# Patient Record
Sex: Female | Born: 1950 | Race: White | Hispanic: No | Smoking: Never smoker
Health system: Southern US, Community
[De-identification: ages and names within clinical notes are randomized; demographics above are authoritative.]

## PROBLEM LIST (undated history)

## (undated) DIAGNOSIS — R5381 Other malaise: Secondary | ICD-10-CM

## (undated) DIAGNOSIS — R112 Nausea with vomiting, unspecified: Secondary | ICD-10-CM

## (undated) DIAGNOSIS — G8929 Other chronic pain: Secondary | ICD-10-CM

## (undated) DIAGNOSIS — R5383 Other fatigue: Secondary | ICD-10-CM

## (undated) DIAGNOSIS — M545 Low back pain, unspecified: Secondary | ICD-10-CM

## (undated) DIAGNOSIS — Z9289 Personal history of other medical treatment: Secondary | ICD-10-CM

## (undated) DIAGNOSIS — T4145XA Adverse effect of unspecified anesthetic, initial encounter: Secondary | ICD-10-CM

## (undated) DIAGNOSIS — F325 Major depressive disorder, single episode, in full remission: Secondary | ICD-10-CM

## (undated) DIAGNOSIS — T8859XA Other complications of anesthesia, initial encounter: Secondary | ICD-10-CM

## (undated) DIAGNOSIS — J449 Chronic obstructive pulmonary disease, unspecified: Secondary | ICD-10-CM

## (undated) DIAGNOSIS — E782 Mixed hyperlipidemia: Secondary | ICD-10-CM

## (undated) DIAGNOSIS — R4182 Altered mental status, unspecified: Secondary | ICD-10-CM

## (undated) DIAGNOSIS — Z9889 Other specified postprocedural states: Secondary | ICD-10-CM

## (undated) DIAGNOSIS — R42 Dizziness and giddiness: Secondary | ICD-10-CM

## (undated) DIAGNOSIS — M797 Fibromyalgia: Secondary | ICD-10-CM

## (undated) DIAGNOSIS — K3184 Gastroparesis: Secondary | ICD-10-CM

## (undated) DIAGNOSIS — D131 Benign neoplasm of stomach: Secondary | ICD-10-CM

## (undated) DIAGNOSIS — F419 Anxiety disorder, unspecified: Secondary | ICD-10-CM

## (undated) DIAGNOSIS — N189 Chronic kidney disease, unspecified: Secondary | ICD-10-CM

## (undated) DIAGNOSIS — H3530 Unspecified macular degeneration: Secondary | ICD-10-CM

## (undated) DIAGNOSIS — Z8673 Personal history of transient ischemic attack (TIA), and cerebral infarction without residual deficits: Secondary | ICD-10-CM

## (undated) DIAGNOSIS — R51 Headache: Secondary | ICD-10-CM

## (undated) DIAGNOSIS — Z8489 Family history of other specified conditions: Secondary | ICD-10-CM

## (undated) DIAGNOSIS — K219 Gastro-esophageal reflux disease without esophagitis: Secondary | ICD-10-CM

## (undated) DIAGNOSIS — D649 Anemia, unspecified: Secondary | ICD-10-CM

## (undated) DIAGNOSIS — I2510 Atherosclerotic heart disease of native coronary artery without angina pectoris: Secondary | ICD-10-CM

## (undated) DIAGNOSIS — N3946 Mixed incontinence: Secondary | ICD-10-CM

## (undated) DIAGNOSIS — I350 Nonrheumatic aortic (valve) stenosis: Secondary | ICD-10-CM

## (undated) DIAGNOSIS — G4733 Obstructive sleep apnea (adult) (pediatric): Secondary | ICD-10-CM

## (undated) DIAGNOSIS — I6529 Occlusion and stenosis of unspecified carotid artery: Secondary | ICD-10-CM

## (undated) DIAGNOSIS — E669 Obesity, unspecified: Secondary | ICD-10-CM

## (undated) DIAGNOSIS — I509 Heart failure, unspecified: Secondary | ICD-10-CM

## (undated) DIAGNOSIS — C569 Malignant neoplasm of unspecified ovary: Secondary | ICD-10-CM

## (undated) DIAGNOSIS — E119 Type 2 diabetes mellitus without complications: Secondary | ICD-10-CM

## (undated) HISTORY — DX: Altered mental status, unspecified: R41.82

## (undated) HISTORY — PX: ANTERIOR AND POSTERIOR VAGINAL REPAIR: SUR5

## (undated) HISTORY — DX: Gastroparesis: K31.84

## (undated) HISTORY — DX: Nonrheumatic aortic (valve) stenosis: I35.0

## (undated) HISTORY — DX: Other malaise: R53.81

## (undated) HISTORY — PX: TUBAL LIGATION: SHX77

## (undated) HISTORY — DX: Headache: R51

## (undated) HISTORY — DX: Benign neoplasm of stomach: D13.1

## (undated) HISTORY — PX: BAND HEMORRHOIDECTOMY: SHX1213

## (undated) HISTORY — PX: ABDOMINAL HYSTERECTOMY: SHX81

## (undated) HISTORY — PX: CHOLECYSTECTOMY OPEN: SUR202

## (undated) HISTORY — DX: Major depressive disorder, single episode, in full remission: F32.5

## (undated) HISTORY — DX: Occlusion and stenosis of unspecified carotid artery: I65.29

## (undated) HISTORY — DX: Mixed hyperlipidemia: E78.2

## (undated) HISTORY — PX: OOPHORECTOMY: SHX86

## (undated) HISTORY — PX: TONSILLECTOMY: SUR1361

## (undated) HISTORY — DX: Obesity, unspecified: E66.9

## (undated) HISTORY — DX: Mixed incontinence: N39.46

## (undated) HISTORY — PX: ESOPHAGOGASTRODUODENOSCOPY (EGD) WITH ESOPHAGEAL DILATION: SHX5812

## (undated) HISTORY — DX: Other chronic pain: G89.29

## (undated) HISTORY — DX: Unspecified macular degeneration: H35.30

## (undated) HISTORY — PX: APPENDECTOMY: SHX54

## (undated) HISTORY — DX: Dizziness and giddiness: R42

## (undated) HISTORY — DX: Malignant neoplasm of unspecified ovary: C56.9

## (undated) HISTORY — DX: Gastro-esophageal reflux disease without esophagitis: K21.9

## (undated) HISTORY — PX: BACK SURGERY: SHX140

## (undated) HISTORY — DX: Other fatigue: R53.83

## (undated) HISTORY — DX: Obstructive sleep apnea (adult) (pediatric): G47.33

## (undated) HISTORY — DX: Personal history of transient ischemic attack (TIA), and cerebral infarction without residual deficits: Z86.73

## (undated) HISTORY — DX: Atherosclerotic heart disease of native coronary artery without angina pectoris: I25.10

## (undated) HISTORY — PX: LUMBAR DISC SURGERY: SHX700

## (undated) HISTORY — PX: DILATION AND CURETTAGE OF UTERUS: SHX78

## (undated) HISTORY — PX: KNEE ARTHROSCOPY: SUR90

---
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
## 1998-09-13 ENCOUNTER — Encounter: Payer: Self-pay | Admitting: Emergency Medicine

## 1998-09-13 ENCOUNTER — Emergency Department (HOSPITAL_COMMUNITY): Admission: EM | Admit: 1998-09-13 | Discharge: 1998-09-13 | Payer: Self-pay | Admitting: Emergency Medicine

## 1999-02-09 ENCOUNTER — Encounter: Payer: Self-pay | Admitting: Internal Medicine

## 1999-02-09 ENCOUNTER — Emergency Department (HOSPITAL_COMMUNITY): Admission: EM | Admit: 1999-02-09 | Discharge: 1999-02-09 | Payer: Self-pay | Admitting: Internal Medicine

## 2000-03-21 ENCOUNTER — Encounter: Admission: RE | Admit: 2000-03-21 | Discharge: 2000-03-21 | Payer: Self-pay | Admitting: Internal Medicine

## 2000-04-13 ENCOUNTER — Emergency Department (HOSPITAL_COMMUNITY): Admission: EM | Admit: 2000-04-13 | Discharge: 2000-04-13 | Payer: Self-pay | Admitting: Emergency Medicine

## 2000-05-01 ENCOUNTER — Encounter: Admission: RE | Admit: 2000-05-01 | Discharge: 2000-05-01 | Payer: Self-pay | Admitting: Internal Medicine

## 2000-06-07 ENCOUNTER — Encounter: Payer: Self-pay | Admitting: *Deleted

## 2000-06-07 ENCOUNTER — Ambulatory Visit (HOSPITAL_COMMUNITY): Admission: RE | Admit: 2000-06-07 | Discharge: 2000-06-07 | Payer: Self-pay | Admitting: *Deleted

## 2001-06-14 ENCOUNTER — Emergency Department (HOSPITAL_COMMUNITY): Admission: EM | Admit: 2001-06-14 | Discharge: 2001-06-14 | Payer: Self-pay | Admitting: Emergency Medicine

## 2001-06-14 ENCOUNTER — Encounter: Payer: Self-pay | Admitting: Emergency Medicine

## 2001-09-23 ENCOUNTER — Ambulatory Visit (HOSPITAL_COMMUNITY): Admission: RE | Admit: 2001-09-23 | Discharge: 2001-09-23 | Payer: Self-pay | Admitting: Internal Medicine

## 2001-09-23 ENCOUNTER — Encounter: Payer: Self-pay | Admitting: Internal Medicine

## 2001-10-22 ENCOUNTER — Ambulatory Visit (HOSPITAL_COMMUNITY): Admission: RE | Admit: 2001-10-22 | Discharge: 2001-10-22 | Payer: Self-pay | Admitting: Internal Medicine

## 2001-10-22 ENCOUNTER — Encounter: Payer: Self-pay | Admitting: Internal Medicine

## 2002-01-02 ENCOUNTER — Emergency Department (HOSPITAL_COMMUNITY): Admission: EM | Admit: 2002-01-02 | Discharge: 2002-01-02 | Payer: Self-pay | Admitting: Emergency Medicine

## 2002-02-27 ENCOUNTER — Other Ambulatory Visit: Admission: RE | Admit: 2002-02-27 | Discharge: 2002-02-27 | Payer: Self-pay | Admitting: Internal Medicine

## 2002-03-19 ENCOUNTER — Ambulatory Visit (HOSPITAL_COMMUNITY): Admission: RE | Admit: 2002-03-19 | Discharge: 2002-03-19 | Payer: Self-pay | Admitting: Internal Medicine

## 2002-03-19 ENCOUNTER — Encounter: Payer: Self-pay | Admitting: Internal Medicine

## 2002-03-26 ENCOUNTER — Encounter: Payer: Self-pay | Admitting: Internal Medicine

## 2002-03-26 ENCOUNTER — Ambulatory Visit (HOSPITAL_COMMUNITY): Admission: RE | Admit: 2002-03-26 | Discharge: 2002-03-26 | Payer: Self-pay | Admitting: Internal Medicine

## 2002-04-10 ENCOUNTER — Ambulatory Visit (HOSPITAL_COMMUNITY): Admission: RE | Admit: 2002-04-10 | Discharge: 2002-04-10 | Payer: Self-pay | Admitting: Internal Medicine

## 2002-04-10 ENCOUNTER — Encounter: Payer: Self-pay | Admitting: Internal Medicine

## 2002-05-18 ENCOUNTER — Emergency Department (HOSPITAL_COMMUNITY): Admission: EM | Admit: 2002-05-18 | Discharge: 2002-05-19 | Payer: Self-pay | Admitting: Emergency Medicine

## 2002-09-03 ENCOUNTER — Encounter: Admission: RE | Admit: 2002-09-03 | Discharge: 2002-11-07 | Payer: Self-pay | Admitting: Internal Medicine

## 2002-09-08 ENCOUNTER — Encounter: Payer: Self-pay | Admitting: Internal Medicine

## 2002-09-08 ENCOUNTER — Ambulatory Visit (HOSPITAL_COMMUNITY): Admission: RE | Admit: 2002-09-08 | Discharge: 2002-09-08 | Payer: Self-pay

## 2003-03-22 ENCOUNTER — Emergency Department (HOSPITAL_COMMUNITY): Admission: EM | Admit: 2003-03-22 | Discharge: 2003-03-22 | Payer: Self-pay | Admitting: Emergency Medicine

## 2003-06-10 ENCOUNTER — Ambulatory Visit (HOSPITAL_COMMUNITY): Admission: RE | Admit: 2003-06-10 | Discharge: 2003-06-10 | Payer: Self-pay | Admitting: Family Medicine

## 2003-09-08 ENCOUNTER — Observation Stay (HOSPITAL_COMMUNITY): Admission: RE | Admit: 2003-09-08 | Discharge: 2003-09-09 | Payer: Self-pay | Admitting: Cardiology

## 2003-10-20 ENCOUNTER — Encounter: Payer: Self-pay | Admitting: Gastroenterology

## 2003-11-22 ENCOUNTER — Emergency Department (HOSPITAL_COMMUNITY): Admission: EM | Admit: 2003-11-22 | Discharge: 2003-11-22 | Payer: Self-pay | Admitting: Emergency Medicine

## 2004-04-21 ENCOUNTER — Ambulatory Visit (HOSPITAL_COMMUNITY): Admission: RE | Admit: 2004-04-21 | Discharge: 2004-04-21 | Payer: Self-pay | Admitting: Internal Medicine

## 2004-05-05 ENCOUNTER — Ambulatory Visit: Payer: Self-pay | Admitting: *Deleted

## 2004-05-26 ENCOUNTER — Ambulatory Visit: Payer: Self-pay | Admitting: Internal Medicine

## 2004-07-06 ENCOUNTER — Ambulatory Visit: Payer: Self-pay | Admitting: Internal Medicine

## 2004-07-11 ENCOUNTER — Ambulatory Visit: Payer: Self-pay | Admitting: Nurse Practitioner

## 2004-08-01 ENCOUNTER — Ambulatory Visit: Payer: Self-pay | Admitting: Internal Medicine

## 2004-08-08 ENCOUNTER — Ambulatory Visit: Payer: Self-pay | Admitting: Internal Medicine

## 2004-08-23 ENCOUNTER — Ambulatory Visit: Payer: Self-pay | Admitting: Cardiology

## 2004-08-25 ENCOUNTER — Ambulatory Visit (HOSPITAL_COMMUNITY): Admission: RE | Admit: 2004-08-25 | Discharge: 2004-08-25 | Payer: Self-pay | Admitting: Internal Medicine

## 2004-09-08 ENCOUNTER — Ambulatory Visit: Payer: Self-pay | Admitting: Internal Medicine

## 2004-09-26 ENCOUNTER — Ambulatory Visit: Payer: Self-pay | Admitting: Gastroenterology

## 2004-09-27 ENCOUNTER — Ambulatory Visit: Payer: Self-pay | Admitting: Gastroenterology

## 2004-10-04 ENCOUNTER — Ambulatory Visit: Payer: Self-pay | Admitting: Gastroenterology

## 2004-10-25 ENCOUNTER — Ambulatory Visit: Payer: Self-pay | Admitting: Family Medicine

## 2004-11-15 ENCOUNTER — Ambulatory Visit: Payer: Self-pay | Admitting: Gastroenterology

## 2005-02-06 ENCOUNTER — Ambulatory Visit: Payer: Self-pay | Admitting: Internal Medicine

## 2005-02-15 ENCOUNTER — Ambulatory Visit (HOSPITAL_COMMUNITY): Admission: RE | Admit: 2005-02-15 | Discharge: 2005-02-15 | Payer: Self-pay | Admitting: Internal Medicine

## 2005-02-27 ENCOUNTER — Ambulatory Visit: Payer: Self-pay | Admitting: Internal Medicine

## 2005-03-01 ENCOUNTER — Ambulatory Visit (HOSPITAL_COMMUNITY): Admission: RE | Admit: 2005-03-01 | Discharge: 2005-03-01 | Payer: Self-pay | Admitting: Internal Medicine

## 2005-03-20 ENCOUNTER — Ambulatory Visit: Payer: Self-pay | Admitting: Internal Medicine

## 2005-06-01 ENCOUNTER — Ambulatory Visit: Payer: Self-pay | Admitting: Internal Medicine

## 2005-06-01 ENCOUNTER — Ambulatory Visit: Payer: Self-pay

## 2005-06-26 ENCOUNTER — Ambulatory Visit: Payer: Self-pay | Admitting: Internal Medicine

## 2005-07-06 ENCOUNTER — Ambulatory Visit: Payer: Self-pay | Admitting: Gastroenterology

## 2005-07-07 ENCOUNTER — Ambulatory Visit: Payer: Self-pay | Admitting: Internal Medicine

## 2005-07-17 ENCOUNTER — Ambulatory Visit: Payer: Self-pay | Admitting: Internal Medicine

## 2005-09-02 ENCOUNTER — Ambulatory Visit: Payer: Self-pay | Admitting: Internal Medicine

## 2005-09-18 ENCOUNTER — Ambulatory Visit: Payer: Self-pay | Admitting: Internal Medicine

## 2005-09-25 ENCOUNTER — Ambulatory Visit: Payer: Self-pay | Admitting: Gastroenterology

## 2005-10-02 ENCOUNTER — Ambulatory Visit (HOSPITAL_COMMUNITY): Admission: RE | Admit: 2005-10-02 | Discharge: 2005-10-02 | Payer: Self-pay | Admitting: Gastroenterology

## 2005-10-02 ENCOUNTER — Ambulatory Visit: Payer: Self-pay | Admitting: Internal Medicine

## 2005-10-05 ENCOUNTER — Ambulatory Visit: Payer: Self-pay | Admitting: Internal Medicine

## 2005-10-08 ENCOUNTER — Emergency Department (HOSPITAL_COMMUNITY): Admission: EM | Admit: 2005-10-08 | Discharge: 2005-10-08 | Payer: Self-pay | Admitting: Emergency Medicine

## 2005-11-06 ENCOUNTER — Ambulatory Visit: Payer: Self-pay | Admitting: Gastroenterology

## 2005-12-06 ENCOUNTER — Ambulatory Visit: Payer: Self-pay | Admitting: Gastroenterology

## 2005-12-13 ENCOUNTER — Ambulatory Visit: Payer: Self-pay | Admitting: Gastroenterology

## 2005-12-13 ENCOUNTER — Encounter (INDEPENDENT_AMBULATORY_CARE_PROVIDER_SITE_OTHER): Payer: Self-pay | Admitting: Specialist

## 2006-01-12 ENCOUNTER — Ambulatory Visit: Payer: Self-pay | Admitting: Gastroenterology

## 2006-03-19 ENCOUNTER — Ambulatory Visit: Payer: Self-pay | Admitting: Internal Medicine

## 2006-05-28 ENCOUNTER — Ambulatory Visit: Payer: Self-pay | Admitting: Internal Medicine

## 2006-06-01 ENCOUNTER — Ambulatory Visit: Payer: Self-pay | Admitting: Internal Medicine

## 2006-07-17 ENCOUNTER — Ambulatory Visit: Payer: Self-pay | Admitting: Gastroenterology

## 2006-07-24 ENCOUNTER — Ambulatory Visit: Payer: Self-pay | Admitting: Internal Medicine

## 2006-08-08 ENCOUNTER — Ambulatory Visit: Payer: Self-pay | Admitting: Internal Medicine

## 2006-09-06 ENCOUNTER — Ambulatory Visit: Payer: Self-pay | Admitting: Internal Medicine

## 2006-12-13 ENCOUNTER — Ambulatory Visit: Payer: Self-pay | Admitting: Internal Medicine

## 2006-12-17 ENCOUNTER — Ambulatory Visit: Payer: Self-pay | Admitting: Internal Medicine

## 2006-12-17 ENCOUNTER — Ambulatory Visit (HOSPITAL_COMMUNITY): Admission: RE | Admit: 2006-12-17 | Discharge: 2006-12-17 | Payer: Self-pay | Admitting: Internal Medicine

## 2006-12-25 ENCOUNTER — Ambulatory Visit: Payer: Self-pay | Admitting: Internal Medicine

## 2006-12-25 LAB — CONVERTED CEMR LAB
Cholesterol: 159 mg/dL (ref 0–200)
Triglycerides: 218 mg/dL (ref 0–149)

## 2006-12-28 ENCOUNTER — Ambulatory Visit: Payer: Self-pay | Admitting: Internal Medicine

## 2007-01-04 ENCOUNTER — Ambulatory Visit: Payer: Self-pay

## 2007-01-11 ENCOUNTER — Ambulatory Visit (HOSPITAL_COMMUNITY): Admission: RE | Admit: 2007-01-11 | Discharge: 2007-01-11 | Payer: Self-pay | Admitting: Internal Medicine

## 2007-01-16 ENCOUNTER — Ambulatory Visit: Payer: Self-pay | Admitting: Gastroenterology

## 2007-02-28 ENCOUNTER — Ambulatory Visit: Payer: Self-pay | Admitting: Internal Medicine

## 2007-03-04 ENCOUNTER — Ambulatory Visit (HOSPITAL_COMMUNITY): Admission: RE | Admit: 2007-03-04 | Discharge: 2007-03-04 | Payer: Self-pay | Admitting: Internal Medicine

## 2007-03-05 ENCOUNTER — Ambulatory Visit: Payer: Self-pay | Admitting: Internal Medicine

## 2007-03-11 ENCOUNTER — Ambulatory Visit: Payer: Self-pay | Admitting: Internal Medicine

## 2007-03-18 ENCOUNTER — Encounter: Admission: RE | Admit: 2007-03-18 | Discharge: 2007-03-18 | Payer: Self-pay | Admitting: Surgery

## 2007-05-03 ENCOUNTER — Ambulatory Visit (HOSPITAL_COMMUNITY): Admission: RE | Admit: 2007-05-03 | Discharge: 2007-05-03 | Payer: Self-pay | Admitting: Surgery

## 2007-05-06 ENCOUNTER — Ambulatory Visit: Payer: Self-pay | Admitting: Internal Medicine

## 2007-05-20 ENCOUNTER — Ambulatory Visit (HOSPITAL_COMMUNITY): Admission: RE | Admit: 2007-05-20 | Discharge: 2007-05-20 | Payer: Self-pay | Admitting: Surgery

## 2007-05-20 ENCOUNTER — Encounter (INDEPENDENT_AMBULATORY_CARE_PROVIDER_SITE_OTHER): Payer: Self-pay | Admitting: Surgery

## 2007-06-05 ENCOUNTER — Ambulatory Visit: Payer: Self-pay | Admitting: Gastroenterology

## 2007-06-05 LAB — CONVERTED CEMR LAB
ALT: 36 units/L — ABNORMAL HIGH (ref 0–35)
Albumin: 3.5 g/dL (ref 3.5–5.2)
Alkaline Phosphatase: 59 units/L (ref 39–117)
HCV Ab: POSITIVE — AB
Hep B S Ab: NEGATIVE
Total Bilirubin: 0.5 mg/dL (ref 0.3–1.2)
Total Protein: 7.1 g/dL (ref 6.0–8.3)

## 2007-06-21 ENCOUNTER — Ambulatory Visit: Payer: Self-pay | Admitting: Gastroenterology

## 2007-08-12 ENCOUNTER — Ambulatory Visit: Payer: Self-pay | Admitting: Internal Medicine

## 2007-08-12 LAB — CONVERTED CEMR LAB
AST: 79 units/L — ABNORMAL HIGH (ref 0–37)
Cholesterol: 167 mg/dL (ref 0–200)
Triglycerides: 191 mg/dL — ABNORMAL HIGH (ref 0–149)
VLDL: 38 mg/dL (ref 0–40)

## 2007-09-02 ENCOUNTER — Ambulatory Visit: Payer: Self-pay | Admitting: Internal Medicine

## 2007-09-11 ENCOUNTER — Ambulatory Visit: Payer: Self-pay | Admitting: Gastroenterology

## 2007-09-11 LAB — CONVERTED CEMR LAB: Creatinine, Ser: 0.7 mg/dL (ref 0.4–1.2)

## 2007-09-16 ENCOUNTER — Ambulatory Visit: Payer: Self-pay | Admitting: Internal Medicine

## 2007-09-20 ENCOUNTER — Ambulatory Visit (HOSPITAL_COMMUNITY): Admission: RE | Admit: 2007-09-20 | Discharge: 2007-09-20 | Payer: Self-pay | Admitting: Gastroenterology

## 2007-10-02 ENCOUNTER — Ambulatory Visit: Payer: Self-pay | Admitting: Gastroenterology

## 2007-10-02 DIAGNOSIS — K3184 Gastroparesis: Secondary | ICD-10-CM | POA: Insufficient documentation

## 2007-10-02 DIAGNOSIS — K219 Gastro-esophageal reflux disease without esophagitis: Secondary | ICD-10-CM | POA: Insufficient documentation

## 2007-10-02 DIAGNOSIS — B182 Chronic viral hepatitis C: Secondary | ICD-10-CM | POA: Insufficient documentation

## 2007-10-02 DIAGNOSIS — R1310 Dysphagia, unspecified: Secondary | ICD-10-CM | POA: Insufficient documentation

## 2007-10-03 ENCOUNTER — Ambulatory Visit: Payer: Self-pay | Admitting: Internal Medicine

## 2007-10-03 LAB — CONVERTED CEMR LAB
Albumin: 4.4 g/dL (ref 3.5–5.2)
Alkaline Phosphatase: 73 units/L (ref 39–117)
CO2: 26 meq/L (ref 19–32)
Chloride: 103 meq/L (ref 96–112)
Cholesterol: 170 mg/dL (ref 0–200)
Creatinine, Ser: 0.76 mg/dL (ref 0.40–1.20)
Eosinophils Absolute: 0.2 10*3/uL (ref 0.0–0.7)
HDL: 37 mg/dL — ABNORMAL LOW (ref >39)
LDL Cholesterol: 89 mg/dL (ref 0–99)
Lymphocytes Relative: 30 % (ref 12–46)
MCHC: 32.1 g/dL (ref 30.0–36.0)
Monocytes Absolute: 0.4 10*3/uL (ref 0.1–1.0)
Monocytes Relative: 7 % (ref 3–12)
Sodium: 143 meq/L (ref 135–145)
Total CHOL/HDL Ratio: 4.6
Triglycerides: 220 mg/dL — ABNORMAL HIGH (ref <150)
VLDL: 44 mg/dL — ABNORMAL HIGH (ref 0–40)
WBC: 5.8 10*3/uL (ref 4.0–10.5)

## 2007-10-08 ENCOUNTER — Ambulatory Visit: Payer: Self-pay | Admitting: Internal Medicine

## 2007-10-08 LAB — CONVERTED CEMR LAB
Chlamydia, Swab/Urine, PCR: NEGATIVE
GC Probe Amp, Urine: NEGATIVE

## 2007-10-09 ENCOUNTER — Encounter: Payer: Self-pay | Admitting: Gastroenterology

## 2007-10-09 ENCOUNTER — Ambulatory Visit: Payer: Self-pay | Admitting: Gastroenterology

## 2007-10-28 ENCOUNTER — Emergency Department (HOSPITAL_COMMUNITY): Admission: EM | Admit: 2007-10-28 | Discharge: 2007-10-29 | Payer: Self-pay | Admitting: Emergency Medicine

## 2007-11-20 ENCOUNTER — Ambulatory Visit: Payer: Self-pay | Admitting: Gastroenterology

## 2008-01-08 ENCOUNTER — Telehealth: Payer: Self-pay | Admitting: Gastroenterology

## 2008-01-28 ENCOUNTER — Ambulatory Visit: Payer: Self-pay | Admitting: Internal Medicine

## 2008-02-04 ENCOUNTER — Ambulatory Visit (HOSPITAL_COMMUNITY): Admission: RE | Admit: 2008-02-04 | Discharge: 2008-02-04 | Payer: Self-pay | Admitting: Family Medicine

## 2008-02-25 ENCOUNTER — Telehealth: Payer: Self-pay | Admitting: Gastroenterology

## 2008-02-27 ENCOUNTER — Ambulatory Visit (HOSPITAL_COMMUNITY): Admission: RE | Admit: 2008-02-27 | Discharge: 2008-02-27 | Payer: Self-pay | Admitting: Internal Medicine

## 2008-02-27 ENCOUNTER — Ambulatory Visit: Payer: Self-pay | Admitting: Internal Medicine

## 2008-03-09 ENCOUNTER — Ambulatory Visit: Payer: Self-pay

## 2008-03-20 ENCOUNTER — Ambulatory Visit: Payer: Self-pay | Admitting: Internal Medicine

## 2008-07-21 ENCOUNTER — Telehealth: Payer: Self-pay | Admitting: Gastroenterology

## 2008-07-27 ENCOUNTER — Ambulatory Visit: Payer: Self-pay | Admitting: Gastroenterology

## 2008-07-27 DIAGNOSIS — R197 Diarrhea, unspecified: Secondary | ICD-10-CM | POA: Insufficient documentation

## 2008-07-27 DIAGNOSIS — R358 Other polyuria: Secondary | ICD-10-CM

## 2008-07-27 DIAGNOSIS — R3589 Other polyuria: Secondary | ICD-10-CM | POA: Insufficient documentation

## 2008-07-27 LAB — CONVERTED CEMR LAB
Bilirubin Urine: NEGATIVE
Crystals: NEGATIVE
Hemoglobin, Urine: NEGATIVE
Ketones, ur: NEGATIVE mg/dL
Leukocytes, UA: NEGATIVE
Mucus, UA: NEGATIVE
Nitrite: NEGATIVE
RBC / HPF: NONE SEEN
Specific Gravity, Urine: <=1.005 (ref 1.000–1.03)
Total Protein, Urine: NEGATIVE mg/dL
Urine Glucose: 500 mg/dL — AB
Urobilinogen, UA: 0.2 (ref 0.0–1.0)
pH: 7 (ref 5.0–8.0)

## 2008-07-31 ENCOUNTER — Ambulatory Visit: Payer: Self-pay | Admitting: Internal Medicine

## 2008-08-03 ENCOUNTER — Telehealth: Payer: Self-pay | Admitting: Gastroenterology

## 2008-08-06 ENCOUNTER — Telehealth: Payer: Self-pay | Admitting: Gastroenterology

## 2008-08-07 ENCOUNTER — Ambulatory Visit: Payer: Self-pay | Admitting: Internal Medicine

## 2008-08-24 ENCOUNTER — Telehealth: Payer: Self-pay | Admitting: Gastroenterology

## 2008-08-25 ENCOUNTER — Ambulatory Visit: Payer: Self-pay | Admitting: Internal Medicine

## 2008-08-25 DIAGNOSIS — B370 Candidal stomatitis: Secondary | ICD-10-CM | POA: Insufficient documentation

## 2008-09-14 ENCOUNTER — Ambulatory Visit: Payer: Self-pay | Admitting: Gastroenterology

## 2008-09-15 ENCOUNTER — Telehealth: Payer: Self-pay | Admitting: Gastroenterology

## 2008-09-17 ENCOUNTER — Telehealth: Payer: Self-pay | Admitting: Gastroenterology

## 2008-09-18 ENCOUNTER — Ambulatory Visit: Payer: Self-pay | Admitting: Internal Medicine

## 2008-09-18 LAB — CONVERTED CEMR LAB
Basophils Relative: 0.6 % (ref 0.0–3.0)
Chloride: 106 meq/L (ref 96–112)
Cortisol, Plasma: 6 ug/dL
Creatinine, Ser: 0.8 mg/dL (ref 0.4–1.2)
Eosinophils Absolute: 0.2 10*3/uL (ref 0.0–0.7)
HCT: 37.9 % (ref 36.0–46.0)
Hemoglobin: 13.1 g/dL (ref 12.0–15.0)
MCV: 88.7 fL (ref 78.0–100.0)
Monocytes Relative: 7.3 % (ref 3.0–12.0)
Neutro Abs: 3.1 10*3/uL (ref 1.4–7.7)
Sodium: 140 meq/L (ref 135–145)
TSH: 1.12 microintl units/mL (ref 0.35–5.50)

## 2008-10-21 ENCOUNTER — Ambulatory Visit: Payer: Self-pay | Admitting: Internal Medicine

## 2008-10-23 ENCOUNTER — Ambulatory Visit: Payer: Self-pay | Admitting: Internal Medicine

## 2008-11-18 DIAGNOSIS — R42 Dizziness and giddiness: Secondary | ICD-10-CM | POA: Insufficient documentation

## 2008-11-20 ENCOUNTER — Ambulatory Visit: Payer: Self-pay | Admitting: Internal Medicine

## 2008-12-01 ENCOUNTER — Ambulatory Visit (HOSPITAL_BASED_OUTPATIENT_CLINIC_OR_DEPARTMENT_OTHER): Admission: RE | Admit: 2008-12-01 | Discharge: 2008-12-01 | Payer: Self-pay | Admitting: Urology

## 2008-12-02 ENCOUNTER — Ambulatory Visit: Payer: Self-pay | Admitting: Internal Medicine

## 2008-12-21 ENCOUNTER — Encounter: Admission: RE | Admit: 2008-12-21 | Discharge: 2009-03-21 | Payer: Self-pay | Admitting: Internal Medicine

## 2008-12-23 ENCOUNTER — Encounter: Payer: Self-pay | Admitting: Internal Medicine

## 2008-12-28 ENCOUNTER — Ambulatory Visit: Payer: Self-pay | Admitting: Gastroenterology

## 2008-12-29 ENCOUNTER — Ambulatory Visit: Payer: Self-pay | Admitting: Gastroenterology

## 2009-01-21 ENCOUNTER — Telehealth: Payer: Self-pay | Admitting: Internal Medicine

## 2009-01-26 ENCOUNTER — Ambulatory Visit: Payer: Self-pay | Admitting: Internal Medicine

## 2009-02-04 ENCOUNTER — Telehealth: Payer: Self-pay | Admitting: Gastroenterology

## 2009-02-09 ENCOUNTER — Ambulatory Visit (HOSPITAL_COMMUNITY): Admission: RE | Admit: 2009-02-09 | Discharge: 2009-02-09 | Payer: Self-pay | Admitting: Gastroenterology

## 2009-02-14 ENCOUNTER — Emergency Department (HOSPITAL_COMMUNITY): Admission: EM | Admit: 2009-02-14 | Discharge: 2009-02-14 | Payer: Self-pay | Admitting: Emergency Medicine

## 2009-03-08 ENCOUNTER — Encounter: Admission: RE | Admit: 2009-03-08 | Discharge: 2009-03-08 | Payer: Self-pay | Admitting: Internal Medicine

## 2009-03-15 ENCOUNTER — Encounter: Admission: RE | Admit: 2009-03-15 | Discharge: 2009-03-15 | Payer: Self-pay | Admitting: Internal Medicine

## 2009-03-16 ENCOUNTER — Telehealth: Payer: Self-pay | Admitting: Gastroenterology

## 2009-03-25 ENCOUNTER — Telehealth: Payer: Self-pay | Admitting: Internal Medicine

## 2009-03-26 ENCOUNTER — Encounter (INDEPENDENT_AMBULATORY_CARE_PROVIDER_SITE_OTHER): Payer: Self-pay | Admitting: *Deleted

## 2009-03-29 ENCOUNTER — Ambulatory Visit: Payer: Self-pay | Admitting: Internal Medicine

## 2009-03-31 LAB — CONVERTED CEMR LAB
Basophils Absolute: 0 10*3/uL (ref 0.0–0.1)
Basophils Relative: 0.2 % (ref 0.0–3.0)
Eosinophils Absolute: 0.2 10*3/uL (ref 0.0–0.7)
HCT: 38.9 % (ref 36.0–46.0)
Lymphocytes Relative: 32.1 % (ref 12.0–46.0)
Lymphs Abs: 2 10*3/uL (ref 0.7–4.0)
Monocytes Relative: 7.5 % (ref 3.0–12.0)
Neutro Abs: 3.6 10*3/uL (ref 1.4–7.7)
TSH: 0.58 microintl units/mL (ref 0.35–5.50)

## 2009-04-05 ENCOUNTER — Telehealth (INDEPENDENT_AMBULATORY_CARE_PROVIDER_SITE_OTHER): Payer: Self-pay | Admitting: *Deleted

## 2009-04-06 ENCOUNTER — Ambulatory Visit: Payer: Self-pay

## 2009-04-06 ENCOUNTER — Encounter: Payer: Self-pay | Admitting: Internal Medicine

## 2009-04-13 ENCOUNTER — Ambulatory Visit: Payer: Self-pay | Admitting: Gastroenterology

## 2009-04-13 ENCOUNTER — Encounter: Admission: RE | Admit: 2009-04-13 | Discharge: 2009-04-13 | Payer: Self-pay | Admitting: Internal Medicine

## 2009-05-11 ENCOUNTER — Telehealth: Payer: Self-pay | Admitting: Gastroenterology

## 2009-05-11 ENCOUNTER — Telehealth: Payer: Self-pay | Admitting: Internal Medicine

## 2009-05-17 ENCOUNTER — Ambulatory Visit (HOSPITAL_COMMUNITY): Admission: RE | Admit: 2009-05-17 | Discharge: 2009-05-17 | Payer: Self-pay | Admitting: Internal Medicine

## 2009-05-17 ENCOUNTER — Ambulatory Visit: Payer: Self-pay | Admitting: Internal Medicine

## 2009-05-21 ENCOUNTER — Ambulatory Visit: Payer: Self-pay | Admitting: Cardiology

## 2009-05-21 ENCOUNTER — Observation Stay (HOSPITAL_COMMUNITY): Admission: EM | Admit: 2009-05-21 | Discharge: 2009-05-22 | Payer: Self-pay | Admitting: Emergency Medicine

## 2009-05-21 ENCOUNTER — Encounter: Payer: Self-pay | Admitting: Internal Medicine

## 2009-05-22 ENCOUNTER — Encounter (INDEPENDENT_AMBULATORY_CARE_PROVIDER_SITE_OTHER): Payer: Self-pay | Admitting: Internal Medicine

## 2009-05-24 ENCOUNTER — Telehealth: Payer: Self-pay | Admitting: Gastroenterology

## 2009-05-26 ENCOUNTER — Ambulatory Visit: Payer: Self-pay | Admitting: Internal Medicine

## 2009-06-02 ENCOUNTER — Ambulatory Visit: Payer: Self-pay | Admitting: Internal Medicine

## 2009-06-02 LAB — CONVERTED CEMR LAB
ALT: 26 units/L (ref 0–35)
AST: 25 units/L (ref 0–37)
Alkaline Phosphatase: 51 units/L (ref 39–117)
Calcium: 9.3 mg/dL (ref 8.4–10.5)
Chloride: 104 meq/L (ref 96–112)
Creatinine, Ser: 0.88 mg/dL (ref 0.40–1.20)
HDL: 29 mg/dL — ABNORMAL LOW (ref >39)
Pro B Natriuretic peptide (BNP): 15.9 pg/mL (ref 0.0–100.0)
Total Bilirubin: 0.3 mg/dL (ref 0.3–1.2)
Total CHOL/HDL Ratio: 7.6

## 2009-06-03 ENCOUNTER — Ambulatory Visit: Payer: Self-pay | Admitting: Gastroenterology

## 2009-06-07 ENCOUNTER — Ambulatory Visit (HOSPITAL_COMMUNITY): Admission: RE | Admit: 2009-06-07 | Discharge: 2009-06-07 | Payer: Self-pay | Admitting: Internal Medicine

## 2009-06-07 ENCOUNTER — Encounter: Payer: Self-pay | Admitting: Internal Medicine

## 2009-06-10 ENCOUNTER — Ambulatory Visit (HOSPITAL_COMMUNITY): Admission: RE | Admit: 2009-06-10 | Discharge: 2009-06-10 | Payer: Self-pay | Admitting: Internal Medicine

## 2009-06-10 ENCOUNTER — Ambulatory Visit: Payer: Self-pay | Admitting: Vascular Surgery

## 2009-06-10 ENCOUNTER — Encounter: Payer: Self-pay | Admitting: Internal Medicine

## 2009-06-11 ENCOUNTER — Ambulatory Visit: Payer: Self-pay | Admitting: Internal Medicine

## 2009-06-14 ENCOUNTER — Telehealth (INDEPENDENT_AMBULATORY_CARE_PROVIDER_SITE_OTHER): Payer: Self-pay | Admitting: *Deleted

## 2009-06-15 ENCOUNTER — Ambulatory Visit: Payer: Self-pay | Admitting: Internal Medicine

## 2009-06-23 ENCOUNTER — Telehealth: Payer: Self-pay | Admitting: Internal Medicine

## 2009-06-29 ENCOUNTER — Telehealth: Payer: Self-pay | Admitting: Internal Medicine

## 2009-07-23 ENCOUNTER — Telehealth: Payer: Self-pay | Admitting: Gastroenterology

## 2009-07-29 ENCOUNTER — Ambulatory Visit: Payer: Self-pay | Admitting: Internal Medicine

## 2009-07-29 DIAGNOSIS — R7989 Other specified abnormal findings of blood chemistry: Secondary | ICD-10-CM | POA: Insufficient documentation

## 2009-08-13 ENCOUNTER — Telehealth: Payer: Self-pay | Admitting: Internal Medicine

## 2009-08-14 ENCOUNTER — Telehealth: Payer: Self-pay | Admitting: Internal Medicine

## 2009-08-19 ENCOUNTER — Encounter: Payer: Self-pay | Admitting: Internal Medicine

## 2009-08-19 LAB — CONVERTED CEMR LAB
Cholesterol: 173 mg/dL (ref 0–200)
Direct LDL: 63.6 mg/dL
Hgb A1c MFr Bld: 6 % (ref 4.6–6.5)

## 2009-10-04 ENCOUNTER — Telehealth: Payer: Self-pay | Admitting: Gastroenterology

## 2009-10-07 ENCOUNTER — Ambulatory Visit: Payer: Self-pay | Admitting: Gastroenterology

## 2009-10-07 LAB — CONVERTED CEMR LAB
AST: 36 units/L (ref 0–37)
Alkaline Phosphatase: 41 units/L (ref 39–117)
Total Bilirubin: 0.5 mg/dL (ref 0.3–1.2)

## 2009-10-15 ENCOUNTER — Telehealth: Payer: Self-pay | Admitting: Gastroenterology

## 2009-10-18 ENCOUNTER — Telehealth: Payer: Self-pay | Admitting: Gastroenterology

## 2009-11-03 ENCOUNTER — Telehealth: Payer: Self-pay | Admitting: Internal Medicine

## 2009-11-08 ENCOUNTER — Ambulatory Visit: Payer: Self-pay | Admitting: Internal Medicine

## 2009-11-08 DIAGNOSIS — I6529 Occlusion and stenosis of unspecified carotid artery: Secondary | ICD-10-CM

## 2009-11-08 HISTORY — DX: Occlusion and stenosis of unspecified carotid artery: I65.29

## 2009-11-14 ENCOUNTER — Telehealth: Payer: Self-pay | Admitting: Internal Medicine

## 2009-11-18 ENCOUNTER — Ambulatory Visit: Payer: Self-pay | Admitting: Internal Medicine

## 2009-11-18 DIAGNOSIS — E782 Mixed hyperlipidemia: Secondary | ICD-10-CM | POA: Insufficient documentation

## 2009-11-18 HISTORY — DX: Mixed hyperlipidemia: E78.2

## 2009-11-23 ENCOUNTER — Encounter: Admission: RE | Admit: 2009-11-23 | Discharge: 2009-11-23 | Payer: Self-pay | Admitting: Neurology

## 2009-11-24 LAB — CONVERTED CEMR LAB
AST: 52 units/L — ABNORMAL HIGH (ref 0–37)
BUN: 19 mg/dL (ref 6–23)
Calcium: 9.1 mg/dL (ref 8.4–10.5)
GFR calc non Af Amer: 68.43 mL/min (ref >60)
HDL: 33.1 mg/dL — ABNORMAL LOW (ref >39.00)
LDL Cholesterol: 75 mg/dL (ref 0–99)
Potassium: 4.1 meq/L (ref 3.5–5.1)
Sodium: 143 meq/L (ref 135–145)
VLDL: 36.6 mg/dL (ref 0.0–40.0)

## 2009-12-28 ENCOUNTER — Ambulatory Visit: Payer: Self-pay | Admitting: Internal Medicine

## 2009-12-28 LAB — CONVERTED CEMR LAB: GC Probe Amp, Urine: NEGATIVE

## 2010-01-17 ENCOUNTER — Ambulatory Visit: Payer: Self-pay | Admitting: Internal Medicine

## 2010-01-30 ENCOUNTER — Emergency Department (HOSPITAL_COMMUNITY): Admission: EM | Admit: 2010-01-30 | Discharge: 2010-01-30 | Payer: Self-pay | Admitting: Emergency Medicine

## 2010-02-01 ENCOUNTER — Telehealth (INDEPENDENT_AMBULATORY_CARE_PROVIDER_SITE_OTHER): Payer: Self-pay | Admitting: *Deleted

## 2010-02-02 ENCOUNTER — Ambulatory Visit: Payer: Self-pay | Admitting: Family Medicine

## 2010-02-08 ENCOUNTER — Ambulatory Visit: Payer: Self-pay | Admitting: Family Medicine

## 2010-02-08 ENCOUNTER — Ambulatory Visit (HOSPITAL_COMMUNITY): Admission: RE | Admit: 2010-02-08 | Discharge: 2010-02-08 | Payer: Self-pay | Admitting: Family Medicine

## 2010-03-29 ENCOUNTER — Telehealth: Payer: Self-pay | Admitting: Gastroenterology

## 2010-04-04 ENCOUNTER — Ambulatory Visit: Payer: Self-pay | Admitting: Family Medicine

## 2010-04-12 ENCOUNTER — Encounter (INDEPENDENT_AMBULATORY_CARE_PROVIDER_SITE_OTHER): Payer: Self-pay | Admitting: Family Medicine

## 2010-04-12 ENCOUNTER — Ambulatory Visit: Payer: Self-pay | Admitting: Internal Medicine

## 2010-04-22 ENCOUNTER — Encounter: Admission: RE | Admit: 2010-04-22 | Discharge: 2010-04-22 | Payer: Self-pay | Admitting: Internal Medicine

## 2010-05-12 ENCOUNTER — Ambulatory Visit (HOSPITAL_COMMUNITY): Admission: RE | Admit: 2010-05-12 | Discharge: 2010-05-12 | Payer: Self-pay | Admitting: Family Medicine

## 2010-05-30 ENCOUNTER — Telehealth: Payer: Self-pay | Admitting: Gastroenterology

## 2010-06-06 ENCOUNTER — Ambulatory Visit: Payer: Self-pay | Admitting: Internal Medicine

## 2010-06-13 ENCOUNTER — Encounter (INDEPENDENT_AMBULATORY_CARE_PROVIDER_SITE_OTHER): Payer: Self-pay | Admitting: Internal Medicine

## 2010-06-13 LAB — CONVERTED CEMR LAB
ALT: 33 units/L (ref 0–35)
AST: 40 units/L — ABNORMAL HIGH (ref 0–37)
Albumin: 4.7 g/dL (ref 3.5–5.2)
Alkaline Phosphatase: 43 units/L (ref 39–117)
Basophils Absolute: 0 10*3/uL (ref 0.0–0.1)
Eosinophils Absolute: 0.2 10*3/uL (ref 0.0–0.7)
Eosinophils Relative: 4 % (ref 0–5)
Glucose, Bld: 105 mg/dL — ABNORMAL HIGH (ref 70–99)
HCT: 39.8 % (ref 36.0–46.0)
Hgb A1c MFr Bld: 6.2 % — ABNORMAL HIGH (ref <5.7)
LDL Cholesterol: 70 mg/dL (ref 0–99)
Lymphs Abs: 1.4 10*3/uL (ref 0.7–4.0)
MCV: 92.1 fL (ref 78.0–100.0)
Neutrophils Relative %: 55 % (ref 43–77)
Platelets: 199 10*3/uL (ref 150–400)
Potassium: 4.1 meq/L (ref 3.5–5.3)
RDW: 13.6 % (ref 11.5–15.5)
Sodium: 144 meq/L (ref 135–145)
Total Bilirubin: 0.4 mg/dL (ref 0.3–1.2)
Total Protein: 6.7 g/dL (ref 6.0–8.3)
VLDL: 38 mg/dL (ref 0–40)
WBC: 4.4 10*3/uL (ref 4.0–10.5)

## 2010-08-19 ENCOUNTER — Ambulatory Visit: Payer: Self-pay

## 2010-08-19 ENCOUNTER — Encounter: Payer: Self-pay | Admitting: Cardiovascular Disease

## 2010-09-01 ENCOUNTER — Telehealth: Payer: Self-pay | Admitting: Internal Medicine

## 2010-09-01 ENCOUNTER — Ambulatory Visit: Admit: 2010-09-01 | Payer: Self-pay | Admitting: Internal Medicine

## 2010-09-05 ENCOUNTER — Telehealth: Payer: Self-pay | Admitting: Internal Medicine

## 2010-09-05 ENCOUNTER — Encounter: Payer: Self-pay | Admitting: Physician Assistant

## 2010-09-05 ENCOUNTER — Ambulatory Visit
Admission: RE | Admit: 2010-09-05 | Discharge: 2010-09-05 | Payer: Self-pay | Source: Home / Self Care | Attending: Cardiovascular Disease | Admitting: Cardiovascular Disease

## 2010-09-12 ENCOUNTER — Ambulatory Visit: Admission: RE | Admit: 2010-09-12 | Discharge: 2010-09-12 | Payer: Self-pay | Source: Home / Self Care

## 2010-09-12 LAB — HEPATIC FUNCTION PANEL
ALT: 30 U/L (ref 0–35)
AST: 37 U/L (ref 0–37)
Albumin: 4.2 g/dL (ref 3.5–5.2)
Alkaline Phosphatase: 37 U/L — ABNORMAL LOW (ref 39–117)
Bilirubin, Direct: 0.2 mg/dL (ref 0.0–0.3)
Indirect Bilirubin: 0.2 mg/dL — ABNORMAL LOW (ref 0.3–0.9)
Total Bilirubin: 0.4 mg/dL (ref 0.3–1.2)
Total Protein: 7.2 g/dL (ref 6.0–8.3)

## 2010-09-12 LAB — BASIC METABOLIC PANEL
BUN: 17 mg/dL (ref 6–23)
CO2: 27 mEq/L (ref 19–32)
Calcium: 9.2 mg/dL (ref 8.4–10.5)
Chloride: 107 mEq/L (ref 96–112)
Creatinine, Ser: 0.88 mg/dL (ref 0.4–1.2)
GFR calc Af Amer: >60 mL/min (ref >60)
GFR calc non Af Amer: >60 mL/min (ref >60)
Glucose, Bld: 133 mg/dL — ABNORMAL HIGH (ref 70–99)
Potassium: 5.1 mEq/L (ref 3.5–5.1)
Sodium: 139 mEq/L (ref 135–145)

## 2010-09-14 LAB — POCT HEMOGLOBIN-HEMACUE: Hemoglobin: 13.3 g/dL (ref 12.0–15.0)

## 2010-09-14 LAB — GLUCOSE, CAPILLARY: Glucose-Capillary: 106 mg/dL — ABNORMAL HIGH (ref 70–99)

## 2010-09-18 ENCOUNTER — Encounter: Payer: Self-pay | Admitting: Gastroenterology

## 2010-09-19 ENCOUNTER — Encounter: Payer: Self-pay | Admitting: Podiatry

## 2010-09-29 NOTE — Assessment & Plan Note (Signed)
Summary: surgical clearance/jr   Primary Provider:  Donia Guiles, MD   History of Present Illness: Primary Cardiologist:  Dr. Dietrich Pates  [name] [name] is a 60 yo female With a history of minimal CAD by cardiac catheterization in 2005 (40% LAD stenosis) and a nonischemic Myoview study August 2010 with an ejection fraction of 70%.  Her past medical history includes diabetes, hypertension, hyperlipidemia, GERD and esophageal stricture status post dilatation.  She is followed at the [facility] for depression.  She has problems with her right foot and requires surgery.  She presents for surgical clearance.  She denies any chest discomfort or shortness of breath.  She is able to vacuum a room or walk up steps without chest pain or shortness of breath.  She denies orthopnea, PND or pedal edema.  She does have occasional left ankle edema without significant change.  She denies syncope.  She has chronic palpitations without change.  These have improved since she's been on pindolol.  Current Medications (verified): 1)  Paxil Cr 25 Mg Xr24h-Tab (Paroxetine Hcl) .... Take 2 Tablets By Mouth At Bedtime 2)  Tizanidine Hcl 4 Mg Tabs (Tizanidine Hcl) .... As Directed (Take 1/2  Tablet By Mouth Once A Day) 3)  Magnesium 250 Mg Tabs (Magnesium) .[name].. 1 Tablet By Mouth Two Times A Day 4)  B2 100 Mg Tabs (Riboflavin) .... Take 2 Tablets By Mouth Two Times A Day 5)  Vitamin C 500 Mg  Tabs (Ascorbic Acid) .[name].. 1 Tablet By Mouth Once Daily 6)  Cvs Saline Nasal Spray 0.65 % Soln (Saline) .... As Needed 7)  Systane 0.4-0.3 % Soln (Polyethyl Glycol-Propyl Glycol) .... Insert 1 Drop in Each Eye Two Times A Day 8)  Dexilant 60 Mg Cpdr (Dexlansoprazole) .... Take 1 Capsule By Mouth Once A Day 30 Minutes Before Breakfast 9)  Nitrostat 0.4 Mg Subl (Nitroglycerin) .... Take 1 Tablet Under Tongue Every  5 Minutes As Needed X 3 Doses 10)  Aspirin 81 Mg  Tabs (Aspirin) .[name].. 1 By Mouth Once Daily 11)  Promethazine Hcl 12.5 Mg  Tabs (Promethazine Hcl) .... Take 1 Tablet Every 6 Hours As Needed For Nausea. 12)  Pindolol 5 Mg Tabs (Pindolol) .... One Half A Tablet  2 Times Per Day 13)  Lisinopril 20 Mg Tabs (Lisinopril) .... Take One Half  By Mouth Once Daily 14)  Tricor 145 Mg Tabs (Fenofibrate) .... Tablet Every Day At Bedtime 15)  Pravastatin Sodium 80 Mg Tabs (Pravastatin Sodium) .[name].. 1 Tab Once Daily \\par  16)  Premarin 0.625 Mg/gm Crea (Estrogens, Conjugated) .... Apply Once A Week 17)  Miralax  Powd (Polyethylene Glycol 3350) .... Mix One Scoop in Water Two Times A Day 18)  Bentyl 10 Mg Caps (Dicyclomine Hcl) .... Take One Tab Every 6 Hours As Needed 19)  Vesicare 5 Mg Tabs (Solifenacin Succinate) .[name].. 1 Tab Once Daily 20)  Tylenol Pm .... Prn 21)  Hydroxyzine Hcl 25 Mg/ml Soln (Hydroxyzine Hcl) .[name].. 1 -3 Tabs Once Daily 22)  Naprosyn 250 Mg Tabs (Naproxen) .[name].. 1 Tab  Three Times A Day 23)  Amoxicillin 250 Mg Caps (Amoxicillin) .[name].. 1 Tab Three Times A Day Until Finish  Allergies: 1)  ! Wynelle Link Flower Seeds 2)  ! * Fish 3)  Flagyl (Metronidazole) 4)  Reglan (Metoclopramide Hcl) 5)  Sulfamethoxazole (Sulfamethoxazole)  Past History:  Past Medical History: Last updated: 10/07/2009 Current Problems:  DIABETES MELLITUS (ICD-250.00) HYPERURICEMIA (ICD-790.6) HYPERCHOLESTEROLEMIA (ICD-272.0) DYSPNEA ON EXERTION (ICD-786.09) GASTRIC POLYP (ICD-211.1) DYSLIPIDEMIA (  ICD-272.4) DIZZINESS (ICD-780.4) CANDIDIASIS, ORAL (ICD-112.0) POLYURIA (ICD-788.42) DIARRHEA (ICD-787.91) DYSPHAGIA UNSPECIFIED (ICD-787.20) GASTROPARESIS (ICD-536.3) HEPATITIS C, CHRONIC (ICD-070.54) ESOPHAGEAL REFLUX (ICD-530.81) CHEST PAIN UNSPECIFIED (ICD-786.50) Oarian Cancer      Past Surgical History: Reviewed history from 08/25/2008 and no changes required. Cholecystectomy Hysterectomy Tubal Ligation Bladder and Anal tack Breast Biopsy-left removal of fallopian tubes-due to ovarian cancer  Family History: Reviewed history  from 06/03/2009 and no changes required. Spine and Brain Cancer: Father Family History of Breast Cancer:Sister Family History of Diabetes: Brother, Aunts Family History of Esophageal Cancer: Brother Family History of Stomach Cancer: Paternal Grandfather Family History of Colitis/Crohn's: Sister (crohns) Family History of Heart Disease: uncle, aunt, mother, sister, son No FH of Colon Cancer: Family History of Kidney Disease:sister  Social History: Occupation: Disabled Patient has never smoked.  Alcohol Use - not now, admits to in past Daily Caffeine Use-2 cups daily Illicit Drug Use - not now, admits to in past (crack)  Review of Systems       She has some problems with short term memory.  Otherwise, As per  the HPI.  All other systems reviewed and negative.   Vital Signs:  Patient profile:   60 year old female Height:      66 inches Weight:      175 pounds BMI:     28.35 Pulse rate:   80 / minute Resp:     16 per minute BP sitting:   140 / 82  (left arm)  Vitals Entered By: Kem Parkinson (September 05, 2010 1:28 PM)  Physical Exam  General:  Well nourished, well developed in no acute distress HEENT: Normal Neck: No JVD Cardiac:  Normal S1, S2; RRR; no murmur Lungs:  Clear to auscultation bilaterally, no wheezing, rhonchi or rales Abd: Soft, nontender, no hepatomegaly, no bruits Ext: No edema Vascular: No carotid  bruits Skin: Warm and dry MSK:  No deformities Lymph: No adenopathy Endocrine:  No thyromegaly Neuro: CNs 2-12 intact; nonfocal    EKG  Procedure date:  09/05/2010  Findings:      normal sinus rhythm Heart rate 79 Normal axis Nonspecific ST-T wave changes  Impression & Recommendations:  Problem # 1:  PRE-OPERATIVE CARDIOVASCULAR EXAMINATION (ICD-V72.81) She currently has no unstable cardiac conditions.  She had minimal CAD at cardiac catheterization in 2005.  She had a nonischemic nuclear stress test in 2010.  She is able to achieve 4 METS or  greater without chest pain or shortness of breath.  She does not have any symptoms of congestive heart failure.  She is currently in normal sinus rhythm.  According to [facility] and AHA guidelines, she requires no further cardiac workup prior to her noncardiac surgery.  She should be acceptable risk.  Our service is available in the perioperative period as necessary.  Problem # 2:  CAD, NATIVE VESSEL (ICD-414.01) Minimal by cath in the past.  No anginal symptoms. Continue ASA.  Problem # 3:  ESSENTIAL HYPERTENSION, BENIGN (ICD-401.1) Borderline control. Continue current medications.  Problem # 4:  MIXED HYPERLIPIDEMIA (ICD-272.2)  Her updated medication list for this problem includes:    Tricor 145 Mg Tabs (Fenofibrate) .[name]... Tablet every day at bedtime    Pravastatin Sodium 80 Mg Tabs (Pravastatin sodium) .[name]... 1 tab once daily \  Problem # 5:  DIABETES MELLITUS (ICD-250.00) F/u with PCP.  Patient Instructions: 1)  Your physician recommends that you schedule a follow-up appointment in: May 2012 with Dr. Tenny Craw 2)  Your physician recommends that you  continue on your current medications as directed. Please refer to the Current Medication list given to you today.

## 2010-09-29 NOTE — Progress Notes (Signed)
Summary: surgical clearance  Phone Note Call from Patient Call back at Home Phone [phone] Call back at Work Phone [phone]   Caller: Patient Reason for Call: Talk to Nurse Summary of Call: pt needs surgical  clearance before 09/12/2010. pt would like to talk to a nurse. Initial call taken by: Roe Coombs,  September 01, 2010 12:59 PM  Follow-up for Phone Call        Phone Call Completed PER PT NEEDS TO HAVE 3 TOES OPERATED ON R FOOT  NEEDING TO HAVE BONE REMOVED AND PIN INSERTED  TO FIX HAMMER TOES.THOUGHT APPT WAS ON FRI WAS CONFUSED ON DAYS PER PT . Follow-up by: Scherrie Bateman, LPN,  September 01, 2010 4:05 PM  Additional Follow-up for Phone Call Additional follow up Details #1::        [facility] for call back to schedule possible appointment for Monday for pre op clearance.  Layne Benton, RN, BSN  September 02, 2010 11:20 AM     Additional Follow-up for Phone Call Additional follow up Details #2::    Patient seen in May 2011.  Has history of minimal CAD.  Normal myoview in the Fall 2010.  If no change in symptoms (no increased CP) I think she is a low risk for sugery (for major cardiac event) and OK to proceed without furhter testing.  Surg is low risk. Follow-up by: Sherrill Raring, MD, [facility],  September 03, 2010 3:04 PM

## 2010-09-29 NOTE — Progress Notes (Signed)
Summary: forgot time was offered today with ross  Phone Note Call from Patient   Caller: Patient [phone] Summary of Call: pt got a call friday re an appt she was told she could have  with dr Tenny Craw today but she forgot the time Initial call taken by: Glynda Jaeger,  September 05, 2010 8:18 AM  Follow-up for Phone Call        PT RETURNING Orie Rout BACK AT [phone] [facility]  September 05, 2010 9:37 AM  Additional Follow-up for Phone Call Additional follow up Details #1::        Called patient and she stated that her primary care doctor advised that she see cardiology for pre op clearance. Will add on to [facility].  today at 130 pm Layne Benton, RN, BSN  September 05, 2010 10:41 AM

## 2010-09-29 NOTE — Progress Notes (Signed)
Summary: TRIAGE  Phone Note Call from Patient Call back at Home Phone [phone] Call back at [phone]   Caller: Patient Call For: Dr. Arlyce Dice Reason for Call: Talk to Nurse Summary of Call: pt reports cramping every day and wants to know "if it's supposed to continue like this" Initial call taken by: Vallarie Mare,  October 04, 2009 11:26 AM  Follow-up for Phone Call        Pt. c/o intermittent RUQ pain X 2-3 years, is getting worse. Uses Robinul Forte, 1-3 times daily, "It eases it off, but it doesn't stop it" Has occ. nausea & foods are tasting bad. Denies fever, bloating, constipation, diarrhea, blood, black stools.   1) See Dr.Senna Lape on 10-07-09 at 9:30am 2) Robinul two times a day until appt 3) tylenol/Ibuprofen as needed 4) Heating pad to abdomen as needed. 5) If symptoms become worse call back immediately or go to ER.  Follow-up by: Laureen Ochs LPN,  October 04, 2009 1:07 PM

## 2010-09-29 NOTE — Assessment & Plan Note (Signed)
Summary: add on weak spells apt at 930 am/jr   Visit Type:  Follow-up Primary Provider:  Donia Guiles, MD  CC:  sob weakness chest pressure.  History of Present Illness: Patient is a 60 year old with a history of diabvetes, chest pain, CV disease, dyslipidemia. She presents today complaining of weakness/heaviness in legs with standing.  SHe also notes dizziness but denies syncope.  Has had a couple episodes of chest heavienss.  Current Medications (verified): 1)  Paxil Cr 25 Mg Xr24h-Tab (Paroxetine Hcl) .... Take 2 Tablets By Mouth At Bedtime 2)  Tizanidine Hcl 4 Mg Tabs (Tizanidine Hcl) .... As Directed (Take 1/2  Tablet By Mouth Once A Day) 3)  Magnesium 250 Mg Tabs (Magnesium) .[name].. 1 Tablet By Mouth Two Times A Day 4)  B2 100 Mg Tabs (Riboflavin) .... Take 2 Tablets By Mouth Two Times A Day 5)  Vitamin C 500 Mg  Tabs (Ascorbic Acid) .[name].. 1 Tablet By Mouth Once Daily 6)  Cvs Saline Nasal Spray 0.65 % Soln (Saline) .... As Needed 7)  Systane 0.4-0.3 % Soln (Polyethyl Glycol-Propyl Glycol) .... Insert 1 Drop in Each Eye Two Times A Day 8)  Dexilant 60 Mg Cpdr (Dexlansoprazole) .... Take 1 Capsule By Mouth Once A Day 30 Minutes Before Breakfast 9)  Nitrostat 0.4 Mg Subl (Nitroglycerin) .... Take 1 Tablet Under Tongue Every  5 Minutes As Needed X 3 Doses 10)  Aspirin 81 Mg  Tabs (Aspirin) .[name].. 1 By Mouth Once Daily 11)  Promethazine Hcl 12.5 Mg Tabs (Promethazine Hcl) .... Take 1 Tablet Every 6 Hours As Needed For Nausea. 12)  Pindolol 5 Mg Tabs (Pindolol) .... One Half A Tablet  2 Times Per Day 13)  Nortriptyline Hcl 50 Mg Caps (Nortriptyline Hcl) .... Take 1 Capsule By Mouth At Bedtime 14)  Hydroxyzine Hcl 25 Mg Tabs (Hydroxyzine Hcl) .... Take 1 Tablet By Mouth  Three  Times A Day 15)  Lisinopril 20 Mg Tabs (Lisinopril) .... Take One By Mouth Once Daily 16)  Tricor 145 Mg Tabs (Fenofibrate) .... Tablet Every Day At Bedtime 17)  Pravastatin Sodium 40 Mg Tabs (Pravastatin Sodium) ....  Take One Tablet By Mouth Daily At Bedtime 18)  Premarin 0.625 Mg/gm Crea (Estrogens, Conjugated) .... Apply Once A Week 19)  Excedrin Migraine 250-250-65 Mg Tabs (Aspirin-Acetaminophen-Caffeine) .... Take Two By Mouth Once Daily 20)  Miralax  Powd (Polyethylene Glycol 3350) .... Mix One Scoop in Water Two Times A Day 21)  Bentyl 10 Mg Caps (Dicyclomine Hcl) .... Take One Tab Every 6 Hours As Needed 22)  Vesicare 5 Mg Tabs (Solifenacin Succinate) .[name].. 1 Tab Once Daily 23)  Vitamin B 2 .... 100mg  1 Tab Two Times A Day 24)  Tylenol Pm .... Prn  Allergies (verified): 1)  ! Wynelle Link Flower Seeds 2)  ! * Fish 3)  Flagyl (Metronidazole) 4)  Reglan (Metoclopramide Hcl) 5)  Sulfamethoxazole (Sulfamethoxazole)  Past History:  Past Medical History: Last updated: 10/07/2009 Current Problems:  DIABETES MELLITUS (ICD-250.00) HYPERURICEMIA (ICD-790.6) HYPERCHOLESTEROLEMIA (ICD-272.0) DYSPNEA ON EXERTION (ICD-786.09) GASTRIC POLYP (ICD-211.1) DYSLIPIDEMIA (ICD-272.4) DIZZINESS (ICD-780.4) CANDIDIASIS, ORAL (ICD-112.0) POLYURIA (ICD-788.42) DIARRHEA (ICD-787.91) DYSPHAGIA UNSPECIFIED (ICD-787.20) GASTROPARESIS (ICD-536.3) HEPATITIS C, CHRONIC (ICD-070.54) ESOPHAGEAL REFLUX (ICD-530.81) CHEST PAIN UNSPECIFIED (ICD-786.50) Oarian Cancer      Past Surgical History: Last updated: 08/25/2008 Cholecystectomy Hysterectomy Tubal Ligation Bladder and Anal tack Breast Biopsy-left removal of fallopian tubes-due to ovarian cancer  Social History: Last updated: 06/03/2009 Occupation: Disabled Patient has never smoked.  Alcohol Use -  no past Daily Caffeine Use-2 cups daily Illicit Drug Use - no past  Vital Signs:  Patient profile:   60 year old female Height:      66 inches Weight:      184 pounds BMI:     29.81 Pulse rate:   84 / minute Pulse (ortho):   80 / minute BP sitting:   114 / 70  (left arm) BP standing:   111 / 65 Cuff size:   regular  Vitals Entered By: Burnett Kanaris,  CNA (November 08, 2009 9:21 AM)  Serial Vital Signs/Assessments:  Time      Position  BP       Pulse  Resp  Temp     By 0 min     Lying LA  112/68   78                    Burnett Kanaris, CNA 0 min     Sitting   116/72   80                    Burnett Kanaris, CNA 0 min     Standing  111/65   80                    Burnett Kanaris, CNA      Standing  113/67   80                    Burnett Kanaris, CNA 2 min     Standing  117/68   81                    Burnett Kanaris, CNA  Comments: 0 min pt felt a little dizziness when she sat up- pt continues to have some dizziness while satnding By: Burnett Kanaris, CNA  pt legs feel weak as she continues to stand By: Burnett Kanaris, CNA  2 min pt commented saying she cant do the things she used to do a year ago By: Burnett Kanaris, CNA    Physical Exam  Additional Exam:  Patient is in NAD HEENT:  Normocephalic, atraumatic. EOMI, PERRLA.  Neck: JVP is normal. No thyromegaly. No bruits.  Lungs: clear to auscultation. No rales no wheezes.  Heart: Regular rate and rhythm. Normal S1, S2. No S3.   No significant murmurs. PMI not displaced.  Abdomen:  Supple, nontender. Normal bowel sounds. No masses. No hepatomegaly.  Extremities:   Good distal pulses throughout. No lower extremity edema.  Musculoskeletal :moving all extremities.  Neuro:   alert and oriented x3.    EKG  Procedure date:  11/08/2009  Findings:      NSR>  84 bpm.  IRBBB  Impression & Recommendations:  Problem # 1:  DIZZINESS (ICD-780.4) On exam today she is not orthostatic.  BP is good but for now I have asked her to cut her LIsinopril in 1/2.  F/u in a couple months.  COntinue to drink ample fluids. Orders: EKG w/ Interpretation (93000)  Problem # 2:  HYPERCHOLESTEROLEMIA (ICD-272.0) Lipids in the next couple wks. Her updated medication list for this problem includes:    Tricor 145 Mg Tabs (Fenofibrate) .[name]... Tablet every day at bedtime    Pravastatin Sodium 40 Mg Tabs  (Pravastatin sodium) .[name]... Take one tablet by mouth daily at bedtime  Problem # 3:  CAD, NATIVE VESSEL (ICD-414.01) Minimal by cath.  Continue meds. Her updated medication  list for this problem includes:    Nitrostat 0.4 Mg Subl (Nitroglycerin) .[name]... Take 1 tablet under tongue every  5 minutes as needed x 3 doses    Aspirin 81 Mg Tabs (Aspirin) .[name]... 1 by mouth once daily    Pindolol 5 Mg Tabs (Pindolol) ..... One half a tablet  2 times per day    Lisinopril 20 Mg Tabs (Lisinopril) .[name]... 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 Her updated medication list for this problem includes:    Aspirin 81 Mg Tabs (Aspirin) .[name]... 1 by mouth once daily  Patient Instructions: 1)  Your physician has recommended you make the following change in your medication: decrease lisinopril to 10 mg every day 2)  Your physician recommends that you schedule a follow-up appointment in: change appointment from April to the end of May with Dr.Avalina Benko

## 2010-09-29 NOTE — Progress Notes (Signed)
Summary: not feeling well, sob  Phone Note Call from Patient Call back at [facility] Phone [phone] Call back at Work Phone [phone]   Caller: Patient Reason for Call: Talk to Nurse Details for Reason: Per pt calling, c/o not feeling well, feel like she black- out, sob,  Initial call taken by: Lorne Skeens,  November 03, 2009 10:12 AM  Follow-up for Phone Call        Called patient back...she is complaining of lots of stress in her life and episodes of weak spells and low indurance levels times 1 and 1 half weeks. She states that sometimes when she gets up she feels like she will pass out then after a few minutes she feels OK. She states that she just can't do what she used to do. She has an appointment with mental health today for medication management. Offered her an appointment with another MD today but she declined and wants to wait to see Dr.Tanelle Lanzo on 3/14...added her on for 930 am.    Follow-up by: Suzan Garibaldi RN

## 2010-09-29 NOTE — Progress Notes (Signed)
Summary: Lab results  Phone Note Call from Patient Call back at Home Phone [phone]   Caller: Patient Call For: Dr. Arlyce Dice Reason for Call: Lab or Test Results Summary of Call: Pt is calling about bloodwork results Initial call taken by: Karna Christmas,  October 15, 2009 10:53 AM  Follow-up for Phone Call        Labs reviewed w/pt. Pt. instructed to call back as needed.  Follow-up by: Laureen Ochs LPN,  October 15, 2009 11:18 AM

## 2010-09-29 NOTE — Progress Notes (Signed)
Summary: triage/back and side pain after fall  Phone Note Call from Patient   Caller: Patient Reason for Call: Talk to Nurse Summary of Call: Patient states she fell on Sunday trying to catch a rooster and tripped on a root fracturing her right orbit.Marland KitchenMarland KitchenShe went to the ED and was not complaining of pain in back and side at that time...exam of chest and abd was normal however no xrays in that area.Marland KitchenMarland KitchenPatient states she doesn't know if she has bruising in that area...lives alone and does not have mirror.Marland KitchenMarland KitchenShe denies SOB and states it mainly hurts when she moves...advised patient she may be sore from the fall...but made appt in acute for evaluation if xray is needed...patient states understanding to go back to ED with severe pain.Marland Kitchendifficultly breathing or worsening s/s... Initial call taken by: Conchita Paris,  February 01, 2010 11:58 AM

## 2010-09-29 NOTE — Assessment & Plan Note (Signed)
Summary: 6 month rov/sl   Visit Type:  Follow-up Primary Provider:  Donia Guiles, MD  CC:  occ discomfort in chest area.  History of Present Illness:  Patient is a 60 year old with a history of diabvetes, chest pain, mild CAD,  CV disease, dyslipidemia, orthostasis.  I last saw her in March When I saw her she was not orthostatic but I recommended that she cut back on her lisinopril to 1/2 tab per day. She has done this and says it has helped.  She has more energy, is less dizzy, less chest pains.  Current Medications (verified): 1)  Paxil Cr 25 Mg Xr24h-Tab (Paroxetine Hcl) .... Take 2 Tablets By Mouth At Bedtime 2)  Tizanidine Hcl 4 Mg Tabs (Tizanidine Hcl) .... As Directed (Take 1/2  Tablet By Mouth Once A Day) 3)  Magnesium 250 Mg Tabs (Magnesium) .[name].. 1 Tablet By Mouth Two Times A Day 4)  B2 100 Mg Tabs (Riboflavin) .... Take 2 Tablets By Mouth Two Times A Day 5)  Vitamin C 500 Mg  Tabs (Ascorbic Acid) .[name].. 1 Tablet By Mouth Once Daily 6)  Cvs Saline Nasal Spray 0.65 % Soln (Saline) .... As Needed 7)  Systane 0.4-0.3 % Soln (Polyethyl Glycol-Propyl Glycol) .... Insert 1 Drop in Each Eye Two Times A Day 8)  Dexilant 60 Mg Cpdr (Dexlansoprazole) .... Take 1 Capsule By Mouth Once A Day 30 Minutes Before Breakfast 9)  Nitrostat 0.4 Mg Subl (Nitroglycerin) .... Take 1 Tablet Under Tongue Every  5 Minutes As Needed X 3 Doses 10)  Aspirin 81 Mg  Tabs (Aspirin) .[name].. 1 By Mouth Once Daily 11)  Promethazine Hcl 12.5 Mg Tabs (Promethazine Hcl) .... Take 1 Tablet Every 6 Hours As Needed For Nausea. 12)  Pindolol 5 Mg Tabs (Pindolol) .... One Half A Tablet  2 Times Per Day 13)  Lisinopril 20 Mg Tabs (Lisinopril) .... Take One Half  By Mouth Once Daily 14)  Tricor 145 Mg Tabs (Fenofibrate) .... Tablet Every Day At Bedtime 15)  Pravastatin Sodium 80 Mg Tabs (Pravastatin Sodium) .[name].. 1 Tab Once Daily \\par  16)  Premarin 0.625 Mg/gm Crea (Estrogens, Conjugated) .... Apply Once A Week 17)  Miralax   Powd (Polyethylene Glycol 3350) .... Mix One Scoop in Water Two Times A Day 18)  Bentyl 10 Mg Caps (Dicyclomine Hcl) .... Take One Tab Every 6 Hours As Needed 19)  Vesicare 5 Mg Tabs (Solifenacin Succinate) .[name].. 1 Tab Once Daily 20)  Tylenol Pm .... Prn 21)  Hydroxyzine Hcl 25 Mg/ml Soln (Hydroxyzine Hcl) .[name].. 1 -3 Tabs Once Daily  Allergies (verified): 1)  ! Wynelle Link Flower Seeds 2)  ! * Fish 3)  Flagyl (Metronidazole) 4)  Reglan (Metoclopramide Hcl) 5)  Sulfamethoxazole (Sulfamethoxazole)  Past History:  Past medical, surgical, family and social histories (including risk factors) reviewed, and no changes noted (except as noted below).  Past Medical History: Reviewed history from 10/07/2009 and no changes required. Current Problems:  DIABETES MELLITUS (ICD-250.00) HYPERURICEMIA (ICD-790.6) HYPERCHOLESTEROLEMIA (ICD-272.0) DYSPNEA ON EXERTION (ICD-786.09) GASTRIC POLYP (ICD-211.1) DYSLIPIDEMIA (ICD-272.4) DIZZINESS (ICD-780.4) CANDIDIASIS, ORAL (ICD-112.0) POLYURIA (ICD-788.42) DIARRHEA (ICD-787.91) DYSPHAGIA UNSPECIFIED (ICD-787.20) GASTROPARESIS (ICD-536.3) HEPATITIS C, CHRONIC (ICD-070.54) ESOPHAGEAL REFLUX (ICD-530.81) CHEST PAIN UNSPECIFIED (ICD-786.50) Oarian Cancer      Past Surgical History: Reviewed history from 08/25/2008 and no changes required. Cholecystectomy Hysterectomy Tubal Ligation Bladder and Anal tack Breast Biopsy-left removal of fallopian tubes-due to ovarian cancer  Family History: Reviewed history from 06/03/2009 and no changes required. Spine and  Brain Cancer: Father Family History of Breast Cancer:Sister Family History of Diabetes: Brother, Aunts Family History of Esophageal Cancer: Brother Family History of Stomach Cancer: Paternal Grandfather Family History of Colitis/Crohn's: Sister (crohns) Family History of Heart Disease: uncle, aunt, mother, sister, son No FH of Colon Cancer: Family History of Kidney Disease:sister  Social  History: Reviewed history from 06/03/2009 and no changes required. Occupation: Disabled Patient has never smoked.  Alcohol Use - no past Daily Caffeine Use-2 cups daily Illicit Drug Use - no past  Vital Signs:  Patient profile:   60 year old female Height:      66 inches Weight:      177 pounds BMI:     28.67 Pulse rate:   76 / minute BP sitting:   120 / 70  (left arm) Cuff size:   regular  Vitals Entered By: Burnett Kanaris, CNA (Jan 17, 2010 9:18 AM)  Physical Exam  Additional Exam:  patient is in NAD HEENT:  Normocephalic, atraumatic. EOMI, PERRLA.  Neck: JVP is normal. No thyromegaly. No bruits.  Lungs: clear to auscultation. No rales no wheezes.  Heart: Regular rate and rhythm. Normal S1, S2. No S3.   No significant murmurs. PMI not displaced.  Abdomen:  Supple, nontender. Normal bowel sounds. No masses. No hepatomegaly.  Extremities:   Good distal pulses throughout. No lower extremity edema.  Musculoskeletal :moving all extremities.  Neuro:   alert and oriented x3.    EKG  Procedure date:  01/17/2010  Findings:      NSR>  78 bpm.  Incomp RBBB  Impression & Recommendations:  Problem # 1:  CAD, NATIVE VESSEL (ICD-414.01) Patient is doing better on less lisinopril.  COntinue meds.  Problem # 2:  ESSENTIAL HYPERTENSION, BENIGN (ICD-401.1) AS above. Her updated medication list for this problem includes:    Aspirin 81 Mg Tabs (Aspirin) .[name]... 1 by mouth once daily    Pindolol 5 Mg Tabs (Pindolol) ..... One half a tablet  2 times per day    Lisinopril 20 Mg Tabs (Lisinopril) .[name]... Take one half  by mouth once daily  Problem # 3:  MIXED HYPERLIPIDEMIA (ICD-272.2) Last lipid in March.  LDL is good.  HDL a little low.  TRIg a little high.  Watch diet.  Stay active. Her updated medication list for this problem includes:    Tricor 145 Mg Tabs (Fenofibrate) .[name]... Tablet every day at bedtime    Pravastatin Sodium 80 Mg Tabs (Pravastatin sodium) .[name]... 1 tab once daily  \ Prescriptions: PRAVASTATIN SODIUM 80 MG TABS (PRAVASTATIN SODIUM) 1 tab once daily  #30 x 11   Entered by:   Layne Benton, RN, BSN   Authorized by:   Sherrill Raring, MD, [facility]   Signed by:   Layne Benton, RN, BSN on 01/17/2010   Method used:   [facility]  [address] [id number]       [address]       [place], [place]  [address]       Ph: [phone]       Fax: [phone]   RxID:   [id number] TRICOR 145 MG TABS (FENOFIBRATE) tablet every day at bedtime  #30 x 11   Entered by:   Layne Benton, RN, BSN   Authorized by:   Sherrill Raring, MD, [facility]   Signed by:   Layne Benton, RN, BSN on 01/17/2010  Method used:   [facility]        [facility]  [address] [facility]       [address]       [place], [place]  [address]       Ph: [phone]       Fax: [phone]   RxID:   [id number] LISINOPRIL 20 MG TABS (LISINOPRIL) take one half  by mouth once daily  #15 x 11   Entered by:   Layne Benton, RN, BSN   Authorized by:   Sherrill Raring, MD, [facility]   Signed by:   Layne Benton, RN, BSN on 01/17/2010   Method used:   [facility]        [facility]  [address] [facility]       [address]       [place], [place]  [address]       Ph: [phone]       Fax: [phone]   RxID:   [id number] PINDOLOL 5 MG TABS (PINDOLOL) one half a tablet  2 times per day  #30 x 11   Entered by:   Layne Benton, RN, BSN   Authorized by:   Sherrill Raring, MD, [facility]   Signed by:   Layne Benton, RN, BSN on 01/17/2010   Method used:   [facility]        [facility]  [address] [id number]       [address]       [place], [place]  [address]       Ph: [phone]       Fax: [phone]   RxID:   [id number] NITROSTAT 0.4 MG SUBL (NITROGLYCERIN) Take 1 tablet under tongue every  5 minutes as needed x 3 doses   #25 x 11   Entered by:   Layne Benton, RN, BSN   Authorized by:   Sherrill Raring, MD, [facility]   Signed by:   Layne Benton, RN, BSN on 01/17/2010   Method used:   [facility]        [facility]  [address] [facility]       [address]       [place], [place]  [address]       Ph: [phone]       Fax: [phone]   RxID:   [id number]

## 2010-09-29 NOTE — Miscellaneous (Signed)
  Clinical Lists Changes  Medications: Removed medication of EXCEDRIN MIGRAINE 250-250-65 MG TABS (ASPIRIN-ACETAMINOPHEN-CAFFEINE) take two by mouth once daily

## 2010-09-29 NOTE — Progress Notes (Signed)
Summary: medication refill  Phone Note Call from Patient Call back at Home Phone [phone]   Caller: Patient Call For: Dr. Arlyce Dice Reason for Call: Talk to Nurse Details for Reason: Mediction refill Summary of Call: Needs a refill of Promethazine, 12.5 mg, one tablet as needed every 6 hours for nausea.  Please call her and let her know status of refill. Initial call taken by: Schuyler Amor,  March 29, 2010 2:33 PM  Follow-up for Phone Call        Called pt , [facility]. [facility] stated they thought she needed an office appointment first. She was seen in Feb. Will refill for pt. Follow-up by: Merri Ray CMA Duncan Dull),  March 29, 2010 2:55 PM    Prescriptions: PROMETHAZINE HCL 12.5 MG TABS (PROMETHAZINE HCL) Take 1 tablet every 6 hours as needed for nausea.  #60 x 2   Entered by:   Merri Ray CMA (AAMA)   Authorized by:   Louis Meckel MD   Signed by:   Merri Ray CMA (AAMA) on 03/29/2010   Method used:   [facility]        [facility]  [address] [phone] ([facility]ail[facility]       [address]       [place], [place]  [address]       Ph: [phone]       Fax: [phone]   RxID:   [id number]

## 2010-09-29 NOTE — Progress Notes (Signed)
Summary: Triage  Phone Note Call from Patient Call back at Home Phone [phone] Call back at [phone]   Caller: Patient Call For: Dr. Arlyce Dice Reason for Call: Talk to Nurse Summary of Call: pt would like her amount of pills increased per rx... pt would like 30 count... pt also wants to know if she needs to have a f/u appt Initial call taken by: Vallarie Mare,  October 18, 2009 10:07 AM  Follow-up for Phone Call        Pt. states Bentyl is helping w/pain, quanity increased on refill. Pt. advised to see Dr.Kaplan as needed. Pt. instructed to call back as needed.  Follow-up by: Laureen Ochs LPN,  October 18, 2009 10:11 AM    Prescriptions: BENTYL 10 MG CAPS (DICYCLOMINE HCL) take one tab every 6 hours as needed  #30 x 2   Entered by:   Laureen Ochs LPN   Authorized by:   Louis Meckel MD   Signed by:   Laureen Ochs LPN on 25/36/6440   Method used:   [facility]        [facility]  [address] [phone][facility] ([facility]ail[facility]       [address]       [place], [place]  [address]       Ph: [phone]       Fax: [phone]   RxID:   [id number]

## 2010-09-29 NOTE — Progress Notes (Signed)
Summary: Refill  Phone Note Call from Patient Call back at Home Phone [phone] Call back at Work Phone [phone] Call back at or cell   Call For: Dr Arlyce Dice Reason for Call: Refill Medication Summary of Call: Wants to know why her prescription was denied. Initial call taken by: Leanor Kail [facility],  May 30, 2010 9:50 AM  Follow-up for Phone Call        Called and informed pt that prescription was not denied sent in with 2 refills, pt to contact pharmacy Follow-up by: Merri Ray CMA Duncan Dull),  May 30, 2010 10:01 AM     Appended Document: Refill Had to contact pharmacy to inform them of the refill

## 2010-09-29 NOTE — Progress Notes (Signed)
  Phone Note Call from Patient   Summary of Call: asked if ok to use two 10 mg dicyclomine at a time and I told her ok call taken 3/19 AM Initial call taken by: Iva Boop MD, Clementeen Graham,  November 14, 2009 12:10 PM

## 2010-09-29 NOTE — Miscellaneous (Signed)
Summary: Orders Update  Clinical Lists Changes  Orders: Added new Test order of Arterial Duplex Lower Extremity (Arterial Duplex Low) - Signed 

## 2010-09-29 NOTE — Assessment & Plan Note (Signed)
Summary: RUQ PAIN, GETTING WORSE             DEBORAH   History of Present Illness Visit Type: Follow-up Visit Primary GI MD: Melvia Heaps MD [facility] Primary Provider: Donia Guiles, MD Requesting Provider: n/a Chief Complaint: Patient states that she has RUQ pain that is getting worse. She is using the Robinul Forte but it only works 4 hours then the pain returns. She complains of nausea but does not think it is related to the pain.  History of Present Illness:   Ms. Puleo has returned for evaluation of right upper quadrant pain.  This has been a persistent problem.  Pain is without radiation and is fairly chronic and of moderate intensity.  She is taking Robinul without improvement.  She denies nausea vomiting.  She is status post cholecystectomy.   GI Review of Systems    Reports abdominal pain and  nausea.     Location of  Abdominal pain: RUQ.    Denies acid reflux, belching, bloating, chest pain, dysphagia with liquids, dysphagia with solids, heartburn, loss of appetite, vomiting, vomiting blood, weight loss, and  weight gain.        Denies anal fissure, black tarry stools, change in bowel habit, constipation, diarrhea, diverticulosis, fecal incontinence, heme positive stool, hemorrhoids, irritable bowel syndrome, jaundice, light color stool, liver problems, rectal bleeding, and  rectal pain.    Current Medications (verified): 1)  Paxil Cr 25 Mg Xr24h-Tab (Paroxetine Hcl) .... Take 2 Tablets By Mouth At Bedtime 2)  Detrol La 4 Mg Xr24h-Cap (Tolterodine Tartrate) .[name].. 1 Tablet By Mouth Once Daily 3)  Tizanidine Hcl 4 Mg Tabs (Tizanidine Hcl) .... As Directed (Take 1-2 Tablet By Mouth Once A Day) 4)  Magnesium 250 Mg Tabs (Magnesium) .[name].. 1 Tablet By Mouth Two Times A Day 5)  B2 100 Mg Tabs (Riboflavin) .... Take 2 Tablets By Mouth Two Times A Day 6)  Vitamin C 500 Mg  Tabs (Ascorbic Acid) .[name].. 1 Tablet By Mouth Once Daily 7)  Cvs Saline Nasal Spray 0.65 % Soln (Saline) .... As Needed 8)   Systane 0.4-0.3 % Soln (Polyethyl Glycol-Propyl Glycol) .... Insert 1 Drop in Each Eye Two Times A Day 9)  Dexilant 60 Mg Cpdr (Dexlansoprazole) .... Take 1 Capsule By Mouth Once A Day 30 Minutes Before Breakfast 10)  Nitrostat 0.4 Mg Subl (Nitroglycerin) .... Take 1 Tablet Under Tongue Every  5 Minutes As Needed X 3 Doses 11)  Aspirin 81 Mg  Tabs (Aspirin) .[name].. 1 By Mouth Once Daily 12)  Promethazine Hcl 12.5 Mg Tabs (Promethazine Hcl) .... Take 1 Tablet Every 6 Hours As Needed For Nausea. 13)  Pindolol 5 Mg Tabs (Pindolol) .... One Half A Tablet  2 Times Per Day 14)  Nortriptyline Hcl 50 Mg Caps (Nortriptyline Hcl) .... Take 1 Capsule By Mouth At Bedtime 15)  Glycopyrrolate 2 Mg Tabs (Glycopyrrolate) .... Take One By Mouth 2-3 Times A Day 16)  Hydroxyzine Hcl 25 Mg Tabs (Hydroxyzine Hcl) .... Take 1 Tablet By Mouth  Two Times A Day 17)  Lisinopril 20 Mg Tabs (Lisinopril) .... Take One By Mouth Once Daily 18)  Tricor 145 Mg Tabs (Fenofibrate) .... Tablet Every Day At Bedtime 19)  Pravastatin Sodium 80 Mg Tabs (Pravastatin Sodium) .[name].. 1 Tablet Every Day At Bedtime 20)  Premarin 0.625 Mg/gm Crea (Estrogens, Conjugated) .... Apply Once A Week 21)  Excedrin Migraine 250-250-65 Mg Tabs (Aspirin-Acetaminophen-Caffeine) .... Take Two By Mouth Once Daily 22)  Miralax  Powd (Polyethylene Glycol 3350) .... Mix One Scoop in Water Two Times A Day  Allergies (verified): 1)  ! Wynelle Link Flower Seeds 2)  ! * Fish 3)  Flagyl (Metronidazole) 4)  Reglan (Metoclopramide Hcl) 5)  Sulfamethoxazole (Sulfamethoxazole)  Past History:  Past Medical History: Current Problems:  DIABETES MELLITUS (ICD-250.00) HYPERURICEMIA (ICD-790.6) HYPERCHOLESTEROLEMIA (ICD-272.0) DYSPNEA ON EXERTION (ICD-786.09) GASTRIC POLYP (ICD-211.1) DYSLIPIDEMIA (ICD-272.4) DIZZINESS (ICD-780.4) CANDIDIASIS, ORAL (ICD-112.0) POLYURIA (ICD-788.42) DIARRHEA (ICD-787.91) DYSPHAGIA UNSPECIFIED (ICD-787.20) GASTROPARESIS  (ICD-536.3) HEPATITIS C, CHRONIC (ICD-070.54) ESOPHAGEAL REFLUX (ICD-530.81) CHEST PAIN UNSPECIFIED (ICD-786.50) Oarian Cancer      Past Surgical History: Reviewed history from 08/25/2008 and no changes required. Cholecystectomy Hysterectomy Tubal Ligation Bladder and Anal tack Breast Biopsy-left removal of fallopian tubes-due to ovarian cancer  Family History: Reviewed history from 06/03/2009 and no changes required. Spine and Brain Cancer: Father Family History of Breast Cancer:Sister Family History of Diabetes: Brother, Aunts Family History of Esophageal Cancer: Brother Family History of Stomach Cancer: Paternal Grandfather Family History of Colitis/Crohn's: Sister (crohns) Family History of Heart Disease: uncle, aunt, mother, sister, son No FH of Colon Cancer: Family History of Kidney Disease:sister  Social History: Reviewed history from 06/03/2009 and no changes required. Occupation: Disabled Patient has never smoked.  Alcohol Use - no past Daily Caffeine Use-2 cups daily Illicit Drug Use - no past  Review of Systems       The patient complains of allergy/sinus, anxiety-new, arthritis/joint pain, back pain, change in vision, confusion, cough, fatigue, shortness of breath, sleeping problems, thirst - excessive, urination - excessive, and urine leakage.  The patient denies anemia, blood in urine, breast changes/lumps, coughing up blood, depression-new, fainting, fever, headaches-new, hearing problems, heart murmur, heart rhythm changes, itching, menstrual pain, muscle pains/cramps, night sweats, nosebleeds, pregnancy symptoms, skin rash, sore throat, swelling of feet/legs, swollen lymph glands, thirst - excessive , urination - excessive , urination changes/pain, vision changes, and voice change.    Vital Signs:  Patient profile:   60 year old female Height:      66 inches Weight:      186.4 pounds BMI:     30.19 Pulse rate:   88 / minute Pulse rhythm:   regular BP  sitting:   122 / 74  (left arm) Cuff size:   regular  Vitals Entered By: Harlow Mares CMA Duncan Dull) (October 07, 2009 9:09 AM)  Physical Exam  Additional Exam:  She is a healthy-appearing female  Abdomen is without masses, tenderness or organomegaly   Impression & Recommendations:  Problem # 1:  ABDOMINAL PAIN RIGHT UPPER QUADRANT (ICD-789.01) Patient has nonspecific pain.  Retained bile duct stone is unlikely.  Recommendations #1 check LFTs #2 trial of bentyl  Other Orders: TLB-Hepatic/Liver Function Pnl (80076-HEPATIC)  Patient Instructions: 1)  CC Dr. Donia Guiles Prescriptions: BENTYL 10 MG CAPS (DICYCLOMINE HCL) take one tab every 6 hours as needed  #15 x 2   Entered and Authorized by:   Louis Meckel MD   Signed by:   Louis Meckel MD on 10/07/2009   Method used:   [facility]        [facility]  [address] [phone]       [address]       [place], [place]  [address]       Ph: [phone]       Fax: [phone]   RxID:   [id number]

## 2010-10-31 ENCOUNTER — Encounter: Payer: Self-pay | Admitting: Internal Medicine

## 2010-10-31 LAB — CONVERTED CEMR LAB
BUN: 17 mg/dL (ref 6–23)
Basophils Relative: 1 % (ref 0–1)
Calcium: 9.2 mg/dL (ref 8.4–10.5)
Cholesterol: 157 mg/dL (ref 0–200)
Eosinophils Absolute: 0.2 10*3/uL (ref 0.0–0.7)
Eosinophils Relative: 3 % (ref 0–5)
Glucose, Bld: 107 mg/dL — ABNORMAL HIGH (ref 70–99)
HCT: 40.2 % (ref 36.0–46.0)
Lymphs Abs: 1.9 10*3/uL (ref 0.7–4.0)
MCHC: 31.1 g/dL (ref 30.0–36.0)
MCV: 92.8 fL (ref 78.0–100.0)
Neutrophils Relative %: 50 % (ref 43–77)
Platelets: 221 10*3/uL (ref 150–400)
Potassium: 4.3 meq/L (ref 3.5–5.3)
Sodium: 143 meq/L (ref 135–145)
Total CHOL/HDL Ratio: 4.6
Triglycerides: 229 mg/dL — ABNORMAL HIGH (ref <150)
WBC: 5 10*3/uL (ref 4.0–10.5)

## 2010-11-01 ENCOUNTER — Encounter: Payer: Self-pay | Admitting: Gastroenterology

## 2010-11-04 ENCOUNTER — Telehealth: Payer: Self-pay | Admitting: Internal Medicine

## 2010-11-08 ENCOUNTER — Encounter: Payer: Self-pay | Admitting: Physician Assistant

## 2010-11-08 ENCOUNTER — Ambulatory Visit (INDEPENDENT_AMBULATORY_CARE_PROVIDER_SITE_OTHER): Payer: Medicare Other | Admitting: Physician Assistant

## 2010-11-08 ENCOUNTER — Other Ambulatory Visit: Payer: Self-pay | Admitting: Internal Medicine

## 2010-11-08 DIAGNOSIS — R5381 Other malaise: Secondary | ICD-10-CM

## 2010-11-08 DIAGNOSIS — R5383 Other fatigue: Secondary | ICD-10-CM | POA: Insufficient documentation

## 2010-11-08 DIAGNOSIS — J309 Allergic rhinitis, unspecified: Secondary | ICD-10-CM

## 2010-11-08 DIAGNOSIS — K219 Gastro-esophageal reflux disease without esophagitis: Secondary | ICD-10-CM

## 2010-11-08 DIAGNOSIS — R0609 Other forms of dyspnea: Secondary | ICD-10-CM

## 2010-11-08 DIAGNOSIS — R002 Palpitations: Secondary | ICD-10-CM

## 2010-11-08 DIAGNOSIS — R079 Chest pain, unspecified: Secondary | ICD-10-CM

## 2010-11-08 DIAGNOSIS — R0989 Other specified symptoms and signs involving the circulatory and respiratory systems: Secondary | ICD-10-CM

## 2010-11-08 HISTORY — DX: Other malaise: R53.81

## 2010-11-08 HISTORY — DX: Other fatigue: R53.83

## 2010-11-08 LAB — TSH: TSH: 0.69 u[IU]/mL (ref 0.35–5.50)

## 2010-11-08 NOTE — Letter (Signed)
Summary: Colonoscopy Date Change Letter  [facility]  [address] [place], [place] [address]   Phone: [phone]  Fax: [phone]      November 01, 2010 MRN: [id number]   [facility] [address] [place], [place]  [address]   Dear [name] [name],   Previously you were recommended to have a repeat colonoscopy around this time. Your chart was recently reviewed by Dr.Jaidence Geisler of [facility]. Follow up colonoscopy is now recommended in FEB 2018. This revised recommendation is based on current, nationally recognized guidelines for colorectal cancer screening and polyp surveillance. These guidelines are endorsed by the American Cancer Society, [employer] on Colorectal Cancer as well as numerous other major medical organizations.  Please understand that our recommendation assumes that you do not have any new symptoms such as bleeding, a change in bowel habits, anemia, or significant abdominal discomfort. If you do have any concerning GI symptoms or want to discuss the guideline recommendations, please call to arrange an office visit at your earliest convenience. Otherwise we will keep you in our reminder system and contact you 1-2 months prior to the date listed above to schedule your next colonoscopy.  Thank you,  Barbette Hair. Arlyce Dice, M.D.  [facility] [phone]

## 2010-11-14 LAB — POCT I-STAT, CHEM 8
BUN: 13 mg/dL (ref 6–23)
Chloride: 111 mEq/L (ref 96–112)
Creatinine, Ser: 0.8 mg/dL (ref 0.4–1.2)
Sodium: 143 mEq/L (ref 135–145)

## 2010-11-15 NOTE — Assessment & Plan Note (Signed)
Summary: chest pain   Primary Provider:  Donia Guiles, MD   History of Present Illness: Primary Cardiologist:  Dr. Dietrich Pates  [name] [name] is a 60 yo female with a history of minimal CAD by cardiac catheterization in 2005 (40% LAD stenosis) and a nonischemic Myoview study August 2010 with an ejection fraction of 70%.  Her past medical history includes diabetes, hypertension, hyperlipidemia, GERD and esophageal stricture status post dilatation.  She is followed at the [facility] for depression.  I saw her recently for surgical clearance for foot surgery.  She was cleared for her surgery.  She states it was uneventful.  Her foot is still somewhat tender.  She presents with complaints of fatigue, palpitations and chest pain.  Over the last couple weeks she's noticed these symptoms.  She has a long history of palpitations.  She has felt better on pindolol.  However, her palpitations seems to have increased a little bit lately.  She also notes a substernal pressure.  This can happen at rest.  It can happen while she is exerting herself.  She does not really relate pain being brought on by exertion.  She denies syncope.  She has some mild ankle edema.  She sleeps on one pillow.  She denies PND.  She describes NYHA class II symptoms.    Current Medications (verified): 1)  Paxil Cr 25 Mg Xr24h-Tab (Paroxetine Hcl) .... Take 2 Tablets By Mouth At Bedtime 2)  Tizanidine Hcl 4 Mg Tabs (Tizanidine Hcl) .... As Directed (Take 1/2  Tablet By Mouth Once A Day) 3)  Magnesium 250 Mg Tabs (Magnesium) .[name].. 1 Tablet By Mouth Two Times A Day 4)  B2 100 Mg Tabs (Riboflavin) .... Take 2 Tablets By Mouth Two Times A Day 5)  Vitamin C 500 Mg  Tabs (Ascorbic Acid) .[name].. 1 Tablet By Mouth Once Daily 6)  [facility]vs Saline Nasal Spray 0.65 % Soln (Saline) .... As Needed 7)  Systane 0.4-0.3 % Soln (Polyethyl Glycol-Propyl Glycol) .... Insert 1 Drop in Each Eye Two Times A Day 8)  Dexilant 60 Mg Cpdr (Dexlansoprazole) .... Take  1 Capsule By Mouth Once A Day 30 Minutes Before Breakfast 9)  Nitrostat 0.4 Mg Subl (Nitroglycerin) .... Take 1 Tablet Under Tongue Every  5 Minutes As Needed X 3 Doses 10)  Aspirin 81 Mg  Tabs (Aspirin) .[name].. 1 By Mouth Once Daily 11)  Promethazine Hcl 12.5 Mg Tabs (Promethazine Hcl) .... Take 1 Tablet Every 6 Hours As Needed For Nausea. 12)  Pindolol 5 Mg Tabs (Pindolol) .... One Half A Tablet  2 Times Per Day 13)  Lisinopril 20 Mg Tabs (Lisinopril) .... Take One Half  By Mouth Once Daily 14)  Tricor 145 Mg Tabs (Fenofibrate) .... Tablet Every Day At Bedtime 15)  Pravastatin Sodium 80 Mg Tabs (Pravastatin Sodium) .[name].. 1 Tab Once Daily \\par  16)  Premarin 0.625 Mg/gm Crea (Estrogens, Conjugated) .... Apply Once A Week 17)  Miralax  Powd (Polyethylene Glycol 3350) .... Mix One Scoop in Water Two Times A Day 18)  Bentyl 10 Mg Caps (Dicyclomine Hcl) .... Take One Tab Every 6 Hours As Needed 19)  Vesicare 5 Mg Tabs (Solifenacin Succinate) .[name].. 1 Tab Once Daily 20)  Tylenol Pm .... Prn 21)  Hydroxyzine Hcl 25 Mg/ml Soln (Hydroxyzine Hcl) .[name].. 1 -3 Tabs Once Daily 22)  Naprosyn 250 Mg Tabs (Naproxen) .... As Needed  Allergies (verified): 1)  ! Wynelle Link Flower Seeds 2)  ! * Fish 3)  Flagyl (  Metronidazole) 4)  Reglan (Metoclopramide Hcl) 5)  Sulfamethoxazole (Sulfamethoxazole)  Past History:  Past Medical History: Last updated: 10/07/2009 Current Problems:  DIABETES MELLITUS (ICD-250.00) HYPERURICEMIA (ICD-790.6) HYPERCHOLESTEROLEMIA (ICD-272.0) DYSPNEA ON EXERTION (ICD-786.09) GASTRIC POLYP (ICD-211.1) DYSLIPIDEMIA (ICD-272.4) DIZZINESS (ICD-780.4) CANDIDIASIS, ORAL (ICD-112.0) POLYURIA (ICD-788.42) DIARRHEA (ICD-787.91) DYSPHAGIA UNSPECIFIED (ICD-787.20) GASTROPARESIS (ICD-536.3) HEPATITIS C, CHRONIC (ICD-070.54) ESOPHAGEAL REFLUX (ICD-530.81) CHEST PAIN UNSPECIFIED (ICD-786.50) Oarian Cancer      Review of Systems       She notes increased sinus drainage recently.  She also  notes some nausea recently as well as increased symptoms of IBS.  She describes waterbrash symptoms.  She denies any true wheezing.  He does have a cough from time to time that is productive of clear sputum.  She denies fevers or chills.  She denies purulent sputum.  She denies hemoptysis.  Otherwise, As per  the HPI.  All other systems reviewed and negative.   Vital Signs:  Patient profile:   60 year old female Height:      66 inches Weight:      181 pounds BMI:     29.32 Pulse rate:   84 / minute Resp:     16 per minute BP sitting:   120 / 66  (right arm)  Vitals Entered By: Marrion Coy, CNA (November 08, 2010 11:32 AM)  Physical Exam  General:  Well developed, well nourished, in no acute distress. Head:  normocephalic and atraumatic Eyes:  PERRLA/EOM intact; conjunctiva and lids normal. Nose:  no deformity Neck:  Neck supple, no JVD. No masses, thyromegaly or abnormal cervical nodes. Chest Wall:  no deformities or tenderness with palpation Lungs:  Clear bilaterally to auscultation and percussion, no wheezing or rales. Heart:  normal S1-S2 Regular rate and rhythm No murmur Abdomen:  Bowel sounds positive; abdomen soft and non-tender without masses, organomegaly Msk:  no deformities Extremities:  No clubbing or cyanosis or edema. Neurologic:  Alert and oriented x 3, cranial nerves 2-12 grossly intact. Skin:  Intact without lesions or rashes. Psych:  Normal affect.   EKG  Procedure date:  11/08/2010  Findings:      normal sinus rhythm  HR 84 Normal axis Nonspecific ST-T wave changes Incomplete right bundle-branch block No significant changes previous tracing  Impression & Recommendations:  Problem # 1:  CHEST PAIN UNSPECIFIED (ICD-786.50) Etiology unclear.  She's had has risk factors for coronary artery disease.  It has been over a year since her last assessment for ischemia.  I will set her up for a treadmill Myoview study.  Other possibilities for her symptoms include  persistent symptomatic  palpitations versus acid reflux versus reactive airway disease.  She is already taking Dexilant.  I have added Pepcid 20 mg q.h.s. to her medications to see if this helps.  I've also asked her to start taking Claritin daily and given her a prescription for Proventil HFA to use as needed.  If her cardiac workup is negative we can certainly have her followup with her primary care provider to see if she needs formal PFTs versus continued management for allergies/reactive airway.  Orders: Nuclear Stress Test (Nuc Stress Test)  Problem # 2:  FATIGUE (ICD-780.79) She had recent blood work performed recently with her PCP.  Her hemoglobin was normal.  Will obtain a TSH today.  Problem # 3:  PALPITATIONS (ICD-785.1) Check TSH.  Increase pindolol to 5 mg in the morning and 2.5 mg in the evening.  Problem # 4:  CAD, NATIVE VESSEL (ICD-414.01) She  had nonobstructive disease by cardiac catheterization in the past.  She had a nonischemic Myoview study in 2010.  As noted above she will undergo stress Myoview to assess for ischemia.  EKG today is normal without ischemic changes.  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  We will need to discuss followup Dopplers when she returns in followup.  Other Orders: EKG w/ Interpretation (93000) TLB-TSH (Thyroid Stimulating Hormone) (16109-UEA)  Patient Instructions: 1)  Your physician recommends that you schedule a follow-up appointment in: 2-3 WEEKS WITH DR. ROSS IF NOT AVAILABLE THEN SCHEDULE W/Auron Tadros, PA-C ON SAME DAY DR. Tenny Craw IN OFFICE AS PER Karmah Potocki, PA-C. 2)  Your physician recommends that you return for lab work in: TODAY TSH 786.50, 785.1 3)  Your physician has recommended you make the following change in your medication: START PEPCID 20 MG 1 TAB EVERY NIGHT FOR 2-3 WEEKS THEN TAKE as needed .Marland KitchenMarland KitchenMarland KitchenCLARITIN 10 MG 1 TAB once daily as needed ....PROVENTIL HFA 1-2  PUFFS EVERY 6 HOURS as needed  4)  Your physician has requested that you have an exercise stress myoview.  For further information please visit [email or web address]  Please follow instruction sheet, as given. Prescriptions: PROVENTIL HFA 108 (90 BASE) MCG/ACT AERS (ALBUTEROL SULFATE) 1-2 PUFFS EVERY 6 HOURS as needed  #1 x 0   Entered by:   Danielle Rankin, CMA   Authorized by:   Tereso Newcomer PA-C   Signed by:   Danielle Rankin, CMA on 11/08/2010   Method used:   Electronically to        [facility]  [address] [phone][facility] (retail)       [address]       [place], [place]  [address]       Ph: [phone]       Fax: [phone]   RxID:   [id number] CLARITIN 10 MG TABS (LORATADINE) 1 TAB once daily as needed  #30 x 1   Entered by:   Danielle Rankin, CMA   Authorized by:   Tereso Newcomer PA-C   Signed by:   Danielle Rankin, CMA on 11/08/2010   Method used:   [facility]        [facility]  [address] [id number] ([facility]       [address]       [place], [place]  [address]       Ph: [phone]       Fax: [phone]   RxID:   [id number] PEPCID 20 MG TABS (FAMOTIDINE) 1 TAB at bedtime .Marland KitchenMarland KitchenTAKE FOR 2-3 WEEKS THEN STOP AND JUST TAKE as needed  #30 x 11   Entered by:   Danielle Rankin, CMA   Authorized by:   Tereso Newcomer PA-C   Signed by:   Danielle Rankin, CMA on 11/08/2010   Method used:   [facility]        [facility]  [address] [phone][facility] (retail)       [address]       [place], [place]  [address]       Ph: [phone]       Fax: [phone]   RxID:   [id number] PINDOLOL 5 MG TABS (PINDOLOL) 1 TAB EVERY MORNING.....1/2 (HALF) TABLET EVERY EVENING  #45 x 11   Entered by:  Danielle Rankin, CMA   Authorized by:   Tereso Newcomer PA-C   Signed by:   Danielle Rankin, CMA on 11/08/2010   Method used:   [id number]       [address]       [place], [place]  [address]        Ph: [phone]       Fax: [phone]   RxID:   [id number]  I have personally reviewed the prescriptions today for accuracy.Tereso Newcomer PA-C  November 08, 2010 1:17 PM

## 2010-11-15 NOTE — Progress Notes (Signed)
Summary: pt feeling weak, sob, pressure in chest  Phone Note Call from Patient   Caller: [phone] patient Reason for Call: Talk to Nurse Summary of Call: pt calling re feeling weak, having sob, pressure pain in chest off and on about 2 weeks Initial call taken by: Glynda Jaeger,  November 04, 2010 12:12 PM  Follow-up for Phone Call        Phone Call Completed SPOKE WITH PT C/O CHEST PAIN OFF AND ON  FOR 2 WEEKS  USUALLY LASTING AN HOUR OR SO PAIN FREE AT THIS TIME THEN C/O TIGHTNESS AFTER ON PHONE FOR LITTLE WHILE THINKS MAY HAVE COME ON CAUSE SHE IS TALKING ABOUT IT  ALSO C/O DIZZY EPSIODE LAST N IGHT HAPPENED AS WELL  A WEEK AGO.ALOS C/O BOTTOM LIP BLUE IN COLOR FOR OVER A MONTH RECENT CBC  IS NORMAL  FROM 10/31/10 . WILL DISCUSS WITH  DR Riley Kill PT Erasmo Score AND WILL AWAIT RETURN PHONE CALL. Follow-up by: Scherrie Bateman, LPN,  November 04, 2010 12:37 PM  Additional Follow-up for Phone Call Additional follow up Details #1::        PER DR STUCKEY  NEEDS APPT WITH DR Danajah Birdsell ON MON OR WITH [facility] . DR Delano Frate IS  NOT IN OFFICE UNTIL THURS. AND THERE  IS  NOTHING AVAILABE UNTIL IN [facility]. APPT  MADE WITH [facility] ON  TUES 11/08/10 AT 11:30  PT HAD CONCERNS RE TIMING HAS APPT SCHEDULED IN [place] FOR 1:00 PM IN TALKING FURTHER IS ONLY LUNCH DATE WITH FAMILY.PER PT WILL MAKE ARRANGEMENTS TO MAKE APPT AND LUNCH DATE. Additional Follow-up by: Scherrie Bateman, LPN,  November 04, 2010 1:14 PM

## 2010-11-17 ENCOUNTER — Encounter: Payer: Self-pay | Admitting: Physician Assistant

## 2010-11-22 ENCOUNTER — Ambulatory Visit (HOSPITAL_COMMUNITY): Payer: Medicare Other | Attending: Internal Medicine | Admitting: Radiology

## 2010-11-22 VITALS — Ht 66.0 in | Wt 179.0 lb

## 2010-11-22 DIAGNOSIS — R079 Chest pain, unspecified: Secondary | ICD-10-CM

## 2010-11-22 DIAGNOSIS — R0989 Other specified symptoms and signs involving the circulatory and respiratory systems: Secondary | ICD-10-CM | POA: Insufficient documentation

## 2010-11-22 DIAGNOSIS — R0789 Other chest pain: Secondary | ICD-10-CM

## 2010-11-22 DIAGNOSIS — R0609 Other forms of dyspnea: Secondary | ICD-10-CM | POA: Insufficient documentation

## 2010-11-22 DIAGNOSIS — R9431 Abnormal electrocardiogram [ECG] [EKG]: Secondary | ICD-10-CM

## 2010-11-22 MED ORDER — TECHNETIUM TC 99M TETROFOSMIN IV KIT
11.0000 | PACK | Freq: Once | INTRAVENOUS | Status: AC | PRN
  Administered 2010-11-22: 11 via INTRAVENOUS

## 2010-11-22 MED ORDER — REGADENOSON 0.4 MG/5ML IV SOLN
0.4000 mg | Freq: Once | INTRAVENOUS | Status: AC
  Administered 2010-11-22: 0.4 mg via INTRAVENOUS

## 2010-11-22 MED ORDER — TECHNETIUM TC 99M TETROFOSMIN IV KIT
33.0000 | PACK | Freq: Once | INTRAVENOUS | Status: AC | PRN
  Administered 2010-11-22: 33 via INTRAVENOUS

## 2010-11-22 NOTE — Progress Notes (Signed)
[facility] SITE 3 NUCLEAR MED [address] [place] [address] [phone]  Cardiology Nuclear Med Study [name] [name] Perrell female 11/10/1950   Nuclear Med Background Indication for Stress Test:  Evaluation for Ischemia History: '05 Heart Catheterization N/O CAD and 08/10 Myocardial Perfusion Study NL EF 70% Cardiac Risk Factors: Carotid Disease, Family History - CAD, Hypertension, Lipids, NIDDM and RBBB  Symptoms:  Chest Pressure.  (last date of chest discomfort in the last week), Fatigue and Palpitations   Nuclear Pre-Procedure Caffeine/Decaff Intake:  none NPO After: 8:00pm   Lungs:  clear IV 0.9% NS with Angio Cath:  20g  IV Site: R Hand  IV Started by:  Cathlyn Parsons, RN  Chest Size (in):  40 Cup Size:   B  Height: 5\' 6"  (1.676 m)  Weight:  179 lb (81.194 kg)  BMI:  Body mass index is 28.89 kg/(m^2). Tech Comments:  Pindolol held x 24hrs;BS fasting 113    Nuclear Med Study 1 or 2 day study: 1 day  Stress Test Type:  Treadmill/Lexiscan  Reading UU:VOZDGU Brackbill MD Order Authorizing Provider:  Dietrich Pates  Resting Radionuclide: Technetium 4m Tetrofosmin  Resting Radionuclide Dose: 11 mCi   Stress Radionuclide:  Technetium 39m Tetrofosmin  Stress Radionuclide Dose: 33 mCi           Stress Protocol Rest HR:77 Stress HR: 93  Rest BP: 131/83 Stress BP: 130/93  Exercise Time: N/A METS: N/A  Predicted HR: N/A % of Maximum: N/A        Dose of Adenosine: N/A Dose of Lexiscan: 0.4 mg  Dose of Atropine: N/A Dose of Dobutamine: N/A  Stress Test Technologist: Milana Na, EMT-P  Nuclear Technologist:  Domenic Polite, CNMT     Rest Procedure:  Myocardial perfusion imaging was performed at rest 45 minutes following the intravenous administration of Technetium 45m Tetrofosmin. Rest ECG: NSR-RBBB  Stress Procedure:  The patient received IV Lexiscan 0.4 mg over 15-seconds with concurrent low level exercise and then Technetium 34m Tetrofosmin  was injected at 30-seconds while the patient continued walking one more minute.  There were no significant changes and sob with Lexiscan.  Quantitative spect images were obtained after a 45-minute delay. Stress ECG: No ischemic changes with stress.  QPS Raw Data Images:  Normal; no motion artifact; normal heart/lung ratio. Stress Images:  Normal homogeneous uptake in all areas of the myocardium. Rest Images:  Normal homogeneous uptake in all areas of the myocardium. Subtraction (SDS):  No evidence of ischemia.  Findings Risk Category:  Normal nuclear study. Clinically Abnormal:  No Ischemia:  No Fixed Defect:  No LV Dysfunction:  No Transient Ischemic Dilatation (Normal <1.22):  1.01 Lung/Heart Ratio (Normal <0.45):  .28  Quantitative Gated Spect Images QGS EDV:  74 ml QGS ESV: 22 ml QGS cine images: Normal wall motion QGS EF:  70 %  Impression Exercise Capacity:  Good exercise capacity. BP Response:  Normal blood pressure response. Clinical Symptoms:  No chest pain. ECG Impression:  No significant ST segment change suggestive of ischemia. Comparison with Prior Nuclear Study: No significant change from previous study  Overall Impression:  Normal stress nuclear study.

## 2010-11-23 ENCOUNTER — Telehealth: Payer: Self-pay | Admitting: *Deleted

## 2010-11-23 NOTE — Telephone Encounter (Signed)
Patient aware of normal stress myoview results. She will keep her appointment with Dr.Ross on Friday.

## 2010-11-25 ENCOUNTER — Ambulatory Visit (INDEPENDENT_AMBULATORY_CARE_PROVIDER_SITE_OTHER): Payer: Medicare Other | Admitting: Physician Assistant

## 2010-11-25 ENCOUNTER — Encounter: Payer: Self-pay | Admitting: Physician Assistant

## 2010-11-25 VITALS — BP 102/62 | HR 80 | Ht 66.0 in | Wt 182.0 lb

## 2010-11-25 DIAGNOSIS — R079 Chest pain, unspecified: Secondary | ICD-10-CM

## 2010-11-25 DIAGNOSIS — I6529 Occlusion and stenosis of unspecified carotid artery: Secondary | ICD-10-CM

## 2010-11-25 DIAGNOSIS — R5381 Other malaise: Secondary | ICD-10-CM

## 2010-11-25 DIAGNOSIS — R5383 Other fatigue: Secondary | ICD-10-CM

## 2010-11-25 DIAGNOSIS — R0609 Other forms of dyspnea: Secondary | ICD-10-CM

## 2010-11-25 DIAGNOSIS — R0989 Other specified symptoms and signs involving the circulatory and respiratory systems: Secondary | ICD-10-CM

## 2010-11-25 DIAGNOSIS — R002 Palpitations: Secondary | ICD-10-CM

## 2010-11-25 DIAGNOSIS — K219 Gastro-esophageal reflux disease without esophagitis: Secondary | ICD-10-CM

## 2010-11-25 NOTE — Assessment & Plan Note (Signed)
Myoview normal.  Reassurance.  Possibly GI related and she already has f/u as noted.

## 2010-11-25 NOTE — Assessment & Plan Note (Signed)
This may be a cause of her symptoms.  She has follow up with GI soon.  I recommend she keep this appt and discuss with Dr. Arlyce Dice.

## 2010-11-25 NOTE — Assessment & Plan Note (Addendum)
Better on higher dose of pindolol.  Continue with this for now.  Follow up with Dr. Tenny Craw in 6 mos, or sooner prn.

## 2010-11-25 NOTE — Assessment & Plan Note (Signed)
She may have some reactive airway or complications of allergic rhinitis.  I want her to follow up with her PCP for further recommendations.

## 2010-11-25 NOTE — Patient Instructions (Signed)
You have been referred to Pulmonary for evaluation for sleep study with either Dr. Shelle Iron or Dr. Craige Cotta.  Your physician has requested that you have a carotid duplex Dx 433.10. This test is an ultrasound of the carotid arteries in your neck. It looks at blood flow through these arteries that supply the brain with blood. Allow one hour for this exam. There are no restrictions or special instructions.  Your physician recommends that you schedule a follow-up appointment in: With Dr. Tenny Craw in 6 months.  Need to follow up with your Primary care Physician.  Your physician recommends that you continue on your current medications as directed. Please refer to the Current Medication list given to you today.

## 2010-11-25 NOTE — Progress Notes (Signed)
History of Present Illness: Primary Cardiologist: Dr. Dietrich Pates   [name] [name] is a 60 yo female with a history of minimal CAD by cardiac catheterization in 2005 (40% LAD stenosis) and a nonischemic Myoview study August 2010 with an ejection fraction of 70%. Her past medical history includes diabetes, hypertension, hyperlipidemia, GERD and esophageal stricture status post dilatation. She is followed at the [facility] for depression.  I saw her a couple weeks ago for chest pain, dyspnea and palpitations.  I increased her pindolol.  She feels like this helped.  I set her up for a myoview.  This demonstrated no ischemia and normal LVF.  She added pepcid to her meds at night.  This has not helped.  She has some mild dysphagia with pills as well as water brash and indigestion.  She has a h/o esophageal dilation.  She has an appt with Dr. Arlyce Dice (GI) in a few weeks.  She has tried the proventil.  She feels like it helps a little bit with her breathing.  She continues to complain of fatigue.  Recent hgb and TSH were normal.  She notes a h/o snoring and she often awakens with what she feels like is apnea.  She denies orthopnea, pnd or significant edema.  She has a slight cough with yellow sputum.  No fevers.  No chills.  No hemoptysis.  She feels like her nasal congestion is worse with her nasal saline.   Past Medical History  Diagnosis Date  . DM2 (diabetes mellitus, type 2)   . Hyperlipidemia   . Benign neoplasm of stomach   . Dizziness and giddiness   . Gastroparesis   . Chronic hepatitis C without mention of hepatic coma   . Esophageal reflux   . Chest pain, unspecified   . Ovarian cancer   . CAD (coronary artery disease)     cath 2005: LAD 40%;  Myoview 3/12: EF 70%, no ischemia  . HTN (hypertension)   . Palpitations   . Depression     Current Outpatient Prescriptions  Medication Sig Dispense Refill  . albuterol (PROVENTIL HFA) 108 (90 BASE) MCG/ACT inhaler Inhale 2 puffs into the lungs  every 6 (six) hours as needed.        . ARIPiprazole (ABILIFY) 2 MG tablet Take 2 mg by mouth daily.        . Ascorbic Acid (VITAMIN C) 500 MG tablet Take 500 mg by mouth daily.        [name] aspirin 81 MG tablet Take 81 mg by mouth daily.        [name] conjugated estrogens (PREMARIN) vaginal cream Place vaginally once a week.        [name] dexlansoprazole (DEXILANT) 60 MG capsule Take 60 mg by mouth daily.        [name] dicyclomine (BENTYL) 10 MG capsule Take 10 mg by mouth every 6 (six) hours as needed.        . diphenhydramine-acetaminophen (TYLENOL PM) 25-500 MG TABS Take 1 tablet by mouth at bedtime as needed.        . famotidine (PEPCID) 20 MG tablet Take 20 mg by mouth at bedtime as needed.        . fenofibrate (TRICOR) 145 MG tablet Take 145 mg by mouth daily.        . hydrOXYzine (ATARAX) 25 MG tablet Take 25 mg by mouth 3 (three) times daily as needed.        [name] lisinopril (PRINIVIL,ZESTRIL) 20 MG tablet Take  10 mg by mouth daily.        [name] loratadine (CLARITIN) 10 MG tablet Take 10 mg by mouth daily.        . Magnesium 250 MG TABS Take 1 tablet by mouth 2 (two) times daily.        . Multiple Vitamin (MULTIVITAMIN) tablet Take 1 tablet by mouth daily.        . naproxen (NAPROSYN) 250 MG tablet 250 mg.        . nitroGLYCERIN (NITROSTAT) 0.4 MG SL tablet Place 0.4 mg under the tongue every 5 (five) minutes as needed.        [name] PARoxetine (PAXIL-CR) 25 MG 24 hr tablet Take 50 mg by mouth at bedtime.       . pindolol (VISKEN) 5 MG tablet Take 5 mg by mouth. Take 1 tab in the AM  and 1/2 tab PM       . Polyethyl Glycol-Propyl Glycol (SYSTANE) 0.4-0.3 % SOLN Apply to eye.        . polyethylene glycol (MIRALAX / GLYCOLAX) packet Take 17 g by mouth as needed.        . pravastatin (PRAVACHOL) 80 MG tablet Take 80 mg by mouth daily.        . promethazine (PHENERGAN) 12.5 MG tablet Take 12.5 mg by mouth every 6 (six) hours as needed.        . Riboflavin 100 MG CAPS Take by mouth.        . sodium chloride (OCEAN)  0.65 % nasal spray 1 spray by Nasal route as needed.        . solifenacin (VESICARE) 5 MG tablet Take 5 mg by mouth daily.        [name] tiZANidine (ZANAFLEX) 4 MG tablet Take 2 mg by mouth.         Allergies  Allergen Reactions  . Metoclopramide Hcl     REACTION: unspecified  . Metronidazole     REACTION: unspecified  . Sulfamethoxazole     REACTION: unspecified    Vital Signs: BP 102/62  Pulse 80  Ht 5\' 6"  (1.676 m)  Wt 182 lb (82.555 kg)  BMI 29.38 kg/m2  PHYSICAL EXAM: Well nourished, well developed, in no acute distress HEENT: normal Neck: no JVD Cardiac:  normal S1, S2; RRR; no murmur Lungs:  clear to auscultation bilaterally, no wheezing, rhonchi or rales Abd: soft, nontender, no hepatomegaly Ext: no edema Skin: warm and dry Neuro:  CNs 2-12 intact, no focal abnormalities noted  ASSESSMENT AND PLAN:

## 2010-11-25 NOTE — Assessment & Plan Note (Signed)
She needs follow up carotid dopplers.  I will arrange.

## 2010-11-25 NOTE — Assessment & Plan Note (Signed)
She describes symptoms c/w sleep apnea.  I will send her for sleep medicine consultation.

## 2010-11-29 ENCOUNTER — Other Ambulatory Visit: Payer: Self-pay | Admitting: Gastroenterology

## 2010-12-02 LAB — CBC
HCT: 35.7 % — ABNORMAL LOW (ref 36.0–46.0)
Hemoglobin: 12.5 g/dL (ref 12.0–15.0)
MCV: 86.8 fL (ref 78.0–100.0)
MCV: 87.1 fL (ref 78.0–100.0)
Platelets: 195 10*3/uL (ref 150–400)
RBC: 4.42 MIL/uL (ref 3.87–5.11)
RDW: 13.2 % (ref 11.5–15.5)
WBC: 7.4 10*3/uL (ref 4.0–10.5)
WBC: 9.5 10*3/uL (ref 4.0–10.5)

## 2010-12-02 LAB — BASIC METABOLIC PANEL
BUN: 14 mg/dL (ref 6–23)
BUN: 8 mg/dL (ref 6–23)
Calcium: 8.8 mg/dL (ref 8.4–10.5)
Chloride: 102 mEq/L (ref 96–112)
Chloride: 105 mEq/L (ref 96–112)
Creatinine, Ser: 0.8 mg/dL (ref 0.4–1.2)
Glucose, Bld: 122 mg/dL — ABNORMAL HIGH (ref 70–99)
Glucose, Bld: 146 mg/dL — ABNORMAL HIGH (ref 70–99)
Potassium: 3.7 mEq/L (ref 3.5–5.1)
Potassium: 3.9 mEq/L (ref 3.5–5.1)
Sodium: 135 mEq/L (ref 135–145)

## 2010-12-02 LAB — CARDIAC PANEL(CRET KIN+CKTOT+MB+TROPI)
CK, MB: 1.2 ng/mL (ref 0.3–4.0)
Total CK: 86 U/L (ref 7–177)
Troponin I: 0.01 ng/mL (ref 0.00–0.06)

## 2010-12-02 LAB — URINALYSIS, ROUTINE W REFLEX MICROSCOPIC
Bilirubin Urine: NEGATIVE
Ketones, ur: NEGATIVE mg/dL
Nitrite: NEGATIVE
Protein, ur: NEGATIVE mg/dL
pH: 5.5 (ref 5.0–8.0)

## 2010-12-02 LAB — HEMOGLOBIN A1C
Hgb A1c MFr Bld: 6 % (ref 4.6–6.1)
Mean Plasma Glucose: 126 mg/dL

## 2010-12-02 LAB — DIFFERENTIAL
Eosinophils Absolute: 0.2 10*3/uL (ref 0.0–0.7)
Lymphs Abs: 2.4 10*3/uL (ref 0.7–4.0)
Monocytes Relative: 7 % (ref 3–12)
Neutrophils Relative %: 57 % (ref 43–77)

## 2010-12-02 LAB — TROPONIN I: Troponin I: 0.02 ng/mL (ref 0.00–0.06)

## 2010-12-02 LAB — POCT CARDIAC MARKERS
CKMB, poc: 1.1 ng/mL (ref 1.0–8.0)
Myoglobin, poc: 118 ng/mL (ref 12–200)
Myoglobin, poc: 91.4 ng/mL (ref 12–200)
Troponin i, poc: <0.05 ng/mL (ref 0.00–0.09)

## 2010-12-02 LAB — MAGNESIUM: Magnesium: 2.3 mg/dL (ref 1.5–2.5)

## 2010-12-02 LAB — PHOSPHORUS: Phosphorus: 3.3 mg/dL (ref 2.3–4.6)

## 2010-12-02 LAB — LIPID PANEL: VLDL: UNDETERMINED mg/dL (ref 0–40)

## 2010-12-05 ENCOUNTER — Other Ambulatory Visit: Payer: Self-pay | Admitting: Gastroenterology

## 2010-12-07 LAB — POCT I-STAT 4, (NA,K, GLUC, HGB,HCT): HCT: 39 % (ref 36.0–46.0)

## 2010-12-07 LAB — GLUCOSE, CAPILLARY: Glucose-Capillary: 136 mg/dL — ABNORMAL HIGH (ref 70–99)

## 2010-12-12 ENCOUNTER — Encounter: Payer: Self-pay | Admitting: Gastroenterology

## 2010-12-12 ENCOUNTER — Ambulatory Visit (INDEPENDENT_AMBULATORY_CARE_PROVIDER_SITE_OTHER): Payer: Medicare Other | Admitting: Gastroenterology

## 2010-12-12 VITALS — BP 120/76 | HR 68 | Ht 66.0 in | Wt 185.6 lb

## 2010-12-12 DIAGNOSIS — R1011 Right upper quadrant pain: Secondary | ICD-10-CM

## 2010-12-12 MED ORDER — HYOSCYAMINE SULFATE ER 0.375 MG PO TBCR: 1.0000 | EXTENDED_RELEASE_TABLET | Freq: Two times a day (BID) | ORAL | Status: DC | PRN

## 2010-12-12 NOTE — Progress Notes (Signed)
History of Present Illness:  Ms. Mahnken has returned for evaluation of abdominal pain. She is complaining of right upper quadrant pain that is without radiation. Pain tends to worsen postprandially and only subsides when she falls asleep. It may last 2-3 hours at a time. Dicyclomine may provide partial relief. She status post cholecystectomy. She has gastroparesis but was intolerant to metoclopramide.  She also complains of left upper quadrant pain that may radiate to the back when she bends over or twists.    Review of Systems: Pertinent positive and negative review of systems were noted in the above HPI section. All other review of systems were otherwise negative.    Current Medications, Allergies, Past Medical History, Past Surgical History, Family History and Social History were reviewed in [employer] electronic medical record  Vital signs were reviewed in today's medical record. Physical Exam: General: Well developed , well nourished, no acute distress Head: Normocephalic and atraumatic Eyes:  sclerae anicteric, EOMI Ears: Normal auditory acuity Mouth: No deformity or lesions Lungs: Clear throughout to auscultation Heart: Regular rate and rhythm; no murmurs, rubs or bruits Abdomen: Soft, non tender and non distended. No masses, hepatosplenomegaly or hernias noted. Normal Bowel sounds Rectal:deferred Musculoskeletal: Symmetrical with no gross deformities  Pulses:  Normal pulses noted Extremities: No clubbing, cyanosis, edema or deformities noted Neurological: Alert oriented x 4, grossly nonfocal Psychological:  Alert and cooperative. Normal mood and affect

## 2010-12-12 NOTE — Assessment & Plan Note (Addendum)
Symptoms are not specific. Ulcer, or nonulcer dyspepsia are possibilities. Symptoms could be related to gastroparesis.  Recommendations #1 DC dicyclomine and try Hyomax 0.375 mg twice a day #2  to consider upper endoscopy or trial of domperidone if symptoms do not improve

## 2010-12-12 NOTE — Patient Instructions (Signed)
You will follow-up as needed.

## 2010-12-13 ENCOUNTER — Telehealth: Payer: Self-pay | Admitting: Gastroenterology

## 2010-12-13 NOTE — Telephone Encounter (Signed)
MED ALREADY SENT.....TRIED TO CONTACT PT.[name] KitchenMarland KitchenMarland [name]

## 2010-12-14 ENCOUNTER — Telehealth: Payer: Self-pay | Admitting: Gastroenterology

## 2010-12-14 MED ORDER — HYOSCYAMINE SULFATE 0.375 MG PO CP12: 0.3750 mg | ORAL_CAPSULE | Freq: Two times a day (BID) | ORAL | Status: DC

## 2010-12-14 NOTE — Telephone Encounter (Signed)
Explained to pt med was sent in....[facility] will contact [facility] and call us back.

## 2010-12-14 NOTE — Telephone Encounter (Signed)
Resent medication, pharmacy still saying they never received medication.Marland KitchenMarland Kitchen

## 2010-12-14 NOTE — Telephone Encounter (Signed)
DR Arlyce Dice PT CAN NOT AFFORD MEDICATION, WHAT DO YOU RECOMMEND

## 2010-12-14 NOTE — Telephone Encounter (Signed)
Make sure that she is getting the generic equivalent of hyomax jaw to be cheaper

## 2010-12-15 ENCOUNTER — Telehealth: Payer: Self-pay | Admitting: Gastroenterology

## 2010-12-15 MED ORDER — GLYCOPYRROLATE 2 MG PO TABS: 2.0000 mg | ORAL_TABLET | Freq: Three times a day (TID) | ORAL | Status: DC

## 2010-12-15 NOTE — Telephone Encounter (Signed)
Addended by: Merri Ray on: 12/15/2010 02:04 PM   Modules accepted: Orders

## 2010-12-15 NOTE — Telephone Encounter (Signed)
Robinul is fine

## 2010-12-15 NOTE — Telephone Encounter (Signed)
SEE OTHER TELEPHONE CALL PHONE NOTE

## 2010-12-15 NOTE — Telephone Encounter (Signed)
No if she improves with robinul

## 2010-12-15 NOTE — Telephone Encounter (Signed)
WILL TRY ROBINUL FORTE

## 2010-12-15 NOTE — Telephone Encounter (Signed)
DR Arlyce Dice, Kizzy WANTED ME TO ASK YOU IF SHE NEEDED TO HAVE A EGD...... SHE MEANT TO ASK YOU WHEN SHE WAS IN THE OFFICE BUT SHE FORGOT

## 2010-12-15 NOTE — Telephone Encounter (Signed)
CALLED AND SPOKE WITH PHARMACIST THEY ARE TRYING LOWER DOSAGE MED. MAY GO THROUGH WITH INSURACE CONTACTED PT TO INFORM TO CHECK WITH HER PHARMACY

## 2010-12-16 ENCOUNTER — Telehealth: Payer: Self-pay | Admitting: Gastroenterology

## 2010-12-16 NOTE — Telephone Encounter (Signed)
The pharmacy filled the Hyomax and the Robinul Forte, pt is confused because they both do similar things. Instructed pt that Dr. Arlyce Dice had suggested that she try the Robinul. Pt states she will just hold onto the Hyomax in case she needs it later.

## 2010-12-23 ENCOUNTER — Telehealth: Payer: Self-pay | Admitting: Gastroenterology

## 2010-12-23 NOTE — Telephone Encounter (Signed)
Pt reports that the Robinul Forte is not working. Pt will try the Hyomax and let us know if she continues to have problems. Pt verbalized understanding.

## 2010-12-28 ENCOUNTER — Other Ambulatory Visit: Payer: Self-pay | Admitting: Obstetrics and Gynecology

## 2011-01-02 ENCOUNTER — Telehealth: Payer: Self-pay | Admitting: Gastroenterology

## 2011-01-02 NOTE — Telephone Encounter (Signed)
Pt states Hyosyamine works for her but it is 24$ for 1 week

## 2011-01-02 NOTE — Telephone Encounter (Signed)
Pt states that when she saw her GYN doc last week she suggested that the pt discuss with Dr. Arlyce Dice about having a CT scan done. Pt has been having cramping and the GYN thought that since the pt has a history of cancer we might want to order a CT. Pt sees Dr. Conley Simmonds. Dr. Arlyce Dice please advise.

## 2011-01-03 NOTE — Telephone Encounter (Signed)
Pt also wants to know about having a CT scan ordered. Dr. Arlyce Dice please advise.

## 2011-01-03 NOTE — Telephone Encounter (Signed)
Try robinul forte bid prn

## 2011-01-03 NOTE — Telephone Encounter (Signed)
Spoke with pt and we called in Robinul forte on 12/15/10. Pt tried taking that tid but states it did not work. I called her pharmacy and they state that Medicare part D does not cover the Hyomax. Is there anything else she can try? Please advise.

## 2011-01-03 NOTE — Telephone Encounter (Signed)
Try robinul forte first before getting CT

## 2011-01-04 MED ORDER — DICYCLOMINE HCL 10 MG PO CAPS: 10.0000 mg | ORAL_CAPSULE | Freq: Four times a day (QID) | ORAL | Status: DC

## 2011-01-04 NOTE — Telephone Encounter (Signed)
Pt aware of Dr. Marzetta Board recommendations. Prescription sent to pharmacy.

## 2011-01-04 NOTE — Telephone Encounter (Signed)
Bentyl 10mg  qid prn

## 2011-01-05 ENCOUNTER — Telehealth: Payer: Self-pay | Admitting: Gastroenterology

## 2011-01-05 MED ORDER — BELLADONNA ALK-PHENOBARBITAL 16.2 MG PO TABS: 2.0000 | ORAL_TABLET | ORAL | Status: DC | PRN

## 2011-01-05 NOTE — Telephone Encounter (Signed)
Pt states that she did not pick up the Bentyl that we sent to the pharmacy yesterday because she was already taking that when she came to see [place]. She states that it had stopped working. Pt states the Robinul forte did not work and the hyomax worked better but she cannot afford it. She states that the pain used to be just on the right side but now it is on the left side as well. Dr. Arlyce Dice please advise.

## 2011-01-05 NOTE — Telephone Encounter (Signed)
Try donnatol 2 tabs q6h prn

## 2011-01-05 NOTE — Telephone Encounter (Signed)
Pt aware of Dr. Kaplan's recommendations. Rx sent to pharmacy. 

## 2011-01-10 NOTE — Assessment & Plan Note (Signed)
[facility]                            CARDIOLOGY OFFICE NOTE   NAME:Helminiak, Derricka                         MRN:          [id number]  DATE:03/05/2007                            DOB:          1950/11/08    IDENTIFICATION:  Ms. Arkin is a 60 year old woman who I saw last in  Cardiology Clinic back in April.  She has a history of minimal CAD,  chest pain, gastroparesis, anxiety, and excess life stress.   Since seen she has also been seen in GI.   She continues to complain of some chest tightness, unchanged however  from previous.  Not associated with any particular activity.  She still  has significant stressors at home with her children and grandchildren.   CURRENT MEDICATIONS:  1. Paxil CR 25.  2. Tramadol 100.  3. Potassium 20 mEq.  4. Zetia 10.  5. Protonix 40.  6. Detrol LA 4.  7. Gemfibrozil 600.  8. Erythromycin 500 b.i.d.  9. Systane eye drops.  10.Norvasc 2.5 b.i.d.  11.Vitamin C.  12.Aspirin 81.  13.Promethazine 25 q.6.  14.Pyridium t.i.d.   PHYSICAL EXAMINATION:  Patient is in no distress.  Blood pressure  117/79, pulse is 99, weight 183.  LUNGS:  Clear.  CARDIAC:  Regular rate and rhythm, S1-S2, no S3, no significant murmurs.  ABDOMEN:  Benign.  EXTREMITIES:  No edema.   Twelve lead EKG, normal sinus rhythm 96 beats per minute, RSR prime in  V1.   IMPRESSION:  1. Chest pain.  I am not convinced the patient's pain or discomfort is      cardiac.  Cath in 2005 showed only a 40% eccentric plaquing of the      left anterior descending.  Again would monitor risk factors.  Would      not plan further evaluation.  She did have a Myoview scan done in      the Spring in May that was normal without ischemia, again, going      against this supporting significant cardiac source.  2. Dyslipidemia.  Last lipid panel in April LDL was 96, HDL was low at      31, with triglycerides of 218.  I have talked to her about cutting      back on  sugars and sweets, and this will need to be followed.      Indeed she may need additional agents to control this.  Should be      followed up later in the year.   I have not set a definite followup, but would be happy to see the  patient as needed.  Again, I think her cholesterol can be managed in  primary care with close dietary followup.  I am available as needed.     Pricilla Riffle, MD, [facility]  Electronically Signed    PVR/MedQ  DD: 03/05/2007  DT: 03/06/2007  Job #: [id number]   cc:   Melvern Banker

## 2011-01-10 NOTE — Op Note (Signed)
NAME:  [name], [name]                ACCOUNT NO.:  [id number]   MEDICAL RECORD NO.:  [id number]          PATIENT TYPE:  AMB   LOCATION:  [facility]                         FACILITY:  [facility]   PHYSICIAN:  Martina Sinner, MD DATE OF BIRTH:  Aug 24, 1951   DATE OF PROCEDURE:  12/01/2008  DATE OF DISCHARGE:                               OPERATIVE REPORT   PREOPERATIVE DIAGNOSIS:  Pelvic pain.   POSTOPERATIVE DIAGNOSIS:  Pelvic pain.   SURGERY:  Cystoscopy, hydrodistention bladder installation therapy.   [name] has vaginal pressure causing frequent voiding.  She has poor  flow.  I thought she may be somewhat over corrected in combination with  a poorly contractile bladder.  She also has a cystocele that could lead  to a hinge effect.  She had a little bit of a narrow vagina and she is  on Premarin cream for vaginal dryness.   The patient was prepped and draped in the usual fashion.  A 21 scope was  utilized.  Bladder mucosa and trigone were normal.  There was no stitch,  foreign body or carcinoma.  She had a moderate cystocele grade 2 on  cystoscopy.  The bladder mucosa and trigone were normal.  There was no  stitch, foreign body or carcinoma.  She was hydrodistended to 850 mL.  The bladder was emptied.  On reinspection, there were glomerulations.  I  then hydrodistended at the same volume for 5 minutes, re-emptied the  bladder, reinspected and again it was negative.  Red rubber catheter was  inserted and I instilled 20 mL of 0.5% Marcaine in combination with 400  mg of Pyridium.   Under anesthesia I examined the patient.  She had a moderate length  anterior vaginal wall though she did have approximately 1-2 cm of  shortening of her vaginal length.  The vaginal cuff was well supported.  She had a high grade II cystocele, but it did bulge somewhat under  anesthesia.  It may be the cause of her vaginal pressure though I am  suspect that this may be due to her vaginitis and I will continue  with  Premarin cream.  Cystoscopically and in combination with simultaneous  examination  I felt she had a minimal ski jump at 6 o'clock of the  bladder neck with a hinge effect from her cystocele and a mildly over  corrected urethrovesical angle visually and palpably.  She had a  moderately long urethra.   If [name] ever went on to have a cystocele repair, she would require  an anterior repair and a supporting four corner graft to try to decrease  recurrence rates.  Urethrolysis may help her void more efficiently, but  it will depend upon treatment goals.  She does not know what type of  bladder suspension she had in 1992.  She did not have evidence of  interstitial cystitis.  Her further management will depend on her  treatment goals and most bothersome symptoms.           ______________________________  Martina Sinner, MD  Electronically Signed  SAM/MEDQ  D:  12/01/2008  T:  12/01/2008  Job:  [id number]

## 2011-01-10 NOTE — Assessment & Plan Note (Signed)
[facility]                         GASTROENTEROLOGY OFFICE NOTE   NAME:Burbridge, SIENA POEHLER                       MRN:          [id number]  DATE:11/20/2007                            DOB:          14-May-1951    PROBLEM:  Dyspepsia.   Ms. Fullard has returned for scheduled followup.  Upper endoscopy  demonstrated a very early distal esophageal stricture for which she was  dilated to 18 mm.  Benign gastric polyp was seen, as well as nodular  duodenal mucosa, which biopsies demonstrated inflammation only.  Ms.  Brandel only has mild dysphagia swallowing very large pills.  She has  been complaining of a sour stomach with loose stools since starting  antibiotics for a sinus infection and an NSAID for her headaches.  She  is complaining of mild spasticity, as well.   EXAM:  Pulse 92, blood pressure 126/82, weight 198.   IMPRESSION:  1. Dyspepsia.  I believe this is related to her medications, including      NSAIDs and possibly her antibiotics.  2. Esophageal stricture, minimally symptomatic, following dilatation      therapy.   RECOMMENDATIONS:  Once completing her antibiotics, Ms. Sou will  reassess her GI complaints.  If she still has upper GI complaints, then  I would recommend that she hold her NSAIDs for a short period of time.     Barbette Hair. Arlyce Dice, MD,FACG  Electronically Signed    RDK/MedQ  DD: 11/20/2007  DT: 11/20/2007  Job #: [id number]   cc:   Lorin Picket A. Gerda Diss, MD

## 2011-01-10 NOTE — Assessment & Plan Note (Signed)
[facility]                            CARDIOLOGY OFFICE NOTE   NAME:[name], [name] EICHHOLZ                       MRN:          [id number]  DATE:09/18/2008                            DOB:          May 15, 1951    IDENTIFICATION:  [name] [name] is a 60 year old woman with history of chest  pain, minimal CAD, esophageal stricture, dysphasia.  I last saw her in  December.   At that time, she was complaining of chest pain, which I was not  convinced was cardiac.  She was on antibiotics erythromycin for GI, and  I recommended she continue on this.  She also noted some dizziness,  question vertigo.   Since seen, she notes still some dizziness.  No chest pains.   CURRENT MEDICINES:  1. Klor-Con 20.  2. Kapidex 60.  3. Paxil 50.  4. Nortriptyline 50 at bedtime.  5. Zetia 10.  6. Detrol LA 4.  7. Dulcolax stool softener.  8. Sustain eye drops b.i.d.  9. Vitamin C.  10.Magnesium.  11.B12.  12.Saline nasal spray.  13.Glycopyrrolate,   PHYSICAL EXAMINATION:  GENERAL:  On exam, the patient is in no distress.  VITAL SIGNS:  Blood pressure lying 129/79 pulse 98; sitting 123/79,  pulse 97; standing at 0 minutes 120/76; pulse 99; standing at 2 minutes,  blood pressure 119/78, pulse 103; at 5 minutes, blood pressure 130/82,  pulse 100.  Note with standing, the patient has felt a little bit weird  throughout.  LUNGS:  Clear.  CARDIAC:  Regular rate and rhythm, S1, S2, no S3, no murmurs.  ABDOMEN:  Benign.  EXTREMITIES:  No edema.   IMPRESSION:  1. Dizziness.  The patient is an orthostatic.  Her heart rate always      has been a little bit high.  I have checked the orthostatics      several times, she is in the 100s, low 100s, or 90s goes to 100.  I      wonder if some of this is adding to her symptoms and I would      recommend adding a little bit of pindolol 2.5 b.i.d. following her      blood pressure.  I will see her back in a few weeks.  We will check      a  BMET, CBC, TSH and a cortisol today.  2. Chest pain.  The patient denies significant symptoms.  3. Dyslipidemia on Zetia.  We will need followup.   ADDENDUM:  A 12-lead EKG normal sinus rhythm 97, incomplete right bundle-  branch block.     Pricilla Riffle, MD, [facility]  Electronically Signed    PVR/MedQ  DD: 09/20/2008  DT: 09/21/2008  Job #: [id number]

## 2011-01-10 NOTE — Assessment & Plan Note (Signed)
[facility]                         GASTROENTEROLOGY OFFICE NOTE   NAME:Panozzo, Nanda                         MRN:          [id number]  DATE:01/16/2007                            DOB:          [birth year]    PROBLEMS:  Nausea.   HISTORY OF PRESENT ILLNESS:  Ms. [name] [name] has returned for reevaluation.  She has known gastroparesis for which she takes erythromycin.  She is  complaining of intermittent nausea and inability or difficulty with  swallowing pills.  She clearly relates that symptoms are stress-related.  Under more normal circumstances, symptoms are minimal to none.   CURRENT MEDICATIONS:  1. Paxil.  2. Tramadol.  3. Zetia.  4. Protonix.  5. Detrol.  6. Gemfibrozil.  7. Erythromycin.  8. Norvasc.  9. Promethazine.   PHYSICAL EXAMINATION:  VITAL SIGNS:  Pulse 88, blood pressure 114/76,  weight 184.  ABDOMEN:  No succussion.  Abdomen is without mass, tenderness or  organomegaly.   IMPRESSION:  Gastroparesis.  Though she is complaining of nausea, I  think this is stress-related rather than due to her gastroparesis.  This  also would account for her difficulty with deglutition.   RECOMMENDATIONS:  1. Continue current medication.  2. Mental Health follow up.     Barbette Hair. Arlyce Dice, MD,FACG  Electronically Signed    RDK/MedQ  DD: 01/16/2007  DT: 01/16/2007  Job #: [id number]   cc:   [facility]

## 2011-01-10 NOTE — Assessment & Plan Note (Signed)
[facility]                            CARDIOLOGY OFFICE NOTE   NAME:[name] [name], [name] [name]                       MRN:          [id number]  DATE:07/31/2008                            DOB:          1951/04/05    IDENTIFICATION:  [name] [name] is a 60 year old woman with a history of  chest pain, minimal CAD by cath, esophageal stricture and dysphasia.  I  last saw her in July.   The patient called several days ago to the office complaining of nausea,  vomiting, dizziness with the room spinning, some chest pain that was  sharp.  She actually went to see Dr. Arlyce Dice.  He has placed her on  erythromycin and recommend she continue and keep her appointment in  Cardiology   On describing her episodes, the patient said she was sitting when she  had an episode of pain that was in her left upper chest radiating to her  arm.  Her arm became numb, lasted 20 minutes.  She took a nitroglycerin,  it eased.  Otherwise, her other spells of pain occur more when she is  lying and she will have to sit up with this.  She notes sharp pains with  activity that lasts seconds.   CURRENT MEDICINES:  Include;  1. Protonix 40.  2. Klor-Con.  3. Vitamin C.  4. Promethazine 25.  5. Paxil 25.  6. Detrol LA.  7. Zetia 10.  8. Vitamin B12.  9. Nortriptyline 50 nasal spray b.i.d.  10.Magnesium.  11.Colace.  12.Cysteine eye drops.  13.Kapidex.  14.Tizanidine 4 mg p.r.n.  15.Erythromycin 250 q.i.d.   PHYSICAL EXAMINATION:  GENERAL:  The patient is in no acute distress.  VITAL SIGNS:  At rest, her blood pressure 113/71, pulse 101.  Orthostatic check blood pressure 138/92, pulse 99, asymptomatic sitting  130/89, and pulse 101.  Note some fatigue, shortness of breath, standing  at 0 minutes 126/82, pulse 102 with fatigue and shortness of breath, at  2 minutes 135/88,  pulse 105 with fatigue and shortness of breath, 5  minutes 138/95 pulse 108 with fatigue and shortness of breath.  LUNGS:  Clear.  NECK:  JVP is normal.  CARDIAC:  Regular rate and rhythm.  S1 and S2.  No S3 and no murmurs.  ABDOMEN:  Benign.  EXTREMITIES:  No edema.   A 12-lead EKG normal sinus tachycardia 101 beats per minute.  Incomplete  right bundle-branch block.   IMPRESSION:  1. Chest pain.  The patient had her last catheterization 2005, which      showed normal vessels.  She had a Myoview this summer because of      chest pain, fatigue, and shortness of breath.  This was normal left      ventricular ejection fraction of 75%.  I am not convinced her pain      is due to coronary ischemia.  I would recommend for now to treat      with erythromycin, as she has been given by Dr. Arlyce Dice if she has a      recurrent spell like she had  at the time of sitting.  I would      reassess to consider further evaluation, but would hold for now,      keep her on the same regimen.  I would recommend, she drink      adequate fluids.  She does not demonstrate orthostasis today, but      again with her spells of dizziness that may help.  2. Dizziness, question vertigo again there is still a point I wonder      about.  We will need to follow.  3. Gastrointestinal, as per Dr. Arlyce Dice.  4. Dyslipidemia on Zetia.  Note, LDL of 102, HDL of 27.  She has mild      elevation of LFTs on this.   I will set to see the patient back in January, sooner if her symptoms  become more.     Pricilla Riffle, MD, [facility]  Electronically Signed    PVR/MedQ  DD: 07/31/2008  DT: 08/01/2008  Job #: [id number]

## 2011-01-10 NOTE — Assessment & Plan Note (Signed)
[facility]                         GASTROENTEROLOGY OFFICE NOTE   NAME:[name] [name], BRITTANEE GHAZARIAN                       MRN:          [id number]  DATE:06/21/2007                            DOB:          06-06-1951    PROBLEM:  Gastroparesis.   Ms. Buresh has returned for scheduled followup.  She continues to  complain of nausea.  She is not improved with erythromycin.  Serologies  for hepatitis B, including a CVR and a quantitative were negative.  She  is hepatitis C antibody positive.  Liver tests drawn on June 05, 2007  were improved.  AST was 38 and ALT was 36.  Other tests were normal.  She has had some discharge from her incision wounds related to her  laparoscopic cholecystectomy.   EXAM:  Pulse 80, blood pressure 102/70, weight 183.  She has got healing incisions with 1 area of minimal discharge and  erythema.   IMPRESSION:  1. Idiopathic gastroparesis.  Medicines could be contributing to this.  2. Irritable bowel syndrome.   RECOMMENDATIONS:  1. Discontinue erythromycin and hold her Protonix.  If reflux is not      worsened off Protonix, she will discontinue this.  2. Hydrogen peroxide to her abdominal wounds.     Barbette Hair. Arlyce Dice, MD,FACG  Electronically Signed    RDK/MedQ  DD: 06/21/2007  DT: 06/22/2007  Job #: [id number]   cc:   Thomas A. Cornett, M.D.

## 2011-01-10 NOTE — Assessment & Plan Note (Signed)
[facility]                         GASTROENTEROLOGY OFFICE NOTE   NAME:[name] [name], [name] [name]                       MRN:          [id number]  DATE:10/02/2007                            DOB:          Jul 13, 1951    PROBLEM:  1. Chest discomfort.  2. Dysphagia.   [name] has returned for scheduled GI followup.  Her MRCP was  entirely normal.  She continues to complain of episodes of severe upper  abdominal pain and chest discomfort.  She is not complaining of  dysphagia to solids.  She continues on Protonix one to two times a day.   EXAM:  Pulse 96, blood pressure 132/68, weight 187.   IMPRESSION:  1. Chest and upper abdominal pain.  This could be due to esophageal      spasm.  Cardiac etiology is unlikely.  2. Dysphagia.  Rule out esophageal stricture.  3. Chronic hepatitis C.  4. Gastroparesis.  5. Hepatic steatosis.   RECOMMENDATIONS:  1. NuLev 0.25 mg p.r.n. pain.  2. Upper endoscopy with dilatation as indicated.     Barbette Hair. Arlyce Dice, MD,FACG  Electronically Signed    RDK/MedQ  DD: 10/02/2007  DT: 10/02/2007  Job #: [id number]   cc:   Pricilla Riffle, MD, [facility]  Scott A. Gerda Diss, MD

## 2011-01-10 NOTE — Op Note (Signed)
NAME:  [name] [name], [name] [name]                ACCOUNT NO.:  [id number]   MEDICAL RECORD NO.:  [id number]          PATIENT TYPE:  AMB   LOCATION:  DAY                          FACILITY:  [facility]   PHYSICIAN:  Thomas A. Cornett, M.D.DATE OF BIRTH:  [birth year]   DATE OF PROCEDURE:  05/20/2007  DATE OF DISCHARGE:                               OPERATIVE REPORT   PREOP DIAGNOSIS:  Symptomatic cholelithiasis.   POSTOP DIAGNOSIS:  Symptomatic cholelithiasis.   PROCEDURE:  Laparoscopic cholecystectomy with intraoperative  cholangiogram.   SURGEON:  Thomas A. Cornett, M.D.   ASSISTANT:  Anselm Pancoast. Zachery Dakins, M.D.   ANESTHESIA:  General endotracheal anesthesia with 20 mL of 0.25%  Sensorcaine.   SPECIMEN:  Gallbladder to pathology with small gallstones.   DRAINS:  None.   INDICATIONS FOR PROCEDURE:  The patient is a 60 year old female with  symptomatic cholelithiasis.  She presents today for elective  laparoscopic cholecystectomy for symptomatic cholelithiasis.  The  procedure was discussed with the patient preoperatively as well as  alternative therapies, complications, and long-term expectations.  She  voiced her understanding of the above and agreed to proceed.   DESCRIPTION OF PROCEDURE:  The patient was brought to the operating  room, and placed supine.  After induction of general anesthesia, the  abdomen was prepped and draped in a sterile fashion.  A 1-cm  supraumbilical incision was made and dissection was carried down to the  fascia.  Fascia was opened with a scalpel blade and the Kocher was used  to grab it.  A small hemostat was used to open the peritoneal lining and  enter the abdominal cavity under direct vision.  Pursestring suture of  #0 Vicryl was placed, and a 12-mm Hassan cannula was placed under direct  vision.  Pneumoperitoneum was then created to 15 mmHg of CO2, and a  laparoscope was placed.   Laparoscopy was performed.  She had no significant intra-abdominal  adhesions  or mass.  She had a previous history of ovarian cancer, but  her pelvis appeared to be without disease, and I saw no evidence of any  significant lower pelvic adhesions either.   Next a subxiphoid port was placed under direct vision after the patient  was placed in reverse Trendelenburg and rolled to her left.  Two 5-mm  ports were placed in the right midabdomen both under direct vision.  The  dome of the gallbladder was grasped.  She had a fatty infiltrated liver  without evidence of cirrhosis.  The liver was slightly enlarged.  The  dome was grasped, pushed towards the patient's right shoulder.  A second  grasper was used to grab the infundibulum and pull it to the patient's  right lower quadrant.  Cautery was used to mobilize the gallbladder out  of the gallbladder bed which helped from the triangle of Calot.  I was  able to dissect circumferentially around the infundibulum of the  gallbladder to identify the cystic duct and placed two clips on the  gallbladder side.  I made a small incision below the two clips and  opened the cystic duct.  There was some debris that I milked out.   Through a separate stab incision a Cook cholangiogram catheter was  introduced, placed in the cystic duct, to control the clip.  Intraoral  cholangiogram was then performed using 1/2-strength Hypaque dye.  The  cystic duct was long and tortuous with a very low insertion in the  common duct.  There was free flow of contrast up the common hepatic duct  into the right-and-left hepatic ducts.  There was free flow of contrast  down the common bile duct into the duodenum with a slight delay in  emptying, but overall I did not see any filling defects.   At this point in time, the cholangiogram catheter was removed; and the  cystic duct stump was triple clipped and divided.  The cystic artery was  controlled between clips as well.  The artery actually branched early,  and the individual arteries were controlled  with clips.  The dome of the  gallbladder tore slightly and had some bile leak from the dome but no  stones.  Cautery was used to dissect the gallbladder from the  gallbladder fossa.  This was then placed in an EndoCatch bag.   There was a small puncture to the liver from snipping the cystic duct.  I used cautery to control this.  The gallbladder was then placed in the  gallbladder extraction bag.  The gallbladder bed was inspected,  irrigated with copious amounts of saline until clear.  I used Surgicel,  since the gallbladder bed was a little bit oozy; at this point with  excellent hemostasis noted.  I suctioned out the remainder of the excess  solution of irrigation.   Gallbladder was then extracted through the umbilical port, and passed  off the field.  The gallbladder bed was reinspected, and found to be  hemostatic.  Excess irrigation was suctioned out.  No evidence of injury  to bowel or solid organ at this point with inspection of the scope.  CO2  was allowed to escape, and I removed my 5-mm ports under direct vision.  Subxiphoid port was then vented and then removed.  Hemostasis was  excellent.  Port sites were closed.  Umbilical port site closed with a  #0 Vicryl, and #4-0 Monocryl was used to close skin incisions.  Dermabond was then applied.  All final counts of sponge, needle, and  instruments were found to be correct at this portion of the case.  The  patient was then awoke and taken to recovery in satisfactory condition.      Thomas A. Cornett, M.D.  Electronically Signed     TAC/MEDQ  D:  05/20/2007  T:  05/20/2007  Job:  [id number]   cc:   Dineen Kid. Reche Dixon, M.D.  Fax: [phone]   Pablo Lawrence. Philipp Deputy, M.D.  Fax: [phone]

## 2011-01-10 NOTE — Assessment & Plan Note (Signed)
[facility]                         GASTROENTEROLOGY OFFICE NOTE   NAME:[name] [name] ISAZA                       MRN:          [id number]  DATE:06/05/2007                            DOB:          1951-03-03    PROBLEM LIST:  1. Abdominal pain.  2. Abnormal liver test.   [name] [name] has returned for reevaluation.  On May 20, 2007 she  underwent a cholecystectomy.  Intraoperative cholangiogram was normal.  She is now complaining of immediate post prandial right upper quadrant  pain.  It is moderately severe and may wax and wane for about half an  hour.  On 1 occasion it radiated to the back.  Preoperatively, she had  abnormal liver tests.  May 03, 2007, AST was 78 and ALT was 73.  [name] [name] says she has had hepatitis C in the past.  She has been  without fever.   MEDICATIONS:  1. Paxil.  2. Tramadol.  3. Potassium.  4. Zetia.  5. Protonix.  6. Detrol.  7. Gemfibrozil.  8. Erythromycin.  9. Norvasc.  10.Promethazine.  11.Aspirin.   PHYSICAL EXAMINATION:  Pulse 100, blood pressure 118/60, weight 184.  HEENT: EOMI.  PERRLA.  Sclerae are anicteric.  Conjunctivae are pink.  NECK:  Supple without thyromegaly, adenopathy or carotid bruits.  CHEST:  Clear to auscultation and percussion without adventitious  sounds.  CARDIAC:  Regular rhythm; normal S1 S2.  There are no murmurs, gallops  or rubs.  ABDOMEN:  Healing, small incisions related to her laparoscopy.  Bowel  sounds are normoactive.  Abdomen is soft, nontender and nondistended.  There are no abdominal masses, tenderness, splenic enlargement or  hepatomegaly.  EXTREMITIES:  Full range of motion.  No cyanosis, clubbing or edema.  RECTAL:  Deferred.   IMPRESSION:  1. Abnormal liver tests.  This could be due to hepatic steatosis or      possibly from necrotic hepatitis C.  2. Right upper quadrant pain.  Retained bile duct stone is less likely      in view of her negative  intraoperative cholangiogram.  3. History of ovarian cancer.  4. Gastroparesis.   RECOMMENDATION:  1. Repeat liver function tests and check serologies for hepatitis B      and C.  2. If pain persists or worsens, I would consider abdominal ultrasound      and possibly magnetic resonance cholangiopancreatography.     [name] [name]. Arlyce Dice, MD,FACG  Electronically Signed    RDK/MedQ  DD: 06/05/2007  DT: 06/05/2007  Job #: [id number]   cc:   Maisie Fus A. Cornett, M.D.  [facility]

## 2011-01-10 NOTE — Assessment & Plan Note (Signed)
[facility]                            CARDIOLOGY OFFICE NOTE   NAME:[name] [name], [name] [name]                       MRN:          [id number]  DATE:11/20/2008                            DOB:          01-Mar-1951    IDENTIFICATION:  The patient is a 60 year old woman.  She has a history  of minimal CAD, chest pain, esophageal stricture, dysphasia, and  dyslipidemia.  I last saw her in January.   At this time, I saw her.  She was orthostatic.  I placed her on some  pindolol 2.5 b.i.d.  She has back down to daily because of feeling  fatigued.   She still has some dizziness, her dizziness is a little better though.  Note, she is under increased stress because of family issues.   She was recently diagnosed with diabetes, does note fatigue.  She also  does not think she is taking Zetia anymore.   CURRENT MEDICINES:  1. Klor-Con 20 mEq.  2. Kapidex 60.  3. Paxil 50.  4. Nortriptyline 50.  5. Detrol LA 4 mg.  6. Docusate 2 daily.  7. Systane eye drops.  8. Vitamin C.  9. Magnesium 250 b.i.d.  10.B12.  11.Saline nasal spray b.i.d.  12.Pindolol 2.5 daily.  13.Aspirin 81.  14.Premarin vaginal cream.   PHYSICAL EXAMINATION:  GENERAL:  The patient is in no distress at rest.  VITAL SIGNS:  Blood pressure is 120/80, pulse 88 and regular, and weight  181.  NECK:  JVP is normal.  No bruits.  LUNGS:  Clear.  CARDIAC:  Regular rate and rhythm, S1 and S2.  No S3.  No murmurs.  ABDOMEN:  Benign.  EXTREMITIES:  No edema.   IMPRESSION:  1. Orthostasis.  She is feeling a little bit better.  I would keep her      on the pindolol.  2. History of chest pain, has had Myoview and catheterization not      showing severe significant coronary artery disease or ischemia.      She does have a gastrointestinal history.  3. Dyslipidemia.  We will follow up in the fall.  If she truly has      been diagnosed with diabetes, this need to be controlled.  We will      defer to  primary.   I will set to see the patient in the winter, sooner if problems develop.     Pricilla Riffle, MD, [facility]  Electronically Signed    PVR/MedQ  DD: 11/22/2008  DT: 11/23/2008  Job #: [id number]

## 2011-01-10 NOTE — Assessment & Plan Note (Signed)
[facility]                            CARDIOLOGY OFFICE NOTE   NAME:[name] [name], [name] [name]                       MRN:          [id number]  DATE:02/27/2008                            DOB:          02-Dec-1950    IDENTIFICATION:  [name] [name] is a 60 year old woman who I follow in  clinic.  She has a history of minimal CAD on cath with a history of  chest pain.  She has had Myoviews in the past.  She also has history of  esophageal stricture and dysphagia.   She comes in today complaining of chest pain, but shortness of breath.  She thinks she is giving out easier.  No wheezing.  No cough.   CURRENT MEDICATIONS:  1. Protonix 40 daily.  2. Klor-Con 20.  3. Vitamin C.  4. Promethazine p.r.n.  5. Paxil CR 25.  6. Detrol LA 4.  7. Zetia 10.  8. Vitamin B12.   PHYSICAL EXAMINATION:  GENERAL:  The patient is in no distress at rest.  VITAL SIGNS:  Blood pressure 130/70, pulse is 100 and regular, and  weight 131.  LUNGS:  Clear.  Moving air well.  No wheezes.  CARDIAC:  Regular rate and rhythm, S1 and S2.  No S3.  ABDOMEN:  Benign.  EXTREMITIES:  No edema.   IMPRESSION:  1. Dyspnea.  I am not sure what this represents.  We will set the      patient up for Myoview though.  2. Dyslipidemia.  Last lipid panel, LDL in low 100s, HDL 27, and      triglycerides 119.  Note that AST and ALT were noted to be elevated      at times.  She does have a history of hepatitis C, again seen in      GI.   I would continue on the Zetia and discuss other agents on return visit  and watch carbs.     Pricilla Riffle, MD, [facility]  Electronically Signed    PVR/MedQ  DD: 02/27/2008  DT: 02/28/2008  Job #: [id number]

## 2011-01-10 NOTE — Assessment & Plan Note (Signed)
[facility]                            CARDIOLOGY OFFICE NOTE   NAME:[name] [name], [name] [name]                       MRN:          [id number]  DATE:08/12/2007                            DOB:          1951-05-14    IDENTIFICATION:  [name] [name] is a 60 year old woman whom I have followed  in clinic.  She has a history of chest pain with minimal CAD,  gastroparesis, anxiety.  She has a history of dyslipidemia and hepatitis  C.  She has been followed most recently by Dr. Melvia Heaps in GI.  She  is status post cholecystectomy back in the fall of this year.  Again, I  last saw her back in the summer.   She made an appointment because, this weekend, she was with her mother,  who is a stressor.  She developed chest pressure that lasted about 3  hours.  Not pleuritic.  Notes increased gas all the time.  No activities  made it worse.  It eased on its own.  She is on Protonix, but still has  acid breakthrough she said.   CURRENT MEDICATIONS:  1. Paxil CR 25.  2. Tramadol 100 p.r.n.  3. KCl 20 mEq daily.  4. Zetia 10.  5. Protonix 40 q. a.m.  6. Detrol LA 4.  7. Gemfibrozil 600 daily.  8. Systane eye drops.  9. Norvasc 2.5 b.i.d.  10.Vitamin C 500.  11.Promethazine 25.  12.Peridium t.i.d.   PHYSICAL EXAM:  The patient is in no distress at present.  Denies chest  pain today, even though she has been active.  Her blood pressure is 135/84, pulse 102, on my check is in the 90s,  weight is 184.  LUNGS:  Clear.  CARDIAC:  Regular rate and rhythm.  S1, S2.  No S3.  No murmurs.  CHEST:  Nontender.  ABDOMEN:  Mild tenderness at the incision site, but the site is clean.  EXTREMITIES:  No edema.   ASSESSMENT:  1. Chest pain.  I am not convinced it is cardiac again.  The patient      has had a Myoview in the spring, which showed no ischemia, minimal      coronary artery disease.  I think it may be more gastrointestinal      and encouraged her to stay on the Protonix.   She could increase      this with the acid breakthrough that she is having, and follow up      with Dr. Arlyce Dice.  2. Hepatitis C.  Note, the patient had LFT checked today.  It is up      today from previous at 79 and 75.  Discussed followup with GI.  3. Dyslipidemia.  With liver function, I will discontinue the Lopid.      I would continue the Zetia for now and, again, will have to have      her lipids reassessed.  Her lipids from today, her HDL is still low      at 27, LDL 102, triglycerides 191.  She will again need to have  followup of her LFTs.   Follow up this summer.  Sooner if problems develop or if the symptoms  worsen despite GI evaluation.     Pricilla Riffle, MD, [facility]  Electronically Signed    PVR/MedQ  DD: 08/12/2007  DT: 08/13/2007  Job #: [id number]

## 2011-01-10 NOTE — Assessment & Plan Note (Signed)
[facility]                         GASTROENTEROLOGY OFFICE NOTE   NAME:[name], [name] STAVOLA                       MRN:          [id number]  DATE:09/11/2007                            DOB:          03/18/1951    PROBLEM:  Chest pain.   [name] has returned for re-evaluation.  She has had several discrete  episodes of moderately severe midepigastric pain and upper epigastric  pain, radiating to the back.  It is accompanied by nausea.  She was  evaluated by Dr. Tenny Craw, who felt this was not cardiac in etiology.  I  agree.  She has chronic hepatitis C, for which she is in the carrier  state.  Last set of liver enzymes from December 15 were pertinent for an  ALT of 75.  She has well-established gastroparesis, and has not  responded well to Reglan or erythromycin.   PHYSICAL EXAMINATION:  Pulse 84, blood pressure 124/72, weight 185.   IMPRESSION:  Intermittent upper abdominal and chest pain.  This could be  due to esophageal spasm, or perhaps related to esophageal reflux.  Pain  is consistent with biliary tract pain, though intraoperative  cholangiogram did not demonstrate stone.  Liver enzyme abnormalities are  probably related to her mild chronic persistent hepatitis.   RECOMMENDATIONS:  1. Continue Protonix 40 mg daily.  2. MRCP.     Barbette Hair. Arlyce Dice, MD,FACG  Electronically Signed    RDK/MedQ  DD: 09/11/2007  DT: 09/11/2007  Job #: [id number]   cc:   Thomas A. Cornett, M.D.  Pricilla Riffle, MD, [facility]  Scott A. Gerda Diss, MD

## 2011-01-10 NOTE — Assessment & Plan Note (Signed)
[facility]                            CARDIOLOGY OFFICE NOTE   NAME:Turley, [name] [name]                         MRN:          [id number]  DATE:12/28/2006                            DOB:          Dec 14, 1950    IDENTIFICATION:  Ms. [name] [name] is a 60 year old woman with a history of  minimal CAD, gastroparesis, and anxiety.  I saw her back again on April  17.  She comes in today for followup.  She actually still is having  problems.  She says up in her neck, she will have a tightness in her  throat, and she is again worried if it is her heart.  Her father had  heart problems like that.   Note, she still remains under increased stress with family issues.   CURRENT MEDICATIONS:  1. Paxil CR 25.  2. Tramadol 50 two daily.  3. Potassium 20 mEq daily.  4. Norvasc 2.5 daily.  5. Zetia 10 daily.  6. Protonix 40 daily.  7. Detrol LA 4 daily.  8. Gemfibrozil 600 daily.  9. Erythromycin 500 b.i.d.  10.Eye drops.  11.Norvasc 2.5 b.i.d. (increased).  12.Vitamin C.   PHYSICAL EXAM:  The patient is in no distress.  Blood pressure 129/86, pulse 97, weight 182, stable.  LUNGS:  Clear.  CARDIAC:  Regular rate and rhythm.  S1, S2.  No S3.  ABDOMEN:  Benign.  EXTREMITIES:  No edema.   IMPRESSION:  1. Chest pain.  Not convinced it is cardiac.  She increased her      Norvasc last visit, but is still having symptoms.  Will go ahead      and set her up for a Myoview scan.  Also encouraged her to set up      an appointment hopefully for her to be followed up in      gastroenterology with her history of gastroparesis.  I will see her      back in a couple of months.  2. Dyslipidemia.  Right now, lipid panel done on April 29, HDL of 31,      LDL 36, triglycerides of 218.  She is eating sugar candies to keep      her mouth moist, and I encouraged her to cut back on these.  We      will discuss further when she returns.  Again, follow up in July.    Pricilla Riffle, MD,  [facility]  Electronically Signed   PVR/MedQ  DD: 12/28/2006  DT: 12/28/2006  Job #: [id number]

## 2011-01-13 NOTE — Cardiovascular Report (Signed)
NAME:  [name] [name], [name] [name]                          ACCOUNT NO.:  [id number]   MEDICAL RECORD NO.:  [id number]                   PATIENT TYPE:  INP   LOCATION:  3707                                 FACILITY:  [facility]   PHYSICIAN:  Arturo Morton. Riley Kill, M.D.             DATE OF BIRTH:  1950-10-05   DATE OF PROCEDURE:  09/08/2003  DATE OF DISCHARGE:                              CARDIAC CATHETERIZATION   PROCEDURES PERFORMED:  1. Left heart catheterization.  2. Selective coronary arteriography.  3. Selective left ventriculography.   CARDIOLOGIST:  Arturo Morton. Riley Kill, M.D.   INDICATIONS:  [name] [name] is a 60 year old female who has a complicated  history.  She has had some recurrent chest pain.  She has had questionable  history of congestive heart failure, ovarian cancer, depression and  hypercholesterolemia.  She has had a prior history of some drug use  involving cocaine 2.5 years ago.  She has had recurrent episodes of chest  pain.  The current study is done to assess coronary anatomy.   Importantly, she has had triglycerides of 919 with an HDL of 30.   DESCRIPTION OF THE PROCEDURE:  The procedure was performed from the right  femoral artery using 6 French catheters.  She tolerated the procedure well  and there were no complications.  She was taken to the holding area where I  held her groin for approximately 20 minutes with good hemostasis achieved.   HEMODYNAMIC DATA:  1. Central aortic pressure 120/74, mean 95.  2. Left ventricle 122/6.  3. No gradient on pullback across the aortic valve.   ANGIOGRAPHIC DATA:  1. Ventriculography:  Ventriculography was performed in the RAO projection.     Overall systolic function was well-preserved.  No segmental abnormalities     or contractures were identified.   1. Left Main Coronary Artery:  The left main coronary artery was large and     free of critical disease.   1. LAD:  The LAD coursed to the apex.  There was a large takeoff of a  diagonal and at this location there was very mild eccentric plaquing of     about 40% involving the main portion of the LAD.  The main portion of the     LAD at this point was somewhat small in caliber.  It did not appear to be     critical.  The distal vessel coursed to the apex.   1. Ramus Intermedius:  There was a small radius intermedius that is free of     critical disease.   1. Circumflex Artery:  The circumflex provides two marginal branches and is     free of significant disease.   1. Right Coronary Artery:  The right coronary artery is a large dominant     vessel.  It is large in caliber and fairly smooth.  No high-grade areas     of focal stenosis are  noted.   CONCLUSIONS:  1. Normal left ventricular function.  2. Mild abnormalities of the mid left anterior descending artery that do not     appear to be flow-limiting.   DISPOSITION:  On examination the patient does have some reproducible pain in  the left parasternal area.  Other etiologies of chest pain should probably  be considered.  While the angiographic findings cannot entirely exclude the  possibility of coronary spasm the LAD does not suggest critical stenosis.   I have reviewed the films in detail with the patient's mother and sister  with the agreement of the patient.  She will follow up with Dr. Tenny Craw in the  office.                                               Arturo Morton. Riley Kill, M.D.    TDS/MEDQ  D:  09/08/2003  T:  09/09/2003  Job:  [id number]   cc:   Tresa Endo L. Philipp Deputy, M.D.  [phone] [address]  [place] [address]  Fax: [phone]   Pricilla Riffle, M.D.   [facility]

## 2011-01-13 NOTE — Assessment & Plan Note (Signed)
[facility]                           GASTROENTEROLOGY OFFICE NOTE   NAME:Cryder, Dalissa                         MRN:          [id number]  DATE:07/17/2006                            DOB:          1951/08/07    PROBLEM:  Gastroparesis.  Ms. Baldo has returned for scheduled followup.  Her symptoms of nausea have been under good control.  She remains on  erythromycin 500 mg twice day.  She does complain of constipation for which  she takes an occasional cathartic.  Over the last 2 days she has had nausea,  but she thinks that this is due to gastroenteritis that her mother has had  as well.  All together, she is doing quite well from a GI standpoint.   EXAMINATION:  Pulse 80.  Blood pressure 130/80.  Weight 177.   IMPRESSION:  Gastroparesis.   RECOMMENDATIONS:  Continue current regimen.     Barbette Hair. Arlyce Dice, MD,FACG  Electronically Signed    RDK/MedQ  DD: 07/17/2006  DT: 07/17/2006  Job #: [id number]

## 2011-01-13 NOTE — Assessment & Plan Note (Signed)
[facility]                            CARDIOLOGY OFFICE NOTE   NAME:[name] [name], [name] [name]                         MRN:          [id number]  DATE:12/13/2006                            DOB:          [birth year]    IDENTIFICATION:  Ms. [name] [name] is a 60 year old woman who I saw back in  October of last year.  She has a history of minimal CAD, chest pain,  gastroparesis.  Also has a history of anxiety.   The patient said she called in for appointment back in February but got  too nervous and canceled it.  She was afraid I would schedule a test.   She comes in today, still has episodes of chest pain though she says  they are improved; again, she gets agitated with these.   Has multiple increased stressors with family (depressed sister, son with  marital problems, grandchildren).  She is trying to help out in all  areas.   Denies difficulty swallowing food.   CURRENT MEDICATIONS:  1. Paxil CR 25 daily.  2. Tramadol 100 daily.  3. Potassium 20 mEq daily.  4. Zetia 10 daily.  5. Protonix 40 daily.  6. Detrol LA 4 daily.  7. Lopid 600 daily.  8. Erythromycin 500 b.i.d.  9. Systane eye drops.  10.Amlodipine 2.5 daily.  11.Multivitamin.   PHYSICAL EXAM:  Patient is in no distress, a little anxious.  Blood  pressure is 146/87, pulse is 98, weight 182.  NECK:  No bruits.  JVP is normal, no thyromegaly.  LUNGS:  Clear.  CARDIAC EXAM:  Regular rate and rhythm.  S1, S2, no S3, no murmurs.  ABDOMEN:  Benign.  EXTREMITIES:  No edema.   IMPRESSION:  1. Chest pain, by catheterization in 2005 minimal coronary artery      disease with a 40% left anterior descending lesion.  I am not      convinced her chest pain is due to ischemia.  Again, she had a      normal Myoview at L5.  She does have gastroparesis and      gastroesophageal reflux.  History of a stricture in the past.  I      would continue her on her current regimen.  She can increase the      amlodipine if  needed.  Encouraged her to cut back on some of the      activities she is doing and preserve herself.  Again, talked to her      primary physician about her Paxil.  2. Dyslipidemia.  Will get a fasting lipid panel.  Be in touch with      her.  3. Anxiety.  See above.   Otherwise, I will set followup for 6 months time, sooner if problems  develop.     Pricilla Riffle, MD, [facility]  Electronically Signed    PVR/MedQ  DD: 12/13/2006  DT: 12/13/2006  Job #: [id number]   cc:   [facility]

## 2011-01-13 NOTE — Assessment & Plan Note (Signed)
[facility]                              CARDIOLOGY OFFICE NOTE   NAME:[name] [name], [name] [name]                         MRN:          [id number]  DATE:05/28/2006                            DOB:          Jun 19, 1951    IDENTIFICATION:  [name] [name] is a 60 year old woman last seen in cardiology  clinic back in February.  Has a history of minimal CAD and chest pain.  When  I saw her last I was not convinced her chest pain was anginal.  In fact, she  was then seen in GI, underwent esophageal dilatation, and treatment for  gastritis.  She says this has improved things some.   She notes minimal chest pain, nothing that worries her.   More concerning for her is that she is feeling more panic attacks.  She is  having a hard time getting seen in [facility].   CURRENT MEDICATIONS:  1. Paxil CR 25 q. day.  2. Fexofenadine 180 q. day.  3. Tramadol 100 q. day.  4. Potassium 20 mEq q. day.  5. Norvasc 2.5 q. day.  6. Zetia 10 q. day.  7. Protonix q. day.  8. Detrol LA 4 q. day.  9. Lopid 600 q. day.  10.Erythromycin 500 b.i.d.  11.Systane eye drops.   PHYSICAL EXAM:  The patient is in no distress.  Blood pressure is 138/80, pulse 102, weight 170.  LUNGS:  Clear.  CARDIAC:  No significant murmurs.  Normal S1, S2, no S3.  ABDOMEN:  Benign.  EXTREMITIES:  No edema.   A 12-lead EKG: sinus tachycardia 102 beats per minute.  RV conduction delay.  Q wave in lead III, minimal in F.   IMPRESSION:  1. Chest pain.  I think again it may be more gastrointestinal.  Again,      minimal coronary artery disease by cath back in 2005 and symptoms are      somewhat atypical.  2. Dyslipidemia.  HDL is low.  I would like to have her stop the Lopid and      begin Niaspan.  Will follow up lipids in 8 weeks' time, 500 then 1 g      along with her Zetia.  3. Panic disorder.  Again, needs primary followup.  4. Gastrointestinal.  Again, followed by Dr. Arlyce Dice in gastrointestinal.   I  will setup to see the patient back in the spring.  We will be in touch  with her with her blood work after for lipids.            ______________________________  Pricilla Riffle, MD, [facility]     PVR/MedQ  DD:  05/28/2006  DT:  05/29/2006  Job #:  [id number]

## 2011-01-17 ENCOUNTER — Telehealth: Payer: Self-pay | Admitting: Gastroenterology

## 2011-01-17 NOTE — Telephone Encounter (Signed)
Pt aware of Dr. Kaplan's recommendations. 

## 2011-01-17 NOTE — Telephone Encounter (Signed)
I cannot think of any alternative to hyomax since she does not like the other medications as much. But her try dicyclomine 20 mg during the day and Donnatal at night

## 2011-01-17 NOTE — Telephone Encounter (Signed)
Pt states that the Donnatol puts her to sleep at night so she does not feel the cramping but she cannot take it during the day because of it making her sleepy. Pt has tried Robinul Forte, and bentyl but states they do not work. The Hyomax worked pretty well but she cannot afford that. Pt wants to know if there is anything else Dr. Arlyce Dice might recommend. Please advise.

## 2011-01-18 ENCOUNTER — Telehealth: Payer: Self-pay | Admitting: Internal Medicine

## 2011-01-18 NOTE — Telephone Encounter (Signed)
Pt having a problem wants to discuss with nurse and someone called her yesterday asking her who her pcp, pt see's a dr Clelia Croft at [facility]

## 2011-01-18 NOTE — Telephone Encounter (Signed)
Spoke with pt who states someone called her from here and asked who her primary car MD is. This is Dr Clelia Croft at [facility].  I have been unable to identify who called the patient requesting this information.

## 2011-01-24 ENCOUNTER — Telehealth: Payer: Self-pay | Admitting: Internal Medicine

## 2011-01-24 DIAGNOSIS — I10 Essential (primary) hypertension: Secondary | ICD-10-CM

## 2011-01-24 MED ORDER — LISINOPRIL 20 MG PO TABS: 20.0000 mg | ORAL_TABLET | Freq: Every day | ORAL | Status: DC

## 2011-01-24 NOTE — Telephone Encounter (Signed)
Pt wants to talk to dr Tenny Craw nurse re her meds. Pt states she does not want to leave a message because its to long and just needs to talk to dr Tenny Craw nurse.

## 2011-01-24 NOTE — Telephone Encounter (Signed)
Called patient back. She would like a refill for Lisinopril sent to [facility] [address].

## 2011-01-29 ENCOUNTER — Other Ambulatory Visit: Payer: Self-pay | Admitting: Gastroenterology

## 2011-02-04 ENCOUNTER — Other Ambulatory Visit: Payer: Self-pay | Admitting: Internal Medicine

## 2011-02-28 ENCOUNTER — Ambulatory Visit (HOSPITAL_BASED_OUTPATIENT_CLINIC_OR_DEPARTMENT_OTHER): Payer: Medicare Other | Attending: Family Medicine

## 2011-02-28 DIAGNOSIS — G4733 Obstructive sleep apnea (adult) (pediatric): Secondary | ICD-10-CM | POA: Insufficient documentation

## 2011-03-01 ENCOUNTER — Other Ambulatory Visit: Payer: Self-pay | Admitting: Gastroenterology

## 2011-03-04 NOTE — Procedures (Signed)
NAME:  [name] [name], [name] [name]                ACCOUNT NO.:  [id number]  MEDICAL RECORD NO.:  [id number]          PATIENT TYPE:  OUT  LOCATION:  SLEEP CENTER                 FACILITY:  [facility]  PHYSICIAN:  Chaitra Mast D. Maple Hudson, MD, FCCP, FACPDATE OF BIRTH:  1951-03-25  DATE OF STUDY:  02/28/2011                           NOCTURNAL POLYSOMNOGRAM  REFERRING PHYSICIAN:  AMELIA WILSON  INDICATION FOR STUDY:  Insomnia with sleep apnea.  EPWORTH SLEEPINESS SCORE:  6/24, BMI 29.7.  Weight 184 pounds, height 66 inches.  Neck 15.5 inches.  MEDICATIONS:  Home medications are charted and reviewed.  SLEEP ARCHITECTURE:  Total sleep time 415.5 minutes with sleep efficiency 96.6%.  Stage I was 2.9%, stage II 87.6%, stage III 0.1%, REM 9.4% of total sleep time.  Sleep latency 4.5 minutes, REM latency 345 minutes, awake after sleep onset 8.5 minutes, arousal index 15.3. Bedtime medications are charted.  RESPIRATORY DATA:  Apnea/hypopnea index (AHI) 10.8 per hour.  A total of 75 events was scored including 4 obstructive apneas and 71 hypopneas. All events were associated with supine sleep position.  REM AHI 36.9 per hour.  RDI 13.3.  This is a diagnostic NPSG protocol as ordered.  CPAP titration was not attempted.  OXYGEN DATA:  Moderate snoring with oxygen desaturation to a nadir of 77% and a mean oxygen saturation on room air of 92.7% through the study.  CARDIAC DATA:  Normal sinus rhythm.  MOVEMENT-PARASOMNIA:  No significant movement disturbance.  No bathroom trips.  IMPRESSIONS-RECOMMENDATIONS: 1. Mild obstructive sleep apnea/hypopnea syndrome, AHI 10.8 per hour     with supine events, moderate snoring and oxygen desaturation to a     nadir of 77% on room air with mean of 92.7% on room air through the     study. 2. The study was ordered as a diagnostic NPSG protocol.  If     appropriate, she can return for a dedicated CPAP titration study,     otherwise evaluate for alternative management as  appropriate.    Judas Mohammad D. Maple Hudson, MD, [facility], FACP Diplomate, Biomedical engineer of Sleep Medicine Electronically Signed   CDY/MEDQ  D:  03/04/2011 12:55:35  T:  03/04/2011 22:44:25  Job:  [id number]

## 2011-03-09 ENCOUNTER — Telehealth: Payer: Self-pay | Admitting: Internal Medicine

## 2011-03-09 NOTE — Telephone Encounter (Signed)
Called patient and she advised me that she has had an irregular heart beat on and off times 3 days. She is also complaining of swelling in her left foot with feeling of numbness times several days. Offered her an appointment with Dr.Ross tomorrow but she could not make it because she has to stay with her mother whom is ill. Scheduled her an appointment with Dr.Ross for 7/16 at 830 am. Advised her to go to the ER if her condition changes or worsens.

## 2011-03-09 NOTE — Telephone Encounter (Signed)
Cell [phone], pt calling re irregular heart beat, left foot swelling and feeling numb

## 2011-03-13 ENCOUNTER — Ambulatory Visit (INDEPENDENT_AMBULATORY_CARE_PROVIDER_SITE_OTHER): Payer: Medicare Other | Admitting: Internal Medicine

## 2011-03-13 ENCOUNTER — Encounter: Payer: Self-pay | Admitting: Internal Medicine

## 2011-03-13 DIAGNOSIS — R609 Edema, unspecified: Secondary | ICD-10-CM

## 2011-03-13 DIAGNOSIS — E782 Mixed hyperlipidemia: Secondary | ICD-10-CM

## 2011-03-13 DIAGNOSIS — E785 Hyperlipidemia, unspecified: Secondary | ICD-10-CM

## 2011-03-13 DIAGNOSIS — R002 Palpitations: Secondary | ICD-10-CM

## 2011-03-13 DIAGNOSIS — I2510 Atherosclerotic heart disease of native coronary artery without angina pectoris: Secondary | ICD-10-CM

## 2011-03-13 MED ORDER — FENOFIBRATE 145 MG PO TABS: 145.0000 mg | ORAL_TABLET | Freq: Every day | ORAL | Status: DC

## 2011-03-13 NOTE — Assessment & Plan Note (Signed)
Patients symptoms sound like isolated skips.  Not sustained, not destabilizing.  They have resolved.  May be related to stressors from mother being in hospital I would follow.  Continue meds.  Stay hydrated, get rest, avoid caffeine

## 2011-03-13 NOTE — Patient Instructions (Signed)
See Dr.Ross in December 2012

## 2011-03-13 NOTE — Assessment & Plan Note (Signed)
Edema of L leg has resolved.  She denies SOB.   May be dependent venous insuff with standing    Follow.  No testing.

## 2011-03-13 NOTE — Assessment & Plan Note (Signed)
Good control in March of LDL  COntinue. meds

## 2011-03-13 NOTE — Progress Notes (Signed)
HPIAlice [name] is a 60 yo female with a history of minimal CAD by cardiac catheterization in 2005 (40% LAD stenosis) and a nonischemic Myoview study August 2010 with an ejection fraction of 70%. Her past medical history includes diabetes, hypertension, hyperlipidemia, GERD and esophageal stricture status post dilatation.She was last seen by Wende Mott in March She called last week complaining of occasional palpitations.  Also numbness in foot.  Set up to be seen.  Feels heart beat up in throat Feels isolated skips    Dizzy last week.  Not dizzy now x 3 days. L foot swollen 'like going to bust"  Walking more with mom in hospital.  Numb IMproved, on Sat.  Numbness improved  SInce her mom has been out of the hospital her symptoms have improved and gone away.  She denies chest pains.  No dizziness.  Breathing is OK.  Allergies  Allergen Reactions  . Metoclopramide Hcl     REACTION: unspecified  . Metronidazole     REACTION: unspecified  . Sulfamethoxazole     REACTION: unspecified    Current Outpatient Prescriptions  Medication Sig Dispense Refill  . albuterol (PROVENTIL HFA) 108 (90 BASE) MCG/ACT inhaler Inhale 2 puffs into the lungs every 6 (six) hours as needed.        . ARIPiprazole (ABILIFY) 2 MG tablet Take 2 mg by mouth daily.        . Ascorbic Acid (VITAMIN C) 500 MG tablet Take 500 mg by mouth daily.        [name] aspirin 81 MG tablet Take 81 mg by mouth daily.        Loreen Freud ALK-PHENOBARBITAL PO Take by mouth as needed.        [name] DEXILANT 60 MG capsule TAKE 1 CAPSULE EVERY DAY BEFORE BREAKFAST  30 capsule  8  . famotidine (PEPCID) 20 MG tablet Take 20 mg by mouth at bedtime as needed.        . fenofibrate (TRICOR) 145 MG tablet Take 145 mg by mouth daily.        . hydrOXYzine (ATARAX) 25 MG tablet Take 25 mg by mouth 3 (three) times daily as needed.        . hyoscyamine (LEVSIN, ANASPAZ) 0.125 MG tablet Take 0.125 mg by mouth every 4 (four) hours as needed.        [name] lisinopril  (PRINIVIL,ZESTRIL) 20 MG tablet Take 1 tablet (20 mg total) by mouth daily.  30 tablet  11  . Magnesium 250 MG TABS Take 1 tablet by mouth 2 (two) times daily.        . Multiple Vitamin (MULTIVITAMIN) tablet Take 1 tablet by mouth daily.        . naproxen (NAPROSYN) 250 MG tablet 250 mg.        . nitroGLYCERIN (NITROSTAT) 0.4 MG SL tablet Place 0.4 mg under the tongue every 5 (five) minutes as needed.        [name] PARoxetine (PAXIL-CR) 25 MG 24 hr tablet Take 50 mg by mouth at bedtime.       . pindolol (VISKEN) 5 MG tablet Take 5 mg by mouth. Take 1 tab in the AM  and 1/2 tab PM       . polyethylene glycol (MIRALAX / GLYCOLAX) packet Take 17 g by mouth as needed.        . pravastatin (PRAVACHOL) 80 MG tablet TAKE 1 TABLET AT BEDTIME  30 tablet  9  . promethazine (PHENERGAN) 12.5 MG tablet  TAKE 1 TABLET BY MOUTH EVERY 6 HOURS AS NEEDED FOR NAUSEA  60 tablet  2  . Riboflavin 100 MG CAPS Take 2 capsules by mouth 2 (two) times daily.       . sodium chloride (OCEAN) 0.65 % nasal spray 1 spray by Nasal route as needed.        . solifenacin (VESICARE) 5 MG tablet Take 5 mg by mouth daily.        [name] tiZANidine (ZANAFLEX) 4 MG tablet Take 2 mg by mouth.         Past Medical History  Diagnosis Date  . DM2 (diabetes mellitus, type 2)   . Hyperlipidemia   . Benign neoplasm of stomach   . Dizziness and giddiness   . Gastroparesis   . Chronic hepatitis C without mention of hepatic coma   . Esophageal reflux   . Chest pain, unspecified   . Ovarian cancer   . CAD (coronary artery disease)     cath 2005: LAD 40%;  Myoview 3/12: EF 70%, no ischemia  . HTN (hypertension)   . Palpitations   . Depression     Past Surgical History  Procedure Date  . Cholecystectomy   . Tubal ligation   . Breast biopsy     left  . Abdominal hysterectomy   . Oophorectomy   . Tonsillectomy     Family History  Problem Relation Age of Onset  . Brain cancer Father     spine cancer  . Heart disease Mother   . Breast  cancer Sister   . Kidney disease Sister   . Diabetes Brother   . Esophageal cancer Brother   . Diabetes Maternal Aunt   . Diabetes Paternal Aunt   . Stomach cancer Paternal Grandfather     History   Social History  . Marital Status: Widowed    Spouse Name: N/A    Number of Children: 3  . Years of Education: N/A   Occupational History  . Disabled    Social History Main Topics  . Smoking status: Never Smoker   . Smokeless tobacco: Never Used  . Alcohol Use: No  . Drug Use: Yes    Special: "Crack" cocaine     last used 2003  . Sexually Active: Not on file   Other Topics Concern  . Not on file   Social History Narrative  . No narrative on file    Review of Systems:  All systems reviewed.  They are negative to the above problem except as previously stated.  Vital Signs: BP 128/70  Pulse 82  Resp 18  Ht 5\' 6"  (1.676 m)  Wt 183 lb 12.8 oz (83.371 kg)  BMI 29.67 kg/m2  Physical Exam  Patient is in NAD.  HEENT:  Normocephalic, atraumatic. EOMI, PERRLA.  Neck: JVP is normal. No thyromegaly. No bruits.  Lungs: clear to auscultation. No rales no wheezes.  Heart: Regular rate and rhythm. Normal S1, S2. No S3.   No significant murmurs. PMI not displaced.  Abdomen:  Supple, nontender. Normal bowel sounds. No masses. No hepatomegaly.  Extremities:   Good distal pulses throughout. No lower extremity edema.  Musculoskeletal :moving all extremities.  Neuro:   alert and oriented x3.  CN II-XII grossly intact.  EKG:  NSR>  81 bpm.  rSR' in V1  Assessment and Plan:

## 2011-03-13 NOTE — Assessment & Plan Note (Addendum)
Mild by cath.  Normal myoview earlier this year.  No chest pain

## 2011-03-31 ENCOUNTER — Other Ambulatory Visit: Payer: Self-pay | Admitting: Gastroenterology

## 2011-03-31 NOTE — Op Note (Signed)
NAME:  [name] [name], [name] [name]                ACCOUNT NO.:  [id number]  MEDICAL RECORD NO.:  [id number]          PATIENT TYPE:  AMB  LOCATION:  DSC                          FACILITY:  [facility]  PHYSICIAN:  Alvan Dame, D.P.M. DATE OF BIRTH:  August 25, 1951  DATE OF PROCEDURE:  09/12/2010 DATE OF DISCHARGE:                              OPERATIVE REPORT   SURGEON:  Alvan Dame, DPM  ASSISTANT:  Helane Gunther, DPM  PREOPERATIVE DIAGNOSIS:  Hammertoe deformity second, third, and fourth toes, right foot.  POSTOPERATIVE DIAGNOSIS:  Hammertoe deformity second, third, and fourth toes, right foot.  There was residual contracture at the second metatarsophalangeal joint of the tendon and dorsomedial capsule and tenotomy capsulotomy required.  PROCEDURES:  Hammertoe repair second, third, and fourth toe, right foot, with sequential reduction and tenotomy capsulotomy on the second MTP area, right foot.  INDICATIONS FOR SURGERY:  The patient had a several-year history of previous surgery for correction of hammertoe second with recurrence and additional deformity and rigid contractures.  X-rays confirmed deformity and contracture with subluxation of the IP joint and MTP joint particularly of the second toe and third and fourth toes of the right foot.  X-rays revealed no other osseous abnormalities.  The patient has pain, tenderness, discomfort, has failed conservative care.  Per the patient's request and my recommendation, surgery was proceeded as scheduled.  ANESTHESIA:  IV sedation, local anesthetic, total of 10 mL 50:50 mixture of 2% Xylocaine plain, 0.5% Marcaine plain in a digital block fashion.  HEMOSTASIS:  Right ankle tourniquet, 250 mmHg.  The following procedure was then carried out: 1. Hammertoe repair of second digit right foot.  Attention was     directed to the second digit right foot where 2 similar incisions     were made in the proximal IP joint.  The medial and lateral  collaterals were freed, and at the head of the proximal phalanx     towards the operative field there was noted to be scar tissue from     previous surgical intervention.  At this time, the proximal     phalangeal head was resected as was the base of the middle phalanx.     A 0.045 K-wire was introduced in a retrograde fashion and impacted     to the base of the proximal phalanx producing rigid fixation across     the IP joint.  At this time, there was noted to be residual     contracture at the MTP, and the incision was lengthened at the MTP     joint and dorsal tenotomy capsulotomy were carried out to prevent     further reduction of digital contracture at the MTP joint.  At this     time, the site was lavaged with copious amounts of sterile     antibiotic solution, cleared of all soft tissue and osseous debris.     Closure was accomplished utilizing 5-0 nylon in a simple     interrupted fashion. 2. Hammertoe repair third toe right foot:  The exact same procedure     was done on the second digit which was  repeated in identical     fashion on the third toe with phalangeal and head resection and     base of middle phalanx resection.  Arthrodesis with pin fixation     was carried out with 0.045 K-wire, closure with 5-0 nylon. 3. The exact same procedure was done on the second and third digit     which was repeated on the fourth digit with identical closure.  After completion of all three procedures, site was infiltrated with 1 mL dexamethasone phosphate, 10 mg.  Betadine, Xeroform, and a dry sterile dressing applied to the right foot.  Ankle tourniquet deflated with return of perfusion to all digits.  The patient was returned from the OR to recovery in satisfactory condition.  Discharged with following postop instructions, prescriptions for pain, antibiotic medication, and plan for follow up in the office was then made.  It should be noted intraoperatively fluoroscopy was utilized to  ascertain position of the pin fixation and placement.  The patient will be followed appropriately in outpatient office.          ______________________________ Alvan Dame, D.P.M.     RS/MEDQ  D:  09/12/2010  T:  09/13/2010  Job:  [id number]  Electronically Signed by Alvan Dame D.P.M. on 03/31/2011 05:31:55 PM

## 2011-04-07 ENCOUNTER — Other Ambulatory Visit (HOSPITAL_COMMUNITY): Payer: Self-pay | Admitting: Obstetrics and Gynecology

## 2011-04-07 DIAGNOSIS — Z1231 Encounter for screening mammogram for malignant neoplasm of breast: Secondary | ICD-10-CM

## 2011-04-21 ENCOUNTER — Telehealth: Payer: Self-pay | Admitting: *Deleted

## 2011-04-21 NOTE — Telephone Encounter (Signed)
Called patient and left message for her concerning elevated CK Level that [facility] sent [place].

## 2011-04-24 NOTE — Telephone Encounter (Signed)
Pt was advised to return call to [facility] this morning. Please return pt call.

## 2011-04-24 NOTE — Telephone Encounter (Signed)
Called patient concerning lab work that [facility] received from [employer]. CK level was elevated at 3.8. Dr.Ross would like her to stop the Pravachol for 3 weeks to see if pains in joints become less. Patient will call back in 3 weeks with response.

## 2011-04-25 ENCOUNTER — Ambulatory Visit (HOSPITAL_COMMUNITY)
Admission: RE | Admit: 2011-04-25 | Discharge: 2011-04-25 | Disposition: A | Discharge status: Home / Self Care | Payer: Medicare Other | Source: Ambulatory Visit | Attending: Obstetrics and Gynecology | Admitting: Obstetrics and Gynecology

## 2011-04-25 DIAGNOSIS — Z1231 Encounter for screening mammogram for malignant neoplasm of breast: Secondary | ICD-10-CM | POA: Insufficient documentation

## 2011-05-16 ENCOUNTER — Ambulatory Visit (HOSPITAL_BASED_OUTPATIENT_CLINIC_OR_DEPARTMENT_OTHER): Payer: Medicare Other | Attending: Family Medicine

## 2011-05-16 DIAGNOSIS — G4710 Hypersomnia, unspecified: Secondary | ICD-10-CM | POA: Insufficient documentation

## 2011-05-20 DIAGNOSIS — R0609 Other forms of dyspnea: Secondary | ICD-10-CM

## 2011-05-20 DIAGNOSIS — R0989 Other specified symptoms and signs involving the circulatory and respiratory systems: Secondary | ICD-10-CM

## 2011-05-20 DIAGNOSIS — G4733 Obstructive sleep apnea (adult) (pediatric): Secondary | ICD-10-CM

## 2011-05-20 NOTE — Procedures (Signed)
NAME:  [name] [name], [name] [name]                ACCOUNT NO.:  [id number]  MEDICAL RECORD NO.:  [id number]          PATIENT TYPE:  OUT  LOCATION:  SLEEP CENTER                 FACILITY[facility]  [facility]  PHYSICIAN:  Heylee Tant D. Maple Hudson, MD, FCCP, FACPDATE OF BIRTH:  1951/08/07  DATE OF STUDY:  05/26/2011                           NOCTURNAL POLYSOMNOGRAM  REFERRING PHYSICIAN:  Georganna Skeans, MD  INDICATION FOR STUDY:  Hypersomnia with sleep apnea.  EPWORTH SLEEPINESS SCORE:  5/24.  BMI 29.7, weight 184 pounds, height 66 inches, neck 15.5 inches.  MEDICATIONS:  Home medications are charted and reviewed.  A baseline diagnostic NPSG on February 28, 2011, had recorded an AHI of 10.8 per hour.  CPAP titration is requested.  SLEEP ARCHITECTURE:  Total sleep time 303.5 minutes with sleep efficiency 79.8%.  Stage I was 11%, stage II 89%, stages III and REM were absent.  Sleep latency 25 minutes, awake after sleep onset 47.5 minutes, arousal index 16.4.  Bedtime medication:  Pravastatin, tizanidine, hydroxyzine, Vesicall, vitamin B12, famotidine, hyoscyamine, Systane, paroxetine.  Sleep architecture was fragmented throughout by frequent nonspecific brief waking.  RESPIRATORY DATA:  CPAP titration protocol.  CPAP was titrated to 9 CWP, AHI 0.7 per hour.  She wore a small ResMed Quattro full-face mask with heated humidifier and a C-Flex setting of 3.  Minimal residual snoring was noted.  OXYGEN DATA:  Minimal residual snoring with CPAP.  Mean oxygen saturation 95% on room air through the study.  CARDIAC DATA:  Normal sinus rhythm.  MOVEMENT-PARASOMNIA:  No significant movement disturbance.  No bathroom trips.  IMPRESSIONS-RECOMMENDATIONS: 1. Successful continuous positive airway pressure titration to 9 cm of     water pressure, apnea/hypopnea index 0.7 per hour.  She wore a     small ResMed Quattro full-face mask with heated humidifier and a C-     Flex in the setting of 3. 2. Baseline diagnostic nocturnal  polysomnogram on February 28, 2011,     recorded an apnea/hypopnea index of 10.8 per hour. 3. Fragmented sleep with absent stages III and rapid eye movement     after a complex bedtime medication regimen as listed.  REM may be     suppressed by antidepressant therapy.     Henritta Mutz D. Maple Hudson, MD, [facility], FACP Diplomate, Biomedical engineer of Sleep Medicine Electronically Signed    CDY/MEDQ  D:  05/20/2011 11:24:42  T:  05/20/2011 11:44:26  Job:  [id number]

## 2011-05-26 ENCOUNTER — Ambulatory Visit: Payer: Medicare Other | Admitting: Internal Medicine

## 2011-05-31 ENCOUNTER — Telehealth: Payer: Self-pay | Admitting: Internal Medicine

## 2011-05-31 NOTE — Telephone Encounter (Signed)
SPOKE WITH PT  INSTRUCTED TO CONT TO HOLD PRAVASTATIN  AND TO F/U WITH  PMD . PT AWARE . WILL FORWARD TO DR ROSS FOR  FURTHER REVIEW./CY

## 2011-05-31 NOTE — Telephone Encounter (Signed)
Agree.  I do not think pains are due to pravachol.  Can continue to hold but I would contact primary MD

## 2011-05-31 NOTE — Telephone Encounter (Signed)
Pt called and stopped taking her Pravastatin for at least 2 weeks and she does not feel any better.  What do you recommend at this time.  Still having aches and pains.  Please give her a call and advise.  Pt aware Annice Pih is off but would like to speak to someone.

## 2011-06-01 NOTE — Telephone Encounter (Signed)
Called pt to give information from Dr. Tenny Craw. Left message to call back

## 2011-06-01 NOTE — Telephone Encounter (Signed)
Pt notified and she will follow up with [facility]. She also states her sister passed away this AM and she is very upset and can't calm down. She will contact [facility] regarding possible need for medication to deal with this.

## 2011-06-05 ENCOUNTER — Other Ambulatory Visit: Payer: Self-pay | Admitting: Gastroenterology

## 2011-06-06 ENCOUNTER — Encounter: Payer: Self-pay | Admitting: Internal Medicine

## 2011-06-06 ENCOUNTER — Other Ambulatory Visit: Payer: Self-pay | Admitting: Gastroenterology

## 2011-06-09 LAB — COMPREHENSIVE METABOLIC PANEL
ALT: 73 — ABNORMAL HIGH
Calcium: 9.1
Creatinine, Ser: 0.81
Glucose, Bld: 180 — ABNORMAL HIGH
Sodium: 141
Total Protein: 7.1

## 2011-06-09 LAB — CBC
Hemoglobin: 13.1
MCHC: 34.7
MCV: 87.5
RDW: 13.1

## 2011-06-09 LAB — DIFFERENTIAL
Eosinophils Absolute: 0.2
Lymphocytes Relative: 37
Lymphs Abs: 1.6
Monocytes Relative: 7
Neutro Abs: 2.2
Neutrophils Relative %: 52

## 2011-06-09 LAB — LIPASE, BLOOD: Lipase: 28

## 2011-06-16 ENCOUNTER — Encounter: Payer: Self-pay | Admitting: Internal Medicine

## 2011-06-27 ENCOUNTER — Telehealth: Payer: Self-pay | Admitting: Internal Medicine

## 2011-06-27 DIAGNOSIS — G8929 Other chronic pain: Secondary | ICD-10-CM | POA: Insufficient documentation

## 2011-06-27 DIAGNOSIS — I10 Essential (primary) hypertension: Secondary | ICD-10-CM | POA: Insufficient documentation

## 2011-06-27 DIAGNOSIS — R32 Unspecified urinary incontinence: Secondary | ICD-10-CM | POA: Insufficient documentation

## 2011-06-27 DIAGNOSIS — M549 Dorsalgia, unspecified: Secondary | ICD-10-CM | POA: Insufficient documentation

## 2011-06-27 DIAGNOSIS — F325 Major depressive disorder, single episode, in full remission: Secondary | ICD-10-CM | POA: Insufficient documentation

## 2011-06-27 HISTORY — DX: Major depressive disorder, single episode, in full remission: F32.5

## 2011-06-27 NOTE — Telephone Encounter (Addendum)
[name] Mailed to Pt 06/27/11/km  [name] received back form Pt Via Mail, Cardiac records were faxed to [facility] @ [phone]   07/04/11/km

## 2011-08-10 NOTE — Progress Notes (Addendum)
HPI Patient is a 55 yer old History of very mild CAD by cath in 2005 (40% LAD); Myoview 2010  No ischemia.  Also a history of HTN, DM, dyslipidemia, GERD and esophageal stricture s/p dilation.  She was last in clinic in July.  Last night was laying down for 10 min.  Got up  Had pressure in chest and felt BP was low.    Felt fuzzy. When she woke up this morning.  Felt a little pain on R side of chest. Has been feeling pretty good Almost choked on pill the other night. Allergies  Allergen Reactions  . Metoclopramide Hcl     REACTION: unspecified  . Metronidazole     REACTION: unspecified  . Sulfamethoxazole     REACTION: unspecified    Current Outpatient Prescriptions  Medication Sig Dispense Refill  . albuterol (PROVENTIL HFA) 108 (90 BASE) MCG/ACT inhaler Inhale 2 puffs into the lungs every 6 (six) hours as needed.        . ARIPiprazole (ABILIFY) 2 MG tablet Take 2 mg by mouth daily.        . Ascorbic Acid (VITAMIN C) 500 MG tablet Take 500 mg by mouth daily.        [name] aspirin 81 MG tablet Take 81 mg by mouth daily.        Loreen Freud ALK-PHENOBARBITAL PO Take by mouth as needed.        [name] DEXILANT 60 MG capsule TAKE 1 CAPSULE EVERY DAY BEFORE BREAKFAST  30 capsule  8  . famotidine (PEPCID) 20 MG tablet Take 20 mg by mouth at bedtime as needed.        . fenofibrate (TRICOR) 145 MG tablet Take 1 tablet (145 mg total) by mouth daily.  30 tablet  11  . hydrOXYzine (ATARAX) 25 MG tablet Take 25 mg by mouth 3 (three) times daily as needed.        . hyoscyamine (LEVSIN, ANASPAZ) 0.125 MG tablet TAKE 1 TABLET BY MOUTH EVERY 4 HOURS AS NEEDED  90 tablet  0  . lisinopril (PRINIVIL,ZESTRIL) 20 MG tablet Take 1 tablet (20 mg total) by mouth daily.  30 tablet  11  . Magnesium 250 MG TABS Take 1 tablet by mouth 2 (two) times daily.        . Multiple Vitamin (MULTIVITAMIN) tablet Take 1 tablet by mouth daily.        . naproxen (NAPROSYN) 250 MG tablet 250 mg.        . nitroGLYCERIN (NITROSTAT) 0.4  MG SL tablet Place 0.4 mg under the tongue every 5 (five) minutes as needed.        [name] PARoxetine (PAXIL-CR) 25 MG 24 hr tablet Take 50 mg by mouth at bedtime.       . pindolol (VISKEN) 5 MG tablet Take 5 mg by mouth. Take 1 tab in the AM  and 1/2 tab PM       . polyethylene glycol (MIRALAX / GLYCOLAX) packet Take 17 g by mouth as needed.        . pravastatin (PRAVACHOL) 80 MG tablet TAKE 1 TABLET AT BEDTIME  30 tablet  9  . promethazine (PHENERGAN) 12.5 MG tablet TAKE 1 TABLET BY MOUTH EVERY 6 HOURS AS NEEDED FOR NAUSEA  60 tablet  2  . Riboflavin 100 MG CAPS Take 2 capsules by mouth 2 (two) times daily.       . sodium chloride (OCEAN) 0.65 % nasal spray 1 spray by Nasal route  as needed.        . solifenacin (VESICARE) 5 MG tablet Take 5 mg by mouth daily.        [name] tiZANidine (ZANAFLEX) 4 MG tablet Take 2 mg by mouth.         Past Medical History  Diagnosis Date  . DM2 (diabetes mellitus, type 2)   . Hyperlipidemia   . Benign neoplasm of stomach   . Dizziness and giddiness   . Gastroparesis   . Chronic hepatitis C without mention of hepatic coma   . Esophageal reflux   . Chest pain, unspecified   . Ovarian cancer   . CAD (coronary artery disease)     cath 2005: LAD 40%;  Myoview 3/12: EF 70%, no ischemia  . HTN (hypertension)   . Palpitations   . Depression     Past Surgical History  Procedure Date  . Cholecystectomy   . Tubal ligation   . Breast biopsy     left  . Abdominal hysterectomy   . Oophorectomy   . Tonsillectomy     Family History  Problem Relation Age of Onset  . Brain cancer Father     spine cancer  . Heart disease Mother   . Breast cancer Sister   . Kidney disease Sister   . Diabetes Brother   . Esophageal cancer Brother   . Diabetes Maternal Aunt   . Diabetes Paternal Aunt   . Stomach cancer Paternal Grandfather     History   Social History  . Marital Status: Widowed    Spouse Name: N/A    Number of Children: 3  . Years of Education: N/A    Occupational History  . Disabled    Social History Main Topics  . Smoking status: Never Smoker   . Smokeless tobacco: Never Used  . Alcohol Use: No  . Drug Use: Yes    Special: "Crack" cocaine     last used 2003  . Sexually Active: Not on file   Other Topics Concern  . Not on file   Social History Narrative  . No narrative on file    Review of Systems:  All systems reviewed.  They are negative to the above problem except as previously stated.  Vital Signs: There were no vitals taken for this visit.  Physical Exam Patient is in NAd  HEENT:  Normocephalic, atraumatic. EOMI, PERRLA.  Neck: JVP is normal.   Lungs: clear to auscultation. No rales no wheezes.  Heart: Regular rate and rhythm. Normal S1, S2. No S3.   No significant murmurs. PMI not displaced.  Abdomen:  Supple, nontender. Normal bowel sounds. No masses. No hepatomegaly.  Extremities:   Good distal pulses throughout. No lower extremity edema.  Musculoskeletal :moving all extremities.  Neuro:   alert and oriented x3.  CN II-XII grossly intact.  EKG:  Sinus rhythm.  85 bpm.  LVH. Assessment and Plan:

## 2011-08-11 ENCOUNTER — Encounter: Payer: Self-pay | Admitting: Internal Medicine

## 2011-08-11 ENCOUNTER — Ambulatory Visit (INDEPENDENT_AMBULATORY_CARE_PROVIDER_SITE_OTHER): Payer: Medicare Other | Admitting: Internal Medicine

## 2011-08-11 DIAGNOSIS — I2510 Atherosclerotic heart disease of native coronary artery without angina pectoris: Secondary | ICD-10-CM

## 2011-08-11 DIAGNOSIS — R079 Chest pain, unspecified: Secondary | ICD-10-CM

## 2011-08-11 DIAGNOSIS — E782 Mixed hyperlipidemia: Secondary | ICD-10-CM

## 2011-08-11 MED ORDER — PRAVASTATIN SODIUM 80 MG PO TABS: 80.0000 mg | ORAL_TABLET | Freq: Every day | ORAL | Status: DC

## 2011-08-11 NOTE — Patient Instructions (Signed)
Restart Pravachol  Your physician wants you to follow-up in: 6 to 8 months You will receive a reminder letter in the mail two months in advance. If you don't receive a letter, please call our office to schedule the follow-up appointment.

## 2011-08-16 NOTE — Assessment & Plan Note (Signed)
Keep on same regimen.  Watch carbs.

## 2011-08-16 NOTE — Assessment & Plan Note (Signed)
I am not convinced episode reflects angina.  Otherwise asymptomatic. I would continue current regimen.  Encouraged her to stay active.

## 2011-09-08 DIAGNOSIS — G4733 Obstructive sleep apnea (adult) (pediatric): Secondary | ICD-10-CM

## 2011-09-08 DIAGNOSIS — K760 Fatty (change of) liver, not elsewhere classified: Secondary | ICD-10-CM | POA: Insufficient documentation

## 2011-09-08 HISTORY — DX: Obstructive sleep apnea (adult) (pediatric): G47.33

## 2011-09-25 ENCOUNTER — Other Ambulatory Visit: Payer: Self-pay | Admitting: Gastroenterology

## 2011-10-17 ENCOUNTER — Other Ambulatory Visit: Payer: Self-pay | Admitting: Gastroenterology

## 2011-10-28 ENCOUNTER — Other Ambulatory Visit: Payer: Self-pay | Admitting: Gastroenterology

## 2011-11-08 ENCOUNTER — Other Ambulatory Visit: Payer: Self-pay | Admitting: Gastroenterology

## 2011-11-21 ENCOUNTER — Other Ambulatory Visit: Payer: Self-pay | Admitting: Gastroenterology

## 2011-12-01 ENCOUNTER — Other Ambulatory Visit: Payer: Self-pay | Admitting: Gastroenterology

## 2011-12-01 NOTE — Telephone Encounter (Signed)
Pt. Will need to follow up for future refills.

## 2011-12-21 ENCOUNTER — Other Ambulatory Visit: Payer: Self-pay | Admitting: Gastroenterology

## 2011-12-30 ENCOUNTER — Other Ambulatory Visit: Payer: Self-pay | Admitting: Gastroenterology

## 2011-12-30 ENCOUNTER — Other Ambulatory Visit: Payer: Self-pay | Admitting: Internal Medicine

## 2012-01-05 ENCOUNTER — Other Ambulatory Visit: Payer: Self-pay | Admitting: Gastroenterology

## 2012-01-16 ENCOUNTER — Other Ambulatory Visit: Payer: Self-pay | Admitting: Gastroenterology

## 2012-01-30 ENCOUNTER — Other Ambulatory Visit: Payer: Self-pay | Admitting: Gastroenterology

## 2012-02-11 ENCOUNTER — Other Ambulatory Visit: Payer: Self-pay | Admitting: Gastroenterology

## 2012-02-21 ENCOUNTER — Telehealth: Payer: Self-pay | Admitting: Gastroenterology

## 2012-02-21 MED ORDER — PROMETHAZINE HCL 25 MG PO TABS: 25.0000 mg | ORAL_TABLET | Freq: Two times a day (BID) | ORAL | Status: DC

## 2012-02-21 NOTE — Telephone Encounter (Signed)
Sent pts new script to pharmacy  Pt informed

## 2012-02-27 ENCOUNTER — Other Ambulatory Visit: Payer: Self-pay | Admitting: Gastroenterology

## 2012-03-05 ENCOUNTER — Other Ambulatory Visit: Payer: Self-pay | Admitting: Internal Medicine

## 2012-03-05 MED ORDER — NITROGLYCERIN 0.4 MG SL SUBL: 0.4000 mg | SUBLINGUAL_TABLET | SUBLINGUAL | Status: DC | PRN

## 2012-03-12 ENCOUNTER — Other Ambulatory Visit: Payer: Self-pay | Admitting: Internal Medicine

## 2012-03-13 ENCOUNTER — Emergency Department (HOSPITAL_COMMUNITY)
Admission: EM | Admit: 2012-03-13 | Discharge: 2012-03-13 | Disposition: A | Discharge status: Home / Self Care | Payer: Medicare Other | Attending: Emergency Medicine | Admitting: Emergency Medicine

## 2012-03-13 ENCOUNTER — Emergency Department (HOSPITAL_COMMUNITY): Payer: Medicare Other

## 2012-03-13 ENCOUNTER — Encounter (HOSPITAL_COMMUNITY): Payer: Self-pay | Admitting: Emergency Medicine

## 2012-03-13 DIAGNOSIS — M79609 Pain in unspecified limb: Secondary | ICD-10-CM

## 2012-03-13 DIAGNOSIS — R609 Edema, unspecified: Secondary | ICD-10-CM | POA: Insufficient documentation

## 2012-03-13 DIAGNOSIS — R21 Rash and other nonspecific skin eruption: Secondary | ICD-10-CM | POA: Insufficient documentation

## 2012-03-13 DIAGNOSIS — M79605 Pain in left leg: Secondary | ICD-10-CM

## 2012-03-13 DIAGNOSIS — M7989 Other specified soft tissue disorders: Secondary | ICD-10-CM | POA: Insufficient documentation

## 2012-03-13 DIAGNOSIS — I2510 Atherosclerotic heart disease of native coronary artery without angina pectoris: Secondary | ICD-10-CM | POA: Insufficient documentation

## 2012-03-13 DIAGNOSIS — Z79899 Other long term (current) drug therapy: Secondary | ICD-10-CM | POA: Insufficient documentation

## 2012-03-13 DIAGNOSIS — I10 Essential (primary) hypertension: Secondary | ICD-10-CM | POA: Insufficient documentation

## 2012-03-13 DIAGNOSIS — E119 Type 2 diabetes mellitus without complications: Secondary | ICD-10-CM | POA: Insufficient documentation

## 2012-03-13 DIAGNOSIS — Z8619 Personal history of other infectious and parasitic diseases: Secondary | ICD-10-CM | POA: Insufficient documentation

## 2012-03-13 LAB — CBC WITH DIFFERENTIAL/PLATELET
Eosinophils Relative: 4 % (ref 0–5)
HCT: 36.7 % (ref 36.0–46.0)
Hemoglobin: 11.9 g/dL — ABNORMAL LOW (ref 12.0–15.0)
Lymphocytes Relative: 30 % (ref 12–46)
Lymphs Abs: 1.6 10*3/uL (ref 0.7–4.0)
MCV: 91.1 fL (ref 78.0–100.0)
Monocytes Absolute: 0.3 10*3/uL (ref 0.1–1.0)
Monocytes Relative: 6 % (ref 3–12)
Neutro Abs: 3.2 10*3/uL (ref 1.7–7.7)
RBC: 4.03 MIL/uL (ref 3.87–5.11)
WBC: 5.4 10*3/uL (ref 4.0–10.5)

## 2012-03-13 LAB — BASIC METABOLIC PANEL
BUN: 18 mg/dL (ref 6–23)
Chloride: 109 mEq/L (ref 96–112)
GFR calc Af Amer: 86 mL/min — ABNORMAL LOW (ref >90)
GFR calc non Af Amer: 74 mL/min — ABNORMAL LOW (ref >90)
Glucose, Bld: 109 mg/dL — ABNORMAL HIGH (ref 70–99)
Potassium: 3.7 mEq/L (ref 3.5–5.1)
Sodium: 145 mEq/L (ref 135–145)

## 2012-03-13 MED ORDER — OXYCODONE-ACETAMINOPHEN 5-325 MG PO TABS: 1.0000 | ORAL_TABLET | Freq: Four times a day (QID) | ORAL | Status: AC | PRN

## 2012-03-13 MED ORDER — ONDANSETRON 4 MG PO TBDP
4.0000 mg | ORAL_TABLET | Freq: Once | ORAL | Status: AC
  Administered 2012-03-13: 4 mg via ORAL
  Filled 2012-03-13: qty 1

## 2012-03-13 MED ORDER — OXYCODONE-ACETAMINOPHEN 5-325 MG PO TABS
2.0000 | ORAL_TABLET | Freq: Once | ORAL | Status: AC
  Administered 2012-03-13: 2 via ORAL
  Filled 2012-03-13: qty 2
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
## 2012-03-13 NOTE — ED Notes (Signed)
Pt c/o left leg pain and swelling from the knee down; pt sts x 4 days

## 2012-03-13 NOTE — ED Notes (Signed)
PA at bedside.

## 2012-03-13 NOTE — Progress Notes (Signed)
*  PRELIMINARY RESULTS* Vascular Ultrasound Left lower extremity venous duplex has been completed.  Preliminary findings: Left= No evidence of DVT or baker's cyst.  EUNICE, Marylynn Rigdon RDMS 03/13/2012, 5:54 PM

## 2012-03-13 NOTE — ED Notes (Signed)
Returned from venous dopplar

## 2012-03-14 NOTE — ED Provider Notes (Signed)
Medical screening examination/treatment/procedure(s) were performed by non-physician practitioner and as supervising physician I was immediately available for consultation/collaboration.   Loren Racer, MD 03/14/12 [phone]

## 2012-04-04 ENCOUNTER — Other Ambulatory Visit: Payer: Self-pay | Admitting: Gastroenterology

## 2012-04-08 ENCOUNTER — Telehealth: Payer: Self-pay | Admitting: Internal Medicine

## 2012-04-08 ENCOUNTER — Telehealth: Payer: Self-pay | Admitting: Gastroenterology

## 2012-04-08 NOTE — Telephone Encounter (Signed)
Pt called and wanted Dr. Arlyce Dice to change the dose of her paxil prescription. Let pt know that Dr. Arlyce Dice usually did not prescribe that medication that it was probably her PCP. Pt checked and Dr. Arlyce Dice did not prescribe it, she will contact her PCP.

## 2012-04-08 NOTE — Telephone Encounter (Signed)
Pt has edema in her legs and needs to be advised of what to do

## 2012-04-08 NOTE — Telephone Encounter (Signed)
Called patient back. She is complaining of leg and feet edema times 1 week. No more SOB than usual. Advised to see Tereso Newcomer PA on 8/14 at [facility].

## 2012-04-10 ENCOUNTER — Ambulatory Visit (INDEPENDENT_AMBULATORY_CARE_PROVIDER_SITE_OTHER): Payer: Medicare Other | Admitting: Physician Assistant

## 2012-04-10 ENCOUNTER — Encounter: Payer: Self-pay | Admitting: Physician Assistant

## 2012-04-10 VITALS — BP 135/87 | HR 92 | Ht 64.0 in | Wt 198.8 lb

## 2012-04-10 DIAGNOSIS — R0602 Shortness of breath: Secondary | ICD-10-CM

## 2012-04-10 DIAGNOSIS — R609 Edema, unspecified: Secondary | ICD-10-CM

## 2012-04-10 DIAGNOSIS — I10 Essential (primary) hypertension: Secondary | ICD-10-CM

## 2012-04-10 LAB — URINALYSIS, ROUTINE W REFLEX MICROSCOPIC
Hgb urine dipstick: NEGATIVE
Nitrite: POSITIVE
Specific Gravity, Urine: 1.02 (ref 1.000–1.030)
Total Protein, Urine: NEGATIVE
Urine Glucose: 500
Urobilinogen, UA: 0.2 (ref 0.0–1.0)

## 2012-04-10 LAB — CBC WITH DIFFERENTIAL/PLATELET
Basophils Relative: 0.4 % (ref 0.0–3.0)
Eosinophils Relative: 4 % (ref 0.0–5.0)
HCT: 34.3 % — ABNORMAL LOW (ref 36.0–46.0)
Lymphs Abs: 1.1 10*3/uL (ref 0.7–4.0)
MCHC: 32.8 g/dL (ref 30.0–36.0)
MCV: 89.5 fl (ref 78.0–100.0)
Monocytes Absolute: 0.3 10*3/uL (ref 0.1–1.0)
RBC: 3.83 Mil/uL — ABNORMAL LOW (ref 3.87–5.11)
WBC: 4.4 10*3/uL — ABNORMAL LOW (ref 4.5–10.5)

## 2012-04-10 LAB — BASIC METABOLIC PANEL
BUN: 17 mg/dL (ref 6–23)
CO2: 29 mEq/L (ref 19–32)
Chloride: 104 mEq/L (ref 96–112)
Potassium: 4.7 mEq/L (ref 3.5–5.1)

## 2012-04-10 LAB — HEPATIC FUNCTION PANEL
ALT: 54 U/L — ABNORMAL HIGH (ref 0–35)
AST: 82 U/L — ABNORMAL HIGH (ref 0–37)
Albumin: 3.7 g/dL (ref 3.5–5.2)
Alkaline Phosphatase: 39 U/L (ref 39–117)
Total Protein: 6.9 g/dL (ref 6.0–8.3)

## 2012-04-10 MED ORDER — POTASSIUM CHLORIDE CRYS ER 20 MEQ PO TBCR: 20.0000 meq | EXTENDED_RELEASE_TABLET | Freq: Two times a day (BID) | ORAL | Status: DC

## 2012-04-10 MED ORDER — FUROSEMIDE 40 MG PO TABS: 40.0000 mg | ORAL_TABLET | Freq: Two times a day (BID) | ORAL | Status: DC

## 2012-04-10 MED ORDER — POTASSIUM CHLORIDE 20 MEQ PO PACK: 20.0000 meq | PACK | Freq: Two times a day (BID) | ORAL | Status: DC

## 2012-04-10 MED ORDER — POTASSIUM CHLORIDE CRYS ER 20 MEQ PO TBCR: 20.0000 meq | EXTENDED_RELEASE_TABLET | Freq: Every day | ORAL | Status: DC

## 2012-04-10 NOTE — Progress Notes (Signed)
[address] [place], [place]  [address] Phone: [phone] Fax:  [phone]  Date:  04/10/2012   Name:  [name] [name]   DOB:  1950/11/16   MRN:  [id number]  PCP:  Elizabeth Palau, FNP  Primary Cardiologist:  Dr. Dietrich Pates  Primary Electrophysiologist:  None    History of Present Illness: [name] [name] is a 61 y.o. female who returns for evaluation of edema.  She has a history of minimal CAD by cardiac catheterization 2005 (LAD 40%), DM2, HL, GERD, HTN, chronic hepatitis C, carotid stenosis, chest pain and palpitations. Myoview 3/12: EF 70%, no ischemia. Carotid Dopplers 10/10: RICA 40-59%. She has required esophageal dilatation in the past. She is followed at the [facility] for depression. Last seen by Dr. Tenny Craw in 12/12.  She was seen in the emergency room 7/13 with complaints of LAD edema. Venous Dopplers were negative the left leg. Hemoglobin was fairly stable at 11.9, creatinine was also normal at 0.84.   She has had increasing lower extremity edema for the last several weeks. She also notes worsening shortness of breath over the last 6 months. She probably describes class IIb symptoms. She also has significant arthritic pain in her left knee. Her edema is worse on the left than the right. She denies orthopnea or PND. She has been placed on Lasix by her PCP. There has been minimal improvement in her edema. She denies syncope. She denies chest pain.  Wt Readings from Last 3 Encounters:  04/10/12 198 lb 12.8 oz (90.175 kg)  08/11/11 185 lb (83.915 kg)  03/13/11 183 lb 12.8 oz (83.371 kg)     Past Medical History  Diagnosis Date  . DM2 (diabetes mellitus, type 2)   . Hyperlipidemia   . Benign neoplasm of stomach   . Dizziness and giddiness   . Gastroparesis   . Chronic hepatitis C without mention of hepatic coma   . Esophageal reflux   . Chest pain, unspecified   . Ovarian cancer   . CAD (coronary artery disease)     cath 2005: LAD 40%;   Myoview 3/12: EF 70%, no ischemia  . HTN (hypertension)   . Palpitations   . Depression     Current Outpatient Prescriptions  Medication Sig Dispense Refill  . albuterol (PROVENTIL HFA) 108 (90 BASE) MCG/ACT inhaler Inhale 2 puffs into the lungs every 6 (six) hours as needed. For shortness of breath      . ARIPiprazole (ABILIFY) 2 MG tablet Take 2 mg by mouth daily.        [name] aspirin 81 MG tablet Take 81 mg by mouth daily.        [name] DEXILANT 60 MG capsule TAKE 1 CAPSULE BY MOUTH EVERY DAY BEFORE BREAKFAST  30 capsule  0  . famotidine (PEPCID) 20 MG tablet Take 20 mg by mouth at bedtime.       [name] lisinopril (PRINIVIL,ZESTRIL) 20 MG tablet TAKE 1 TABLET BY MOUTH EVERY DAY  30 tablet  5  . meloxicam (MOBIC) 7.5 MG tablet Take 7.5 mg by mouth as directed.      . naproxen (NAPROSYN) 250 MG tablet Take 250 mg by mouth 2 (two) times daily with a meal.       . nitroGLYCERIN (NITROSTAT) 0.4 MG SL tablet Place 0.4 mg under the tongue every 5 (five) minutes as needed. For chest pain      . PARoxetine (PAXIL-CR) 25 MG 24 hr tablet Take 50 mg by  mouth at bedtime.       . pindolol (VISKEN) 5 MG tablet Take 5 mg by mouth. Take 1 tab in the AM  and 1/2 tab PM       . polyethylene glycol (MIRALAX / GLYCOLAX) packet Take 17 g by mouth daily as needed. For constipation      . pravastatin (PRAVACHOL) 80 MG tablet Take 1 tablet (80 mg total) by mouth daily.  30 tablet  11  . Riboflavin 100 MG CAPS Take 2 capsules by mouth 2 (two) times daily.       . solifenacin (VESICARE) 5 MG tablet Take 5 mg by mouth daily.        [name] tiZANidine (ZANAFLEX) 4 MG tablet Take 2 mg by mouth every 8 (eight) hours as needed. For pain        Allergies: Allergies  Allergen Reactions  . Metoclopramide Hcl     REACTION: unspecified  . Metronidazole     REACTION: unspecified  . Sulfamethoxazole     REACTION: unspecified    History  Substance Use Topics  . Smoking status: Never Smoker   . Smokeless tobacco: Never Used  .  Alcohol Use: No     ROS:  Please see the history of present illness.   She had some throat discomfort yesterday and some blurry vision. She was dizzy the night before. She's had diarrhea recently.  All other systems reviewed and negative.   PHYSICAL EXAM: VS:  BP 135/87  Pulse 92  Ht 5\' 4"  (1.626 m)  Wt 198 lb 12.8 oz (90.175 kg)  BMI 34.12 kg/m2 Well nourished, well developed, in no acute distress HEENT: normal Neck: + JVD Cardiac:  normal S1, S2; RRR; no murmur; no S3 Lungs:  clear to auscultation bilaterally, no wheezing, rhonchi or rales Abd: soft, nontender, no hepatomegaly Ext: 1-2+ bilateral LE edema, left greater than the right Skin: warm and dry Neuro:  CNs 2-12 intact, no focal abnormalities noted  EKG:  Sinus rhythm, heart rate 91, normal axis, incomplete right bundle branch block, nonspecific ST-T wave changes, no change from prior tracing      ASSESSMENT AND PLAN:  1. Edema Her JVP is up and she has lower extremity edema. She has not had this in the past. She does have significant arthritis in her left knee which is likely worsening her edema on the left. I do not see that she has had an echocardiogram. She has had normal LV function the past. Question if she is diastolic CHF. I will obtain an echocardiogram. I will also obtain a basic metabolic panel, BNP, CBC, TSH, LFTs and a urinalysis to rule out proteinuria. Increase Lasix to 40 mg twice a day. Add potassium 20 mEq twice a day. Check a basic metabolic panel in one week.  2. Shortness of Breath Obtain echo and adjust Lasix as noted. Plan followup with either me or Dr. Tenny Craw in 2-3 weeks.  3. Hypertension Adjust medications as noted.  4. Hyperlipidemia Continue current therapy.  5. Degenerative Joint Disease Followup with orthopedics as indicated.  Signed, Tereso Newcomer, PA-C  2:38 PM 04/10/2012

## 2012-04-10 NOTE — Patient Instructions (Addendum)
Your physician has recommended you make the following change in your medication: INCREASE LASIX TO 40 MG TWICE DAILY, START POTASSIUM 20 MEQ TWICE DAILY  Your physician recommends that you return for lab work in: TODAY BMET, CBC W/DIFF, TSH, BNP, LFT AND U/A TO LOOK FOR PROTEIN  Your physician recommends that you return for lab work in: 1 WEEK REPEAT BMET  Your physician has requested that you have an echocardiogram DX 786.05. Echocardiography is a painless test that uses sound waves to create images of your heart. It provides your doctor with information about the size and shape of your heart and how well your heart's chambers and valves are working. This procedure takes approximately one hour. There are no restrictions for this procedure.   Your physician recommends that you schedule a follow-up appointment in: 2-3 WEEKS WITH DR. ROSS OR SCOTT WEAVER, PAC SAME DAY DR. Tenny Craw IS IN THE OFFICE

## 2012-04-11 ENCOUNTER — Ambulatory Visit: Payer: Medicare Other

## 2012-04-11 DIAGNOSIS — R3589 Other polyuria: Secondary | ICD-10-CM

## 2012-04-11 DIAGNOSIS — R358 Other polyuria: Secondary | ICD-10-CM

## 2012-04-14 LAB — URINE CULTURE: Colony Count: >=100000

## 2012-04-16 ENCOUNTER — Telehealth: Payer: Self-pay | Admitting: *Deleted

## 2012-04-16 NOTE — Telephone Encounter (Signed)
LMOM for call back concerning positive urine culture from 8/15 and need for pharmacy name.

## 2012-04-17 ENCOUNTER — Ambulatory Visit (HOSPITAL_COMMUNITY): Payer: Medicare Other | Attending: Cardiology

## 2012-04-17 ENCOUNTER — Other Ambulatory Visit (INDEPENDENT_AMBULATORY_CARE_PROVIDER_SITE_OTHER): Payer: Medicare Other

## 2012-04-17 DIAGNOSIS — R0602 Shortness of breath: Secondary | ICD-10-CM

## 2012-04-17 DIAGNOSIS — R0989 Other specified symptoms and signs involving the circulatory and respiratory systems: Secondary | ICD-10-CM | POA: Insufficient documentation

## 2012-04-17 DIAGNOSIS — I079 Rheumatic tricuspid valve disease, unspecified: Secondary | ICD-10-CM | POA: Insufficient documentation

## 2012-04-17 DIAGNOSIS — E785 Hyperlipidemia, unspecified: Secondary | ICD-10-CM | POA: Insufficient documentation

## 2012-04-17 DIAGNOSIS — I10 Essential (primary) hypertension: Secondary | ICD-10-CM

## 2012-04-17 DIAGNOSIS — I379 Nonrheumatic pulmonary valve disorder, unspecified: Secondary | ICD-10-CM | POA: Insufficient documentation

## 2012-04-17 DIAGNOSIS — R609 Edema, unspecified: Secondary | ICD-10-CM

## 2012-04-17 DIAGNOSIS — I359 Nonrheumatic aortic valve disorder, unspecified: Secondary | ICD-10-CM | POA: Insufficient documentation

## 2012-04-17 DIAGNOSIS — R0609 Other forms of dyspnea: Secondary | ICD-10-CM

## 2012-04-17 DIAGNOSIS — I2510 Atherosclerotic heart disease of native coronary artery without angina pectoris: Secondary | ICD-10-CM | POA: Insufficient documentation

## 2012-04-17 DIAGNOSIS — E119 Type 2 diabetes mellitus without complications: Secondary | ICD-10-CM | POA: Insufficient documentation

## 2012-04-17 LAB — BASIC METABOLIC PANEL
BUN: 37 mg/dL — ABNORMAL HIGH (ref 6–23)
Chloride: 102 mEq/L (ref 96–112)
Creatinine, Ser: 1.4 mg/dL — ABNORMAL HIGH (ref 0.4–1.2)
Glucose, Bld: 424 mg/dL — ABNORMAL HIGH (ref 70–99)
Potassium: 4.6 mEq/L (ref 3.5–5.1)

## 2012-04-17 MED ORDER — CIPROFLOXACIN HCL 250 MG PO TABS: 250.0000 mg | ORAL_TABLET | Freq: Two times a day (BID) | ORAL | Status: AC

## 2012-04-17 NOTE — Telephone Encounter (Signed)
Chart reviewed by Norma Fredrickson NP, orders followed, pt will be in today for echo and will inform her then, pt was told to ask for me when she arrives, pt agreed to plan. Echo updated that pt will ask for me to inform her on medication for UTI and will be informed to f/u with PCP in 3-5 days once medication is gone for f/u UA.

## 2012-04-17 NOTE — Telephone Encounter (Signed)
Confirmed pharmacy, paged Dr Tenny Craw to prescribe.

## 2012-04-17 NOTE — Progress Notes (Signed)
Echocardiogram performed.  

## 2012-04-17 NOTE — Telephone Encounter (Signed)
Patient returning nurse call, she can be reached at hm# until 9:30 am

## 2012-04-20 ENCOUNTER — Other Ambulatory Visit: Payer: Self-pay | Admitting: Internal Medicine

## 2012-04-21 ENCOUNTER — Encounter: Payer: Self-pay | Admitting: Physician Assistant

## 2012-04-22 ENCOUNTER — Telehealth: Payer: Self-pay | Admitting: *Deleted

## 2012-04-22 ENCOUNTER — Ambulatory Visit: Payer: Medicare Other | Admitting: Gastroenterology

## 2012-04-22 NOTE — Telephone Encounter (Signed)
Message copied by Tarri Fuller on Mon Apr 22, 2012  2:26 PM ------      Message from: [place], [place] T      Created: Sun Apr 21, 2012  7:44 AM       Agree with plan      Get follow up bmet 7-10 days later      Make sure she follows up with Elizabeth Palau, FNP for diabetes      Tereso Newcomer, PA-C  7:44 AM 04/21/2012

## 2012-04-22 NOTE — Telephone Encounter (Signed)
pt notified of needing bmet 7-10 days fter decreasing lasix. pt states she is going to PCP on Thursday and asked if she could just have them get bmet, I said that was fine and that I will call PCP and give order for bmet.  I s/w PCP office and they stated pt already scheduled for bmet and they will fax results to [place] [facility].

## 2012-04-23 ENCOUNTER — Other Ambulatory Visit: Payer: Self-pay | Admitting: Internal Medicine

## 2012-04-25 ENCOUNTER — Telehealth: Payer: Self-pay | Admitting: *Deleted

## 2012-04-25 NOTE — Telephone Encounter (Signed)
Pt would like to speak with nurse regarding Echo results. Pt also needsfenofibrate (TRICOR)  RX sent to [facility] on [address].

## 2012-04-25 NOTE — Telephone Encounter (Signed)
LMOM with patient to call back if she still has a question concerning her last echo that was done.

## 2012-04-26 NOTE — Telephone Encounter (Signed)
Pt rtn call to jackie from yesterday, pls call

## 2012-04-26 NOTE — Telephone Encounter (Signed)
Reviewed echo results from 8/21.

## 2012-05-04 ENCOUNTER — Other Ambulatory Visit: Payer: Self-pay | Admitting: Gastroenterology

## 2012-05-10 ENCOUNTER — Ambulatory Visit (INDEPENDENT_AMBULATORY_CARE_PROVIDER_SITE_OTHER): Payer: Medicare Other | Admitting: Physician Assistant

## 2012-05-10 ENCOUNTER — Encounter: Payer: Self-pay | Admitting: Physician Assistant

## 2012-05-10 VITALS — BP 122/68 | HR 85 | Ht 65.0 in | Wt 195.8 lb

## 2012-05-10 DIAGNOSIS — R609 Edema, unspecified: Secondary | ICD-10-CM

## 2012-05-10 DIAGNOSIS — I10 Essential (primary) hypertension: Secondary | ICD-10-CM

## 2012-05-10 DIAGNOSIS — R079 Chest pain, unspecified: Secondary | ICD-10-CM

## 2012-05-10 DIAGNOSIS — R0602 Shortness of breath: Secondary | ICD-10-CM

## 2012-05-10 DIAGNOSIS — I2510 Atherosclerotic heart disease of native coronary artery without angina pectoris: Secondary | ICD-10-CM

## 2012-05-10 MED ORDER — PINDOLOL 5 MG PO TABS: 5.0000 mg | ORAL_TABLET | ORAL | Status: DC

## 2012-05-10 NOTE — Patient Instructions (Addendum)
PLEASE SCHEDULE A MYOCARDIAL PERFUSION IMAGING (LEXISCAN) DX 786.05, CAD, PRIMARY CARD IS DR. ROSS   A REFILL FOR PINDOLOL WAS SENT IN TODAY  KEEP LEGS ELEVATED   PLEASE FOLLOW UP WITH DR. ROSS 08/09/12 @ 11:30 AM

## 2012-05-10 NOTE — Progress Notes (Signed)
[address] [place], [place]  [address] Phone: [phone] Fax:  [phone]  Date:  05/10/2012   Name:  [name] [name]   DOB:  1951-08-09   MRN:  [id number]  PCP:  Elizabeth Palau, FNP  Primary Cardiologist:  Dr. Dietrich Pates  Primary Electrophysiologist:  None    History of Present Illness: [name] [name] is a 61 y.o. female who returns for f/u of edema.  She has a hx of minimal CAD by cath in 2005 (LAD 40%), DM2, HL, GERD, HTN, chronic HCV, carotid stenosis, chest pain and palpitations. Myoview 3/12: EF 70%, no ischemia. Carotid Dopplers 10/10: RICA 40-59%. She has required esophageal dilatation in the past. She is followed at the [facility] for depression. Last seen by Dr. Tenny Craw in 12/12.  She was seen in the emergency room 7/13 with complaints of LE edema. Venous Dopplers were negative the left leg. Hemoglobin was fairly stable at 11.9, creatinine was also normal at 0.84.   I saw her in follow up on 04/10/12.  I increased her Lasix. BNP was normal. Urine was positive for UTI. Creatinine was elevated on followup and her Lasix was reduced. Hgb was low and she is to see her PCP. Echo demonstrated normal LVF, mild AS, mild LVH.  She is doing well.  Notes LE edema better.  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  Notes occ chest pain.  Non-exertional.  Notes worsening dyspnea with exertion.  Also notes wheezing at times.  No syncope.  No orthopnea, PND.  Wt Readings from Last 3 Encounters:  05/10/12 195 lb 12.8 oz (88.814 kg)  04/10/12 198 lb 12.8 oz (90.175 kg)  08/11/11 185 lb (83.915 kg)     Past Medical History  Diagnosis Date  . DM2 (diabetes mellitus, type 2)   . Hyperlipidemia   . Benign neoplasm of stomach   . Dizziness and giddiness   . Gastroparesis   . Chronic hepatitis C without mention of hepatic coma   . Esophageal reflux   . Chest pain, unspecified   . Ovarian cancer   . CAD (coronary artery disease)     cath 2005: LAD 40%;  Myoview 3/12: EF  70%, no ischemia  . HTN (hypertension)   . Palpitations   . Depression   . Aortic stenosis     Echo 8/13: EF 55-60%, Mild LVH, mild AS, mean gradient 12 mmHg,     Current Outpatient Prescriptions  Medication Sig Dispense Refill  . albuterol (PROVENTIL HFA) 108 (90 BASE) MCG/ACT inhaler Inhale 2 puffs into the lungs every 6 (six) hours as needed. For shortness of breath      . ARIPiprazole (ABILIFY) 2 MG tablet Take 2 mg by mouth daily.        . Ascorbic Acid (VITAMIN C PO) Take by mouth daily.      [name] aspirin 81 MG tablet Take 81 mg by mouth daily.        [name] DEXILANT 60 MG capsule TAKE 1 CAPSULE BY MOUTH EVERY DAY BEFORE BREAKFAST  30 capsule  0  . famotidine (PEPCID) 20 MG tablet Take 20 mg by mouth at bedtime.       . fenofibrate (TRICOR) 145 MG tablet TAKE 1 TABLET BY MOUTH EVERY DAY  30 tablet  0  . furosemide (LASIX) 40 MG tablet Take 40 mg by mouth daily.      [name] lisinopril (PRINIVIL,ZESTRIL) 20 MG tablet TAKE 1 TABLET BY MOUTH EVERY DAY  30 tablet  5  . magnesium oxide (MAG-OX) 400 MG tablet Take 400 mg by mouth daily.      . meloxicam (MOBIC) 7.5 MG tablet Take 7.5 mg by mouth as directed.      . naproxen (NAPROSYN) 250 MG tablet Take 250 mg by mouth 2 (two) times daily with a meal.       . nitroGLYCERIN (NITROSTAT) 0.4 MG SL tablet Place 0.4 mg under the tongue every 5 (five) minutes as needed. For chest pain      . PARoxetine (PAXIL-CR) 25 MG 24 hr tablet Take 50 mg by mouth at bedtime.       . pindolol (VISKEN) 5 MG tablet Take 5 mg by mouth. Take 1 tab in the AM  and 1/2 tab PM       . Polyethyl Glycol-Propyl Glycol (SYSTANE) 0.4-0.3 % GEL Apply to eye as directed.      . polyethylene glycol (MIRALAX / GLYCOLAX) packet Take 17 g by mouth daily as needed. For constipation      . pravastatin (PRAVACHOL) 80 MG tablet Take 1 tablet (80 mg total) by mouth daily.  30 tablet  11  . Riboflavin 100 MG CAPS Take 2 capsules by mouth 2 (two) times daily.       . solifenacin (VESICARE) 5 MG  tablet Take 5 mg by mouth daily.        [name] sulfamethoxazole-trimethoprim (BACTRIM DS) 800-160 MG per tablet Take 1 tablet by mouth 2 (two) times daily.      [name] tiZANidine (ZANAFLEX) 4 MG tablet Take 2 mg by mouth every 8 (eight) hours as needed. For pain      . traMADol (ULTRAM) 50 MG tablet Take 50 mg by mouth every 6 (six) hours as needed.      [name] DISCONTD: furosemide (LASIX) 40 MG tablet Take 1 tablet (40 mg total) by mouth 2 (two) times daily.  60 tablet  11    Allergies: Allergies  Allergen Reactions  . Metoclopramide Hcl     REACTION: unspecified  . Metronidazole     REACTION: unspecified  . Sulfamethoxazole     REACTION: unspecified    History  Substance Use Topics  . Smoking status: Never Smoker   . Smokeless tobacco: Never Used  . Alcohol Use: No     ROS:  Please see the history of present illness.   All other systems reviewed and negative.   PHYSICAL EXAM: VS:  BP 122/68  Pulse 85  Ht 5\' 5"  (1.651 m)  Wt 195 lb 12.8 oz (88.814 kg)  BMI 32.58 kg/m2 Well nourished, well developed, in no acute distress HEENT: normal Neck: no JVD Cardiac:  normal S1, S2; RRR; no murmur; no S3 Lungs:  clear to auscultation bilaterally, no wheezing, rhonchi or rales Abd: soft, nontender, no hepatomegaly Ext: trace to 1+ bilateral LE edema, left greater than the right Skin: warm and dry Neuro:  CNs 2-12 intact, no focal abnormalities noted  EKG:  Sinus rhythm, heart rate 85, normal axis, incomplete right bundle branch block, nonspecific ST-T wave changes, no change from prior tracing      ASSESSMENT AND PLAN:  1. Edema:  Likely related to knee DJD and increased use of NSAIDs.  Normal LVF on echo and normal BNP.  Continue current dose of lasix.  Keep legs elevated.  Avoid overuse of NSAIDs.  2. Shortness of Breath:  Echo and BNP ok.  She wants to know if it is time to have another heart cath.  She has atypical chest pain.  Cannot walk on treadmill due to knee pain.  Will go ahead and  get [employer]  3. Hypertension:  Controlled.  Continue current therapy.   4. Coronary Artery Disease:  Follow up with Dr. Dietrich Pates in 3 mos, or sooner if myoview abnormal.    5. Anemia:  Follow up with primary care.    Signed, Tereso Newcomer, PA-C  11:51 AM 05/10/2012

## 2012-05-16 ENCOUNTER — Ambulatory Visit (HOSPITAL_COMMUNITY): Payer: Medicare Other | Attending: Cardiology | Admitting: Radiology

## 2012-05-16 VITALS — BP 124/79 | HR 79 | Ht 65.0 in | Wt 193.0 lb

## 2012-05-16 DIAGNOSIS — R0609 Other forms of dyspnea: Secondary | ICD-10-CM | POA: Insufficient documentation

## 2012-05-16 DIAGNOSIS — R0989 Other specified symptoms and signs involving the circulatory and respiratory systems: Secondary | ICD-10-CM | POA: Insufficient documentation

## 2012-05-16 DIAGNOSIS — R002 Palpitations: Secondary | ICD-10-CM | POA: Insufficient documentation

## 2012-05-16 DIAGNOSIS — I2510 Atherosclerotic heart disease of native coronary artery without angina pectoris: Secondary | ICD-10-CM | POA: Insufficient documentation

## 2012-05-16 DIAGNOSIS — R0602 Shortness of breath: Secondary | ICD-10-CM

## 2012-05-16 DIAGNOSIS — R42 Dizziness and giddiness: Secondary | ICD-10-CM | POA: Insufficient documentation

## 2012-05-16 DIAGNOSIS — R61 Generalized hyperhidrosis: Secondary | ICD-10-CM | POA: Insufficient documentation

## 2012-05-16 DIAGNOSIS — I10 Essential (primary) hypertension: Secondary | ICD-10-CM | POA: Insufficient documentation

## 2012-05-16 DIAGNOSIS — R079 Chest pain, unspecified: Secondary | ICD-10-CM | POA: Insufficient documentation

## 2012-05-16 MED ORDER — TECHNETIUM TC 99M SESTAMIBI GENERIC - CARDIOLITE
33.0000 | Freq: Once | INTRAVENOUS | Status: AC | PRN
  Administered 2012-05-16: 33 via INTRAVENOUS

## 2012-05-16 MED ORDER — TECHNETIUM TC 99M SESTAMIBI GENERIC - CARDIOLITE
11.0000 | Freq: Once | INTRAVENOUS | Status: AC | PRN
  Administered 2012-05-16: 11 via INTRAVENOUS

## 2012-05-16 MED ORDER — REGADENOSON 0.4 MG/5ML IV SOLN
0.4000 mg | Freq: Once | INTRAVENOUS | Status: AC
  Administered 2012-05-16: 0.4 mg via INTRAVENOUS

## 2012-05-16 NOTE — Progress Notes (Signed)
[facility] SITE 3 NUCLEAR MED [address] [place] [address] [phone]  Cardiology Nuclear Med Study  [name] [name] is a 61 y.o. female     MRN : [id number]     DOB: [birth year]  Procedure Date: 05/16/2012  Nuclear Med Background Indication for Stress Test:  Evaluation for Ischemia History:  '05 Cath:Mild n/o CAD; '12 MPS:No ischemia, EF=70%; 8/13 Echo:EF=60%, mild AS Cardiac Risk Factors: Carotid Disease, History of Smoking, Hypertension and Lipids  Symptoms:  Chest Pain (last episode of chest discomfort was about 2-days ago), Diaphoresis, Dizziness, DOE/SOB, Fatigue, Palpitations and Rapid HR    Nuclear Pre-Procedure Caffeine/Decaff Intake:  None > 12 hrs NPO After: 9:00pm   Lungs:  Clear. O2 Sat: 94% on room air. IV 0.9% NS with Angio Cath:  20g  IV Site: R Wrist x 1, tolerated well IV Started by:  Irean Hong, RN  Chest Size (in):  42 Cup Size: B  Height: 5\' 5"  (1.651 m)  Weight:  193 lb (87.544 kg)  BMI:  Body mass index is 32.12 kg/(m^2). Tech Comments:  Held pindolol this am    Nuclear Med Study 1 or 2 day study: 1 day  Stress Test Type:  Lexiscan  Reading MD: Willa Rough, MD  Order Authorizing Provider:  Dietrich Pates, MD, and Tereso Newcomer, PA-C  Resting Radionuclide: Technetium 33m Sestamibi  Resting Radionuclide Dose: 11.0 mCi   Stress Radionuclide:  Technetium 22m Sestamibi  Stress Radionuclide Dose: 33.0 mCi           Stress Protocol Rest HR: 79 Stress HR: 97  Rest BP: 124/79 Stress BP: 128/78  Exercise Time (min): n/a METS: n/a   Predicted Max HR: 160 bpm % Max HR: 60.62 bpm Rate Pressure Product: 21308   Dose of Adenosine (mg):  n/a Dose of Lexiscan: 0.4 mg  Dose of Atropine (mg): n/a Dose of Dobutamine: n/a mcg/kg/min (at max HR)  Stress Test Technologist: Smiley Houseman, CMA-N  Nuclear Technologist:  Domenic Polite, CNMT     Rest Procedure:  Myocardial perfusion imaging was performed at rest 45 minutes following the  intravenous administration of Technetium 42m Sestamibi.  Rest ECG: Nonspecific T-wave changes.  Stress Procedure:  The patient received IV Lexiscan 0.4 mg over 15-seconds.  Technetium 69m Sestamibi injected at 30-seconds.  There were no significant changes with Lexiscan.  She c/o chest tightness, 7/10, with Lexiscan.   Quantitative spect images were obtained after a 45 minute delay.  Stress ECG: No significant ST segment change suggestive of ischemia.  QPS Raw Data Images:  Normal; no motion artifact; normal heart/lung ratio. Stress Images:  Normal homogeneous uptake in all areas of the myocardium. Rest Images:  Normal homogeneous uptake in all areas of the myocardium. Subtraction (SDS):  No evidence of ischemia. Transient Ischemic Dilatation (Normal <1.22):  0.96 Lung/Heart Ratio (Normal <0.45):  0.25  Quantitative Gated Spect Images QGS EDV:  73 ml QGS ESV:  17 ml  Impression Exercise Capacity:  Lexiscan with no exercise. BP Response:  Normal blood pressure response. Clinical Symptoms:  chest pain 7/10. ECG Impression:  No significant ST segment change suggestive of ischemia. Comparison with Prior Nuclear Study: No images to compare  Overall Impression:  Normal stress nuclear study. The patient did have chest pain with infusion. The nuclear images are normal.  LV Ejection Fraction: 76%.  LV Wall Motion:  Normal Wall Motion  Willa Rough, MD

## 2012-05-17 ENCOUNTER — Telehealth: Payer: Self-pay | Admitting: *Deleted

## 2012-05-17 ENCOUNTER — Encounter: Payer: Self-pay | Admitting: Physician Assistant

## 2012-05-17 NOTE — Telephone Encounter (Signed)
pt notified about stress test results w/verbal understanding today

## 2012-05-17 NOTE — Telephone Encounter (Signed)
Message copied by Tarri Fuller on Fri May 17, 2012  4:38 PM ------      Message from: [place], [place] T      Created: Fri May 17, 2012  1:33 PM       Please inform patient stress test normal.      Tereso Newcomer, PA-C  1:33 PM 05/17/2012

## 2012-05-21 ENCOUNTER — Other Ambulatory Visit: Payer: Self-pay | Admitting: Internal Medicine

## 2012-05-22 ENCOUNTER — Ambulatory Visit (INDEPENDENT_AMBULATORY_CARE_PROVIDER_SITE_OTHER): Payer: Medicare Other | Admitting: Gastroenterology

## 2012-05-22 ENCOUNTER — Encounter: Payer: Self-pay | Admitting: Gastroenterology

## 2012-05-22 VITALS — BP 132/60 | HR 80 | Ht 65.0 in | Wt 192.0 lb

## 2012-05-22 DIAGNOSIS — K3184 Gastroparesis: Secondary | ICD-10-CM

## 2012-05-22 DIAGNOSIS — R1310 Dysphagia, unspecified: Secondary | ICD-10-CM

## 2012-05-22 MED ORDER — ERYTHROMYCIN BASE 250 MG PO TABS: 250.0000 mg | ORAL_TABLET | Freq: Four times a day (QID) | ORAL | Status: DC

## 2012-05-22 MED ORDER — DEXLANSOPRAZOLE 60 MG PO CPDR: DELAYED_RELEASE_CAPSULE | ORAL | Status: DC

## 2012-05-22 NOTE — Progress Notes (Signed)
History of Present Illness:  [name] [name] has returned for evaluation of dysphagia. She's complaining of dysphagia to solids. Colonoscopy in 2005 was normal. In 2010 she underwent dilation of a distal esophageal stricture.  She's complaining of nausea and severe indigestion despite taking dexilant. She has well-established gastroparesis. She is intolerant to Reglan.  She also complains of intermittent diarrhea   Review of Systems: Pertinent positive and negative review of systems were noted in the above HPI section. All other review of systems were otherwise negative.    Current Medications, Allergies, Past Medical History, Past Surgical History, Family History and Social History were reviewed in [employer] electronic medical record  Vital signs were reviewed in today's medical record. Physical Exam: General: Well developed , well nourished, no acute distress

## 2012-05-22 NOTE — Assessment & Plan Note (Signed)
This may be responsible for nausea and worsening of pyrosis.  Recommendations #1 trial of erythromycin

## 2012-05-22 NOTE — Patient Instructions (Signed)
You have been scheduled for an endoscopy with propofol. Please follow written instructions given to you at your visit today. If you use inhalers (even only as needed), please bring them with you on the day of your procedure.  Esophagogastroduodenoscopy This is an endoscopic procedure (a procedure that uses a device like a flexible telescope) that allows your caregiver to view the upper stomach and small bowel. This test allows your caregiver to look at the esophagus. The esophagus carries food from your mouth to your stomach. They can also look at your duodenum. This is the first part of the small intestine that attaches to the stomach. This test is used to detect problems in the bowel such as ulcers and inflammation. PREPARATION FOR TEST Nothing to eat after midnight the day before the test. NORMAL FINDINGS Normal esophagus, stomach, and duodenum. Ranges for normal findings may vary among different laboratories and hospitals. You should always check with your doctor after having lab work or other tests done to discuss the meaning of your test results and whether your values are considered within normal limits. MEANING OF TEST  Your caregiver will go over the test results with you and discuss the importance and meaning of your results, as well as treatment options and the need for additional tests if necessary. OBTAINING THE TEST RESULTS It is your responsibility to obtain your test results. Ask the lab or department performing the test when and how you will get your results. Document Released: 12/15/2004 Document Revised: 08/03/2011 Document Reviewed: 07/24/2008 [facility] Patient Information 2012 [place], [place].

## 2012-05-22 NOTE — Assessment & Plan Note (Signed)
Probably due to a recurrent esophageal stricture.  Recommendations #1 upper endoscopy with dilatation

## 2012-05-24 ENCOUNTER — Emergency Department (HOSPITAL_COMMUNITY): Payer: Medicare Other

## 2012-05-24 ENCOUNTER — Encounter (HOSPITAL_COMMUNITY): Payer: Self-pay | Admitting: Emergency Medicine

## 2012-05-24 ENCOUNTER — Telehealth: Payer: Self-pay | Admitting: Internal Medicine

## 2012-05-24 ENCOUNTER — Emergency Department (HOSPITAL_COMMUNITY)
Admission: EM | Admit: 2012-05-24 | Discharge: 2012-05-25 | Disposition: A | Discharge status: Home / Self Care | Payer: Medicare Other | Attending: Emergency Medicine | Admitting: Emergency Medicine

## 2012-05-24 DIAGNOSIS — I2510 Atherosclerotic heart disease of native coronary artery without angina pectoris: Secondary | ICD-10-CM | POA: Insufficient documentation

## 2012-05-24 DIAGNOSIS — M7918 Myalgia, other site: Secondary | ICD-10-CM

## 2012-05-24 DIAGNOSIS — E119 Type 2 diabetes mellitus without complications: Secondary | ICD-10-CM | POA: Insufficient documentation

## 2012-05-24 DIAGNOSIS — IMO0001 Reserved for inherently not codable concepts without codable children: Secondary | ICD-10-CM | POA: Insufficient documentation

## 2012-05-24 DIAGNOSIS — Z79899 Other long term (current) drug therapy: Secondary | ICD-10-CM | POA: Insufficient documentation

## 2012-05-24 DIAGNOSIS — R0602 Shortness of breath: Secondary | ICD-10-CM | POA: Insufficient documentation

## 2012-05-24 DIAGNOSIS — I10 Essential (primary) hypertension: Secondary | ICD-10-CM | POA: Insufficient documentation

## 2012-05-24 DIAGNOSIS — R0789 Other chest pain: Secondary | ICD-10-CM | POA: Insufficient documentation

## 2012-05-24 DIAGNOSIS — Z9089 Acquired absence of other organs: Secondary | ICD-10-CM | POA: Insufficient documentation

## 2012-05-24 NOTE — Telephone Encounter (Signed)
Pt would like return call concerning SOB, tingling  And burning across chest back and arms, chest discomfort [phone]

## 2012-05-24 NOTE — ED Notes (Signed)
EKG completed and given to Dr. Bednar along with OLD ekg. 

## 2012-05-24 NOTE — ED Notes (Signed)
C/o R sided chest pain that radiates to back and into R arm/ shoulder x 2 hours.  Reports increased anxiety over the last 2 days. Reports chronic sob.  Denies nausea and vomiting.

## 2012-05-24 NOTE — Telephone Encounter (Signed)
Advised pt to go to er.

## 2012-05-25 LAB — POCT I-STAT TROPONIN I: Troponin i, poc: 0 ng/mL (ref 0.00–0.08)

## 2012-05-25 LAB — COMPREHENSIVE METABOLIC PANEL
ALT: 51 U/L — ABNORMAL HIGH (ref 0–35)
AST: 58 U/L — ABNORMAL HIGH (ref 0–37)
CO2: 22 mEq/L (ref 19–32)
Calcium: 9.3 mg/dL (ref 8.4–10.5)
Creatinine, Ser: 0.86 mg/dL (ref 0.50–1.10)
GFR calc non Af Amer: 72 mL/min — ABNORMAL LOW (ref >90)
Sodium: 135 mEq/L (ref 135–145)
Total Protein: 7.1 g/dL (ref 6.0–8.3)

## 2012-05-25 LAB — CBC
MCH: 30.2 pg (ref 26.0–34.0)
MCHC: 34 g/dL (ref 30.0–36.0)
MCV: 88.9 fL (ref 78.0–100.0)
Platelets: 222 10*3/uL (ref 150–400)
RBC: 3.97 MIL/uL (ref 3.87–5.11)
RDW: 13.6 % (ref 11.5–15.5)

## 2012-05-25 MED ORDER — LORAZEPAM 1 MG PO TABS
1.0000 mg | ORAL_TABLET | Freq: Once | ORAL | Status: AC
  Administered 2012-05-25: 1 mg via ORAL
  Filled 2012-05-25: qty 1

## 2012-05-25 MED ORDER — LORAZEPAM 1 MG PO TABS: 1.0000 mg | ORAL_TABLET | Freq: Three times a day (TID) | ORAL | Status: DC | PRN

## 2012-05-25 MED ORDER — TRAMADOL HCL 50 MG PO TABS
50.0000 mg | ORAL_TABLET | Freq: Once | ORAL | Status: AC
  Administered 2012-05-25: 50 mg via ORAL
  Filled 2012-05-25: qty 1

## 2012-05-25 MED ORDER — TRAMADOL HCL 50 MG PO TABS: 50.0000 mg | ORAL_TABLET | Freq: Four times a day (QID) | ORAL | Status: DC | PRN

## 2012-05-25 NOTE — ED Provider Notes (Signed)
History     CSN: [id number]  Arrival date & time 05/24/12  7829   First MD Initiated Contact with Patient 05/24/12 2303      Chief Complaint  Patient presents with  . Chest Pain    (Consider location/radiation/quality/duration/timing/severity/associated sxs/prior treatment) HPI Pt states she had burning R upper chest, R shoulder and R upper back pain starting at 1600. Pain has now resolved. Pt has chronic SOB which is unchanged. Pt had negative stress test last week. She now state she has upper R thoracic "tightness" worse with movement. No cough, fever, chills, lower ext swelling or pain.  Past Medical History  Diagnosis Date  . DM2 (diabetes mellitus, type 2)   . Hyperlipidemia   . Benign neoplasm of stomach   . Dizziness and giddiness   . Gastroparesis   . Chronic hepatitis C without mention of hepatic coma   . Esophageal reflux   . Chest pain, unspecified   . Ovarian cancer   . CAD (coronary artery disease)     a.  cath 2005: LAD 40%;  b. Myoview 3/12: EF 70%, no ischemia;  c. Myoview 9/13: EF 76%, no ischemia  . HTN (hypertension)   . Palpitations   . Depression   . Aortic stenosis     Echo 8/13: EF 55-60%, Mild LVH, mild AS, mean gradient 12 mmHg,     Past Surgical History  Procedure Date  . Cholecystectomy   . Tubal ligation   . Breast biopsy     left  . Abdominal hysterectomy   . Oophorectomy   . Tonsillectomy     Family History  Problem Relation Age of Onset  . Brain cancer Father     spine cancer  . Heart disease Mother   . Breast cancer Sister   . Kidney disease Sister   . Diabetes Brother   . Esophageal cancer Brother   . Diabetes Maternal Aunt   . Diabetes Paternal Aunt   . Stomach cancer Paternal Grandfather     History  Substance Use Topics  . Smoking status: Never Smoker   . Smokeless tobacco: Never Used  . Alcohol Use: No    OB History    Grav Para Term Preterm Abortions TAB SAB Ect Mult Living                  Review of  Systems  Constitutional: Negative for fever and chills.  Respiratory: Negative for cough and shortness of breath.   Cardiovascular: Positive for chest pain.  Gastrointestinal: Negative for nausea, vomiting and abdominal pain.  Musculoskeletal: Positive for myalgias and back pain.  Skin: Negative for rash and wound.  Neurological: Negative for dizziness, weakness, light-headedness, numbness and headaches.  Psychiatric/Behavioral: The patient is nervous/anxious.     Allergies  Sunflower oil; Iodine; Metoclopramide hcl; Metronidazole; and Sulfamethoxazole  Home Medications   Current Outpatient Rx  Name Route Sig Dispense Refill  . ALBUTEROL SULFATE HFA 108 (90 BASE) MCG/ACT IN AERS Inhalation Inhale 2 puffs into the lungs every 6 (six) hours as needed. For shortness of breath    . ARIPIPRAZOLE 2 MG PO TABS Oral Take 2 mg by mouth daily.      [name] VITAMIN C PO Oral Take by mouth daily.    . ASPIRIN 81 MG PO TABS Oral Take 81 mg by mouth daily.      . DEXLANSOPRAZOLE 60 MG PO CPDR  Take one tab daily before breakfast 60 capsule 10  . ERYTHROMYCIN BASE 250  MG PO TABS Oral Take 1 tablet (250 mg total) by mouth 4 (four) times daily. 30 tablet 10  . FAMOTIDINE 20 MG PO TABS Oral Take 20 mg by mouth at bedtime.     . FENOFIBRATE 145 MG PO TABS  TAKE 1 TABLET BY MOUTH EVERY DAY 30 tablet 0  . FUROSEMIDE 40 MG PO TABS Oral Take 40 mg by mouth daily.    [name] LISINOPRIL 20 MG PO TABS  TAKE 1 TABLET BY MOUTH EVERY DAY 30 tablet 5  . MAGNESIUM OXIDE 400 MG PO TABS Oral Take 400 mg by mouth daily.    . MELOXICAM 7.5 MG PO TABS Oral Take 7.5 mg by mouth 2 (two) times daily.     [name] NAPROXEN 250 MG PO TABS Oral Take 250 mg by mouth 2 (two) times daily with a meal.     . NITROGLYCERIN 0.4 MG SL SUBL Sublingual Place 0.4 mg under the tongue every 5 (five) minutes as needed. For chest pain    . PAROXETINE HCL ER 25 MG PO TB24 Oral Take 50 mg by mouth at bedtime.     [name] PINDOLOL 5 MG PO TABS Oral Take 2.5-5 mg by  mouth 2 (two) times daily. Take 1 tablet every morning and take 1/2 tablet every evening    . POLYETHYL GLYCOL-PROPYL GLYCOL 0.4-0.3 % OP GEL Ophthalmic Apply 1 drop to eye 2 (two) times daily.     [name] POLYETHYLENE GLYCOL 3350 PO PACK Oral Take 17 g by mouth daily as needed. For constipation    . PRAVASTATIN SODIUM 80 MG PO TABS Oral Take 1 tablet (80 mg total) by mouth daily. 30 tablet 11  . RIBOFLAVIN 100 MG PO CAPS Oral Take 2 capsules by mouth 2 (two) times daily.     [name] SOLIFENACIN SUCCINATE 5 MG PO TABS Oral Take 5 mg by mouth daily.      [name] TIZANIDINE HCL 4 MG PO TABS Oral Take 2 mg by mouth every 8 (eight) hours as needed. For pain    . TRAMADOL HCL 50 MG PO TABS Oral Take 50 mg by mouth every 6 (six) hours as needed.      BP 129/75  Pulse 80  Temp 98.4 F (36.9 C) (Oral)  Resp 16  SpO2 98%  Physical Exam  Nursing note and vitals reviewed. Constitutional: She is oriented to person, place, and time. She appears well-developed and well-nourished. No distress.       Anxious appearing  HENT:  Head: Normocephalic and atraumatic.  Mouth/Throat: Oropharynx is clear and moist.  Eyes: EOM are normal. Pupils are equal, round, and reactive to light.  Neck: Normal range of motion. Neck supple.  Cardiovascular: Normal rate and regular rhythm.   Pulmonary/Chest: Effort normal and breath sounds normal. No respiratory distress. She has no wheezes. She has no rales. She exhibits no tenderness.  Abdominal: Soft. Bowel sounds are normal. She exhibits no distension and no mass. There is no tenderness. There is no rebound and no guarding.  Musculoskeletal: Normal range of motion. She exhibits tenderness (TTP over thoracic paraspinal muscles and R thoracic back musculature). She exhibits no edema (No lower ext swelling or pain).  Neurological: She is alert and oriented to person, place, and time.       5/5 motor. Sensation intact  Skin: Skin is warm and dry. No rash noted. No erythema.    ED Course    Procedures (including critical care time)   Labs Reviewed  CBC  COMPREHENSIVE METABOLIC PANEL   Dg Chest 2 View  05/24/2012  *RADIOLOGY REPORT*  Clinical Data: Chest pain and shortness of breath.  CHEST - 2 VIEW  Comparison: 05/12/2010.  Findings: Trachea is midline.  Heart size normal.  Minimal scattered scarring in the mid lung zones.  Lungs are otherwise clear.  No pleural fluid.  IMPRESSION: No acute findings.   Original Report Authenticated By: Reyes Ivan, M.D.      No diagnosis found.   Date: 05/25/2012  Rate: 84  Rhythm: normal sinus rhythm  QRS Axis: normal  Intervals: normal  ST/T Wave abnormalities: nonspecific T wave changes  Conduction Disutrbances:nonspecific intraventricular conduction delay  Narrative Interpretation:   Old EKG Reviewed: unchanged    MDM   Unlikely CAD. Given reproduced pain with palp likely musculoskeletal. F/u with PMD and return for worsening symptoms.       Loren Racer, MD 05/25/12 0157

## 2012-05-25 NOTE — ED Notes (Addendum)
Pt sts "her nerves had been bad x2 days" pt also sts her pain feels like "the shingle commercial."

## 2012-05-25 NOTE — ED Notes (Signed)
Pt reports intermittent (R) side chest pain radiating through to her back, describes her pain as stabbing throbbing needles, numbness to mouth and fingers, pt reports pain has subsided. Pt denies SOB, N/V/D, diaphoresis, or dizziness.

## 2012-05-27 ENCOUNTER — Ambulatory Visit (AMBULATORY_SURGERY_CENTER): Payer: Medicare Other | Admitting: Gastroenterology

## 2012-05-27 ENCOUNTER — Encounter: Payer: Self-pay | Admitting: Gastroenterology

## 2012-05-27 VITALS — BP 139/69 | HR 85 | Temp 99.3°F | Resp 17 | Ht 65.0 in | Wt 192.0 lb

## 2012-05-27 DIAGNOSIS — K222 Esophageal obstruction: Secondary | ICD-10-CM

## 2012-05-27 DIAGNOSIS — B182 Chronic viral hepatitis C: Secondary | ICD-10-CM

## 2012-05-27 DIAGNOSIS — K3184 Gastroparesis: Secondary | ICD-10-CM

## 2012-05-27 DIAGNOSIS — R1310 Dysphagia, unspecified: Secondary | ICD-10-CM

## 2012-05-27 MED ORDER — SODIUM CHLORIDE 0.9 % IV SOLN: 500.0000 mL | INTRAVENOUS | Status: DC

## 2012-05-27 NOTE — Patient Instructions (Addendum)
YOU HAD AN ENDOSCOPIC PROCEDURE TODAY AT THE [facility]: Refer to the procedure report that was given to you for any specific questions about what was found during the examination.  If the procedure report does not answer your questions, please call your gastroenterologist to clarify.  If you requested that your care partner not be given the details of your procedure findings, then the procedure report has been included in a sealed envelope for you to review at your convenience later.  DIET:She can have water in 1 hour, and a soft diet for the rest of today.   ACTIVITY: Your care partner should take you home directly after the procedure.  You should plan to take it easy, moving slowly for the rest of the day.  You can resume normal activity the day after the procedure however you should NOT DRIVE or use heavy machinery for 24 hours (because of the sedation medicines used during the test).    SYMPTOMS TO REPORT IMMEDIATELY: A gastroenterologist can be reached at any hour.  During normal business hours, 8:30 AM to 5:00 PM Monday through Friday, call [phone].  After hours and on weekends, please call the GI answering service at [phone] who will take a message and have the physician on call contact you.   Following upper endoscopy (EGD)  Vomiting of blood or coffee ground material  New chest pain or pain under the shoulder blades  Painful or persistently difficult swallowing  New shortness of breath  Fever of 100F or higher  Black, tarry-looking stools  FOLLOW UP: If any biopsies were taken you will be contacted by phone or by letter within the next 1-3 weeks.  Call your gastroenterologist if you have not heard about the biopsies in 3 weeks.  Our staff will call the home number listed on your records the next business day following your procedure to check on you and address any questions or concerns that you may have at that time regarding the information given to you  following your procedure. This is a courtesy call and so if there is no answer at the home number and we have not heard from you through the emergency physician on call, we will assume that you have returned to your regular daily activities without incident.  SIGNATURES/CONFIDENTIALITY: You and/or your care partner have signed paperwork which will be entered into your electronic medical record.  These signatures attest to the fact that that the information above on your After Visit Summary has been reviewed and is understood.  Full responsibility of the confidentiality of this discharge information lies with you and/or your care-partner.

## 2012-05-27 NOTE — Op Note (Signed)
[facility] [address] [place], [address]   ENDOSCOPY PROCEDURE REPORT  PATIENT: [name], [name]  MR#: [id number] BIRTHDATE: May 25, 1951 , 61  yrs. old GENDER: Female ENDOSCOPIST: Louis Meckel, MD REFERRED BY:  Elizabeth Palau, F.N.P.-B.C. PROCEDURE DATE:  05/27/2012 PROCEDURE:  EGD, diagnostic and Maloney dilation of esophagus ASA CLASS:     Class II INDICATIONS:  dysphagia. MEDICATIONS: MAC sedation, administered by CRNA, propofol (Diprivan) 200mg  IV, and Simethicone 0.6cc PO TOPICAL ANESTHETIC: Cetacaine Spray  DESCRIPTION OF PROCEDURE: After the risks benefits and alternatives of the procedure were thoroughly explained, informed consent was obtained.  [facility] GIF-H180 [id number] endoscope was introduced through the mouth and advanced to the second portion of the duodenum. Without limitations.  The instrument was slowly withdrawn as the mucosa was fully examined.      ESOPHAGUS: A stricture was found.   There was a moderate stricture. a [address] [place] [address]. was passed with mild resistance. No heme was seen.  The remainder of the upper endoscopy exam was otherwise normal. Retroflexed views revealed no abnormalities.     The scope was then withdrawn from the patient and the procedure completed.  COMPLICATIONS: There were no complications. ENDOSCOPIC IMPRESSION: 1.   Stricture -  status post 2.   The remainder of the upper endoscopy exam was otherwise normal  RECOMMENDATIONS: repeat dilation as needed  REPEAT EXAM:  eSigned:  Louis Meckel, MD 05/27/2012 8:59 AM   CC:

## 2012-05-27 NOTE — Progress Notes (Addendum)
Patient did not have preoperative order for IV antibiotic SSI prophylaxis. (G8918)  Patient did not experience any of the following events: a burn prior to discharge; a fall within the facility; wrong site/side/patient/procedure/implant event; or a hospital transfer or hospital admission upon discharge from the facility. (G8907)  

## 2012-05-28 ENCOUNTER — Telehealth: Payer: Self-pay

## 2012-05-28 NOTE — Telephone Encounter (Signed)
Left message on answering machine. 

## 2012-06-03 ENCOUNTER — Other Ambulatory Visit: Payer: Self-pay | Admitting: Gastroenterology

## 2012-06-04 ENCOUNTER — Telehealth: Payer: Self-pay | Admitting: Internal Medicine

## 2012-06-04 NOTE — Telephone Encounter (Signed)
New problem:  [facility] waiting on form that was fax on 10/4. Cardiac clearance

## 2012-06-04 NOTE — Telephone Encounter (Signed)
Patient called because she needs to be clear for left knee surgery with [facility] orth[facility]. Patient had a Lexiscan done on 05/10/12 in the office which was normal. Patient states needs to be clear for surgery prior scheduled procedure. According to pt, [facility] is waiting for a form to be faxed back to them, this  form was faxed to this office on 05/31/12.

## 2012-06-05 ENCOUNTER — Other Ambulatory Visit: Payer: Self-pay | Admitting: Anesthesiology

## 2012-06-05 DIAGNOSIS — M545 Low back pain, unspecified: Secondary | ICD-10-CM

## 2012-06-05 NOTE — Telephone Encounter (Signed)
F/u  Fax # [phone].   Status of medical clearance.

## 2012-06-05 NOTE — Telephone Encounter (Signed)
Info forwarded to Dr. Tenny Craw.  Dr. Tenny Craw will be in the office tomorrow.

## 2012-06-07 NOTE — Telephone Encounter (Signed)
Will forward to Dr Tenny Craw to find out if paperwork is done.  Notified pt that we will get it to her as soon as possible.

## 2012-06-07 NOTE — Telephone Encounter (Signed)
F/u   Pt calling to f/u on paperwork from [facility] [phone]

## 2012-06-08 ENCOUNTER — Ambulatory Visit
Admission: RE | Admit: 2012-06-08 | Discharge: 2012-06-08 | Disposition: A | Discharge status: Home / Self Care | Payer: Medicare Other | Source: Ambulatory Visit | Attending: Anesthesiology | Admitting: Anesthesiology

## 2012-06-08 DIAGNOSIS — M545 Low back pain, unspecified: Secondary | ICD-10-CM

## 2012-06-10 ENCOUNTER — Telehealth: Payer: Self-pay | Admitting: *Deleted

## 2012-06-10 ENCOUNTER — Encounter: Payer: Self-pay | Admitting: Internal Medicine

## 2012-06-10 NOTE — Telephone Encounter (Signed)
Notified pt surgical clearance sent to dr Myra Rude

## 2012-06-10 NOTE — Telephone Encounter (Signed)
Follow-up: ° ° ° °Patient called in wanting to know about her paperwork.  Please call back. °

## 2012-06-11 ENCOUNTER — Encounter (HOSPITAL_COMMUNITY): Payer: Self-pay | Admitting: Pharmacy Technician

## 2012-06-11 NOTE — Telephone Encounter (Signed)
I dictated letter to ortho.

## 2012-06-11 NOTE — Telephone Encounter (Signed)
Pt was notified and letter was faxed to [facility].

## 2012-06-13 ENCOUNTER — Encounter (HOSPITAL_COMMUNITY)
Admission: RE | Admit: 2012-06-13 | Discharge: 2012-06-13 | Disposition: A | Discharge status: Home / Self Care | Payer: Medicare Other | Source: Ambulatory Visit | Attending: Orthopedic Surgery | Admitting: Orthopedic Surgery

## 2012-06-13 ENCOUNTER — Encounter (HOSPITAL_COMMUNITY): Payer: Self-pay

## 2012-06-13 DIAGNOSIS — I509 Heart failure, unspecified: Secondary | ICD-10-CM | POA: Insufficient documentation

## 2012-06-13 HISTORY — DX: Nausea with vomiting, unspecified: R11.2

## 2012-06-13 HISTORY — DX: Heart failure, unspecified: I50.9

## 2012-06-13 HISTORY — DX: Other specified postprocedural states: Z98.890

## 2012-06-13 LAB — COMPREHENSIVE METABOLIC PANEL
Albumin: 3.9 g/dL (ref 3.5–5.2)
Alkaline Phosphatase: 46 U/L (ref 39–117)
BUN: 24 mg/dL — ABNORMAL HIGH (ref 6–23)
Calcium: 9.5 mg/dL (ref 8.4–10.5)
Creatinine, Ser: 0.99 mg/dL (ref 0.50–1.10)
GFR calc Af Amer: 70 mL/min — ABNORMAL LOW (ref >90)
Glucose, Bld: 263 mg/dL — ABNORMAL HIGH (ref 70–99)
Potassium: 4 mEq/L (ref 3.5–5.1)
Total Protein: 7.4 g/dL (ref 6.0–8.3)

## 2012-06-13 LAB — CBC
HCT: 37.2 % (ref 36.0–46.0)
Hemoglobin: 12.5 g/dL (ref 12.0–15.0)
MCH: 30.2 pg (ref 26.0–34.0)
MCHC: 33.6 g/dL (ref 30.0–36.0)
MCV: 89.9 fL (ref 78.0–100.0)
RDW: 13.6 % (ref 11.5–15.5)

## 2012-06-13 LAB — SURGICAL PCR SCREEN: Staphylococcus aureus: NEGATIVE

## 2012-06-13 NOTE — Pre-Procedure Instructions (Addendum)
06-13-12 EKG/CXR -[id number]). Clearance note with chart-Dr. Demetrius Charity. Ross,cardiology.

## 2012-06-13 NOTE — Patient Instructions (Addendum)
20 [name] [name]  06/13/2012   Your procedure is scheduled on:   06-14-2012  Report to [facility] at    0530    AM.  Call this number if you have problems the morning of surgery: [phone]  Or Presurgical Testing [phone]([name] [name])   Remember: Bring Cpap mask and tubing.   Do not eat food:After Midnight.    Take these medicines the morning of surgery with A SIP OF WATER: Use and bring Albuterol(Proventil) inhaler, eye drops. Take Abilify. Dexilant. Tramadol. Pindolol. Gabapentin.   Do not wear jewelry, make-up or nail polish.  Do not wear lotions, powders, or perfumes. You may wear deodorant.  Do not shave 48 hours prior to surgery.(face and neck okay, no shaving of legs)  Do not bring valuables to the hospital.  Contacts, dentures or bridgework may not be worn into surgery.  Leave suitcase in the car. After surgery it may be brought to your room.  For patients admitted to the hospital, checkout time is 11:00 AM the day of discharge.   Patients discharged the day of surgery will not be allowed to drive home. Must have responsible person with you x 24 hours once discharged.  Name and phone number of your driver: [name] [name] [phone] cell  Special Instructions: CHG Shower Use Special Wash: see special instruction sheet.(avoid face and genitals)   Please read over the following fact sheets that you were given: MRSA Information.

## 2012-06-13 NOTE — Progress Notes (Signed)
06-13-12 labs viewable in epic.Pt i s known NIDDM, results not fasting. Will do CBg on arrival.W. Michaela Shankel,RN

## 2012-06-14 ENCOUNTER — Encounter (HOSPITAL_COMMUNITY)
Admission: RE | Disposition: A | Discharge status: Home / Self Care | Payer: Self-pay | Source: Ambulatory Visit | Attending: Orthopedic Surgery

## 2012-06-14 ENCOUNTER — Encounter (HOSPITAL_COMMUNITY): Payer: Self-pay | Admitting: Certified Registered Nurse Anesthetist

## 2012-06-14 ENCOUNTER — Ambulatory Visit (HOSPITAL_COMMUNITY): Payer: Medicare Other | Admitting: Certified Registered Nurse Anesthetist

## 2012-06-14 ENCOUNTER — Encounter (HOSPITAL_COMMUNITY): Payer: Self-pay | Admitting: *Deleted

## 2012-06-14 ENCOUNTER — Ambulatory Visit (HOSPITAL_COMMUNITY)
Admission: RE | Admit: 2012-06-14 | Discharge: 2012-06-14 | Disposition: A | Discharge status: Home / Self Care | Payer: Medicare Other | Source: Ambulatory Visit | Attending: Orthopedic Surgery | Admitting: Orthopedic Surgery

## 2012-06-14 DIAGNOSIS — K219 Gastro-esophageal reflux disease without esophagitis: Secondary | ICD-10-CM | POA: Insufficient documentation

## 2012-06-14 DIAGNOSIS — E119 Type 2 diabetes mellitus without complications: Secondary | ICD-10-CM | POA: Insufficient documentation

## 2012-06-14 DIAGNOSIS — E785 Hyperlipidemia, unspecified: Secondary | ICD-10-CM | POA: Insufficient documentation

## 2012-06-14 DIAGNOSIS — Z01812 Encounter for preprocedural laboratory examination: Secondary | ICD-10-CM | POA: Insufficient documentation

## 2012-06-14 DIAGNOSIS — I2510 Atherosclerotic heart disease of native coronary artery without angina pectoris: Secondary | ICD-10-CM | POA: Insufficient documentation

## 2012-06-14 DIAGNOSIS — G4730 Sleep apnea, unspecified: Secondary | ICD-10-CM | POA: Insufficient documentation

## 2012-06-14 DIAGNOSIS — M1712 Unilateral primary osteoarthritis, left knee: Secondary | ICD-10-CM

## 2012-06-14 DIAGNOSIS — Z79899 Other long term (current) drug therapy: Secondary | ICD-10-CM | POA: Insufficient documentation

## 2012-06-14 DIAGNOSIS — I10 Essential (primary) hypertension: Secondary | ICD-10-CM | POA: Insufficient documentation

## 2012-06-14 DIAGNOSIS — M1710 Unilateral primary osteoarthritis, unspecified knee: Secondary | ICD-10-CM | POA: Insufficient documentation

## 2012-06-14 DIAGNOSIS — M23305 Other meniscus derangements, unspecified medial meniscus, unspecified knee: Secondary | ICD-10-CM | POA: Insufficient documentation

## 2012-06-14 DIAGNOSIS — B182 Chronic viral hepatitis C: Secondary | ICD-10-CM | POA: Insufficient documentation

## 2012-06-14 LAB — GLUCOSE, CAPILLARY
Glucose-Capillary: 197 mg/dL — ABNORMAL HIGH (ref 70–99)
Glucose-Capillary: 177 mg/dL — ABNORMAL HIGH (ref 70–99)

## 2012-06-14 SURGERY — ARTHROSCOPY, KNEE, WITH MEDIAL MENISCECTOMY
Anesthesia: General | Site: Knee | Laterality: Left | Wound class: Clean

## 2012-06-14 MED ORDER — MIDAZOLAM HCL 5 MG/5ML IJ SOLN
INTRAMUSCULAR | Status: DC | PRN
  Administered 2012-06-14: 2 mg via INTRAVENOUS

## 2012-06-14 MED ORDER — ALBUTEROL SULFATE (5 MG/ML) 0.5% IN NEBU
2.5000 mg | INHALATION_SOLUTION | Freq: Once | RESPIRATORY_TRACT | Status: AC
  Administered 2012-06-14: 2.5 mg via RESPIRATORY_TRACT

## 2012-06-14 MED ORDER — PINDOLOL 5 MG PO TABS
5.0000 mg | ORAL_TABLET | Freq: Two times a day (BID) | ORAL | Status: DC
  Administered 2012-06-14: 5 mg via ORAL
  Filled 2012-06-14: qty 1

## 2012-06-14 MED ORDER — PROMETHAZINE HCL 25 MG/ML IJ SOLN: 6.2500 mg | INTRAMUSCULAR | Status: DC | PRN

## 2012-06-14 MED ORDER — OXYCODONE-ACETAMINOPHEN 5-325 MG PO TABS
1.0000 | ORAL_TABLET | Freq: Once | ORAL | Status: AC
  Administered 2012-06-14: 1 via ORAL
  Filled 2012-06-14: qty 1

## 2012-06-14 MED ORDER — CEFAZOLIN SODIUM-DEXTROSE 2-3 GM-% IV SOLR
INTRAVENOUS | Status: AC
  Filled 2012-06-14: qty 50

## 2012-06-14 MED ORDER — LACTATED RINGERS IR SOLN
Status: DC | PRN
  Administered 2012-06-14 (×2): 3000 mL

## 2012-06-14 MED ORDER — BUPIVACAINE-EPINEPHRINE PF 0.25-1:200000 % IJ SOLN
INTRAMUSCULAR | Status: AC
  Filled 2012-06-14: qty 30

## 2012-06-14 MED ORDER — SCOPOLAMINE 1 MG/3DAYS TD PT72
MEDICATED_PATCH | TRANSDERMAL | Status: DC | PRN
  Administered 2012-06-14: 1 via TRANSDERMAL

## 2012-06-14 MED ORDER — OXYCODONE HCL 5 MG PO TABS
5.0000 mg | ORAL_TABLET | Freq: Once | ORAL | Status: AC
  Administered 2012-06-14: 5 mg via ORAL
  Filled 2012-06-14: qty 1

## 2012-06-14 MED ORDER — HYDROMORPHONE HCL PF 1 MG/ML IJ SOLN: 0.2500 mg | INTRAMUSCULAR | Status: DC | PRN

## 2012-06-14 MED ORDER — SCOPOLAMINE 1 MG/3DAYS TD PT72
MEDICATED_PATCH | TRANSDERMAL | Status: AC
  Filled 2012-06-14: qty 1

## 2012-06-14 MED ORDER — LACTATED RINGERS IV SOLN: INTRAVENOUS | Status: DC

## 2012-06-14 MED ORDER — BACITRACIN ZINC 500 UNIT/GM EX OINT
TOPICAL_OINTMENT | CUTANEOUS | Status: DC | PRN
  Administered 2012-06-14: 1 via TOPICAL

## 2012-06-14 MED ORDER — BUPIVACAINE-EPINEPHRINE 0.25% -1:200000 IJ SOLN
INTRAMUSCULAR | Status: DC | PRN
  Administered 2012-06-14: 30 mL

## 2012-06-14 MED ORDER — PROPOFOL 10 MG/ML IV BOLUS
INTRAVENOUS | Status: DC | PRN
  Administered 2012-06-14: 100 mg via INTRAVENOUS
  Administered 2012-06-14: 50 mg via INTRAVENOUS

## 2012-06-14 MED ORDER — ALBUTEROL SULFATE (5 MG/ML) 0.5% IN NEBU
INHALATION_SOLUTION | RESPIRATORY_TRACT | Status: AC
  Filled 2012-06-14: qty 0.5

## 2012-06-14 MED ORDER — FENTANYL CITRATE 0.05 MG/ML IJ SOLN
INTRAMUSCULAR | Status: DC | PRN
  Administered 2012-06-14: 50 ug via INTRAVENOUS
  Administered 2012-06-14 (×2): 100 ug via INTRAVENOUS

## 2012-06-14 MED ORDER — OXYCODONE-ACETAMINOPHEN 10-325 MG PO TABS: 1.0000 | ORAL_TABLET | ORAL | Status: DC | PRN

## 2012-06-14 MED ORDER — DEXAMETHASONE SODIUM PHOSPHATE 10 MG/ML IJ SOLN
INTRAMUSCULAR | Status: DC | PRN
  Administered 2012-06-14: 10 mg via INTRAVENOUS

## 2012-06-14 MED ORDER — CHLORHEXIDINE GLUCONATE 4 % EX LIQD
60.0000 mL | Freq: Once | CUTANEOUS | Status: DC
  Filled 2012-06-14: qty 60

## 2012-06-14 MED ORDER — CEFAZOLIN SODIUM-DEXTROSE 2-3 GM-% IV SOLR
2.0000 g | INTRAVENOUS | Status: AC
  Administered 2012-06-14: 2 g via INTRAVENOUS

## 2012-06-14 MED ORDER — LACTATED RINGERS IV SOLN
INTRAVENOUS | Status: DC
  Administered 2012-06-14 (×2): via INTRAVENOUS

## 2012-06-14 MED ORDER — ONDANSETRON HCL 4 MG/2ML IJ SOLN
INTRAMUSCULAR | Status: DC | PRN
  Administered 2012-06-14: 4 mg via INTRAVENOUS

## 2012-06-14 MED ORDER — BACITRACIN ZINC 500 UNIT/GM EX OINT
TOPICAL_OINTMENT | CUTANEOUS | Status: AC
  Filled 2012-06-14: qty 15

## 2012-06-14 SURGICAL SUPPLY — 31 items
BANDAGE ELASTIC 4 VELCRO ST LF (GAUZE/BANDAGES/DRESSINGS) ×2 IMPLANT
BLADE GREAT WHITE 4.2 (BLADE) ×2 IMPLANT
BNDG COHESIVE 6X5 TAN STRL LF (GAUZE/BANDAGES/DRESSINGS) ×2 IMPLANT
CLOTH BEACON ORANGE TIMEOUT ST (SAFETY) ×2 IMPLANT
DRAPE LG THREE QUARTER DISP (DRAPES) ×2 IMPLANT
DRSG EMULSION OIL 3X3 NADH (GAUZE/BANDAGES/DRESSINGS) ×2 IMPLANT
DRSG PAD ABDOMINAL 8X10 ST (GAUZE/BANDAGES/DRESSINGS) ×6 IMPLANT
DURAPREP 26ML APPLICATOR (WOUND CARE) ×2 IMPLANT
GLOVE BIOGEL PI IND STRL 8 (GLOVE) IMPLANT
GLOVE BIOGEL PI IND STRL 8.5 (GLOVE) ×1 IMPLANT
GLOVE BIOGEL PI INDICATOR 8 (GLOVE)
GLOVE BIOGEL PI INDICATOR 8.5 (GLOVE) ×1
GLOVE ECLIPSE 8.0 STRL XLNG CF (GLOVE) ×2 IMPLANT
GLOVE SURG SS PI 7.5 STRL IVOR (GLOVE) ×4 IMPLANT
GOWN PREVENTION PLUS LG XLONG (DISPOSABLE) IMPLANT
GOWN PREVENTION PLUS XLARGE (GOWN DISPOSABLE) IMPLANT
GOWN STRL NON-REIN LRG LVL3 (GOWN DISPOSABLE) ×2 IMPLANT
GOWN STRL REIN XL XLG (GOWN DISPOSABLE) ×4 IMPLANT
MANIFOLD NEPTUNE II (INSTRUMENTS) ×2 IMPLANT
PACK ARTHROSCOPY WL (CUSTOM PROCEDURE TRAY) ×2 IMPLANT
PACK ICE MAXI GEL EZY WRAP (MISCELLANEOUS) IMPLANT
PAD MASON LEG HOLDER (PIN) ×2 IMPLANT
SET ARTHROSCOPY TUBING (MISCELLANEOUS) ×1
SET ARTHROSCOPY TUBING LN (MISCELLANEOUS) ×1 IMPLANT
SUT ETHILON 3 0 PS 1 (SUTURE) ×2 IMPLANT
SYR 20CC LL (SYRINGE) ×2 IMPLANT
TOWEL OR 17X26 10 PK STRL BLUE (TOWEL DISPOSABLE) ×2 IMPLANT
TOWEL OR NON WOVEN STRL DISP B (DISPOSABLE) ×2 IMPLANT
TUBING CONNECTING 10 (TUBING) IMPLANT
WAND 90 DEG TURBOVAC W/CORD (SURGICAL WAND) ×2 IMPLANT
WRAP KNEE MAXI GEL POST OP (GAUZE/BANDAGES/DRESSINGS) IMPLANT
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
## 2012-06-14 NOTE — Progress Notes (Signed)
Dr. Rica Mast states pt may have Percocet 10/325 as ordered by  Dr. Darrelyn Hillock for pain keep her 30 min and then  May go home. Pt was instructed and written instructions given to wear CPAP today tonight and as long as she is taking her narcotic and to remove Scop patch from behind ear in am. She has a walker at home

## 2012-06-14 NOTE — Anesthesia Postprocedure Evaluation (Signed)
  Anesthesia Post Note  Patient: [name] [name]  Procedure(s) Performed: Procedure(s) (LRB): KNEE ARTHROSCOPY WITH MEDIAL MENISECTOMY (Left)  Anesthesia type: General  Patient location: PACU  Post pain: Pain level controlled  Post assessment: Post-op Vital signs reviewed  Last Vitals:  Filed Vitals:   06/14/12 1038  BP:   Pulse: 92  Temp:   Resp: 21    Post vital signs: Reviewed  Level of consciousness: sedated  Complications: No apparent anesthesia complications

## 2012-06-14 NOTE — Progress Notes (Signed)
Patient dozing-  Respirations regular and unlabored- Sa02 95 - on Nasal O2 at 3 liters per minute.

## 2012-06-14 NOTE — Progress Notes (Signed)
O2 sat 95. Pt looks good and wanting to go home. She has a walker for home use. Ice wrap sent home with pt

## 2012-06-14 NOTE — Anesthesia Procedure Notes (Addendum)
Performed by: Hulan Fess   Procedure Name: LMA Insertion Date/Time: 06/14/2012 7:26 AM Performed by: Hulan Fess Pre-anesthesia Checklist: Patient identified, Emergency Drugs available, Suction available and Timeout performed Patient Re-evaluated:Patient Re-evaluated prior to inductionOxygen Delivery Method: Circle system utilized Preoxygenation: Pre-oxygenation with 100% oxygen Intubation Type: IV induction Ventilation: Mask ventilation without difficulty LMA: LMA inserted and LMA with gastric port inserted LMA Size: 3.0

## 2012-06-14 NOTE — Progress Notes (Signed)
Patient states she feels like she is breathing better now.

## 2012-06-14 NOTE — Progress Notes (Signed)
Dr. Rica Mast in- examined patient- O.K. To go to Short Stay.

## 2012-06-14 NOTE — Anesthesia Preprocedure Evaluation (Signed)
Anesthesia Evaluation  Patient identified by MRN, date of birth, ID band Patient awake    Reviewed: Allergy & Precautions, H&P , NPO status , Patient's Chart, lab work & pertinent test results  History of Anesthesia Complications (+) PONV  Airway Mallampati: II TM Distance: >3 FB Neck ROM: Full    Dental  (+) Partial Upper, Dental Advisory Given and Poor Dentition   Pulmonary shortness of breath and with exertion, sleep apnea and Continuous Positive Airway Pressure Ventilation ,  breath sounds clear to auscultation        Cardiovascular hypertension, + CAD (Minimal CAD per cath report) and +CHF Rhythm:Regular Rate:Normal     Neuro/Psych Depression negative neurological ROS     GI/Hepatic GERD-  ,(+) Hepatitis -, C  Endo/Other  diabetes (diet control)  Renal/GU negative Renal ROS  negative genitourinary   Musculoskeletal negative musculoskeletal ROS (+)   Abdominal   Peds negative pediatric ROS (+)  Hematology negative hematology ROS (+)   Anesthesia Other Findings   Reproductive/Obstetrics negative OB ROS                           Anesthesia Physical Anesthesia Plan  ASA: III  Anesthesia Plan: General   Post-op Pain Management:    Induction: Intravenous  Airway Management Planned: LMA  Additional Equipment:   Intra-op Plan:   Post-operative Plan: Extubation in OR  Informed Consent: I have reviewed the patients History and Physical, chart, labs and discussed the procedure including the risks, benefits and alternatives for the proposed anesthesia with the patient or authorized representative who has indicated his/her understanding and acceptance.   Dental advisory given  Plan Discussed with: CRNA  Anesthesia Plan Comments:         Anesthesia Quick Evaluation

## 2012-06-14 NOTE — Transfer of Care (Signed)
Immediate Anesthesia Transfer of Care Note  Patient: [name] [name]  Procedure(s) Performed: Procedure(s) (LRB) with comments: KNEE ARTHROSCOPY WITH MEDIAL MENISECTOMY (Left)  Patient Location: PACU  Anesthesia Type: General  Level of Consciousness: sedated  Airway & Oxygen Therapy: Patient Spontanous Breathing and Patient connected to face mask oxygen  Post-op Assessment: Report given to PACU RN  Post vital signs: Reviewed and stable  Complications: No apparent anesthesia complications

## 2012-06-14 NOTE — Progress Notes (Signed)
Patient states she does not feel like she is breathing well- SA02 99- skin warm and dry- color pink- respirations regular and unlabored- Dr. Rica Mast notified- Albuterol [2.5MG .} Nebulizer Treatment given. Patient tolerated it well.

## 2012-06-14 NOTE — Progress Notes (Signed)
O2 off

## 2012-06-14 NOTE — Interval H&P Note (Signed)
History and Physical Interval Note:  06/14/2012 7:09 AM  [name] [name]  has presented today for surgery, with the diagnosis of left knee torn medial meniscus  The various methods of treatment have been discussed with the patient and family. After consideration of risks, benefits and other options for treatment, the patient has consented to  Procedure(s) (LRB) with comments: KNEE ARTHROSCOPY WITH MEDIAL MENISECTOMY (Left) as a surgical intervention .  The patient's history has been reviewed, patient examined, no change in status, stable for surgery.  I have reviewed the patient's chart and labs.  Questions were answered to the patient's satisfaction.     Irma Roulhac A

## 2012-06-14 NOTE — Brief Op Note (Signed)
06/14/2012  8:13 AM  PATIENT:  [name] [name]  61 y.o. female  PRE-OPERATIVE DIAGNOSIS:  left knee torn medial meniscus and osteoarthritis  POST-OPERATIVE DIAGNOSIS:  left knee torn medial meniscus and osteoarthritis PROCEDURE:  Procedure(s) (LRB) with comments: KNEE ARTHROSCOPY WITH MEDIAL MENISECTOMY (Left),Abraision Chondroplasties of Medial Femoral Condyle and Patella:Microfractures of Medial Femoral Condyle,and Synovectomy of Supra Patellar Pouch.  SURGEON:  Surgeon(s) and Role:    * Jacki Cones, MD - Primary    ASSISTANTS: OR Tech  ANESTHESIA:   general  EBL:  Total I/O In: 1000 [I.V.:1000] Out: -   BLOOD ADMINISTERED:none  DRAINS: none   LOCAL MEDICATIONS USED:  MARCAINE 30cc of 0.25% Marcaine with Epinephrine.    SPECIMEN:  No Specimen  DISPOSITION OF SPECIMEN:  N/A  COUNTS:  YES  TOURNIQUET:  * No tourniquets in log *  DICTATION: .Other Dictation: Dictation Number [id number]  PLAN OF CARE: Discharge to home after PACU  PATIENT DISPOSITION:  PACU - hemodynamically stable.   Delay start of Pharmacological VTE agent (>24hrs) due to surgical blood loss or risk of bleeding: yes

## 2012-06-15 NOTE — Op Note (Signed)
[name] [name], [name] [name]                ACCOUNT NO.:  [id number]  MEDICAL RECORD NO.:  [id number]  LOCATION:  [facility]                         FACILITY:  [facility]  PHYSICIAN:  Georges Lynch. Christion Leonhard, M.D.DATE OF BIRTH:  06-29-1951  DATE OF PROCEDURE:  06/14/2012 DATE OF DISCHARGE:  06/14/2012                              OPERATIVE REPORT   SURGEON:  Windy Fast A. Vasiliy Mccarry, M.D.  ASSISTANT:  The OR tec.  PREOPERATIVE DIAGNOSES: 1. Degenerative arthritis, left knee. 2. Torn medial meniscus, left knee.  POSTOPERATIVE DIAGNOSES: 1. Degenerative arthritis, left knee. 2. Torn medial meniscus, left knee.  OPERATION: 1. Diagnostic arthroscopy, left knee. 2. Abrasion chondroplasty of medial femoral condyle, left knee. 3. Abrasion chondroplasty, patella, left knee. 4. Synovectomy, suprapatellar pouch, left knee. 5. Microfracture technique of the medial femoral condyle, left knee. 6. Partial medial meniscectomy, posterior horn, left knee.  PROCEDURE:  Under general anesthesia, routine orthopedic prep and draping of the left lower extremity was carried out.  She had 2 g of IV Ancef.  At this time, the appropriate time-out was carried out prior to surgery.  I marked the appropriate left leg in the holding area.  A small punctate incision was made in suprapatellar pouch.  Inflow cannula was inserted, knee was distended with saline.  Another small punctate incision was made in the anterolateral joint.  The arthroscope was entered from lateral approach and a complete diagnostic arthroscopy was carried out.  I went up in the suprapatellar pouch.  She had significant chondromalacia of the patella.  I utilized the abrasion chondroplasty technique utilizing a shaver suction device.  Also at this time, I did a synovectomy of suprapatellar pouch utilizing the ArthroCare.  I went down in the lateral joint.  The lateral joint looked fine.  There was no meniscectomy necessary.  Cruciates were intact and over the  medial joint, did an abrasion chondroplasty of the medial femoral condyle and then a microfracture technique of the medial femoral condyle in the usual manner.  Following that, I went back posteriorly and did a partial medial meniscectomy.  I thoroughly irrigated out the knee. There was no other pathology noted.  All the fluid was removed.  I closed all 3 punctate incisions with 3-0 nylon suture.  I injected 30 mL of 0.25% Marcaine with epinephrine in the knee joint.  A sterile Neosporin and Bunnell dressing was applied.          ______________________________ Georges Lynch Darrelyn Hillock, M.D.     RAG/MEDQ  D:  06/14/2012  T:  06/15/2012  Job:  [id number]  cc:   Barbette Hair. Arlyce Dice, MD,FACG [address] [place] [address]

## 2012-06-20 ENCOUNTER — Other Ambulatory Visit: Payer: Self-pay | Admitting: Gastroenterology

## 2012-06-20 ENCOUNTER — Other Ambulatory Visit: Payer: Self-pay | Admitting: Internal Medicine

## 2012-06-21 ENCOUNTER — Ambulatory Visit
Admission: RE | Admit: 2012-06-21 | Discharge: 2012-06-21 | Disposition: A | Discharge status: Home / Self Care | Payer: Medicare Other | Source: Ambulatory Visit | Attending: Orthopedic Surgery | Admitting: Orthopedic Surgery

## 2012-06-21 ENCOUNTER — Encounter (HOSPITAL_COMMUNITY): Payer: Self-pay

## 2012-06-21 ENCOUNTER — Inpatient Hospital Stay (HOSPITAL_COMMUNITY)
Admission: EM | Admit: 2012-06-21 | Discharge: 2012-06-22 | DRG: 176 | Disposition: A | Discharge status: Home / Self Care | Payer: Medicare Other | Attending: Internal Medicine | Admitting: Internal Medicine

## 2012-06-21 ENCOUNTER — Other Ambulatory Visit: Payer: Self-pay | Admitting: Orthopedic Surgery

## 2012-06-21 DIAGNOSIS — R1310 Dysphagia, unspecified: Secondary | ICD-10-CM

## 2012-06-21 DIAGNOSIS — I2699 Other pulmonary embolism without acute cor pulmonale: Principal | ICD-10-CM

## 2012-06-21 DIAGNOSIS — R358 Other polyuria: Secondary | ICD-10-CM

## 2012-06-21 DIAGNOSIS — I10 Essential (primary) hypertension: Secondary | ICD-10-CM

## 2012-06-21 DIAGNOSIS — B182 Chronic viral hepatitis C: Secondary | ICD-10-CM

## 2012-06-21 DIAGNOSIS — K3184 Gastroparesis: Secondary | ICD-10-CM

## 2012-06-21 DIAGNOSIS — M1712 Unilateral primary osteoarthritis, left knee: Secondary | ICD-10-CM

## 2012-06-21 DIAGNOSIS — R0989 Other specified symptoms and signs involving the circulatory and respiratory systems: Secondary | ICD-10-CM

## 2012-06-21 DIAGNOSIS — R42 Dizziness and giddiness: Secondary | ICD-10-CM

## 2012-06-21 DIAGNOSIS — R52 Pain, unspecified: Secondary | ICD-10-CM

## 2012-06-21 DIAGNOSIS — R0602 Shortness of breath: Secondary | ICD-10-CM

## 2012-06-21 DIAGNOSIS — R002 Palpitations: Secondary | ICD-10-CM

## 2012-06-21 DIAGNOSIS — E7800 Pure hypercholesterolemia, unspecified: Secondary | ICD-10-CM

## 2012-06-21 DIAGNOSIS — S83249A Other tear of medial meniscus, current injury, unspecified knee, initial encounter: Secondary | ICD-10-CM

## 2012-06-21 DIAGNOSIS — E785 Hyperlipidemia, unspecified: Secondary | ICD-10-CM

## 2012-06-21 DIAGNOSIS — I6529 Occlusion and stenosis of unspecified carotid artery: Secondary | ICD-10-CM

## 2012-06-21 DIAGNOSIS — L02419 Cutaneous abscess of limb, unspecified: Secondary | ICD-10-CM | POA: Diagnosis present

## 2012-06-21 DIAGNOSIS — I2510 Atherosclerotic heart disease of native coronary artery without angina pectoris: Secondary | ICD-10-CM

## 2012-06-21 DIAGNOSIS — L03116 Cellulitis of left lower limb: Secondary | ICD-10-CM

## 2012-06-21 DIAGNOSIS — R197 Diarrhea, unspecified: Secondary | ICD-10-CM

## 2012-06-21 DIAGNOSIS — R5383 Other fatigue: Secondary | ICD-10-CM

## 2012-06-21 DIAGNOSIS — R079 Chest pain, unspecified: Secondary | ICD-10-CM

## 2012-06-21 DIAGNOSIS — K219 Gastro-esophageal reflux disease without esophagitis: Secondary | ICD-10-CM

## 2012-06-21 DIAGNOSIS — L03119 Cellulitis of unspecified part of limb: Secondary | ICD-10-CM | POA: Diagnosis present

## 2012-06-21 DIAGNOSIS — R0609 Other forms of dyspnea: Secondary | ICD-10-CM

## 2012-06-21 DIAGNOSIS — D131 Benign neoplasm of stomach: Secondary | ICD-10-CM

## 2012-06-21 DIAGNOSIS — R609 Edema, unspecified: Secondary | ICD-10-CM

## 2012-06-21 DIAGNOSIS — B370 Candidal stomatitis: Secondary | ICD-10-CM

## 2012-06-21 DIAGNOSIS — E119 Type 2 diabetes mellitus without complications: Secondary | ICD-10-CM

## 2012-06-21 DIAGNOSIS — E782 Mixed hyperlipidemia: Secondary | ICD-10-CM

## 2012-06-21 DIAGNOSIS — R7989 Other specified abnormal findings of blood chemistry: Secondary | ICD-10-CM

## 2012-06-21 DIAGNOSIS — R1011 Right upper quadrant pain: Secondary | ICD-10-CM

## 2012-06-21 DIAGNOSIS — R3589 Other polyuria: Secondary | ICD-10-CM

## 2012-06-21 LAB — BASIC METABOLIC PANEL
BUN: 21 mg/dL (ref 6–23)
Chloride: 101 mEq/L (ref 96–112)
GFR calc non Af Amer: 60 mL/min — ABNORMAL LOW (ref >90)
Glucose, Bld: 261 mg/dL — ABNORMAL HIGH (ref 70–99)
Potassium: 4.1 mEq/L (ref 3.5–5.1)

## 2012-06-21 LAB — PROTIME-INR
INR: 0.96 (ref 0.00–1.49)
Prothrombin Time: 12.7 seconds (ref 11.6–15.2)

## 2012-06-21 LAB — CBC WITH DIFFERENTIAL/PLATELET
Eosinophils Absolute: 0.1 10*3/uL (ref 0.0–0.7)
HCT: 30.8 % — ABNORMAL LOW (ref 36.0–46.0)
Hemoglobin: 10.4 g/dL — ABNORMAL LOW (ref 12.0–15.0)
Lymphs Abs: 1.5 10*3/uL (ref 0.7–4.0)
MCH: 31 pg (ref 26.0–34.0)
Monocytes Absolute: 0.3 10*3/uL (ref 0.1–1.0)
Monocytes Relative: 7 % (ref 3–12)
Neutrophils Relative %: 55 % (ref 43–77)
RBC: 3.36 MIL/uL — ABNORMAL LOW (ref 3.87–5.11)

## 2012-06-21 LAB — GLUCOSE, CAPILLARY: Glucose-Capillary: 283 mg/dL — ABNORMAL HIGH (ref 70–99)

## 2012-06-21 MED ORDER — POLYETHYLENE GLYCOL 3350 17 G PO PACK
17.0000 g | PACK | Freq: Every day | ORAL | Status: DC | PRN
  Filled 2012-06-21: qty 1

## 2012-06-21 MED ORDER — ALBUTEROL SULFATE (5 MG/ML) 0.5% IN NEBU: 2.5000 mg | INHALATION_SOLUTION | RESPIRATORY_TRACT | Status: DC | PRN

## 2012-06-21 MED ORDER — HYOSCYAMINE SULFATE 0.125 MG PO TABS
0.1250 mg | ORAL_TABLET | ORAL | Status: DC | PRN
  Filled 2012-06-21: qty 1

## 2012-06-21 MED ORDER — GABAPENTIN 300 MG PO CAPS
300.0000 mg | ORAL_CAPSULE | Freq: Two times a day (BID) | ORAL | Status: DC
Start: 2012-06-21 — End: 2012-06-22
  Administered 2012-06-21 – 2012-06-22 (×2): 300 mg via ORAL
  Filled 2012-06-21 (×3): qty 1

## 2012-06-21 MED ORDER — INSULIN ASPART 100 UNIT/ML ~~LOC~~ SOLN
0.0000 [IU] | Freq: Three times a day (TID) | SUBCUTANEOUS | Status: DC
  Administered 2012-06-22: 3 [IU] via SUBCUTANEOUS

## 2012-06-21 MED ORDER — PINDOLOL 5 MG PO TABS
5.0000 mg | ORAL_TABLET | Freq: Two times a day (BID) | ORAL | Status: DC
  Administered 2012-06-21 – 2012-06-22 (×2): 5 mg via ORAL
  Filled 2012-06-21 (×3): qty 1

## 2012-06-21 MED ORDER — FUROSEMIDE 40 MG PO TABS
40.0000 mg | ORAL_TABLET | Freq: Two times a day (BID) | ORAL | Status: DC
  Administered 2012-06-21 – 2012-06-22 (×2): 40 mg via ORAL
  Filled 2012-06-21 (×4): qty 1

## 2012-06-21 MED ORDER — SODIUM CHLORIDE 0.9 % IV SOLN
INTRAVENOUS | Status: DC
  Administered 2012-06-21: 17:00:00 via INTRAVENOUS

## 2012-06-21 MED ORDER — ENOXAPARIN SODIUM 100 MG/ML ~~LOC~~ SOLN
1.0000 mg/kg | Freq: Two times a day (BID) | SUBCUTANEOUS | Status: DC
  Administered 2012-06-22: 90 mg via SUBCUTANEOUS
  Filled 2012-06-21 (×3): qty 1

## 2012-06-21 MED ORDER — PAROXETINE HCL ER 25 MG PO TB24
50.0000 mg | ORAL_TABLET | Freq: Every day | ORAL | Status: DC
  Administered 2012-06-21: 50 mg via ORAL
  Filled 2012-06-21 (×2): qty 2

## 2012-06-21 MED ORDER — ACETAMINOPHEN 325 MG PO TABS: 650.0000 mg | ORAL_TABLET | Freq: Four times a day (QID) | ORAL | Status: DC | PRN

## 2012-06-21 MED ORDER — ALBUTEROL SULFATE HFA 108 (90 BASE) MCG/ACT IN AERS
2.0000 | INHALATION_SPRAY | Freq: Four times a day (QID) | RESPIRATORY_TRACT | Status: DC | PRN
  Filled 2012-06-21: qty 6.7

## 2012-06-21 MED ORDER — LISINOPRIL 20 MG PO TABS
20.0000 mg | ORAL_TABLET | Freq: Every day | ORAL | Status: DC
  Administered 2012-06-21: 20 mg via ORAL
  Filled 2012-06-21 (×2): qty 1

## 2012-06-21 MED ORDER — FAMOTIDINE 20 MG PO TABS
20.0000 mg | ORAL_TABLET | Freq: Every evening | ORAL | Status: DC | PRN
  Filled 2012-06-21: qty 1

## 2012-06-21 MED ORDER — OXYCODONE HCL 5 MG PO TABS
5.0000 mg | ORAL_TABLET | ORAL | Status: DC | PRN
  Administered 2012-06-21: 5 mg via ORAL
  Filled 2012-06-21: qty 1

## 2012-06-21 MED ORDER — OXYCODONE-ACETAMINOPHEN 5-325 MG PO TABS
1.0000 | ORAL_TABLET | ORAL | Status: DC | PRN
  Administered 2012-06-21: 1 via ORAL
  Filled 2012-06-21: qty 1

## 2012-06-21 MED ORDER — OXYCODONE-ACETAMINOPHEN 10-325 MG PO TABS: 1.0000 | ORAL_TABLET | ORAL | Status: DC | PRN

## 2012-06-21 MED ORDER — ARIPIPRAZOLE 2 MG PO TABS
2.0000 mg | ORAL_TABLET | Freq: Every day | ORAL | Status: DC
  Administered 2012-06-22: 2 mg via ORAL
  Filled 2012-06-21: qty 1

## 2012-06-21 MED ORDER — DARIFENACIN HYDROBROMIDE ER 15 MG PO TB24
15.0000 mg | ORAL_TABLET | Freq: Every day | ORAL | Status: DC
  Administered 2012-06-21 – 2012-06-22 (×2): 15 mg via ORAL
  Filled 2012-06-21 (×2): qty 1

## 2012-06-21 MED ORDER — SODIUM CHLORIDE 0.9 % IV SOLN: INTRAVENOUS | Status: DC

## 2012-06-21 MED ORDER — ONDANSETRON HCL 4 MG PO TABS: 4.0000 mg | ORAL_TABLET | Freq: Four times a day (QID) | ORAL | Status: DC | PRN

## 2012-06-21 MED ORDER — ACETAMINOPHEN 650 MG RE SUPP: 650.0000 mg | Freq: Four times a day (QID) | RECTAL | Status: DC | PRN

## 2012-06-21 MED ORDER — PNEUMOCOCCAL VAC POLYVALENT 25 MCG/0.5ML IJ INJ
0.5000 mL | INJECTION | INTRAMUSCULAR | Status: DC
  Filled 2012-06-21: qty 0.5

## 2012-06-21 MED ORDER — ONDANSETRON HCL 4 MG/2ML IJ SOLN: 4.0000 mg | Freq: Four times a day (QID) | INTRAMUSCULAR | Status: DC | PRN

## 2012-06-21 MED ORDER — SODIUM CHLORIDE 0.9 % IJ SOLN: 3.0000 mL | Freq: Two times a day (BID) | INTRAMUSCULAR | Status: DC

## 2012-06-21 MED ORDER — IOHEXOL 350 MG/ML SOLN
150.0000 mL | Freq: Once | INTRAVENOUS | Status: AC | PRN
  Administered 2012-06-21: 150 mL via INTRAVENOUS

## 2012-06-21 MED ORDER — PANTOPRAZOLE SODIUM 40 MG PO TBEC
40.0000 mg | DELAYED_RELEASE_TABLET | Freq: Every day | ORAL | Status: DC
  Administered 2012-06-21 – 2012-06-22 (×2): 40 mg via ORAL
  Filled 2012-06-21 (×2): qty 1

## 2012-06-21 MED ORDER — SIMVASTATIN 40 MG PO TABS
40.0000 mg | ORAL_TABLET | Freq: Every day | ORAL | Status: DC
  Filled 2012-06-21: qty 1

## 2012-06-21 MED ORDER — CEPHALEXIN 500 MG PO CAPS
500.0000 mg | ORAL_CAPSULE | Freq: Four times a day (QID) | ORAL | Status: DC
  Administered 2012-06-21 – 2012-06-22 (×4): 500 mg via ORAL
  Filled 2012-06-21 (×7): qty 1

## 2012-06-21 MED ORDER — ENOXAPARIN SODIUM 100 MG/ML ~~LOC~~ SOLN
1.0000 mg/kg | SUBCUTANEOUS | Status: AC
  Administered 2012-06-21: 90 mg via SUBCUTANEOUS
  Filled 2012-06-21: qty 1

## 2012-06-21 NOTE — ED Provider Notes (Signed)
History     CSN: [id number]  Arrival date & time 06/21/12  1543   First MD Initiated Contact with Patient 06/21/12 1551      Chief Complaint  Patient presents with  . Shortness of Breath    (Consider location/radiation/quality/duration/timing/severity/associated sxs/prior treatment) Patient is a 61 y.o. female presenting with shortness of breath. The history is provided by the patient.  Shortness of Breath  Associated symptoms include shortness of breath.   patient here complaining of shortness of breath and right-sided pleuritic chest pain x2 days. Patient had left knee arthroscopy done a week ago and had an outpatient lower extremity Doppler which was negative for DVT however her CT scan was positive for a pulmonary embolus subsegmental on the left side. Patient denies an exertional anginal component to her symptoms. Does have a history of gynecological cancer in the past. Was sent here for admission  Past Medical History  Diagnosis Date  . Hyperlipidemia   . Benign neoplasm of stomach   . Dizziness and giddiness   . Gastroparesis   . Esophageal reflux   . Chest pain, unspecified   . CAD (coronary artery disease)     a.  cath 2005: LAD 40%;  b. Myoview 3/12: EF 70%, no ischemia;  c. Myoview 9/13: EF 76%, no ischemia  . HTN (hypertension)   . Palpitations   . Depression   . Aortic stenosis     Echo 8/13: EF 55-60%, Mild LVH, mild AS, mean gradient 12 mmHg,   . CHF (congestive heart failure) 06-13-12    episode after Cancer surgery  . PONV (postoperative nausea and vomiting)   . DM2 (diabetes mellitus, type 2) 06-13-12    NIDDM  . Ovarian cancer 06-13-12    surgery only x2-no recent problems  . Chronic hepatitis C without mention of hepatic coma 06-13-12    "states is active"  . Sleep apnea 06-13-12    uses cpap sometimes    Past Surgical History  Procedure Date  . Cholecystectomy   . Tubal ligation   . Breast biopsy     left  . Abdominal hysterectomy   .  Oophorectomy   . Tonsillectomy   . Abdominal hysterectomy   . Bladder suspension 06-13-12    with posterior repair    Family History  Problem Relation Age of Onset  . Brain cancer Father     spine cancer  . Heart disease Mother   . Breast cancer Sister   . Kidney disease Sister   . Diabetes Brother   . Esophageal cancer Brother   . Diabetes Maternal Aunt   . Diabetes Paternal Aunt   . Stomach cancer Paternal Grandfather     History  Substance Use Topics  . Smoking status: Never Smoker   . Smokeless tobacco: Never Used  . Alcohol Use: No     past hx. , no current use in 11 yrs.    OB History    Grav Para Term Preterm Abortions TAB SAB Ect Mult Living                  Review of Systems  Respiratory: Positive for shortness of breath.   All other systems reviewed and are negative.    Allergies  Sunflower oil; Iodine; Metoclopramide hcl; Metronidazole; and Sulfamethoxazole  Home Medications   Current Outpatient Rx  Name Route Sig Dispense Refill  . ALBUTEROL SULFATE HFA 108 (90 BASE) MCG/ACT IN AERS Inhalation Inhale 2 puffs into the lungs every 6 (  six) hours as needed. For shortness of breath    . ARIPIPRAZOLE 2 MG PO TABS Oral Take 2 mg by mouth daily before breakfast.     . ASCORBIC ACID 500 MG PO TABS Oral Take 500 mg by mouth daily.    . DEXLANSOPRAZOLE 60 MG PO CPDR Oral Take 60 mg by mouth daily before breakfast.    . FAMOTIDINE 20 MG PO TABS Oral Take 20 mg by mouth at bedtime as needed.     . FENOFIBRATE 145 MG PO TABS Oral Take 145 mg by mouth at bedtime.    . FENOFIBRATE 145 MG PO TABS Oral Take 145 mg by mouth daily.    . FUROSEMIDE 40 MG PO TABS Oral Take 40 mg by mouth daily.    [name] GABAPENTIN 300 MG PO CAPS Oral Take 300 mg by mouth 2 (two) times daily.    [name] HYOSCYAMINE SULFATE 0.125 MG PO TABS Oral Take 0.125 mg by mouth every 4 (four) hours as needed. For stomach pain    . LISINOPRIL 20 MG PO TABS Oral Take 20 mg by mouth at bedtime.    . MELOXICAM  7.5 MG PO TABS Oral Take 7.5 mg by mouth 2 (two) times daily.     [name] NITROGLYCERIN 0.4 MG SL SUBL Sublingual Place 0.4 mg under the tongue every 5 (five) minutes as needed. For chest pain    . OXYCODONE-ACETAMINOPHEN 10-325 MG PO TABS Oral Take 1 tablet by mouth every 4 (four) hours as needed for pain. 40 tablet 0  . PAROXETINE HCL ER 25 MG PO TB24 Oral Take 50 mg by mouth at bedtime.     [name] PINDOLOL 5 MG PO TABS Oral Take 5 mg by mouth 2 (two) times daily.     [name] POLYETHYL GLYCOL-PROPYL GLYCOL 0.4-0.3 % OP GEL Ophthalmic Apply 1 drop to eye 2 (two) times daily.     [name] POLYETHYLENE GLYCOL 3350 PO PACK Oral Take 17 g by mouth daily as needed. For constipation    . PRAVASTATIN SODIUM 80 MG PO TABS Oral Take 1 tablet (80 mg total) by mouth daily. 30 tablet 11  . PROMETHAZINE HCL 25 MG PO TABS Oral Take 25 mg by mouth 2 (two) times daily as needed. For nausea    . VITAMIN B-2 100 MG PO TABS Oral Take 100 mg by mouth daily.    [name] SOLIFENACIN SUCCINATE 5 MG PO TABS Oral Take 10 mg by mouth daily.      BP 135/67  Pulse 94  Temp 98.4 F (36.9 C) (Oral)  Resp 18  Ht 5\' 5"  (1.651 m)  Wt 200 lb (90.719 kg)  BMI 33.28 kg/m2  SpO2 95%  Physical Exam  Nursing note and vitals reviewed. Constitutional: She is oriented to person, place, and time. She appears well-developed and well-nourished.  Non-toxic appearance. No distress.  HENT:  Head: Normocephalic and atraumatic.  Eyes: Conjunctivae normal, EOM and lids are normal. Pupils are equal, round, and reactive to light.  Neck: Normal range of motion. Neck supple. No tracheal deviation present. No mass present.  Cardiovascular: Normal rate, regular rhythm and normal heart sounds.  Exam reveals no gallop.   No murmur heard. Pulmonary/Chest: Effort normal and breath sounds normal. No stridor. No respiratory distress. She has no decreased breath sounds. She has no wheezes. She has no rhonchi. She has no rales.  Abdominal: Soft. Normal appearance and bowel  sounds are normal. She exhibits no distension. There is no tenderness. There is  no rebound and no CVA tenderness.  Musculoskeletal: Normal range of motion. She exhibits no edema and no tenderness.       Left lower extremity with swelling. Knee with decreased range of motion. dorsalis pedis pulse 2+.  Neurological: She is alert and oriented to person, place, and time. She has normal strength. No cranial nerve deficit or sensory deficit. GCS eye subscore is 4. GCS verbal subscore is 5. GCS motor subscore is 6.  Skin: Skin is warm and dry. No abrasion and no rash noted.  Psychiatric: She has a normal mood and affect. Her speech is normal and behavior is normal.    ED Course  Procedures (including critical care time)   Labs Reviewed  CBC WITH DIFFERENTIAL  BASIC METABOLIC PANEL  PROTIME-INR  APTT   Ct Angio Chest Pe W/cm &/or Wo Cm  06/21/2012  *RADIOLOGY REPORT*  Clinical Data: History of knee surgery 1 week ago with worsening of shortness of breath, history of ovarian carcinoma in 2000  CT ANGIOGRAPHY CHEST  Technique:  Multidetector CT imaging of the chest using the standard protocol during bolus administration of intravenous contrast. Multiplanar reconstructed images including MIPs were obtained and reviewed to evaluate the vascular anatomy.  Contrast: OMNIPAQUE IOHEXOL 350 MG/ML SOLN initially 100 ml of Omnipaque 350 were given, but the timing was suboptimal. Therefore the timing was revised and 50 ml of Omnipaque 350 were then given on the second imaging sequence.  Comparison: Chest x-ray of 05/24/2012  Findings: On the second contrast injection the pulmonary arteries are moderately well opacified.  The only questionable abnormality is a tiny filling defect within a sub segmental branch of the pulmonary artery to the left upper lobe.  A very small pulmonary embolism in this subsegmental branch cannot be excluded.  No other filling defect is seen throughout the pulmonary arterial system.  The thoracic aorta opacifies with no acute abnormality.  The origins of the great vessels are patent.  No mediastinal or hilar adenopathy is seen.  Imaging through the upper abdomen, there is diffuse fatty infiltration of the liver and the liver appears enlarged.  Surgical clips are present from prior cholecystectomy.  On the lung window images, no lung parenchymal lesion is seen.  No infiltrate is noted.  No pleural effusion is noted.  Incidental calcified splenic granuloma are present from prior granulomatous disease.  No bony abnormality is seen.  IMPRESSION:  1.  Tiny pulmonary embolus within a subsegmental branch of the left upper lobe pulmonary artery.  No other filling defect is seen. 2.  No infiltrate, effusion, or nodule. 3.  Diffuse fatty infiltration of the liver. Suspect hepatomegaly 4.  Calcified splenic granulomas from prior granulomatous disease.   Original Report Authenticated By: Juline Patch, M.D.    US Venous Img Lower Unilateral Left  06/21/2012  *RADIOLOGY REPORT*  Clinical Data: Left calf tightness and left knee pain question DVT; knee surgery 1 week ago  LEFT LOWER EXTREMITY VENOUS DUPLEX ULTRASOUND  Technique:  Gray-scale sonography with graded compression, as well as color Doppler and duplex ultrasound, were performed to evaluate the deep venous system of the lower extremity from the level of the common femoral vein through the popliteal and proximal calf veins. Spectral Doppler was utilized to evaluate flow at rest and with distal augmentation maneuvers.  Comparison:  None  Findings: Deep venous system patent and compressible from left groin through popliteal fossa. Spontaneous venous flow present with intact augmentation and evidence of respiratory phasicity. No intraluminal  thrombus identified. Visualized portion of the greater saphenous system unremarkable. Subcutaneous edema identified at the mid and distal left calf.  IMPRESSION: No evidence of deep venous thrombosis in the left  lower extremity.   Original Report Authenticated By: Lollie Marrow, M.D.      No diagnosis found.    MDM  Patient had heparin started per pharmacy and admitted by [facility] hospitalist        Toy Baker, MD 06/21/12 [id number]

## 2012-06-21 NOTE — H&P (Signed)
[name] [name] is an 61 y.o. female.    PCP: Elizabeth Palau, FNP  Her cardiologist is Dr. Huston Foley. Her orthopedic doctor is Dr. Darrelyn Hillock  Chief Complaint: Chest pain, and shortness of breath  HPI: This is a 61 year old, Caucasian female, with a past medical history of arthritis, GERD, diet controlled diabetes, hypertension, nonobstructive coronary artery disease, who underwent a left knee arthroscopy one week ago. Patient tells me, that she's been having shortness of breath for about a year, but over the last couple weeks it has been worse. And the shortness of breath got worse after the surgical procedure. She's been having also stabbing chest pains in the right chest, shoulder and back area. She presented to [facility] ED about 3 weeks ago, had some workup done, and was released back home. Over the last couple, days she's been having chest pain in the retrosternal area without any radiation. The pain was 5/10 in intensity. It is currently 3/10 in intensity. The pain wasn't related to breathing or cough. No precipitating, aggravating or relieving factors identified. The symptoms associated with a dry cough. Denies any nausea or vomiting. Has been having some dizziness without any syncopal episodes. When she went for a follow up appointment with her orthopedic doctor she mentioned these symptoms to him and he referred her for a CT Angio as well as venous Dopplers. The CT Angio was positive for PE and, so, she was referred to the emergency department. She denies any history of GI bleeding or any other bleeding. Denies any history of previous strokes.   Home Medications: Prior to Admission medications   Medication Sig Start Date End Date Taking? Authorizing Provider  albuterol (PROVENTIL HFA) 108 (90 BASE) MCG/ACT inhaler Inhale 2 puffs into the lungs every 6 (six) hours as needed. For shortness of breath   Yes Historical Provider, MD  ARIPiprazole (ABILIFY) 2 MG tablet Take 2 mg by mouth daily  before breakfast.    Yes Historical Provider, MD  ascorbic acid (VITAMIN C) 500 MG tablet Take 500 mg by mouth daily.   Yes Historical Provider, MD  dexlansoprazole (DEXILANT) 60 MG capsule Take 60 mg by mouth daily before breakfast.   Yes Historical Provider, MD  famotidine (PEPCID) 20 MG tablet Take 20 mg by mouth at bedtime as needed.    Yes Historical Provider, MD  fenofibrate (TRICOR) 145 MG tablet Take 145 mg by mouth at bedtime.   Yes Historical Provider, MD  fenofibrate (TRICOR) 145 MG tablet Take 145 mg by mouth daily.   Yes Historical Provider, MD  furosemide (LASIX) 40 MG tablet Take 40 mg by mouth daily. 04/10/12 04/10/13 Yes Beatrice Lecher, PA  gabapentin (NEURONTIN) 300 MG capsule Take 300 mg by mouth 2 (two) times daily.   Yes Historical Provider, MD  hyoscyamine (LEVSIN, ANASPAZ) 0.125 MG tablet Take 0.125 mg by mouth every 4 (four) hours as needed. For stomach pain   Yes Historical Provider, MD  lisinopril (PRINIVIL,ZESTRIL) 20 MG tablet Take 20 mg by mouth at bedtime.   Yes Historical Provider, MD  meloxicam (MOBIC) 7.5 MG tablet Take 7.5 mg by mouth 2 (two) times daily.    Yes Historical Provider, MD  nitroGLYCERIN (NITROSTAT) 0.4 MG SL tablet Place 0.4 mg under the tongue every 5 (five) minutes as needed. For chest pain   Yes Historical Provider, MD  oxyCODONE-acetaminophen (PERCOCET) 10-325 MG per tablet Take 1 tablet by mouth every 4 (four) hours as needed for pain. 06/14/12  Yes Georges Lynch  Gioffre, MD  PARoxetine (PAXIL-CR) 25 MG 24 hr tablet Take 50 mg by mouth at bedtime.    Yes Historical Provider, MD  pindolol (VISKEN) 5 MG tablet Take 5 mg by mouth 2 (two) times daily.    Yes Historical Provider, MD  Polyethyl Glycol-Propyl Glycol (SYSTANE) 0.4-0.3 % GEL Apply 1 drop to eye 2 (two) times daily.    Yes Historical Provider, MD  polyethylene glycol (MIRALAX / GLYCOLAX) packet Take 17 g by mouth daily as needed. For constipation   Yes Historical Provider, MD  pravastatin  (PRAVACHOL) 80 MG tablet Take 1 tablet (80 mg total) by mouth daily. 08/11/11  Yes Pricilla Riffle, MD  promethazine (PHENERGAN) 25 MG tablet Take 25 mg by mouth 2 (two) times daily as needed. For nausea   Yes Historical Provider, MD  riboflavin (VITAMIN B-2) 100 MG TABS Take 100 mg by mouth daily.   Yes Historical Provider, MD  solifenacin (VESICARE) 5 MG tablet Take 10 mg by mouth daily.   Yes Historical Provider, MD    Allergies:  Allergies  Allergen Reactions  . Sunflower Oil Anaphylaxis  . Iodine Nausea And Vomiting  . Metoclopramide Hcl Other (See Comments)    Nerves tense up, panic attack  . Metronidazole Other (See Comments)    unknown  . Sulfamethoxazole Nausea And Vomiting    Past Medical History: Past Medical History  Diagnosis Date  . Hyperlipidemia   . Benign neoplasm of stomach   . Dizziness and giddiness   . Gastroparesis   . Esophageal reflux   . Chest pain, unspecified   . CAD (coronary artery disease)     a.  cath 2005: LAD 40%;  b. Myoview 3/12: EF 70%, no ischemia;  c. Myoview 9/13: EF 76%, no ischemia  . HTN (hypertension)   . Palpitations   . Depression   . Aortic stenosis     Echo 8/13: EF 55-60%, Mild LVH, mild AS, mean gradient 12 mmHg,   . CHF (congestive heart failure) 06-13-12    episode after Cancer surgery  . PONV (postoperative nausea and vomiting)   . DM2 (diabetes mellitus, type 2) 06-13-12    NIDDM  . Ovarian cancer 06-13-12    surgery only x2-no recent problems  . Chronic hepatitis C without mention of hepatic coma 06-13-12    "states is active"  . Sleep apnea 06-13-12    uses cpap sometimes    Past Surgical History  Procedure Date  . Cholecystectomy   . Tubal ligation   . Breast biopsy     left  . Abdominal hysterectomy   . Oophorectomy   . Tonsillectomy   . Abdominal hysterectomy   . Bladder suspension 06-13-12    with posterior repair    Social History:  reports that she has never smoked. She has never used smokeless  tobacco. She reports that she uses illicit drugs ("Crack" cocaine). She reports that she does not drink alcohol.  Family History:  Family History  Problem Relation Age of Onset  . Brain cancer Father     spine cancer  . Heart disease Mother   . Breast cancer Sister   . Kidney disease Sister   . Diabetes Brother   . Esophageal cancer Brother   . Diabetes Maternal Aunt   . Diabetes Paternal Aunt   . Stomach cancer Paternal Grandfather     Review of Systems - History obtained from the patient General ROS: positive for  - fatigue Psychological ROS: negative Ophthalmic  ROS: negative ENT ROS: negative Allergy and Immunology ROS: negative Hematological and Lymphatic ROS: negative Endocrine ROS: negative Respiratory ROS: as in hpi Cardiovascular ROS: as in hpi Gastrointestinal ROS: no abdominal pain, change in bowel habits, or black or bloody stools Genito-Urinary ROS: chronic symptoms of voiding issues and incontinence Musculoskeletal ROS: negative Neurological ROS: no TIA or stroke symptoms Dermatological ROS: negative  Physical Examination Blood pressure 135/67, pulse 94, temperature 98.4 F (36.9 C), temperature source Oral, resp. rate 18, height 5\' 5"  (1.651 m), weight 90.719 kg (200 lb), SpO2 95.00%.  General appearance: alert, cooperative, appears stated age and no distress Head: Normocephalic, without obvious abnormality, atraumatic Eyes: conjunctivae/corneas clear. PERRL, EOM's intact.  Neck: no adenopathy, no carotid bruit, no JVD, supple, symmetrical, trachea midline and thyroid not enlarged, symmetric, no tenderness/mass/nodules Back: symmetric, no curvature. ROM normal. No CVA tenderness. Resp: clear to auscultation bilaterally Cardio: regular rate and rhythm, S1, S2 normal, no murmur, click, rub or gallop GI: soft, non-tender; bowel sounds normal; no masses,  no organomegaly Extremities: pitting edema noted both legs, erythema involving left leg, warm to touch, no  fluctuation, poor pulses Pulses: 2+ and symmetric Skin: erythema left leg Lymph nodes: Cervical, supraclavicular, and axillary nodes normal. Neurologic: Alert and oriented x 3. No focal deficits.  Laboratory Data: Results for orders placed during the hospital encounter of 06/21/12 (from the past 48 hour(s))  CBC WITH DIFFERENTIAL     Status: Abnormal   Collection Time   06/21/12  4:45 PM      Component Value Range Comment   WBC 4.2  4.0 - 10.5 K/uL    RBC 3.36 (*) 3.87 - 5.11 MIL/uL    Hemoglobin 10.4 (*) 12.0 - 15.0 g/dL    HCT 16.1 (*) 09.6 - 46.0 %    MCV 91.7  78.0 - 100.0 fL    MCH 31.0  26.0 - 34.0 pg    MCHC 33.8  30.0 - 36.0 g/dL    RDW 04.5  40.9 - 81.1 %    Platelets 198  150 - 400 K/uL    Neutrophils Relative 55  43 - 77 %    Neutro Abs 2.3  1.7 - 7.7 K/uL    Lymphocytes Relative 35  12 - 46 %    Lymphs Abs 1.5  0.7 - 4.0 K/uL    Monocytes Relative 7  3 - 12 %    Monocytes Absolute 0.3  0.1 - 1.0 K/uL    Eosinophils Relative 3  0 - 5 %    Eosinophils Absolute 0.1  0.0 - 0.7 K/uL    Basophils Relative 1  0 - 1 %    Basophils Absolute 0.0  0.0 - 0.1 K/uL   BASIC METABOLIC PANEL     Status: Abnormal   Collection Time   06/21/12  4:45 PM      Component Value Range Comment   Sodium 137  135 - 145 mEq/L    Potassium 4.1  3.5 - 5.1 mEq/L    Chloride 101  96 - 112 mEq/L    CO2 25  19 - 32 mEq/L    Glucose, Bld 261 (*) 70 - 99 mg/dL    BUN 21  6 - 23 mg/dL    Creatinine, Ser 9.14  0.50 - 1.10 mg/dL    Calcium 9.0  8.4 - 78.2 mg/dL    GFR calc non Af Amer 60 (*) >90 mL/min    GFR calc Af Amer 70 (*) >90 mL/min  PROTIME-INR     Status: Normal   Collection Time   06/21/12  4:45 PM      Component Value Range Comment   Prothrombin Time 12.7  11.6 - 15.2 seconds    INR 0.96  0.00 - 1.49   APTT     Status: Normal   Collection Time   06/21/12  4:45 PM      Component Value Range Comment   aPTT 30  24 - 37 seconds   TROPONIN I     Status: Normal   Collection Time    06/21/12  4:45 PM      Component Value Range Comment   Troponin I <0.30  <0.30 ng/mL     Radiology Reports: Ct Angio Chest Pe W/cm &/or Wo Cm  06/21/2012  *RADIOLOGY REPORT*  Clinical Data: History of knee surgery 1 week ago with worsening of shortness of breath, history of ovarian carcinoma in 2000  CT ANGIOGRAPHY CHEST  Technique:  Multidetector CT imaging of the chest using the standard protocol during bolus administration of intravenous contrast. Multiplanar reconstructed images including MIPs were obtained and reviewed to evaluate the vascular anatomy.  Contrast: OMNIPAQUE IOHEXOL 350 MG/ML SOLN initially 100 ml of Omnipaque 350 were given, but the timing was suboptimal. Therefore the timing was revised and 50 ml of Omnipaque 350 were then given on the second imaging sequence.  Comparison: Chest x-ray of 05/24/2012  Findings: On the second contrast injection the pulmonary arteries are moderately well opacified.  The only questionable abnormality is a tiny filling defect within a sub segmental branch of the pulmonary artery to the left upper lobe.  A very small pulmonary embolism in this subsegmental branch cannot be excluded.  No other filling defect is seen throughout the pulmonary arterial system. The thoracic aorta opacifies with no acute abnormality.  The origins of the great vessels are patent.  No mediastinal or hilar adenopathy is seen.  Imaging through the upper abdomen, there is diffuse fatty infiltration of the liver and the liver appears enlarged.  Surgical clips are present from prior cholecystectomy.  On the lung window images, no lung parenchymal lesion is seen.  No infiltrate is noted.  No pleural effusion is noted.  Incidental calcified splenic granuloma are present from prior granulomatous disease.  No bony abnormality is seen.  IMPRESSION:  1.  Tiny pulmonary embolus within a subsegmental branch of the left upper lobe pulmonary artery.  No other filling defect is seen. 2.  No  infiltrate, effusion, or nodule. 3.  Diffuse fatty infiltration of the liver. Suspect hepatomegaly 4.  Calcified splenic granulomas from prior granulomatous disease.   Original Report Authenticated By: Juline Patch, M.D.    US Venous Img Lower Unilateral Left  06/21/2012  *RADIOLOGY REPORT*  Clinical Data: Left calf tightness and left knee pain question DVT; knee surgery 1 week ago  LEFT LOWER EXTREMITY VENOUS DUPLEX ULTRASOUND  Technique:  Gray-scale sonography with graded compression, as well as color Doppler and duplex ultrasound, were performed to evaluate the deep venous system of the lower extremity from the level of the common femoral vein through the popliteal and proximal calf veins. Spectral Doppler was utilized to evaluate flow at rest and with distal augmentation maneuvers.  Comparison:  None  Findings: Deep venous system patent and compressible from left groin through popliteal fossa. Spontaneous venous flow present with intact augmentation and evidence of respiratory phasicity. No intraluminal thrombus identified. Visualized portion of the greater saphenous system unremarkable. Subcutaneous  edema identified at the mid and distal left calf.  IMPRESSION: No evidence of deep venous thrombosis in the left lower extremity.   Original Report Authenticated By: Lollie Marrow, M.D.     Assessment/Plan  Principal Problem:  *Acute pulmonary embolism Active Problems:  HEPATITIS C, CHRONIC  DIABETES MELLITUS  Essential hypertension, benign  Esophageal reflux  Cellulitis of left leg   #1 acute pulmonary embolism: This is most likely secondary to recent arthroscopic procedure. Patient will be started on Lovenox. We will try to see, if she will qualify for Xarelto. Case manager will provide assistance with this. We will monitor her overnight and if she remains stable she could potentially be discharged tomorrow. If Xarelto is not an option she will be discharged on warfarin with Lovenox as a  bridge. She will require treatment for 6 months. Chest pain is most likely from PE but will get EKG as well due to change in character of pain. Troponin will be checked as well.  #2 mild cellulitis of the left lower extremity: Venous Doppler was negative for DVT.  She'll be given Keflex. She also has significant edema in both her legs and, so she'll be given an increased dose of Lasix for now.  #3 diabetes: She tells me that she is diet controlled. However, her blood sugar is greater than 200. We will check HbA1c, will put on a sliding scale. Check a fasting level in the morning.  #4 history of hypertension and non-obstructive CAD: Continue with antihypertensive regimen. She had a normal myoview in March 2012 per cardiology notes.  #5 history of GERD: Continue with proton pump inhibitors.  #6 normocytic anemia: Hemoglobin. Will be repeated in the morning. Further workup can be pursued as an outpatient  She's a full code.  DVT, prophylaxis: She'll be on Lovenox.  Further management decisions will depend on results of further testing and patient's response to treatment.   [facility]  [facility] Pager [phone]  06/21/2012, 6:13 PM

## 2012-06-21 NOTE — ED Notes (Signed)
Pt had LT knee arthroscopy done 1 week ago by dr. Myra Rude.  States she was having shob prior to surgery, but since the surgery, it has gotten worse.  Went to Dr's office today and had a work-up done.  A small PE was found

## 2012-06-21 NOTE — ED Notes (Signed)
ZOX:[id number]<AV> Expected date:06/21/12<BR> Expected time:<BR> Means of arrival:<BR> Comments:<BR> Triage pt

## 2012-06-22 LAB — COMPREHENSIVE METABOLIC PANEL
ALT: 33 U/L (ref 0–35)
AST: 31 U/L (ref 0–37)
Albumin: 3.2 g/dL — ABNORMAL LOW (ref 3.5–5.2)
CO2: 28 mEq/L (ref 19–32)
Chloride: 103 mEq/L (ref 96–112)
GFR calc non Af Amer: 61 mL/min — ABNORMAL LOW (ref >90)
Potassium: 3.7 mEq/L (ref 3.5–5.1)
Sodium: 141 mEq/L (ref 135–145)
Total Bilirubin: 0.3 mg/dL (ref 0.3–1.2)

## 2012-06-22 LAB — TROPONIN I: Troponin I: <0.3 ng/mL (ref <0.30)

## 2012-06-22 LAB — HEMOGLOBIN A1C: Mean Plasma Glucose: 183 mg/dL — ABNORMAL HIGH (ref <117)

## 2012-06-22 LAB — CBC
MCH: 29.7 pg (ref 26.0–34.0)
MCHC: 32.2 g/dL (ref 30.0–36.0)
MCV: 92 fL (ref 78.0–100.0)
Platelets: 210 10*3/uL (ref 150–400)

## 2012-06-22 LAB — PROTIME-INR: Prothrombin Time: 12.6 seconds (ref 11.6–15.2)

## 2012-06-22 MED ORDER — RIVAROXABAN 15 MG PO TABS: 15.0000 mg | ORAL_TABLET | Freq: Two times a day (BID) | ORAL | Status: DC

## 2012-06-22 NOTE — Progress Notes (Signed)
RECEIVED CALL FROM DR. HERNANDEZ ABOUT MED ASST,SINCE PRIOR AUTH TO BE DONE ON mONDAY. PROVIDED W/XARELTO 10DY FREE TRIAL WHICH PATIENT MUST ACTIVATE.SPOKE TO PATIENT TEL#[phone],WHO IS ON HER WAY TO HOSPITAL TO GET FREE TRIAL OFFER LEFT W/CHARGE NURSE(ANGELA)TO ACTIVATE,THEN TAKE TO [facility](DOUG-PHARMACIST [phone] CLOSES @ 6P,OR 24HR [facility] ON [phone],[facility] WILL HAVE SCRIPT.MD UPDATED.

## 2012-06-22 NOTE — Discharge Summary (Signed)
Physician Discharge Summary  Patient ID: [name] [name] MRN: [id number] DOB/AGE: 60/14/1952 60 y.o.  Admit date: 06/21/2012 Discharge date: 06/22/2012  Primary Care Physician:  Elizabeth Palau, FNP   Discharge Diagnoses:    Principal Problem:  *Acute pulmonary embolism Active Problems:  HEPATITIS C, CHRONIC  DIABETES MELLITUS  Essential hypertension, benign  Esophageal reflux  Cellulitis of left leg      Medication List     As of 06/22/2012  1:24 PM    STOP taking these medications         nitroGLYCERIN 0.4 MG SL tablet   Commonly known as: NITROSTAT      TAKE these medications         ARIPiprazole 2 MG tablet   Commonly known as: ABILIFY   Take 2 mg by mouth daily before breakfast.      ascorbic acid 500 MG tablet   Commonly known as: VITAMIN C   Take 500 mg by mouth daily.      DEXILANT 60 MG capsule   Generic drug: dexlansoprazole   Take 60 mg by mouth daily before breakfast.      famotidine 20 MG tablet   Commonly known as: PEPCID   Take 20 mg by mouth at bedtime as needed.      fenofibrate 145 MG tablet   Commonly known as: TRICOR   Take 145 mg by mouth daily.      furosemide 40 MG tablet   Commonly known as: LASIX   Take 40 mg by mouth daily.      gabapentin 300 MG capsule   Commonly known as: NEURONTIN   Take 300 mg by mouth 2 (two) times daily.      hyoscyamine 0.125 MG tablet   Commonly known as: LEVSIN, ANASPAZ   Take 0.125 mg by mouth every 4 (four) hours as needed. For stomach pain      lisinopril 20 MG tablet   Commonly known as: PRINIVIL,ZESTRIL   Take 20 mg by mouth at bedtime.      meloxicam 7.5 MG tablet   Commonly known as: MOBIC   Take 7.5 mg by mouth 2 (two) times daily.      oxyCODONE-acetaminophen 10-325 MG per tablet   Commonly known as: PERCOCET   Take 1 tablet by mouth every 4 (four) hours as needed for pain.      PARoxetine 25 MG 24 hr tablet   Commonly known as: PAXIL-CR   Take 50 mg by mouth at bedtime.        pindolol 5 MG tablet   Commonly known as: VISKEN   Take 5 mg by mouth 2 (two) times daily.      polyethylene glycol packet   Commonly known as: MIRALAX / GLYCOLAX   Take 17 g by mouth daily as needed. For constipation      pravastatin 80 MG tablet   Commonly known as: PRAVACHOL   Take 1 tablet (80 mg total) by mouth daily.      promethazine 25 MG tablet   Commonly known as: PHENERGAN   Take 25 mg by mouth 2 (two) times daily as needed. For nausea      PROVENTIL HFA 108 (90 BASE) MCG/ACT inhaler   Generic drug: albuterol   Inhale 2 puffs into the lungs every 6 (six) hours as needed. For shortness of breath      riboflavin 100 MG Tabs   Commonly known as: VITAMIN B-2   Take 100 mg by mouth daily.  Rivaroxaban 15 MG Tabs tablet   Commonly known as: XARELTO   Take 1 tablet (15 mg total) by mouth 2 (two) times daily.      solifenacin 5 MG tablet   Commonly known as: VESICARE   Take 10 mg by mouth daily.      SYSTANE 0.4-0.3 % Gel   Generic drug: Polyethyl Glycol-Propyl Glycol   Apply 1 drop to eye 2 (two) times daily.         Disposition and Follow-up:  Will be discharged home today in stable and improved condition. Will need follow up with her PCP in about 10 days.  Consults:  None   Significant Diagnostic Studies:  Ct Angio Chest Pe W/cm &/or Wo Cm  06/21/2012  *RADIOLOGY REPORT*  Clinical Data: History of knee surgery 1 week ago with worsening of shortness of breath, history of ovarian carcinoma in 2000  CT ANGIOGRAPHY CHEST  Technique:  Multidetector CT imaging of the chest using the standard protocol during bolus administration of intravenous contrast. Multiplanar reconstructed images including MIPs were obtained and reviewed to evaluate the vascular anatomy.  Contrast: OMNIPAQUE IOHEXOL 350 MG/ML SOLN initially 100 ml of Omnipaque 350 were given, but the timing was suboptimal. Therefore the timing was revised and 50 ml of Omnipaque 350 were then given  on the second imaging sequence.  Comparison: Chest x-ray of 05/24/2012  Findings: On the second contrast injection the pulmonary arteries are moderately well opacified.  The only questionable abnormality is a tiny filling defect within a sub segmental branch of the pulmonary artery to the left upper lobe.  A very small pulmonary embolism in this subsegmental branch cannot be excluded.  No other filling defect is seen throughout the pulmonary arterial system. The thoracic aorta opacifies with no acute abnormality.  The origins of the great vessels are patent.  No mediastinal or hilar adenopathy is seen.  Imaging through the upper abdomen, there is diffuse fatty infiltration of the liver and the liver appears enlarged.  Surgical clips are present from prior cholecystectomy.  On the lung window images, no lung parenchymal lesion is seen.  No infiltrate is noted.  No pleural effusion is noted.  Incidental calcified splenic granuloma are present from prior granulomatous disease.  No bony abnormality is seen.  IMPRESSION:  1.  Tiny pulmonary embolus within a subsegmental branch of the left upper lobe pulmonary artery.  No other filling defect is seen. 2.  No infiltrate, effusion, or nodule. 3.  Diffuse fatty infiltration of the liver. Suspect hepatomegaly 4.  Calcified splenic granulomas from prior granulomatous disease.   Original Report Authenticated By: Juline Patch, M.D.    US Venous Img Lower Unilateral Left  06/21/2012  *RADIOLOGY REPORT*  Clinical Data: Left calf tightness and left knee pain question DVT; knee surgery 1 week ago  LEFT LOWER EXTREMITY VENOUS DUPLEX ULTRASOUND  Technique:  Gray-scale sonography with graded compression, as well as color Doppler and duplex ultrasound, were performed to evaluate the deep venous system of the lower extremity from the level of the common femoral vein through the popliteal and proximal calf veins. Spectral Doppler was utilized to evaluate flow at rest and with distal  augmentation maneuvers.  Comparison:  None  Findings: Deep venous system patent and compressible from left groin through popliteal fossa. Spontaneous venous flow present with intact augmentation and evidence of respiratory phasicity. No intraluminal thrombus identified. Visualized portion of the greater saphenous system unremarkable. Subcutaneous edema identified at the mid and distal  left calf.  IMPRESSION: No evidence of deep venous thrombosis in the left lower extremity.   Original Report Authenticated By: Lollie Marrow, M.D.     Brief H and P: For complete details please refer to admission H and P, but in brief patient is a 61 year old, Caucasian female, with a past medical history of arthritis, GERD, diet controlled diabetes, hypertension, nonobstructive coronary artery disease, who underwent a left knee arthroscopy one week ago. Patient tells me, that she's been having shortness of breath for about a year, but over the last couple weeks it has been worse. And the shortness of breath got worse after the surgical procedure. She's been having also stabbing chest pains in the right chest, shoulder and back area. She presented to [facility] ED about 3 weeks ago, had some workup done, and was released back home. Over the last couple, days she's been having chest pain in the retrosternal area without any radiation. The pain was 5/10 in intensity. It is currently 3/10 in intensity. The pain wasn't related to breathing or cough. No precipitating, aggravating or relieving factors identified. The symptoms associated with a dry cough. Denies any nausea or vomiting. Has been having some dizziness without any syncopal episodes. When she went for a follow up appointment with her orthopedic doctor she mentioned these symptoms to him and he referred her for a CT Angio as well as venous Dopplers. The CT Angio was positive for PE and, so, she was referred to the emergency department. She denies any history of GI bleeding or  any other bleeding. Denies any history of previous strokes. We were asked to admit him for further evaluation and management.     Hospital Course:  Principal Problem:  *Acute pulmonary embolism Active Problems:  HEPATITIS C, CHRONIC  DIABETES MELLITUS  Essential hypertension, benign  Esophageal reflux  Cellulitis of left leg    Acute PE -Small clot burden. -Will need to be on anticoagulation for 3-6 months. -We have decided to start her on Xarelto. -She has been given a prescription for 15 mg BID for 21 days, after which her dose will need to be switched to 20 mg daily to complete her treatment. -Have asked her to schedule a follow up with her PCP in 7-10 days, as it is important that she not miss a day of her anticoagulation and will need a prescription for the 20 mg dose.  All rest of her chronic medical issues have been stable this admission and her home medications have not been altered.   Time spent on Discharge: Greater than 30 minutes.  SignedChaya Jan [facility] Pager: [phone] 06/22/2012, 1:24 PM

## 2012-06-22 NOTE — Care Management Note (Addendum)
    Page 1 of 1   06/22/2012     7:08:40 PM   CARE MANAGEMENT NOTE 06/22/2012  Patient:  SARAYAH, CORIO   Account Number:  [id number]  Date Initiated:  06/22/2012  Documentation initiated by:  Lanier Clam  Subjective/Objective Assessment:   ADMITTED W/PE     Action/Plan:   FROM HOME   Anticipated DC Date:  06/22/2012   Anticipated DC Plan:  HOME/SELF CARE      DC Planning Services  Medication Assistance  CM consult      Choice offered to / List presented to:             Status of service:  Completed, signed off Medicare Important Message given?   (If response is "NO", the following Medicare IM given date fields will be blank) Date Medicare IM given:   Date Additional Medicare IM given:    Discharge Disposition:  HOME/SELF CARE  Per UR Regulation:    If discussed at Long Length of Stay Meetings, dates discussed:    Comments:  06/22/12 Syon Tews RN,BSN NCM [phone] RECEIVED CALL FROM DR. HERNANDEZ ABOUT MED ASST FOR XARELTO SINCE PRIOR AUTH TO BE DONE ON MONDAY.SPOKE TO [facility]ARM[facility] WHO WILL HAVE SCRIPT @ THIS PHARMACY OR [facility].PATIENT WILL RECEIVE 10 DAY FREE TRIAL XARELTO & ACTIVATE IT.  06/22/12 Baelynn Schmuhl RN,BSN NCM [phone] SPOKE TO PATIENT ABOUT D/C PLANS,& REQUEST FOR CHECKING ON MED ASST OF XARELTO IF NO COVERAGE.HAS PHARMACY.PATIENT STATES SHE HAS MEDICARE PT A/B,& D(SCRIPT COVERAGE)DOES NOT QUALIFY FOR MED ASST.

## 2012-06-22 NOTE — Progress Notes (Signed)
Initial review for inpatient status is complete. 

## 2012-06-24 NOTE — Telephone Encounter (Signed)
New problem:  Went into the  Hospital on Friday for  blood clots in lungs.  Wanted to Dr. Tenny Craw to know.

## 2012-06-24 NOTE — Telephone Encounter (Signed)
Dr Ross notified 

## 2012-06-26 ENCOUNTER — Telehealth: Payer: Self-pay | Admitting: Internal Medicine

## 2012-06-26 NOTE — Telephone Encounter (Signed)
Patient in hospital recently for PE and was Rx'd Xarelto 15 mg po bid. Patient wanted to know if Dr Tenny Craw wanted to see before 08/09/12 scheduled office visit.  Will forward to Dr Tenny Craw for review

## 2012-06-26 NOTE — Telephone Encounter (Signed)
plz return call to patient regarding recent blood clot surgery and ROV. Pt can be reached at hm#

## 2012-06-27 NOTE — Telephone Encounter (Signed)
Called patient back. Moved up December appointment to 11/8 at 945 am with Dr.Ross.

## 2012-06-27 NOTE — Telephone Encounter (Signed)
Set appt for this month

## 2012-07-01 ENCOUNTER — Other Ambulatory Visit: Payer: Self-pay | Admitting: Internal Medicine

## 2012-07-02 DIAGNOSIS — Z79899 Other long term (current) drug therapy: Secondary | ICD-10-CM | POA: Insufficient documentation

## 2012-07-05 ENCOUNTER — Ambulatory Visit (INDEPENDENT_AMBULATORY_CARE_PROVIDER_SITE_OTHER): Payer: Medicare Other | Admitting: Internal Medicine

## 2012-07-05 ENCOUNTER — Encounter: Payer: Self-pay | Admitting: Internal Medicine

## 2012-07-05 VITALS — BP 118/70 | HR 89 | Ht 65.0 in | Wt 190.0 lb

## 2012-07-05 DIAGNOSIS — R079 Chest pain, unspecified: Secondary | ICD-10-CM

## 2012-07-05 NOTE — Patient Instructions (Signed)
Your physician wants you to follow-up in:  6 months. You will receive a reminder letter in the mail two months in advance. If you don't receive a letter, please call our office to schedule the follow-up appointment.   

## 2012-07-05 NOTE — Progress Notes (Signed)
HPI She has a hx of minimal CAD by cath in 2005 (LAD 40%), DM2, HL, GERD, HTN, chronic HCV, carotid stenosis, chest pain and palpitations. Myoview 3/12: EF 70%, no ischemia. Carotid Dopplers 10/10: RICA 40-59%. She has required esophageal dilatation in the past. She is followed at the [facility] for depression. Last seen by Dr. Tenny Craw in 12/12. She was seen in the emergency room 7/13 with complaints of LE edema. Venous Dopplers were negative the left leg. Hemoglobin was fairly stable at 11.9, creatinine was also normal at 0.84.  I saw her in follow up on 04/10/12. I increased her Lasix. BNP was normal. Urine was positive for UTI. Creatinine was elevated on followup and her Lasix was reduced. Hgb was low and she is to see her PCP. Echo demonstrated normal LVF, mild AS, mild LVH. She is doing well. Notes LE edema better. 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 Notes occ chest pain. Non-exertional. Notes worsening dyspnea with exertion. Also notes wheezing at times. No syncope. No orthopnea, PND.   Patient recently had knee surgery.  Post procedure developed chest tightness  Found to have a PE  SInce d/c from the hospital she still feels fatigued.  Still SOB.     Wt Readings from Last 3 Encounters    Allergies  Allergen Reactions  . Sunflower Oil Anaphylaxis  . Iodine Nausea And Vomiting  . Metoclopramide Hcl Other (See Comments)    Nerves tense up, panic attack  . Metronidazole Other (See Comments)    unknown  . Sulfamethoxazole Nausea And Vomiting    Current Outpatient Prescriptions  Medication Sig Dispense Refill  . albuterol (PROVENTIL HFA) 108 (90 BASE) MCG/ACT inhaler Inhale 2 puffs into the lungs every 6 (six) hours as needed. For shortness of breath      . ARIPiprazole (ABILIFY) 2 MG tablet Take 2 mg by mouth daily before breakfast.       . ascorbic acid (VITAMIN C) 500 MG tablet Take 500 mg by mouth daily.      [name] dexlansoprazole (DEXILANT) 60 MG capsule Take 60 mg by mouth daily before  breakfast.      . famotidine (PEPCID) 20 MG tablet Take 20 mg by mouth at bedtime as needed.       . fenofibrate (TRICOR) 145 MG tablet Take 145 mg by mouth daily.      . furosemide (LASIX) 40 MG tablet Take 40 mg by mouth daily.      [name] gabapentin (NEURONTIN) 300 MG capsule Take 300 mg by mouth 2 (two) times daily.      [name] HYDROcodone-acetaminophen (NORCO/VICODIN) 5-325 MG per tablet Take 1 tablet by mouth every 4 (four) hours as needed.      [name] lisinopril (PRINIVIL,ZESTRIL) 20 MG tablet Take 20 mg by mouth at bedtime.      . meloxicam (MOBIC) 7.5 MG tablet Take 7.5 mg by mouth 2 (two) times daily.       . nitroGLYCERIN (NITROSTAT) 0.4 MG SL tablet Place 0.4 mg under the tongue every 5 (five) minutes as needed.      [name] PARoxetine (PAXIL-CR) 25 MG 24 hr tablet Take 50 mg by mouth at bedtime.       . pindolol (VISKEN) 5 MG tablet Take 5 mg by mouth 2 (two) times daily.       Bertram Gala Glycol-Propyl Glycol (SYSTANE) 0.4-0.3 % GEL Apply 1 drop to eye 2 (two) times daily.       . polyethylene glycol (MIRALAX / GLYCOLAX)  packet Take 17 g by mouth daily as needed. For constipation      . pravastatin (PRAVACHOL) 80 MG tablet Take 1 tablet (80 mg total) by mouth daily.  30 tablet  11  . promethazine (PHENERGAN) 25 MG tablet Take 25 mg by mouth 2 (two) times daily as needed. For nausea      . riboflavin (VITAMIN B-2) 100 MG TABS Take 100 mg by mouth daily.      . Rivaroxaban (XARELTO) 15 MG TABS tablet Take 1 tablet (15 mg total) by mouth 2 (two) times daily.  42 tablet  0  . solifenacin (VESICARE) 5 MG tablet Take 10 mg by mouth daily.      [name] lisinopril (PRINIVIL,ZESTRIL) 20 MG tablet TAKE 1 TABLET BY MOUTH EVERY DAY  30 tablet  0    Past Medical History  Diagnosis Date  . Hyperlipidemia   . Benign neoplasm of stomach   . Dizziness and giddiness   . Gastroparesis   . Esophageal reflux   . Chest pain, unspecified   . CAD (coronary artery disease)     a.  cath 2005: LAD 40%;  b. Myoview 3/12: EF  70%, no ischemia;  c. Myoview 9/13: EF 76%, no ischemia  . HTN (hypertension)   . Palpitations   . Depression   . Aortic stenosis     Echo 8/13: EF 55-60%, Mild LVH, mild AS, mean gradient 12 mmHg,   . CHF (congestive heart failure) 06-13-12    episode after Cancer surgery  . PONV (postoperative nausea and vomiting)   . DM2 (diabetes mellitus, type 2) 06-13-12    NIDDM  . Ovarian cancer 06-13-12    surgery only x2-no recent problems  . Chronic hepatitis C without mention of hepatic coma 06-13-12    "states is active"  . Sleep apnea 06-13-12    uses cpap sometimes    Past Surgical History  Procedure Date  . Cholecystectomy   . Tubal ligation   . Breast biopsy     left  . Abdominal hysterectomy   . Oophorectomy   . Tonsillectomy   . Abdominal hysterectomy   . Bladder suspension 06-13-12    with posterior repair    Family History  Problem Relation Age of Onset  . Brain cancer Father     spine cancer  . Heart disease Mother   . Breast cancer Sister   . Kidney disease Sister   . Diabetes Brother   . Esophageal cancer Brother   . Diabetes Maternal Aunt   . Diabetes Paternal Aunt   . Stomach cancer Paternal Grandfather     History   Social History  . Marital Status: Widowed    Spouse Name: N/A    Number of Children: 3  . Years of Education: N/A   Occupational History  . Disabled    Social History Main Topics  . Smoking status: Never Smoker   . Smokeless tobacco: Never Used  . Alcohol Use: No     Comment: past hx. , no current use in 11 yrs.  . Drug Use: Yes    Special: "Crack" cocaine     Comment: last used 2003-crack cocaine-remains a nonuser  . Sexually Active: No   Other Topics Concern  . Not on file   Social History Narrative  . No narrative on file    Review of Systems:  All systems reviewed.  They are negative to the above problem except as previously stated.  Vital Signs: BP  118/70  Pulse 89  Ht 5\' 5"  (1.651 m)  Wt 190 lb (86.183 kg)   BMI 31.62 kg/m2  SpO2 100%  Physical Exam Patient is in NAD HEENT:  Normocephalic, atraumatic. EOMI, PERRLA.  Neck: JVP is normal.  No bruits.  Lungs: clear to auscultation. No rales no wheezes.  Heart: Regular rate and rhythm. Normal S1, S2. No S3.   No significant murmurs. PMI not displaced.  Abdomen:  Supple, nontender. Normal bowel sounds. No masses. No hepatomegaly.  Extremities:   Good distal pulses throughout. No lower extremity edema.  Musculoskeletal :moving all extremities.  Neuro:   alert and oriented x3.  CN II-XII grossly intact.  EKG:  SR  89 bpm  Incomp RBBB.  LVH.  Nonspecific ST T wave changes.   Assessment and Plan:  1.  Pulm embolus.   Needs to stay on uninterrupted xarelto.  SOB will take time to improve.  Encouraged her to take activities as tolerated.  2.  CAD  I am not convinced of active anginal symptoms.  3.  HL  Keep on statin  4.  HTN  Adequate control  5.  CV disease.  F/u in 6 months.

## 2012-07-18 ENCOUNTER — Other Ambulatory Visit: Payer: Self-pay | Admitting: Internal Medicine

## 2012-07-30 ENCOUNTER — Other Ambulatory Visit: Payer: Self-pay | Admitting: Internal Medicine

## 2012-08-09 ENCOUNTER — Ambulatory Visit: Payer: Medicare Other | Admitting: Internal Medicine

## 2012-08-13 ENCOUNTER — Encounter (HOSPITAL_COMMUNITY): Payer: Self-pay | Admitting: Physical Medicine and Rehabilitation

## 2012-08-13 ENCOUNTER — Emergency Department (HOSPITAL_COMMUNITY)
Admission: EM | Admit: 2012-08-13 | Discharge: 2012-08-13 | Discharge status: Against Medical Advice | Payer: Medicare Other | Attending: Emergency Medicine | Admitting: Emergency Medicine

## 2012-08-13 DIAGNOSIS — I10 Essential (primary) hypertension: Secondary | ICD-10-CM | POA: Insufficient documentation

## 2012-08-13 DIAGNOSIS — Z79899 Other long term (current) drug therapy: Secondary | ICD-10-CM | POA: Insufficient documentation

## 2012-08-13 DIAGNOSIS — R509 Fever, unspecified: Secondary | ICD-10-CM | POA: Insufficient documentation

## 2012-08-13 DIAGNOSIS — K3184 Gastroparesis: Secondary | ICD-10-CM | POA: Insufficient documentation

## 2012-08-13 DIAGNOSIS — I2510 Atherosclerotic heart disease of native coronary artery without angina pectoris: Secondary | ICD-10-CM | POA: Insufficient documentation

## 2012-08-13 DIAGNOSIS — R109 Unspecified abdominal pain: Secondary | ICD-10-CM | POA: Insufficient documentation

## 2012-08-13 DIAGNOSIS — R300 Dysuria: Secondary | ICD-10-CM | POA: Insufficient documentation

## 2012-08-13 DIAGNOSIS — E785 Hyperlipidemia, unspecified: Secondary | ICD-10-CM | POA: Insufficient documentation

## 2012-08-13 DIAGNOSIS — Z8543 Personal history of malignant neoplasm of ovary: Secondary | ICD-10-CM | POA: Insufficient documentation

## 2012-08-13 DIAGNOSIS — E1149 Type 2 diabetes mellitus with other diabetic neurological complication: Secondary | ICD-10-CM | POA: Insufficient documentation

## 2012-08-13 DIAGNOSIS — R197 Diarrhea, unspecified: Secondary | ICD-10-CM | POA: Insufficient documentation

## 2012-08-13 DIAGNOSIS — R1110 Vomiting, unspecified: Secondary | ICD-10-CM | POA: Insufficient documentation

## 2012-08-13 DIAGNOSIS — I509 Heart failure, unspecified: Secondary | ICD-10-CM | POA: Insufficient documentation

## 2012-08-13 LAB — COMPREHENSIVE METABOLIC PANEL
AST: 54 U/L — ABNORMAL HIGH (ref 0–37)
Albumin: 3.6 g/dL (ref 3.5–5.2)
Calcium: 9.8 mg/dL (ref 8.4–10.5)
Creatinine, Ser: 0.94 mg/dL (ref 0.50–1.10)
GFR calc non Af Amer: 65 mL/min — ABNORMAL LOW (ref >90)

## 2012-08-13 LAB — CBC WITH DIFFERENTIAL/PLATELET
Basophils Absolute: 0 10*3/uL (ref 0.0–0.1)
Basophils Relative: 0 % (ref 0–1)
Eosinophils Absolute: 0.1 10*3/uL (ref 0.0–0.7)
Eosinophils Relative: 1 % (ref 0–5)
HCT: 34.5 % — ABNORMAL LOW (ref 36.0–46.0)
MCHC: 33 g/dL (ref 30.0–36.0)
MCV: 88.7 fL (ref 78.0–100.0)
Monocytes Absolute: 0.4 10*3/uL (ref 0.1–1.0)
RDW: 12.5 % (ref 11.5–15.5)

## 2012-08-13 NOTE — ED Notes (Signed)
No answer x 3 she threatened to leave earlier

## 2012-08-13 NOTE — ED Notes (Signed)
Pt presents to department for evaluation of chills/fever, lower abdominal pain, diarrhea, and dysuria. Ongoing for several days. States she recently stopped some of her medications, recently started taking Nucynta.. 8/10 pain at the time. She is conscious alert and oriented x4.

## 2012-08-14 ENCOUNTER — Encounter (INDEPENDENT_AMBULATORY_CARE_PROVIDER_SITE_OTHER): Payer: Medicare Other | Admitting: Ophthalmology

## 2012-08-16 ENCOUNTER — Other Ambulatory Visit: Payer: Self-pay | Admitting: Internal Medicine

## 2012-09-02 ENCOUNTER — Other Ambulatory Visit: Payer: Self-pay | Admitting: Internal Medicine

## 2012-09-16 ENCOUNTER — Ambulatory Visit: Payer: Medicare Other | Admitting: Gastroenterology

## 2012-09-16 ENCOUNTER — Other Ambulatory Visit: Payer: Self-pay | Admitting: Internal Medicine

## 2012-09-19 ENCOUNTER — Telehealth: Payer: Self-pay | Admitting: Gastroenterology

## 2012-09-19 NOTE — Telephone Encounter (Signed)
Message copied by Arna Snipe on Thu Sep 19, 2012 11:30 AM ------      Message from: Marlowe Kays      Created: Mon Sep 16, 2012  2:16 PM      Regarding: RE: Cx appt today 09-16-12 at 1:45pm       No charge      ----- Message -----         From: Arna Snipe         Sent: 09/16/2012  12:57 PM           To: Marlowe Kays, CMA      Subject: Cx appt today 09-16-12 at 1:45pm                          Was confused with her appointments. Unable to make it-thought it was this friday

## 2012-10-02 ENCOUNTER — Other Ambulatory Visit: Payer: Self-pay | Admitting: Internal Medicine

## 2012-10-16 ENCOUNTER — Ambulatory Visit (INDEPENDENT_AMBULATORY_CARE_PROVIDER_SITE_OTHER): Payer: Medicare Other | Admitting: Gastroenterology

## 2012-10-16 ENCOUNTER — Encounter: Payer: Self-pay | Admitting: Gastroenterology

## 2012-10-16 VITALS — BP 110/62 | HR 60 | Ht 65.0 in | Wt 190.0 lb

## 2012-10-16 DIAGNOSIS — R197 Diarrhea, unspecified: Secondary | ICD-10-CM

## 2012-10-16 DIAGNOSIS — K3184 Gastroparesis: Secondary | ICD-10-CM

## 2012-10-16 NOTE — Progress Notes (Signed)
History of Present Illness:  [name] [name] has returned for evaluation of stomach burning and diarrhea. She has well-established gastroparesis determined by gastric emptying scan. She has not tolerated erythromycin or Reglan.  She complains of postprandial stomach burning and frequent nausea. Phenergan controlled her nausea. She also complains of diarrhea consisting of 2 loose stools a day. Urgency is intermittent.  Colonoscopy in 2005 was negative.    Review of Systems: Pertinent positive and negative review of systems were noted in the above HPI section. All other review of systems were otherwise negative.    Current Medications, Allergies, Past Medical History, Past Surgical History, Family History and Social History were reviewed in [facility] electronic medical record  Vital signs were reviewed in today's medical record. Physical Exam: General: Well developed , well nourished, no acute distress

## 2012-10-16 NOTE — Patient Instructions (Addendum)
We are sending in a Domperidone prescription for you to a compounding [facility] in [place] There phone number is [phone] Use Imodium 2 in the mornings if you have diarrhea We will contact you when the Gastroparesis study becomes available

## 2012-10-16 NOTE — Assessment & Plan Note (Addendum)
Stomach burning and nausea are probably related to gastroparesis. Domperidone has been suggested in the past but she was unable to afford it. She's willing to try it now. Will begin 10 mg a.c. and at bedtime

## 2012-10-16 NOTE — Assessment & Plan Note (Signed)
Diarrhea is chronic and appears to be rather mild. She was instructed to take 2 Imodium every morning

## 2012-10-24 ENCOUNTER — Other Ambulatory Visit: Payer: Self-pay

## 2012-10-29 ENCOUNTER — Telehealth: Payer: Self-pay | Admitting: Gastroenterology

## 2012-10-30 MED ORDER — PROMETHAZINE HCL 25 MG PO TABS: 25.0000 mg | ORAL_TABLET | Freq: Two times a day (BID) | ORAL | Status: DC | PRN

## 2012-10-30 MED ORDER — POLYETHYLENE GLYCOL 3350 17 G PO PACK: 17.0000 g | PACK | Freq: Every day | ORAL | Status: DC | PRN

## 2012-10-30 NOTE — Telephone Encounter (Signed)
Ordered Promethazine for pt Ordered Miralax for pt Tried to contact pt about her questions but i can not get in touch with her. It sounds like someone answers her phone then hangs up

## 2012-10-31 ENCOUNTER — Telehealth: Payer: Self-pay | Admitting: Gastroenterology

## 2012-10-31 ENCOUNTER — Encounter (INDEPENDENT_AMBULATORY_CARE_PROVIDER_SITE_OTHER): Payer: Medicare Other | Admitting: Ophthalmology

## 2012-10-31 DIAGNOSIS — E1139 Type 2 diabetes mellitus with other diabetic ophthalmic complication: Secondary | ICD-10-CM

## 2012-10-31 DIAGNOSIS — E11319 Type 2 diabetes mellitus with unspecified diabetic retinopathy without macular edema: Secondary | ICD-10-CM

## 2012-10-31 DIAGNOSIS — H43819 Vitreous degeneration, unspecified eye: Secondary | ICD-10-CM

## 2012-10-31 DIAGNOSIS — H2510 Age-related nuclear cataract, unspecified eye: Secondary | ICD-10-CM

## 2012-10-31 DIAGNOSIS — E1165 Type 2 diabetes mellitus with hyperglycemia: Secondary | ICD-10-CM

## 2012-10-31 DIAGNOSIS — H35039 Hypertensive retinopathy, unspecified eye: Secondary | ICD-10-CM

## 2012-11-03 ENCOUNTER — Other Ambulatory Visit: Payer: Self-pay | Admitting: Internal Medicine

## 2012-11-04 ENCOUNTER — Telehealth: Payer: Self-pay | Admitting: Gastroenterology

## 2012-11-05 MED ORDER — AMBULATORY NON FORMULARY MEDICATION: Status: DC

## 2012-11-05 NOTE — Telephone Encounter (Signed)
DR Arlyce Dice  FYI- Marlette SAID SHE NEVER FILLED HER DOMPERIDONE RX FROM LAST YEAR SO I SENT HER A NEW SCRIPT

## 2012-11-05 NOTE — Telephone Encounter (Signed)
Wants to know when next colonoscopy is due   09/2016 due explained to pt

## 2012-11-06 ENCOUNTER — Other Ambulatory Visit: Payer: Self-pay | Admitting: Physician Assistant

## 2012-12-02 ENCOUNTER — Other Ambulatory Visit: Payer: Self-pay | Admitting: Internal Medicine

## 2013-02-20 DIAGNOSIS — B029 Zoster without complications: Secondary | ICD-10-CM | POA: Insufficient documentation

## 2013-03-14 ENCOUNTER — Encounter: Payer: Self-pay | Admitting: *Deleted

## 2013-03-14 ENCOUNTER — Telehealth: Payer: Self-pay | Admitting: *Deleted

## 2013-03-14 NOTE — Telephone Encounter (Signed)
Faxed to Dr. Halina Andreas RN

## 2013-03-14 NOTE — Telephone Encounter (Signed)
I spoke with pt about her Xarelto. She stopped taking it about a month ago. She is having no cardiac complaints at this time.  She was also due for follow-up visit with Dr. Tenny Craw   Appointment scheduled & reminder mailed to her. Mylo Red RN

## 2013-03-26 ENCOUNTER — Other Ambulatory Visit: Payer: Self-pay | Admitting: Internal Medicine

## 2013-03-28 HISTORY — PX: CATARACT EXTRACTION W/ INTRAOCULAR LENS  IMPLANT, BILATERAL: SHX1307

## 2013-03-31 ENCOUNTER — Telehealth: Payer: Self-pay | Admitting: Gastroenterology

## 2013-03-31 NOTE — Telephone Encounter (Signed)
Spoke with pt and she is wanting a refill on her visicare. Discussed with pt that she should call her PCP for that refill. Pt states she will call their office.

## 2013-04-05 ENCOUNTER — Other Ambulatory Visit: Payer: Self-pay | Admitting: Physician Assistant

## 2013-04-10 ENCOUNTER — Emergency Department (HOSPITAL_COMMUNITY): Payer: Medicare Other

## 2013-04-10 ENCOUNTER — Encounter (HOSPITAL_COMMUNITY): Payer: Self-pay | Admitting: Emergency Medicine

## 2013-04-10 ENCOUNTER — Emergency Department (HOSPITAL_COMMUNITY)
Admission: EM | Admit: 2013-04-10 | Discharge: 2013-04-10 | Disposition: A | Discharge status: Home / Self Care | Payer: Medicare Other | Attending: Emergency Medicine | Admitting: Emergency Medicine

## 2013-04-10 DIAGNOSIS — Z791 Long term (current) use of non-steroidal anti-inflammatories (NSAID): Secondary | ICD-10-CM | POA: Insufficient documentation

## 2013-04-10 DIAGNOSIS — R0789 Other chest pain: Secondary | ICD-10-CM | POA: Insufficient documentation

## 2013-04-10 DIAGNOSIS — K219 Gastro-esophageal reflux disease without esophagitis: Secondary | ICD-10-CM | POA: Insufficient documentation

## 2013-04-10 DIAGNOSIS — I2510 Atherosclerotic heart disease of native coronary artery without angina pectoris: Secondary | ICD-10-CM | POA: Insufficient documentation

## 2013-04-10 DIAGNOSIS — E785 Hyperlipidemia, unspecified: Secondary | ICD-10-CM | POA: Insufficient documentation

## 2013-04-10 DIAGNOSIS — E119 Type 2 diabetes mellitus without complications: Secondary | ICD-10-CM | POA: Insufficient documentation

## 2013-04-10 DIAGNOSIS — Z79899 Other long term (current) drug therapy: Secondary | ICD-10-CM | POA: Insufficient documentation

## 2013-04-10 DIAGNOSIS — I10 Essential (primary) hypertension: Secondary | ICD-10-CM | POA: Insufficient documentation

## 2013-04-10 DIAGNOSIS — Z8719 Personal history of other diseases of the digestive system: Secondary | ICD-10-CM | POA: Insufficient documentation

## 2013-04-10 DIAGNOSIS — Z8619 Personal history of other infectious and parasitic diseases: Secondary | ICD-10-CM | POA: Insufficient documentation

## 2013-04-10 DIAGNOSIS — Z7901 Long term (current) use of anticoagulants: Secondary | ICD-10-CM | POA: Insufficient documentation

## 2013-04-10 DIAGNOSIS — I509 Heart failure, unspecified: Secondary | ICD-10-CM | POA: Insufficient documentation

## 2013-04-10 DIAGNOSIS — Z85028 Personal history of other malignant neoplasm of stomach: Secondary | ICD-10-CM | POA: Insufficient documentation

## 2013-04-10 DIAGNOSIS — Z8543 Personal history of malignant neoplasm of ovary: Secondary | ICD-10-CM | POA: Insufficient documentation

## 2013-04-10 DIAGNOSIS — R059 Cough, unspecified: Secondary | ICD-10-CM | POA: Insufficient documentation

## 2013-04-10 DIAGNOSIS — Z8679 Personal history of other diseases of the circulatory system: Secondary | ICD-10-CM | POA: Insufficient documentation

## 2013-04-10 DIAGNOSIS — F329 Major depressive disorder, single episode, unspecified: Secondary | ICD-10-CM | POA: Insufficient documentation

## 2013-04-10 DIAGNOSIS — R05 Cough: Secondary | ICD-10-CM | POA: Insufficient documentation

## 2013-04-10 DIAGNOSIS — F3289 Other specified depressive episodes: Secondary | ICD-10-CM | POA: Insufficient documentation

## 2013-04-10 DIAGNOSIS — G4730 Sleep apnea, unspecified: Secondary | ICD-10-CM | POA: Insufficient documentation

## 2013-04-10 DIAGNOSIS — R079 Chest pain, unspecified: Secondary | ICD-10-CM

## 2013-04-10 LAB — POCT I-STAT TROPONIN I
Troponin i, poc: 0 ng/mL (ref 0.00–0.08)
Troponin i, poc: 0 ng/mL (ref 0.00–0.08)

## 2013-04-10 LAB — CBC WITH DIFFERENTIAL/PLATELET
Eosinophils Relative: 3 % (ref 0–5)
HCT: 36.2 % (ref 36.0–46.0)
Lymphocytes Relative: 26 % (ref 12–46)
Lymphs Abs: 1.6 10*3/uL (ref 0.7–4.0)
MCV: 87.9 fL (ref 78.0–100.0)
Platelets: 238 10*3/uL (ref 150–400)
RBC: 4.12 MIL/uL (ref 3.87–5.11)
WBC: 6 10*3/uL (ref 4.0–10.5)

## 2013-04-10 LAB — BASIC METABOLIC PANEL
CO2: 24 mEq/L (ref 19–32)
Calcium: 9.9 mg/dL (ref 8.4–10.5)
Glucose, Bld: 237 mg/dL — ABNORMAL HIGH (ref 70–99)
Sodium: 138 mEq/L (ref 135–145)

## 2013-04-10 MED ORDER — TECHNETIUM TC 99M DIETHYLENETRIAME-PENTAACETIC ACID
41.9000 | Freq: Once | INTRAVENOUS | Status: AC | PRN
  Administered 2013-04-10: 41.9 via INTRAVENOUS

## 2013-04-10 MED ORDER — TECHNETIUM TO 99M ALBUMIN AGGREGATED
5.0000 | Freq: Once | INTRAVENOUS | Status: AC | PRN
  Administered 2013-04-10: 5 via INTRAVENOUS

## 2013-04-10 NOTE — ED Notes (Signed)
Pt c/o of SOB. "Feels like cant get enough O2 in me". Hx PE. Also c/o of weakness, fatigue. Denies recent travel. Denies use of anticoagulant. Pt discontinued Xrrelto 4 months ago.

## 2013-04-10 NOTE — ED Provider Notes (Signed)
CSN: [id number]     Arrival date & time 04/10/13  1506 History     First MD Initiated Contact with Patient 04/10/13 1516     Chief Complaint  Patient presents with  . Shortness of Breath   (Consider location/radiation/quality/duration/timing/severity/associated sxs/prior Treatment) HPI Comments: Patient is a 62 year old female with a past medical history of hypertension, CAD, CHF, previous PE, and hepatitis C who presents with SOB since this morning when she woke up. Symptoms started suddenly and progressively worsened since the onset. Patient states she feels like she "is not getting enough oxygen." On the way to the hospital, patient reports associated severe, squeezing chest pain located in her left chest that radiated to her back. The pain lasted about 30 minutes before resolving. No aggravating/alleviating factors. Patient reports associated leg swelling. Patient reports she felt like she did when she had her PE. She was taking Xarelto but stopped 2 weeks ago because the 6 month time frame was done. Patient reports recent eye surgery 2 weeks ago.    Past Medical History  Diagnosis Date  . Hyperlipidemia   . Benign neoplasm of stomach   . Dizziness and giddiness   . Gastroparesis   . Esophageal reflux   . Chest pain, unspecified   . CAD (coronary artery disease)     a.  cath 2005: LAD 40%;  b. Myoview 3/12: EF 70%, no ischemia;  c. Myoview 9/13: EF 76%, no ischemia  . HTN (hypertension)   . Palpitations   . Depression   . Aortic stenosis     Echo 8/13: EF 55-60%, Mild LVH, mild AS, mean gradient 12 mmHg,   . CHF (congestive heart failure) 06-13-12    episode after Cancer surgery  . PONV (postoperative nausea and vomiting)   . DM2 (diabetes mellitus, type 2) 06-13-12    NIDDM  . Ovarian cancer 06-13-12    surgery only x2-no recent problems  . Chronic hepatitis C without mention of hepatic coma 06-13-12    "states is active"  . Sleep apnea 06-13-12    uses cpap sometimes    Past Surgical History  Procedure Laterality Date  . Cholecystectomy    . Tubal ligation    . Breast biopsy      left  . Abdominal hysterectomy    . Oophorectomy    . Tonsillectomy    . Abdominal hysterectomy    . Bladder suspension  06-13-12    with posterior repair  . Knee arthroscopy      left   Family History  Problem Relation Age of Onset  . Brain cancer Father     spine cancer  . Heart disease Mother   . Breast cancer Sister   . Kidney disease Sister   . Diabetes Brother   . Esophageal cancer Brother   . Diabetes Maternal Aunt   . Diabetes Paternal Aunt   . Stomach cancer Paternal Grandfather    History  Substance Use Topics  . Smoking status: Never Smoker   . Smokeless tobacco: Never Used  . Alcohol Use: No     Comment: past hx. , no current use in 11 yrs.   OB History   Grav Para Term Preterm Abortions TAB SAB Ect Mult Living                 Review of Systems  Respiratory: Positive for cough and shortness of breath.   Cardiovascular: Positive for chest pain.  All other systems reviewed and are  negative.    Allergies  Sunflower oil; Iodine; Metoclopramide hcl; Metronidazole; and Sulfamethoxazole  Home Medications   Current Outpatient Rx  Name  Route  Sig  Dispense  Refill  . albuterol (PROVENTIL HFA) 108 (90 BASE) MCG/ACT inhaler   Inhalation   Inhale 2 puffs into the lungs every 6 (six) hours as needed. For shortness of breath         . ARIPiprazole (ABILIFY) 2 MG tablet   Oral   Take 2 mg by mouth daily before breakfast.          . ascorbic acid (VITAMIN C) 500 MG tablet   Oral   Take 500 mg by mouth at bedtime.          [name] dexlansoprazole (DEXILANT) 60 MG capsule   Oral   Take 60 mg by mouth daily before breakfast.         . famotidine (PEPCID) 20 MG tablet      TAKE 1 TABLET BY MOUTH AT BEDTIME AS NEEDED FOR HEARTBURN   30 tablet   3     Patient has to keep 05/16/2013 appt with cardiologi ...   . furosemide (LASIX) 40  MG tablet   Oral   Take 40 mg by mouth daily.         [name] gabapentin (NEURONTIN) 300 MG capsule   Oral   Take 300 mg by mouth 2 (two) times daily.         [name] lisinopril (PRINIVIL,ZESTRIL) 20 MG tablet      TAKE 1 TABLET BY MOUTH EVERY DAY   30 tablet   6   . meloxicam (MOBIC) 7.5 MG tablet   Oral   Take 7.5 mg by mouth 2 (two) times daily.          . metFORMIN (GLUCOPHAGE) 500 MG tablet   Oral   Take 500 mg by mouth 2 (two) times daily with a meal.         . nitroGLYCERIN (NITROSTAT) 0.4 MG SL tablet   Sublingual   Place 0.4 mg under the tongue every 5 (five) minutes as needed.         [name] PARoxetine (PAXIL-CR) 25 MG 24 hr tablet   Oral   Take 50 mg by mouth at bedtime.          . pindolol (VISKEN) 5 MG tablet   Oral   Take 5 mg by mouth 2 (two) times daily.          Bertram Gala Glycol-Propyl Glycol (SYSTANE) 0.4-0.3 % GEL   Ophthalmic   Apply 1 drop to eye 2 (two) times daily.          . polyethylene glycol (MIRALAX / GLYCOLAX) packet   Oral   Take 17 g by mouth daily as needed. For constipation   14 each   6   . Tapentadol HCl (NUCYNTA PO)   Oral   Take by mouth.         . AMBULATORY NON FORMULARY MEDICATION      Medication Name:  DOMPERIDONE 10MG  1 BY MOUTH 4 TIMES A DAY  1 30 MINUTES BEFORE MEALS AND ONE AT BEDTIME   120 capsule   3   . fenofibrate (TRICOR) 145 MG tablet      TAKE 1 TABLET BY MOUTH DAILY   30 tablet   2   . pravastatin (PRAVACHOL) 80 MG tablet      TAKE 1 TABLET BY MOUTH EVERY DAY  30 tablet   6   . promethazine (PHENERGAN) 25 MG tablet   Oral   Take 1 tablet (25 mg total) by mouth 2 (two) times daily as needed. For nausea   30 tablet   2   . riboflavin (VITAMIN B-2) 100 MG TABS   Oral   Take 100 mg by mouth daily.         [name] EXPIRED: Rivaroxaban (XARELTO) 15 MG TABS tablet   Oral   Take 1 tablet (15 mg total) by mouth 2 (two) times daily.   42 tablet   0   . solifenacin (VESICARE) 5 MG tablet    Oral   Take 10 mg by mouth daily.          BP 152/65  Pulse 92  Temp(Src) 98.3 F (36.8 C) (Oral)  Resp 22  SpO2 93% Physical Exam  Nursing note and vitals reviewed. Constitutional: She is oriented to person, place, and time. She appears well-developed and well-nourished. No distress.  HENT:  Head: Normocephalic and atraumatic.  Eyes: Conjunctivae and EOM are normal. Pupils are equal, round, and reactive to light. No scleral icterus.  Neck: Normal range of motion.  Cardiovascular: Normal rate and regular rhythm.  Exam reveals no gallop and no friction rub.   No murmur heard. Pulmonary/Chest: Effort normal and breath sounds normal. She has no wheezes. She has no rales. She exhibits no tenderness.  Abdominal: Soft. She exhibits no distension. There is no tenderness. There is no rebound and no guarding.  Musculoskeletal: Normal range of motion.  No calf tenderness to palpation bilaterally. Lower extremity edema noted bilaterally.   Neurological: She is alert and oriented to person, place, and time. Coordination normal.  Speech is goal-oriented. Moves limbs without ataxia.   Skin: Skin is warm and dry.  Psychiatric: She has a normal mood and affect. Her behavior is normal.    ED Course   Procedures (including critical care time)   Date: 04/10/2013  Rate: 90  Rhythm: normal sinus rhythm  QRS Axis: left  Intervals: normal  ST/T Wave abnormalities: normal  Conduction Disutrbances:none  Narrative Interpretation: NSR with possible LVH unchanged from previous  Old EKG Reviewed: unchanged   Labs Reviewed  BASIC METABOLIC PANEL - Abnormal; Notable for the following:    Glucose, Bld 237 (*)    GFR calc non Af Amer 66 (*)    GFR calc Af Amer 76 (*)    All other components within normal limits  PRO B NATRIURETIC PEPTIDE - Abnormal; Notable for the following:    Pro B Natriuretic peptide (BNP) 637.0 (*)    All other components within normal limits  CBC WITH DIFFERENTIAL  POCT  I-STAT TROPONIN I   Dg Chest 2 View  04/10/2013   *RADIOLOGY REPORT*  Clinical Data: Shortness of breath.  CHEST - 2 VIEW  Comparison: 05/24/2012.  Findings: Cardiac silhouette, mediastinal and hilar contours are within normal limits and stable.  Stable mild elevation of the right hemidiaphragm.  No acute pulmonary findings.  The bony thorax is intact.  IMPRESSION: No acute cardiopulmonary findings.   Original Report Authenticated By: Rudie Meyer, M.D.   Nm Pulmonary Perf And Vent  04/10/2013   *RADIOLOGY REPORT*  Clinical Data:  Chest pain and shortness of breath.  NUCLEAR MEDICINE VENTILATION - PERFUSION LUNG SCAN  Technique:  Wash-in, equilibrium, and wash-out phase ventilation images were obtained using Xe-133 gas.  Perfusion images were obtained in multiple projections after intravenous injection of Tc- 102m  MAA.  Radiopharmaceuticals:  41.9 mCi aerosolized technetium DTPA and 5.0 mCi Tc-43m MAA.  Comparison:  Chest x-ray 04/10/2013.  Findings: The ventilation scan is normal except for a small defect on the LPO view in the upper left posterior chest.  This is a matching defect on the perfusion scan.  No other ventilation or perfusion defects are identified.  IMPRESSION: Negative VQ scan for pulmonary embolism.   Original Report Authenticated By: Rudie Meyer, M.D.   1. Chest pain     MDM  4:14 PM Labs, chest xray, troponin and EKG pending. Patient's oxygen saturation is 91% on room air with stable remaining vitals. Patient denies wearing oxygen at home. No d-dimer ordered due to history of PE. Patient is high risk so she will likely have a CT angio of chest.   5:23 PM Labs unremarkable, troponin negative, BNP unremarkable, and chest xray shows now acute changes. Patient will have VQ scan to evaluate for PE due to IV contrast allergy.   Patient signed out to Arthor Captain, PA-C.      Emilia Beck, PA-C 04/11/13 [phone]

## 2013-04-10 NOTE — ED Provider Notes (Signed)
She reports she's had a history of PE in the past after having knee surgery. She states she has been having some shortness of breath recently and got a lot worse today to the point where "I can't get enough oxygen in the". She states she's been having pain in her legs her past few months is getting worse. She states she has pain in her calf.  Patient is alert and cooperative. She appears comfortable at this time on oxygen.  Medical screening examination/treatment/procedure(s) were conducted as a shared visit with non-physician practitioner(s) and myself.  I personally evaluated the patient during the encounter  Devoria Albe, MD, Franz Dell, MD 04/10/13 [phone]

## 2013-04-10 NOTE — ED Notes (Signed)
Pt ambulated in hallway 02 sat remained 92-95% HR 94-96. Pt tolerated well no complaint of SOB

## 2013-04-11 ENCOUNTER — Other Ambulatory Visit: Payer: Self-pay | Admitting: Gastroenterology

## 2013-04-14 NOTE — ED Provider Notes (Signed)
See prior note  Devoria Albe, MD, Franz Dell, MD 04/14/13 [phone]

## 2013-04-18 ENCOUNTER — Encounter: Payer: Self-pay | Admitting: Internal Medicine

## 2013-04-18 ENCOUNTER — Telehealth: Payer: Self-pay | Admitting: Emergency Medicine

## 2013-04-18 ENCOUNTER — Ambulatory Visit (INDEPENDENT_AMBULATORY_CARE_PROVIDER_SITE_OTHER): Payer: Medicare Other | Admitting: Internal Medicine

## 2013-04-18 VITALS — BP 113/79 | HR 91 | Ht 65.0 in | Wt 192.0 lb

## 2013-04-18 DIAGNOSIS — R002 Palpitations: Secondary | ICD-10-CM

## 2013-04-18 DIAGNOSIS — R0609 Other forms of dyspnea: Secondary | ICD-10-CM

## 2013-04-18 LAB — BASIC METABOLIC PANEL
Calcium: 9.3 mg/dL (ref 8.4–10.5)
Glucose, Bld: 249 mg/dL — ABNORMAL HIGH (ref 70–99)
Sodium: 141 mEq/L (ref 135–145)

## 2013-04-18 LAB — BRAIN NATRIURETIC PEPTIDE: Brain Natriuretic Peptide: 48.8 pg/mL (ref 0.0–100.0)

## 2013-04-18 NOTE — Telephone Encounter (Signed)
Appt set for 04-22-13 at 3:30pm.I spoke with Dr. Tenny Craw and advised of appt time and date. Carron Curie, CMA

## 2013-04-18 NOTE — Progress Notes (Signed)
HPIShe has a hx of minimal CAD by cath in 2005 (LAD 40%), DM2, HL, GERD, HTN, chronic HCV, carotid stenosis, chest pain and palpitations. Myoview 3/12: EF 70%, no ischemia. Carotid Dopplers 10/10: RICA 40-59%. She has required esophageal dilatation in the past. She is followed at the [facility] for depression. I saw her in follow up on 04/10/12. Echo demonstrated normal LVF, mild AS, mild LVH She had knee surgery last year  Post procedure developed a PE I saw her last in November 2013. The patinet was recently seen in ER for chest pressure  Sent out of ER  V/Q scan negative  SInce ER visit she still feels fatigued. Still SOB. Notes some wheezing. Wt Readings from Last 3 Encounters    Allergies   Allergen  Reactions   .  Sunflower Oil  Anaphylaxis   .  Iodine  Nausea And Vomiting   .  Metoclopramide Hcl  Other (See Comments)     Nerves tense up, panic attack   .  Metronidazole  Other (See Comments)     unknown   .  Sulfamethoxazole  Nausea And Vomiting    Current Outpatient Prescriptions   Medication  Sig  Dispense  Refill   .  albuterol (PROVENTIL HFA) 108 (90 BASE) MCG/ACT inhaler  Inhale 2 puffs into the lungs every 6 (six) hours as needed. For shortness of breath     .  ARIPiprazole (ABILIFY) 2 MG tablet  Take 2 mg by mouth daily before        Allergies  Allergen Reactions  . Sunflower Oil Anaphylaxis  . Iodine Nausea And Vomiting  . Metoclopramide Hcl Other (See Comments)    Nerves tense up, panic attack  . Metronidazole Other (See Comments)    unknown  . Sulfamethoxazole Nausea And Vomiting    Current Outpatient Prescriptions  Medication Sig Dispense Refill  . albuterol (PROVENTIL HFA) 108 (90 BASE) MCG/ACT inhaler Inhale 2 puffs into the lungs every 6 (six) hours as needed. For shortness of breath      . ARIPiprazole (ABILIFY) 2 MG tablet Take 2 mg by mouth daily before breakfast.       . ascorbic acid (VITAMIN C) 500 MG tablet Take 500 mg by mouth at bedtime.        [name] Besifloxacin HCl (BESIVANCE) 0.6 % SUSP Apply 1 drop to eye 3 (three) times daily. Left eye      . dexlansoprazole (DEXILANT) 60 MG capsule Take 60 mg by mouth daily before breakfast.      . Difluprednate (DUREZOL) 0.05 % EMUL Apply 1 drop to eye 3 (three) times daily. Left eye      . famotidine (PEPCID) 20 MG tablet TAKE 1 TABLET BY MOUTH AT BEDTIME AS NEEDED FOR HEARTBURN  30 tablet  3  . fenofibrate (TRICOR) 145 MG tablet TAKE 1 TABLET BY MOUTH DAILY  30 tablet  2  . furosemide (LASIX) 40 MG tablet Take 40 mg by mouth daily.      [name] gabapentin (NEURONTIN) 300 MG capsule Take 300 mg by mouth 2 (two) times daily.      [name] lisinopril (PRINIVIL,ZESTRIL) 20 MG tablet TAKE 1 TABLET BY MOUTH EVERY DAY  30 tablet  6  . meloxicam (MOBIC) 7.5 MG tablet Take 7.5 mg by mouth 2 (two) times daily.       . metFORMIN (GLUCOPHAGE) 500 MG tablet Take 500 mg by mouth 2 (two) times daily with a meal.      .  Nepafenac (ILEVRO) 0.3 % SUSP Apply 1 drop to eye at bedtime. Left eye      . nitroGLYCERIN (NITROSTAT) 0.4 MG SL tablet Place 0.4 mg under the tongue every 5 (five) minutes as needed.      [name] PARoxetine (PAXIL-CR) 25 MG 24 hr tablet Take 50 mg by mouth at bedtime.       . pindolol (VISKEN) 5 MG tablet Take 5 mg by mouth 2 (two) times daily.       Bertram Gala Glycol-Propyl Glycol (SYSTANE) 0.4-0.3 % GEL Apply 1 drop to eye 2 (two) times daily.       . polyethylene glycol (MIRALAX / GLYCOLAX) packet Take 17 g by mouth daily as needed. For constipation  14 each  6  . pravastatin (PRAVACHOL) 80 MG tablet TAKE 1 TABLET BY MOUTH EVERY DAY  30 tablet  6  . promethazine (PHENERGAN) 25 MG tablet TAKE 1 TABLET BY MOUTH TWICE DAILY AS NEEDED FOR NAUSEA  30 tablet  0  . riboflavin (VITAMIN B-2) 100 MG TABS Take 100 mg by mouth daily.      . solifenacin (VESICARE) 5 MG tablet Take 10 mg by mouth daily.      . Tapentadol HCl (NUCYNTA) 100 MG TABS Take 50 mg by mouth.        No current facility-administered medications  for this visit.    Past Medical History  Diagnosis Date  . Hyperlipidemia   . Benign neoplasm of stomach   . Dizziness and giddiness   . Gastroparesis   . Esophageal reflux   . Chest pain, unspecified   . CAD (coronary artery disease)     a.  cath 2005: LAD 40%;  b. Myoview 3/12: EF 70%, no ischemia;  c. Myoview 9/13: EF 76%, no ischemia  . HTN (hypertension)   . Palpitations   . Depression   . Aortic stenosis     Echo 8/13: EF 55-60%, Mild LVH, mild AS, mean gradient 12 mmHg,   . CHF (congestive heart failure) 06-13-12    episode after Cancer surgery  . PONV (postoperative nausea and vomiting)   . DM2 (diabetes mellitus, type 2) 06-13-12    NIDDM  . Ovarian cancer 06-13-12    surgery only x2-no recent problems  . Chronic hepatitis C without mention of hepatic coma 06-13-12    "states is active"  . Sleep apnea 06-13-12    uses cpap sometimes    Past Surgical History  Procedure Laterality Date  . Cholecystectomy    . Tubal ligation    . Breast biopsy      left  . Abdominal hysterectomy    . Oophorectomy    . Tonsillectomy    . Abdominal hysterectomy    . Bladder suspension  06-13-12    with posterior repair  . Knee arthroscopy      left    Family History  Problem Relation Age of Onset  . Brain cancer Father     spine cancer  . Heart disease Mother   . Breast cancer Sister   . Kidney disease Sister   . Diabetes Brother   . Esophageal cancer Brother   . Diabetes Maternal Aunt   . Diabetes Paternal Aunt   . Stomach cancer Paternal Grandfather     History   Social History  . Marital Status: Widowed    Spouse Name: N/A    Number of Children: 3  . Years of Education: N/A   Occupational History  .  Disabled    Social History Main Topics  . Smoking status: Never Smoker   . Smokeless tobacco: Never Used  . Alcohol Use: No     Comment: past hx. , no current use in 11 yrs.  . Drug Use: Yes    Special: "Crack" cocaine     Comment: last used 2003-crack  cocaine-remains a nonuser  . Sexual Activity: No   Other Topics Concern  . Not on file   Social History Narrative  . No narrative on file    Review of Systems:  All systems reviewed.  They are negative to the above problem except as previously stated.  Vital Signs: BP 113/79  Pulse 91  Ht 5\' 5"  (1.651 m)  Wt 192 lb (87.091 kg)  BMI 31.95 kg/m2  SpO2 92%  Physical Exam Patient is in NAD  With walking oxygen decreases to 87% HEENT:  Normocephalic, atraumatic. EOMI, PERRLA.  Neck: JVP is normal.  No bruits.  Lungs:  Bilateral wheezes Heart: Regular rate and rhythm. Normal S1, S2. No S3.  Gr II/VI systolic murmur PMI not displaced.  Abdomen:  Supple, nontender. Normal bowel sounds. No masses. No hepatomegaly.  Extremities:   Good distal pulses throughout.lower extremity edema.  Musculoskeletal :moving all extremities.  Neuro:   alert and oriented x3.  CN II-XII grossly intact.   Assessment and Plan:  1. Dyspnea.  Patient desaturates with walking  Should be set up for home O2. Continue inhalers I will check labs  I am not convinced this is due to volume and I would recomm that she be seen in pulmonary.  2.  CAD  I am not convinced above symptoms reflect angina.  Follow  3  HTN  Good control.

## 2013-04-18 NOTE — Patient Instructions (Addendum)
**Note De-identified  Obfuscation** Your physician recommends that you continue on your current medications as directed. Please refer to the Current Medication list given to you today.  Your physician recommends that you return for lab work in: today  Your physician recommends that you schedule a follow-up appointment in: to be determined  

## 2013-04-22 ENCOUNTER — Encounter: Payer: Self-pay | Admitting: Emergency Medicine

## 2013-04-22 ENCOUNTER — Ambulatory Visit (INDEPENDENT_AMBULATORY_CARE_PROVIDER_SITE_OTHER): Payer: Medicare Other | Admitting: Emergency Medicine

## 2013-04-22 VITALS — BP 140/86 | HR 84 | Temp 98.0°F | Ht 65.0 in | Wt 197.2 lb

## 2013-04-22 DIAGNOSIS — R0989 Other specified symptoms and signs involving the circulatory and respiratory systems: Secondary | ICD-10-CM

## 2013-04-22 DIAGNOSIS — R0609 Other forms of dyspnea: Secondary | ICD-10-CM

## 2013-04-22 NOTE — Progress Notes (Signed)
Quick Note:  Patient notified. Verbalized agreement with current treatment plan. ______ 

## 2013-04-22 NOTE — Progress Notes (Signed)
Subjective:    Patient ID: CHAVY AVERA, female    DOB: Apr 07, 1951, 62 y.o.   MRN: [id number]  HPI 62 yo woman, never smoker, mild CAD, mild AS, Hep C, HTN, small L PE in 05/2012 completed therapy, OSA not reliable on CPAP, DM, ovarian CA. Followed by Dr Tenny Craw at [facility] and referred for dyspnea and hypoxemia. Of note was seen in ED 04/10/13 for chest pressure and dyspnea > V/q low prob. She has been having dyspnea and difficulty taking a deep breath for about 2 weeks. Never had this problem before. Bothers her when eating, when she wakes up in am while supine - better when she gets up. She describes recent falls - about 3 times in last several months. The last time was 3 weeks ago. No clear trauma to ribs, although she did hit her back and head. Just moved into a camper, not using CPAP for 9-10 months. She has been noted to have desats in the ED and at Cardiology. No wheeze, occas dry cough (on lisinopril). She has albuterol - may help her some.    Review of Systems  Constitutional: Positive for unexpected weight change. Negative for fever.  HENT: Positive for congestion and trouble swallowing. Negative for ear pain, nosebleeds, sore throat, rhinorrhea, sneezing, dental problem, postnasal drip and sinus pressure.   Eyes: Negative for redness and itching.  Respiratory: Positive for cough and shortness of breath. Negative for chest tightness and wheezing.   Cardiovascular: Positive for leg swelling. Negative for palpitations.  Gastrointestinal: Negative for nausea and vomiting.  Genitourinary: Negative for dysuria.  Musculoskeletal: Negative for joint swelling.  Skin: Negative for rash.  Neurological: Positive for dizziness and light-headedness. Negative for headaches.  Hematological: Does not bruise/bleed easily.  Psychiatric/Behavioral: Positive for dysphoric mood. The patient is not nervous/anxious.     Past Medical History  Diagnosis Date  . Hyperlipidemia   . Benign neoplasm of  stomach   . Dizziness and giddiness   . Gastroparesis   . Esophageal reflux   . Chest pain, unspecified   . CAD (coronary artery disease)     a.  cath 2005: LAD 40%;  b. Myoview 3/12: EF 70%, no ischemia;  c. Myoview 9/13: EF 76%, no ischemia  . HTN (hypertension)   . Palpitations   . Depression   . Aortic stenosis     Echo 8/13: EF 55-60%, Mild LVH, mild AS, mean gradient 12 mmHg,   . CHF (congestive heart failure) 06-13-12    episode after Cancer surgery  . PONV (postoperative nausea and vomiting)   . DM2 (diabetes mellitus, type 2) 06-13-12    NIDDM  . Ovarian cancer 06-13-12    surgery only x2-no recent problems  . Chronic hepatitis C without mention of hepatic coma 06-13-12    "states is active"  . Sleep apnea 06-13-12    uses cpap sometimes     Family History  Problem Relation Age of Onset  . Brain cancer Father     spine cancer  . Heart disease Mother   . Breast cancer Sister   . Kidney disease Sister   . Diabetes Brother   . Esophageal cancer Brother   . Diabetes Maternal Aunt   . Diabetes Paternal Aunt   . Stomach cancer Paternal Grandfather      History   Social History  . Marital Status: Widowed    Spouse Name: N/A    Number of Children: 3  . Years of Education: N/A  Occupational History  . Disabled     memory loss   Social History Main Topics  . Smoking status: Never Smoker   . Smokeless tobacco: Never Used  . Alcohol Use: No     Comment: past hx. , no current use in 11 yrs.  . Drug Use: Yes    Special: "Crack" cocaine     Comment: last used 2003-crack cocaine-remains a nonuser  . Sexual Activity: No   Other Topics Concern  . Not on file   Social History Narrative  . No narrative on file     Allergies  Allergen Reactions  . Sunflower Oil Anaphylaxis  . Iodine Nausea And Vomiting  . Metoclopramide Hcl Other (See Comments)    Nerves tense up, panic attack  . Metronidazole Other (See Comments)    unknown  . Sulfamethoxazole Nausea  And Vomiting     Outpatient Prescriptions Prior to Visit  Medication Sig Dispense Refill  . albuterol (PROVENTIL HFA) 108 (90 BASE) MCG/ACT inhaler Inhale 2 puffs into the lungs every 6 (six) hours as needed. For shortness of breath      . ARIPiprazole (ABILIFY) 2 MG tablet Take 2 mg by mouth daily before breakfast.       . ascorbic acid (VITAMIN C) 500 MG tablet Take 500 mg by mouth at bedtime.       [name] Besifloxacin HCl (BESIVANCE) 0.6 % SUSP Apply 1 drop to eye 3 (three) times daily. Left eye      . dexlansoprazole (DEXILANT) 60 MG capsule Take 60 mg by mouth daily before breakfast.      . Difluprednate (DUREZOL) 0.05 % EMUL Apply 1 drop to eye 3 (three) times daily. Left eye      . famotidine (PEPCID) 20 MG tablet TAKE 1 TABLET BY MOUTH AT BEDTIME AS NEEDED FOR HEARTBURN  30 tablet  3  . fenofibrate (TRICOR) 145 MG tablet TAKE 1 TABLET BY MOUTH DAILY  30 tablet  2  . furosemide (LASIX) 40 MG tablet Take 40 mg by mouth daily.      [name] gabapentin (NEURONTIN) 300 MG capsule Take 300 mg by mouth 2 (two) times daily.      [name] lisinopril (PRINIVIL,ZESTRIL) 20 MG tablet TAKE 1 TABLET BY MOUTH EVERY DAY  30 tablet  6  . meloxicam (MOBIC) 7.5 MG tablet Take 7.5 mg by mouth 2 (two) times daily.       . metFORMIN (GLUCOPHAGE) 500 MG tablet Take 500 mg by mouth 2 (two) times daily with a meal.      . Nepafenac (ILEVRO) 0.3 % SUSP Apply 1 drop to eye at bedtime. Left eye      . nitroGLYCERIN (NITROSTAT) 0.4 MG SL tablet Place 0.4 mg under the tongue every 5 (five) minutes as needed.      [name] PARoxetine (PAXIL-CR) 25 MG 24 hr tablet Take 50 mg by mouth at bedtime.       . pindolol (VISKEN) 5 MG tablet Take 5 mg by mouth 2 (two) times daily.       Bertram Gala Glycol-Propyl Glycol (SYSTANE) 0.4-0.3 % GEL Apply 1 drop to eye 2 (two) times daily.       . polyethylene glycol (MIRALAX / GLYCOLAX) packet Take 17 g by mouth daily as needed. For constipation  14 each  6  . pravastatin (PRAVACHOL) 80 MG tablet TAKE 1  TABLET BY MOUTH EVERY DAY  30 tablet  6  . promethazine (PHENERGAN) 25 MG tablet TAKE 1 TABLET  BY MOUTH TWICE DAILY AS NEEDED FOR NAUSEA  30 tablet  0  . riboflavin (VITAMIN B-2) 100 MG TABS Take 100 mg by mouth daily.      . solifenacin (VESICARE) 5 MG tablet Take 10 mg by mouth daily.      . Tapentadol HCl (NUCYNTA) 100 MG TABS Take 50 mg by mouth.        No facility-administered medications prior to visit.   06/21/12 --  Comparison: Chest x-ray of 05/24/2012  Findings: On the second contrast injection the pulmonary arteries  are moderately well opacified. The only questionable abnormality  is a tiny filling defect within a sub segmental branch of the  pulmonary artery to the left upper lobe. A very small pulmonary  embolism in this subsegmental branch cannot be excluded. No other  filling defect is seen throughout the pulmonary arterial system.  The thoracic aorta opacifies with no acute abnormality. The  origins of the great vessels are patent. No mediastinal or hilar  adenopathy is seen. Imaging through the upper abdomen, there is  diffuse fatty infiltration of the liver and the liver appears  enlarged. Surgical clips are present from prior cholecystectomy.  On the lung window images, no lung parenchymal lesion is seen. No  infiltrate is noted. No pleural effusion is noted. Incidental  calcified splenic granuloma are present from prior granulomatous  disease. No bony abnormality is seen.  IMPRESSION:  1. Tiny pulmonary embolus within a subsegmental branch of the left  upper lobe pulmonary artery. No other filling defect is seen.  2. No infiltrate, effusion, or nodule.  3. Diffuse fatty infiltration of the liver. Suspect hepatomegaly  4. Calcified splenic granulomas from prior granulomatous disease.       Objective:   Physical Exam Filed Vitals:   04/22/13 1520  BP: 140/86  Pulse: 84  Temp: 98 F (36.7 C)  TempSrc: Oral  Height: 5\' 5"  (1.651 m)  Weight: 197 lb 3.2 oz  (89.449 kg)  SpO2: 94%   Gen: Pleasant, overwt, in no distress, reserved affect  ENT: No lesions,  mouth clear,  oropharynx clear, no postnasal drip  Neck: No JVD, no TMG, no carotid bruits, no stridor  Lungs: No use of accessory muscles, no dullness to percussion, clear without rales or rhonchi  Cardiovascular: RRR, heart sounds normal, no murmur or gallops, no peripheral edema  Musculoskeletal: No deformities, no cyanosis or clubbing  Neuro: alert, non focal  Skin: Warm, no lesions or rashes     Assessment & Plan:  DYSPNEA ON EXERTION Based on her description, suspect that this is restrictive lung disease. Consider possible evolving secondary PAH given her inability to wear her CPAP. Need to r/o AFL. She has hx PE, but recent V/q was reassuring. No ILD on CT scan from 2013.  - full PFT - walking oximetry today - need to get her back on CPAP. Her living situation is the biggest barrier to this right now. Discussed the importance of this w her.

## 2013-04-22 NOTE — Patient Instructions (Addendum)
Walking oximetry today showed you need 2L o2 with exertion-- we will order the most portable oxygen system for you We will perform Full Pulmonary Function Testing We need to work on a way to restart your CPAP. You will likely need to adjust your sleep location to allow you to use the mask.  Follow with Dr Delton Coombes next available with Full PFT

## 2013-04-22 NOTE — Assessment & Plan Note (Addendum)
Based on her description, suspect that this is restrictive lung disease due to OSA/OHS. Consider possible evolving secondary PAH given her inability to wear her CPAP. Need to r/o AFL. She has hx PE, but recent V/q was reassuring. No ILD on CT scan from 2013.  - full PFT - walking oximetry today - need to get her back on CPAP. Her living situation is the biggest barrier to this right now. Discussed the importance of this w her.

## 2013-04-24 ENCOUNTER — Telehealth: Payer: Self-pay | Admitting: Emergency Medicine

## 2013-04-24 DIAGNOSIS — G4733 Obstructive sleep apnea (adult) (pediatric): Secondary | ICD-10-CM

## 2013-04-24 NOTE — Telephone Encounter (Signed)
Please order most portable system possible. If this requires a regulator, then order it (I ordered the most portable system available the first time, but for some reason they ignore this).

## 2013-04-24 NOTE — Telephone Encounter (Signed)
Spoke with the pt She is c/o her o2 tank [facility] brought is too heavy for her and they need order for "regulator" to fix this  She also states that they need order to have o2 bled into CPAP  Please advise if this is okay-looks like she only needs o2 with exertion? Thanks!

## 2013-04-24 NOTE — Telephone Encounter (Signed)
LMTCB

## 2013-04-25 NOTE — Telephone Encounter (Signed)
I would need her PSG to figure that out. To be accurate please order an ONO on CPAP + RA. If she desats then we will order O2.

## 2013-04-25 NOTE — Telephone Encounter (Signed)
I called and spoke with pt and made her aware. Nothing further needed

## 2013-04-25 NOTE — Telephone Encounter (Signed)
I spoke with pt. She is wanting to know if she needs to wear 02 with her CPAP or without and how many liters? Please advise Dr. Delton Coombes. thanks

## 2013-04-25 NOTE — Telephone Encounter (Signed)
LMTCBx2. What about CPAP? Does the pt have a machine? Carron Curie, CMA

## 2013-04-25 NOTE — Telephone Encounter (Signed)
Pt returned call. [name] [name] °

## 2013-05-09 ENCOUNTER — Other Ambulatory Visit: Payer: Self-pay | Admitting: Gastroenterology

## 2013-05-13 ENCOUNTER — Institutional Professional Consult (permissible substitution): Payer: Medicare Other | Admitting: Emergency Medicine

## 2013-05-15 ENCOUNTER — Ambulatory Visit (HOSPITAL_COMMUNITY)
Admission: RE | Admit: 2013-05-15 | Discharge: 2013-05-15 | Disposition: A | Discharge status: Home / Self Care | Payer: Medicare Other | Source: Ambulatory Visit | Attending: Emergency Medicine | Admitting: Emergency Medicine

## 2013-05-15 ENCOUNTER — Ambulatory Visit (INDEPENDENT_AMBULATORY_CARE_PROVIDER_SITE_OTHER): Payer: Medicare Other | Admitting: Emergency Medicine

## 2013-05-15 ENCOUNTER — Encounter: Payer: Self-pay | Admitting: Emergency Medicine

## 2013-05-15 VITALS — BP 100/70 | HR 88 | Ht 65.0 in | Wt 191.8 lb

## 2013-05-15 DIAGNOSIS — G4733 Obstructive sleep apnea (adult) (pediatric): Secondary | ICD-10-CM

## 2013-05-15 DIAGNOSIS — J988 Other specified respiratory disorders: Secondary | ICD-10-CM | POA: Insufficient documentation

## 2013-05-15 DIAGNOSIS — R0989 Other specified symptoms and signs involving the circulatory and respiratory systems: Secondary | ICD-10-CM | POA: Insufficient documentation

## 2013-05-15 DIAGNOSIS — R0609 Other forms of dyspnea: Secondary | ICD-10-CM | POA: Insufficient documentation

## 2013-05-15 MED ORDER — ALBUTEROL SULFATE (5 MG/ML) 0.5% IN NEBU
2.5000 mg | INHALATION_SOLUTION | Freq: Once | RESPIRATORY_TRACT | Status: AC
  Administered 2013-05-15: 2.5 mg via RESPIRATORY_TRACT

## 2013-05-15 NOTE — Patient Instructions (Addendum)
We will add oxygen to your CPAP mask at night. Please start using this every night Wear your oxygen as you have been doing Work on diet, exercise and conditioning Follow with Dr Delton Coombes in 6 months or sooner if you have any problems

## 2013-05-15 NOTE — Assessment & Plan Note (Signed)
PFT show restriction, suspect due to her wt, some contribution of narcs. No evidence for ILD. She also c/o nasal obstruction, especially on the L. She has deviated septum after fx's.

## 2013-05-15 NOTE — Assessment & Plan Note (Signed)
Need to restart CPAP, add O2 at 2L/min

## 2013-05-15 NOTE — Progress Notes (Signed)
Subjective:    Patient ID: [name] [name], female    DOB: 1951/08/21, 62 y.o.   MRN: [id number]  HPI 62 yo woman, never smoker, mild CAD, mild AS, Hep C, HTN, small L PE in 05/2012 completed therapy, OSA not reliable on CPAP, DM, ovarian CA. Followed by Dr Tenny Craw at [facility] and referred for dyspnea and hypoxemia. Of note was seen in ED 04/10/13 for chest pressure and dyspnea > V/q low prob. She has been having dyspnea and difficulty taking a deep breath for about 2 weeks. Never had this problem before. Bothers her when eating, when she wakes up in am while supine - better when she gets up. She describes recent falls - about 3 times in last several months. The last time was 3 weeks ago. No clear trauma to ribs, although she did hit her back and head. Just moved into a camper, not using CPAP for 9-10 months. She has been noted to have desats in the ED and at Cardiology. No wheeze, occas dry cough (on lisinopril). She has albuterol - may help her some.   ROV 05/15/13 -- follows up for dyspnea and hypoxemia. She returns for f/u. She has not restarted CPAP, although she did wear it for one night to get her ONO. We performed ONO on CPAP that showed desat < 88% for 20% of the night. PFT performed > show restriction and decreased DLCO. TTE from 2013 showed normal RV size and fxn.     Review of Systems  Constitutional: Positive for unexpected weight change. Negative for fever.  HENT: Positive for congestion and trouble swallowing. Negative for ear pain, nosebleeds, sore throat, rhinorrhea, sneezing, dental problem, postnasal drip and sinus pressure.   Eyes: Negative for redness and itching.  Respiratory: Positive for cough and shortness of breath. Negative for chest tightness and wheezing.   Cardiovascular: Positive for leg swelling. Negative for palpitations.  Gastrointestinal: Negative for nausea and vomiting.  Genitourinary: Negative for dysuria.  Musculoskeletal: Negative for joint swelling.  Skin:  Negative for rash.  Neurological: Positive for dizziness and light-headedness. Negative for headaches.  Hematological: Does not bruise/bleed easily.  Psychiatric/Behavioral: Positive for dysphoric mood. The patient is not nervous/anxious.     06/21/12 --  Comparison: Chest x-ray of 05/24/2012  Findings: On the second contrast injection the pulmonary arteries  are moderately well opacified. The only questionable abnormality  is a tiny filling defect within a sub segmental branch of the  pulmonary artery to the left upper lobe. A very small pulmonary  embolism in this subsegmental branch cannot be excluded. No other  filling defect is seen throughout the pulmonary arterial system.  The thoracic aorta opacifies with no acute abnormality. The  origins of the great vessels are patent. No mediastinal or hilar  adenopathy is seen. Imaging through the upper abdomen, there is  diffuse fatty infiltration of the liver and the liver appears  enlarged. Surgical clips are present from prior cholecystectomy.  On the lung window images, no lung parenchymal lesion is seen. No  infiltrate is noted. No pleural effusion is noted. Incidental  calcified splenic granuloma are present from prior granulomatous  disease. No bony abnormality is seen.  IMPRESSION:  1. Tiny pulmonary embolus within a subsegmental branch of the left  upper lobe pulmonary artery. No other filling defect is seen.  2. No infiltrate, effusion, or nodule.  3. Diffuse fatty infiltration of the liver. Suspect hepatomegaly  4. Calcified splenic granulomas from prior granulomatous disease.  Objective:   Physical Exam Filed Vitals:   05/15/13 1527  BP: 100/70  Pulse: 88  Height: 5\' 5"  (1.651 m)  Weight: 191 lb 12.8 oz (87 kg)  SpO2: 97%   Gen: Pleasant, overwt, in no distress, reserved affect  ENT: No lesions,  mouth clear,  oropharynx clear, no postnasal drip  Neck: No JVD, no TMG, no carotid bruits, no  stridor  Lungs: No use of accessory muscles, no dullness to percussion, clear without rales or rhonchi  Cardiovascular: RRR, heart sounds normal, no murmur or gallops, no peripheral edema  Musculoskeletal: No deformities, no cyanosis or clubbing  Neuro: alert, non focal  Skin: Warm, no lesions or rashes     Assessment & Plan:  DYSPNEA ON EXERTION PFT show restriction, suspect due to her wt, some contribution of narcs. No evidence for ILD. She also c/o nasal obstruction, especially on the L. She has deviated septum after fx's.   Obstructive sleep apnea Need to restart CPAP, add O2 at 2L/min

## 2013-05-16 ENCOUNTER — Ambulatory Visit: Payer: Medicare Other | Admitting: Internal Medicine

## 2013-06-12 ENCOUNTER — Other Ambulatory Visit: Payer: Self-pay | Admitting: Gastroenterology

## 2013-06-19 ENCOUNTER — Ambulatory Visit: Payer: Medicare Other | Admitting: Gastroenterology

## 2013-06-27 ENCOUNTER — Other Ambulatory Visit: Payer: Self-pay | Admitting: Physician Assistant

## 2013-07-03 ENCOUNTER — Other Ambulatory Visit: Payer: Self-pay | Admitting: Gastroenterology

## 2013-07-08 ENCOUNTER — Other Ambulatory Visit: Payer: Self-pay | Admitting: Gastroenterology

## 2013-07-10 ENCOUNTER — Encounter: Payer: Self-pay | Admitting: Gastroenterology

## 2013-07-10 ENCOUNTER — Ambulatory Visit (INDEPENDENT_AMBULATORY_CARE_PROVIDER_SITE_OTHER): Payer: Medicare Other | Admitting: Gastroenterology

## 2013-07-10 VITALS — BP 102/62 | HR 88 | Ht 65.0 in | Wt 193.1 lb

## 2013-07-10 DIAGNOSIS — Z1211 Encounter for screening for malignant neoplasm of colon: Secondary | ICD-10-CM

## 2013-07-10 DIAGNOSIS — K219 Gastro-esophageal reflux disease without esophagitis: Secondary | ICD-10-CM

## 2013-07-10 DIAGNOSIS — K3184 Gastroparesis: Secondary | ICD-10-CM

## 2013-07-10 DIAGNOSIS — R197 Diarrhea, unspecified: Secondary | ICD-10-CM

## 2013-07-10 MED ORDER — NA SULFATE-K SULFATE-MG SULF 17.5-3.13-1.6 GM/177ML PO SOLN: 1.0000 | Freq: Once | ORAL | Status: DC

## 2013-07-10 NOTE — Assessment & Plan Note (Addendum)
Diarrhea may be related to bacterial overgrowth.  IBS is a consideration as well.  Recommendations #1 colonoscopy.  If no abnormalities are seen will take random biopsies to rule out microscopic colitis. #2 to consider empiric trial of antibiotics for bacterial overgrowth pending results of #1

## 2013-07-10 NOTE — Patient Instructions (Signed)
You have been scheduled for a colonoscopy with propofol at [facility].  Please follow written instructions given to you at your visit today.  Please pick up your prep kit at the pharmacy within the next 1-3 days. If you use inhalers (even only as needed), please bring them with you on the day of your procedure. Your physician has requested that you go to www.startemmi.com and enter the access code given to you at your visit today. This web site gives a general overview about your procedure. However, you should still follow specific instructions given to you by our office regarding your preparation for the procedure.

## 2013-07-10 NOTE — Assessment & Plan Note (Signed)
Patient will consider enrollment in a gastroparesis trial 

## 2013-07-10 NOTE — Assessment & Plan Note (Signed)
She remains symptomatically from reflux despite dexilant.  I suspect this is exacerbated by gastroparesis.  Recommendations #1 patient will be referred to GI research for consideration of a gastroparesis trial

## 2013-07-10 NOTE — Progress Notes (Signed)
History of Present Illness:  Patient has returned for reevaluation of nausea, bloating, abdominal discomfort and diarrhea.  She has well-established gastroparesis.  She's complaining of severe pyrosis with regurgitation of gastric contents despite taking dexilant.  She was intolerant of metoclopramide.  She has nausea and bloating.  The patient also complains of frequent diarrhea at least several days a week with urgency and occasional incontinence.  Last colonoscopy was 2005.  There is no history of bleeding.    Review of Systems: Pertinent positive and negative review of systems were noted in the above HPI section. All other review of systems were otherwise negative.    Current Medications, Allergies, Past Medical History, Past Surgical History, Family History and Social History were reviewed in [employer] electronic medical record  Vital signs were reviewed in today's medical record. Physical Exam: General: Well developed , well nourished, no acute distress Skin: anicteric Head: Normocephalic and atraumatic Eyes:  sclerae anicteric, EOMI Ears: Normal auditory acuity Mouth: No deformity or lesions Lungs: Clear throughout to auscultation Heart: Regular rate and rhythm; no murmurs, rubs or bruits Abdomen: Soft, non tender and non distended. No masses, hepatosplenomegaly or hernias noted. Normal Bowel sounds.  There is no succussion splash Rectal:deferred Musculoskeletal: Symmetrical with no gross deformities  Pulses:  Normal pulses noted Extremities: No clubbing, cyanosis, edema or deformities noted Neurological: Alert oriented x 4, grossly nonfocal Psychological:  Alert and cooperative. Normal mood and affect

## 2013-07-12 ENCOUNTER — Other Ambulatory Visit: Payer: Self-pay | Admitting: Internal Medicine

## 2013-07-18 ENCOUNTER — Encounter (HOSPITAL_COMMUNITY): Payer: Self-pay | Admitting: *Deleted

## 2013-07-25 ENCOUNTER — Other Ambulatory Visit: Payer: Self-pay | Admitting: Internal Medicine

## 2013-07-28 ENCOUNTER — Encounter (HOSPITAL_COMMUNITY): Payer: Self-pay | Admitting: Pharmacy Technician

## 2013-08-11 ENCOUNTER — Ambulatory Visit (HOSPITAL_COMMUNITY): Payer: Medicare Other | Admitting: Anesthesiology

## 2013-08-11 ENCOUNTER — Encounter (HOSPITAL_COMMUNITY): Payer: Medicare Other | Admitting: Anesthesiology

## 2013-08-11 ENCOUNTER — Encounter (HOSPITAL_COMMUNITY)
Admission: RE | Disposition: A | Discharge status: Home / Self Care | Payer: Self-pay | Source: Ambulatory Visit | Attending: Gastroenterology

## 2013-08-11 ENCOUNTER — Encounter (HOSPITAL_COMMUNITY): Payer: Self-pay

## 2013-08-11 ENCOUNTER — Ambulatory Visit (HOSPITAL_COMMUNITY)
Admission: RE | Admit: 2013-08-11 | Discharge: 2013-08-11 | Disposition: A | Discharge status: Home / Self Care | Payer: Medicare Other | Source: Ambulatory Visit | Attending: Gastroenterology | Admitting: Gastroenterology

## 2013-08-11 DIAGNOSIS — R109 Unspecified abdominal pain: Secondary | ICD-10-CM | POA: Insufficient documentation

## 2013-08-11 DIAGNOSIS — E119 Type 2 diabetes mellitus without complications: Secondary | ICD-10-CM | POA: Insufficient documentation

## 2013-08-11 DIAGNOSIS — I2510 Atherosclerotic heart disease of native coronary artery without angina pectoris: Secondary | ICD-10-CM | POA: Insufficient documentation

## 2013-08-11 DIAGNOSIS — G4730 Sleep apnea, unspecified: Secondary | ICD-10-CM | POA: Insufficient documentation

## 2013-08-11 DIAGNOSIS — K219 Gastro-esophageal reflux disease without esophagitis: Secondary | ICD-10-CM

## 2013-08-11 DIAGNOSIS — K3184 Gastroparesis: Secondary | ICD-10-CM | POA: Insufficient documentation

## 2013-08-11 DIAGNOSIS — R141 Gas pain: Secondary | ICD-10-CM | POA: Insufficient documentation

## 2013-08-11 DIAGNOSIS — R197 Diarrhea, unspecified: Secondary | ICD-10-CM | POA: Insufficient documentation

## 2013-08-11 DIAGNOSIS — Z1211 Encounter for screening for malignant neoplasm of colon: Secondary | ICD-10-CM

## 2013-08-11 DIAGNOSIS — I739 Peripheral vascular disease, unspecified: Secondary | ICD-10-CM | POA: Insufficient documentation

## 2013-08-11 DIAGNOSIS — I10 Essential (primary) hypertension: Secondary | ICD-10-CM | POA: Insufficient documentation

## 2013-08-11 DIAGNOSIS — R142 Eructation: Secondary | ICD-10-CM | POA: Insufficient documentation

## 2013-08-11 HISTORY — PX: COLONOSCOPY: SHX5424

## 2013-08-11 LAB — BASIC METABOLIC PANEL
BUN: 12 mg/dL (ref 6–23)
Chloride: 105 mEq/L (ref 96–112)
Creatinine, Ser: 0.88 mg/dL (ref 0.50–1.10)
GFR calc Af Amer: 81 mL/min — ABNORMAL LOW (ref >90)
Glucose, Bld: 199 mg/dL — ABNORMAL HIGH (ref 70–99)
Potassium: 4.2 mEq/L (ref 3.5–5.1)

## 2013-08-11 SURGERY — COLONOSCOPY
Anesthesia: Monitor Anesthesia Care

## 2013-08-11 MED ORDER — HYOSCYAMINE SULFATE 0.125 MG SL SUBL
0.2500 mg | SUBLINGUAL_TABLET | Freq: Once | SUBLINGUAL | Status: DC
  Filled 2013-08-11: qty 2

## 2013-08-11 MED ORDER — ONDANSETRON HCL 4 MG/2ML IJ SOLN
4.0000 mg | Freq: Once | INTRAMUSCULAR | Status: AC
  Administered 2013-08-11: 4 mg via INTRAVENOUS

## 2013-08-11 MED ORDER — PROPOFOL 10 MG/ML IV BOLUS
INTRAVENOUS | Status: AC
  Filled 2013-08-11: qty 20

## 2013-08-11 MED ORDER — ONDANSETRON HCL 4 MG/2ML IJ SOLN
INTRAMUSCULAR | Status: AC
  Filled 2013-08-11: qty 2

## 2013-08-11 MED ORDER — SODIUM CHLORIDE 0.9 % IV SOLN: INTRAVENOUS | Status: DC

## 2013-08-11 MED ORDER — MIDAZOLAM HCL 5 MG/5ML IJ SOLN
INTRAMUSCULAR | Status: DC | PRN
  Administered 2013-08-11: 2 mg via INTRAVENOUS

## 2013-08-11 MED ORDER — PROPOFOL 10 MG/ML IV BOLUS
INTRAVENOUS | Status: DC | PRN
  Administered 2013-08-11: 50 mg via INTRAVENOUS

## 2013-08-11 MED ORDER — PROPOFOL INFUSION 10 MG/ML OPTIME
INTRAVENOUS | Status: DC | PRN
  Administered 2013-08-11: 160 ug/kg/min via INTRAVENOUS

## 2013-08-11 MED ORDER — MIDAZOLAM HCL 2 MG/2ML IJ SOLN
INTRAMUSCULAR | Status: AC
  Filled 2013-08-11: qty 2

## 2013-08-11 MED ORDER — LACTATED RINGERS IV SOLN
INTRAVENOUS | Status: DC
  Administered 2013-08-11: 1000 mL via INTRAVENOUS
  Administered 2013-08-11: 10:00:00 via INTRAVENOUS

## 2013-08-11 NOTE — Anesthesia Preprocedure Evaluation (Addendum)
Anesthesia Evaluation  Patient identified by MRN, date of birth, ID band Patient awake    Reviewed: Allergy & Precautions, H&P , NPO status , Patient's Chart, lab work & pertinent test results  History of Anesthesia Complications (+) PONV and history of anesthetic complications  Airway Mallampati: II TM Distance: >3 FB Neck ROM: Full    Dental  (+) Upper Dentures   Pulmonary shortness of breath, with exertion and Long-Term Oxygen Therapy, sleep apnea, Continuous Positive Airway Pressure Ventilation and Oxygen sleep apnea ,  breath sounds clear to auscultation  Pulmonary exam normal       Cardiovascular hypertension, Pt. on home beta blockers + CAD, + Peripheral Vascular Disease and +CHF + Valvular Problems/Murmurs AS Rhythm:Regular Rate:Normal  Carotid artery stenosis, without infarction.  Saw Dr. Tenny Craw August 2014, visit reviewed. Saw Dr. Delton Coombes September 2014, visit reviewed.   Neuro/Psych PSYCHIATRIC DISORDERS Depression negative neurological ROS     GI/Hepatic GERD-  Medicated,(+)     substance abuse  cocaine use, Hepatitis -, C  Endo/Other  diabetes, Type 2, Oral Hypoglycemic Agents  Renal/GU negative Renal ROS  negative genitourinary   Musculoskeletal negative musculoskeletal ROS (+)   Abdominal   Peds negative pediatric ROS (+)  Hematology negative hematology ROS (+)   Anesthesia Other Findings   Reproductive/Obstetrics negative OB ROS                        Anesthesia Physical Anesthesia Plan  ASA: III  Anesthesia Plan: MAC   Post-op Pain Management:    Induction: Intravenous  Airway Management Planned:   Additional Equipment:   Intra-op Plan:   Post-operative Plan:   Informed Consent: I have reviewed the patients History and Physical, chart, labs and discussed the procedure including the risks, benefits and alternatives for the proposed anesthesia with the patient or  authorized representative who has indicated his/her understanding and acceptance.   Dental advisory given  Plan Discussed with: CRNA  Anesthesia Plan Comments:         Anesthesia Quick Evaluation

## 2013-08-11 NOTE — Consult Note (Signed)
  History of Present Illness:  Patient has returned for reevaluation of nausea, bloating, abdominal discomfort and diarrhea.  She has well-established gastroparesis.  She's complaining of severe pyrosis with regurgitation of gastric contents despite taking dexilant.  She was intolerant of metoclopramide.  She has nausea and bloating.  The patient also complains of frequent diarrhea at least several days a week with urgency and occasional incontinence.  Last colonoscopy was 2005.  There is no history of bleeding.       Review of Systems: Pertinent positive and negative review of systems were noted in the above HPI section. All other review of systems were otherwise negative.       Current Medications, Allergies, Past Medical History, Past Surgical History, Family History and Social History were reviewed in [employer] electronic medical record   Vital signs were reviewed in today's medical record. Physical Exam: General: Well developed , well nourished, no acute distress Skin: anicteric Head: Normocephalic and atraumatic Eyes:  sclerae anicteric, EOMI Ears: Normal auditory acuity Mouth: No deformity or lesions Lungs: Clear throughout to auscultation Heart: Regular rate and rhythm; no murmurs, rubs or bruits Abdomen: Soft, non tender and non distended. No masses, hepatosplenomegaly or hernias noted. Normal Bowel sounds.  There is no succussion splash Rectal:deferred Musculoskeletal: Symmetrical with no gross deformities   Pulses:  Normal pulses noted Extremities: No clubbing, cyanosis, edema or deformities noted Neurological: Alert oriented x 4, grossly nonfocal Psychological:  Alert and cooperative. Normal mood and affect                Esophageal reflux - Louis Meckel, MD at 07/10/2013 10:30 AM      Status: Written Related Problem: Esophageal reflux    She remains symptomatically from reflux despite dexilant.  I suspect this is exacerbated by  gastroparesis.   Recommendations #1 patient will be referred to GI research for consideration of a gastroparesis trial         Gastroparesis - Louis Meckel, MD at 07/10/2013 10:31 AM      Status: Written Related Problem: Gastroparesis    Patient will consider enrollment in a gastroparesis trial         Diarrhea - Louis Meckel, MD at 07/10/2013 10:32 AM      Status: Linus Orn Related Problem: Diarrhea    Diarrhea may be related to bacterial overgrowth.  IBS is a consideration as well.   Recommendations #1 colonoscopy.  If no abnormalities are seen will take random biopsies to rule out microscopic colitis. #2 to consider empiric trial

## 2013-08-11 NOTE — Op Note (Signed)
[facility] [address] [place], [address]   COLONOSCOPY PROCEDURE REPORT  PATIENT: [name], [name]  MR#: [id number] BIRTHDATE: [birth year] , 62  yrs. old GENDER: Female ENDOSCOPIST: Louis Meckel, MD REFERRED BY: PROCEDURE DATE:  08/11/2013 PROCEDURE:   Colonoscopy with biopsy ASA CLASS:   Class II INDICATIONS:Unexplained diarrhea. MEDICATIONS: MAC sedation, administered by CRNA  DESCRIPTION OF PROCEDURE:   After the risks benefits and alternatives of the procedure were thoroughly explained, informed consent was obtained.  A digital rectal exam revealed no abnormalities of the rectum.   The Pentax Adult Colonscope [id number] endoscope was introduced through the anus and advanced to the cecum, which was identified by both the appendix and ileocecal valve. the exam was difficult because of severe looping. Ultimately, the cecum was reached after placing the patient on her right side. No adverse events experienced.   The quality of the prep was Suprep good  The instrument was then slowly withdrawn as the colon was fully examined.      COLON FINDINGS: A normal appearing cecum, ileocecal valve, and appendiceal orifice were identified.  The ascending, hepatic flexure, transverse, splenic flexure, descending, sigmoid colon and rectum appeared unremarkable.  No polyps or cancers were seen. Retroflexed views revealed no abnormalities. Random biopsies were taken throughout the colon to rule out microscopic Gladys.The time to cecum=36 minutes 0 seconds.  Withdrawal time=7 minutes 0 seconds.  The scope was withdrawn and the procedure completed. COMPLICATIONS: There were no complications.  ENDOSCOPIC IMPRESSION: Normal colon  RECOMMENDATIONS: 1.  Await biopsy results 2.  Colonoscopy 10 years   eSigned:  Louis Meckel, MD 08/11/2013 11:21 AM   cc:  Cherylann Ratel, FNP   PATIENT NAME:  [name], [name] MR#: [id number]

## 2013-08-11 NOTE — Anesthesia Postprocedure Evaluation (Signed)
  Anesthesia Post-op Note  Patient: [name] [name]  Procedure(s) Performed: Procedure(s) (LRB): COLONOSCOPY (N/A)  Patient Location: PACU  Anesthesia Type: MAC  Level of Consciousness: awake and alert   Airway and Oxygen Therapy: Patient Spontanous Breathing  Post-op Pain: mild  Post-op Assessment: Post-op Vital signs reviewed, Patient's Cardiovascular Status Stable, Respiratory Function Stable, Patent Airway and No signs of Nausea or vomiting  Last Vitals:  Filed Vitals:   08/11/13 1214  BP: 127/64  Pulse: 95  Temp:   Resp: 16    Post-op Vital Signs: stable   Complications: No apparent anesthesia complications

## 2013-08-11 NOTE — Transfer of Care (Signed)
Immediate Anesthesia Transfer of Care Note  Patient: [name] [name]  Procedure(s) Performed: Procedure(s): COLONOSCOPY (N/A)  Patient Location: PACU and Endoscopy Unit  Anesthesia Type:MAC  Level of Consciousness: awake and alert   Airway & Oxygen Therapy: Patient Spontanous Breathing  Post-op Assessment: Report given to PACU RN and Post -op Vital signs reviewed and stable  Post vital signs: Reviewed and stable  Complications: No apparent anesthesia complications

## 2013-08-11 NOTE — Progress Notes (Addendum)
Pt c./o l;ower abd pain a 7/10. MD made aware. Son was curious why his mother needs to wear oxygen. Informed pt to ask her primary MD. Pt is tolerating po intake.Son remains at bedside.MD came to evaluate the pt.Pt given .25mg  of levsin SL. For abd cramping/

## 2013-08-12 ENCOUNTER — Encounter (HOSPITAL_COMMUNITY): Payer: Self-pay | Admitting: Gastroenterology

## 2013-08-14 ENCOUNTER — Other Ambulatory Visit: Payer: Self-pay | Admitting: Gastroenterology

## 2013-08-18 ENCOUNTER — Encounter: Payer: Self-pay | Admitting: Gastroenterology

## 2013-09-01 ENCOUNTER — Telehealth: Payer: Self-pay | Admitting: Neurology

## 2013-09-01 NOTE — Telephone Encounter (Signed)
Calling because she states pain mgmt told her to check with Dr. Brett Fairy to see if there was anything that can be done for her pain

## 2013-09-02 NOTE — Telephone Encounter (Signed)
That why she was send to pain management , to treat and evaluate . If i had the answer, i wouldn't need to refer her?

## 2013-09-02 NOTE — Telephone Encounter (Signed)
Patient went to pain management at [facility]. They have been treating her for some time. Her back hurts so much that she can hardly walk. They are wanting Dr. Brett Fairy to make other appropriate next step referral.   I have scheduled patient to see Charlott Holler, NP on October 28 2013 for re-evaluation and for appropriate referral.

## 2013-09-12 ENCOUNTER — Other Ambulatory Visit: Payer: Self-pay | Admitting: Physician Assistant

## 2013-09-15 ENCOUNTER — Encounter: Payer: Self-pay | Admitting: Gastroenterology

## 2013-09-16 NOTE — Telephone Encounter (Signed)
Please obtain last visit note from old [facility] system.  have  i seen her for sleep?

## 2013-09-16 NOTE — Telephone Encounter (Signed)
Get the notes from [place] [facility] or whatever.

## 2013-09-17 NOTE — Telephone Encounter (Signed)
whenwas this closed, and is the patient request answered and clarified???

## 2013-09-17 NOTE — Telephone Encounter (Signed)
I called and spoke to Dr. Maryjean Ka CMA, Basilia Jumbo today and patient has been followed by their office for pain management.  Her last visit was 06-2013 and last spinal injection was 05-2013.  She has a follow up appointment 10-06-13.  The patient was complaining of still having pain, and issues tripping and was told they would refer her to the neurosurgeon for repeat testing and consultation.  Rosa said the patient gets confused and she thinks she may have heard neurologist instead of neurosurgeon.  They will explain to her on the next visit that its the neurosurgeon who needs to evaluate her.  We will wait until that follow up visit before cancelling visit with Jeani Hawking.  I have also had her recent office visits sent to [place] for doctor to review.  Hoyle Sauer last saw the patient on 04-15-13.  In that note she stated that patient had a sleep study at [facility] but it is addressed by her PCP Dr. Dorna Mai.  This telephone note was already closed when triage pool received it.

## 2013-09-18 DIAGNOSIS — Z609 Problem related to social environment, unspecified: Secondary | ICD-10-CM | POA: Insufficient documentation

## 2013-09-19 ENCOUNTER — Encounter: Payer: Self-pay | Admitting: Emergency Medicine

## 2013-09-19 ENCOUNTER — Ambulatory Visit (INDEPENDENT_AMBULATORY_CARE_PROVIDER_SITE_OTHER): Payer: Medicare Other | Admitting: Emergency Medicine

## 2013-09-19 VITALS — BP 124/80 | HR 99 | Ht 65.0 in | Wt 193.2 lb

## 2013-09-19 DIAGNOSIS — G4733 Obstructive sleep apnea (adult) (pediatric): Secondary | ICD-10-CM

## 2013-09-19 NOTE — Addendum Note (Signed)
Addended by: Carlos American A on: 09/19/2013 02:45 PM   Modules accepted: Orders

## 2013-09-19 NOTE — Progress Notes (Signed)
Subjective:    Patient ID: [name] [name], female    DOB: 05-21-1951, 63 y.o.   MRN: [id number]  HPI 63 yo woman, never smoker, mild CAD, mild AS, Hep C, HTN, small L PE in 05/2012 completed therapy, OSA not reliable on CPAP, DM, ovarian CA. Followed by Dr Harrington Challenger at [facility] and referred for dyspnea and hypoxemia. Of note was seen in ED 04/10/13 for chest pressure and dyspnea > V/q low prob. She has been having dyspnea and difficulty taking a deep breath for about 2 weeks. Never had this problem before. Bothers her when eating, when she wakes up in am while supine - better when she gets up. She describes recent falls - about 3 times in last several months. The last time was 3 weeks ago. No clear trauma to ribs, although she did hit her back and head. Just moved into a camper, not using CPAP for 9-10 months. She has been noted to have desats in the ED and at Cardiology. No wheeze, occas dry cough (on lisinopril). She has albuterol - may help her some.   ROV 05/15/13 -- follows up for dyspnea and hypoxemia. She returns for f/u. She has not restarted CPAP, although she did wear it for one night to get her ONO. We performed ONO on CPAP that showed desat < 88% for 20% of the night. PFT performed > show restriction and decreased DLCO. TTE from 2013 showed normal RV size and fxn.     ROV 09/19/13 - Hx dyspnea and hypoxemia.  She has not been able to go back to CPAP, feels the mask fit is poor  Review of Systems  Constitutional: Positive for unexpected weight change. Negative for fever.  HENT: Positive for congestion and trouble swallowing. Negative for dental problem, ear pain, nosebleeds, postnasal drip, rhinorrhea, sinus pressure, sneezing and sore throat.   Eyes: Negative for redness and itching.  Respiratory: Positive for cough and shortness of breath. Negative for chest tightness and wheezing.   Cardiovascular: Positive for leg swelling. Negative for palpitations.  Gastrointestinal: Negative for  nausea and vomiting.  Genitourinary: Negative for dysuria.  Musculoskeletal: Negative for joint swelling.  Skin: Negative for rash.  Neurological: Positive for dizziness and light-headedness. Negative for headaches.  Hematological: Does not bruise/bleed easily.  Psychiatric/Behavioral: Positive for dysphoric mood. The patient is not nervous/anxious.     06/21/12 --  Comparison: Chest x-ray of 05/24/2012  Findings: On the second contrast injection the pulmonary arteries  are moderately well opacified. The only questionable abnormality  is a tiny filling defect within a sub segmental branch of the  pulmonary artery to the left upper lobe. A very small pulmonary  embolism in this subsegmental branch cannot be excluded. No other  filling defect is seen throughout the pulmonary arterial system.  The thoracic aorta opacifies with no acute abnormality. The  origins of the great vessels are patent. No mediastinal or hilar  adenopathy is seen. Imaging through the upper abdomen, there is  diffuse fatty infiltration of the liver and the liver appears  enlarged. Surgical clips are present from prior cholecystectomy.  On the lung window images, no lung parenchymal lesion is seen. No  infiltrate is noted. No pleural effusion is noted. Incidental  calcified splenic granuloma are present from prior granulomatous  disease. No bony abnormality is seen.  IMPRESSION:  1. Tiny pulmonary embolus within a subsegmental branch of the left  upper lobe pulmonary artery. No other filling defect is seen.  2. No infiltrate,  effusion, or nodule.  3. Diffuse fatty infiltration of the liver. Suspect hepatomegaly  4. Calcified splenic granulomas from prior granulomatous disease.       Objective:   Physical Exam Filed Vitals:   09/19/13 1418  BP: 124/80  Pulse: 99  Height: 5\' 5"  (1.651 m)  Weight: 193 lb 3.2 oz (87.635 kg)  SpO2: 98%   Gen: Pleasant, overwt, in no distress, reserved affect  ENT: No  lesions,  mouth clear,  oropharynx clear, no postnasal drip  Neck: No JVD, no TMG, no carotid bruits, no stridor  Lungs: No use of accessory muscles, no dullness to percussion, clear without rales or rhonchi  Cardiovascular: RRR, heart sounds normal, no murmur or gallops, no peripheral edema  Musculoskeletal: No deformities, no cyanosis or clubbing  Neuro: alert, non focal  Skin: Warm, no lesions or rashes     Assessment & Plan:  Obstructive sleep apnea - need to restart CPAP. Will work on an alternative mask, possibly nasal pillows to see if she can tolerate - rov 3 month

## 2013-09-19 NOTE — Assessment & Plan Note (Signed)
-   need to restart CPAP. Will work on an alternative mask, possibly nasal pillows to see if she can tolerate - rov 3 month

## 2013-09-19 NOTE — Patient Instructions (Signed)
We will work on getting a more comfortable and useful CPAP maask from [facility].

## 2013-10-07 ENCOUNTER — Other Ambulatory Visit: Payer: Self-pay | Admitting: Gastroenterology

## 2013-10-15 ENCOUNTER — Other Ambulatory Visit: Payer: Self-pay | Admitting: Internal Medicine

## 2013-10-21 ENCOUNTER — Encounter: Payer: Self-pay | Admitting: Neurology

## 2013-10-28 ENCOUNTER — Ambulatory Visit: Payer: Self-pay | Admitting: Nurse Practitioner

## 2013-10-29 ENCOUNTER — Ambulatory Visit: Payer: Self-pay | Admitting: Nurse Practitioner

## 2013-10-29 ENCOUNTER — Telehealth: Payer: Self-pay | Admitting: Nurse Practitioner

## 2013-10-29 NOTE — Telephone Encounter (Signed)
Patient was no show for appointment

## 2013-11-11 ENCOUNTER — Inpatient Hospital Stay (HOSPITAL_COMMUNITY): Payer: Medicare Other

## 2013-11-11 ENCOUNTER — Emergency Department (HOSPITAL_COMMUNITY): Payer: Medicare Other

## 2013-11-11 ENCOUNTER — Other Ambulatory Visit: Payer: Self-pay

## 2013-11-11 ENCOUNTER — Inpatient Hospital Stay (HOSPITAL_COMMUNITY)
Admission: EM | Admit: 2013-11-11 | Discharge: 2013-11-13 | DRG: 069 | Disposition: A | Discharge status: Home / Self Care | Payer: Medicare Other | Attending: Internal Medicine | Admitting: Internal Medicine

## 2013-11-11 ENCOUNTER — Encounter (HOSPITAL_COMMUNITY): Payer: Self-pay | Admitting: Emergency Medicine

## 2013-11-11 DIAGNOSIS — R358 Other polyuria: Secondary | ICD-10-CM

## 2013-11-11 DIAGNOSIS — F3289 Other specified depressive episodes: Secondary | ICD-10-CM | POA: Diagnosis present

## 2013-11-11 DIAGNOSIS — R1310 Dysphagia, unspecified: Secondary | ICD-10-CM

## 2013-11-11 DIAGNOSIS — B182 Chronic viral hepatitis C: Secondary | ICD-10-CM | POA: Diagnosis present

## 2013-11-11 DIAGNOSIS — R1011 Right upper quadrant pain: Secondary | ICD-10-CM

## 2013-11-11 DIAGNOSIS — E119 Type 2 diabetes mellitus without complications: Secondary | ICD-10-CM

## 2013-11-11 DIAGNOSIS — R079 Chest pain, unspecified: Secondary | ICD-10-CM

## 2013-11-11 DIAGNOSIS — G8190 Hemiplegia, unspecified affecting unspecified side: Secondary | ICD-10-CM

## 2013-11-11 DIAGNOSIS — G4733 Obstructive sleep apnea (adult) (pediatric): Secondary | ICD-10-CM

## 2013-11-11 DIAGNOSIS — D72829 Elevated white blood cell count, unspecified: Secondary | ICD-10-CM | POA: Diagnosis present

## 2013-11-11 DIAGNOSIS — I2510 Atherosclerotic heart disease of native coronary artery without angina pectoris: Secondary | ICD-10-CM | POA: Diagnosis present

## 2013-11-11 DIAGNOSIS — R5383 Other fatigue: Secondary | ICD-10-CM

## 2013-11-11 DIAGNOSIS — K3184 Gastroparesis: Secondary | ICD-10-CM | POA: Diagnosis present

## 2013-11-11 DIAGNOSIS — J9610 Chronic respiratory failure, unspecified whether with hypoxia or hypercapnia: Secondary | ICD-10-CM | POA: Diagnosis present

## 2013-11-11 DIAGNOSIS — S83249A Other tear of medial meniscus, current injury, unspecified knee, initial encounter: Secondary | ICD-10-CM

## 2013-11-11 DIAGNOSIS — I6529 Occlusion and stenosis of unspecified carotid artery: Secondary | ICD-10-CM

## 2013-11-11 DIAGNOSIS — D131 Benign neoplasm of stomach: Secondary | ICD-10-CM

## 2013-11-11 DIAGNOSIS — I2699 Other pulmonary embolism without acute cor pulmonale: Secondary | ICD-10-CM

## 2013-11-11 DIAGNOSIS — Z8543 Personal history of malignant neoplasm of ovary: Secondary | ICD-10-CM

## 2013-11-11 DIAGNOSIS — R0989 Other specified symptoms and signs involving the circulatory and respiratory systems: Secondary | ICD-10-CM

## 2013-11-11 DIAGNOSIS — Z86711 Personal history of pulmonary embolism: Secondary | ICD-10-CM

## 2013-11-11 DIAGNOSIS — K219 Gastro-esophageal reflux disease without esophagitis: Secondary | ICD-10-CM | POA: Diagnosis present

## 2013-11-11 DIAGNOSIS — R7989 Other specified abnormal findings of blood chemistry: Secondary | ICD-10-CM

## 2013-11-11 DIAGNOSIS — Z833 Family history of diabetes mellitus: Secondary | ICD-10-CM

## 2013-11-11 DIAGNOSIS — Z79899 Other long term (current) drug therapy: Secondary | ICD-10-CM

## 2013-11-11 DIAGNOSIS — F329 Major depressive disorder, single episode, unspecified: Secondary | ICD-10-CM | POA: Diagnosis present

## 2013-11-11 DIAGNOSIS — R112 Nausea with vomiting, unspecified: Secondary | ICD-10-CM

## 2013-11-11 DIAGNOSIS — M1712 Unilateral primary osteoarthritis, left knee: Secondary | ICD-10-CM

## 2013-11-11 DIAGNOSIS — E860 Dehydration: Secondary | ICD-10-CM | POA: Diagnosis present

## 2013-11-11 DIAGNOSIS — B370 Candidal stomatitis: Secondary | ICD-10-CM

## 2013-11-11 DIAGNOSIS — R609 Edema, unspecified: Secondary | ICD-10-CM

## 2013-11-11 DIAGNOSIS — E782 Mixed hyperlipidemia: Secondary | ICD-10-CM

## 2013-11-11 DIAGNOSIS — N179 Acute kidney failure, unspecified: Secondary | ICD-10-CM

## 2013-11-11 DIAGNOSIS — I639 Cerebral infarction, unspecified: Secondary | ICD-10-CM

## 2013-11-11 DIAGNOSIS — G459 Transient cerebral ischemic attack, unspecified: Secondary | ICD-10-CM

## 2013-11-11 DIAGNOSIS — Z8249 Family history of ischemic heart disease and other diseases of the circulatory system: Secondary | ICD-10-CM

## 2013-11-11 DIAGNOSIS — R3589 Other polyuria: Secondary | ICD-10-CM

## 2013-11-11 DIAGNOSIS — R002 Palpitations: Secondary | ICD-10-CM

## 2013-11-11 DIAGNOSIS — R5381 Other malaise: Secondary | ICD-10-CM

## 2013-11-11 DIAGNOSIS — R42 Dizziness and giddiness: Secondary | ICD-10-CM

## 2013-11-11 DIAGNOSIS — L03116 Cellulitis of left lower limb: Secondary | ICD-10-CM

## 2013-11-11 DIAGNOSIS — I359 Nonrheumatic aortic valve disorder, unspecified: Secondary | ICD-10-CM | POA: Diagnosis present

## 2013-11-11 DIAGNOSIS — I509 Heart failure, unspecified: Secondary | ICD-10-CM | POA: Diagnosis present

## 2013-11-11 DIAGNOSIS — E7800 Pure hypercholesterolemia, unspecified: Secondary | ICD-10-CM

## 2013-11-11 DIAGNOSIS — E785 Hyperlipidemia, unspecified: Secondary | ICD-10-CM

## 2013-11-11 DIAGNOSIS — I6350 Cerebral infarction due to unspecified occlusion or stenosis of unspecified cerebral artery: Secondary | ICD-10-CM

## 2013-11-11 DIAGNOSIS — I10 Essential (primary) hypertension: Secondary | ICD-10-CM

## 2013-11-11 DIAGNOSIS — R0609 Other forms of dyspnea: Secondary | ICD-10-CM

## 2013-11-11 DIAGNOSIS — R197 Diarrhea, unspecified: Secondary | ICD-10-CM | POA: Diagnosis present

## 2013-11-11 LAB — COMPREHENSIVE METABOLIC PANEL
ALT: 41 U/L — ABNORMAL HIGH (ref 0–35)
AST: 91 U/L — ABNORMAL HIGH (ref 0–37)
Albumin: 3.9 g/dL (ref 3.5–5.2)
Alkaline Phosphatase: 44 U/L (ref 39–117)
BUN: 37 mg/dL — AB (ref 6–23)
CALCIUM: 9.8 mg/dL (ref 8.4–10.5)
CO2: 25 mEq/L (ref 19–32)
CREATININE: 3.57 mg/dL — AB (ref 0.50–1.10)
Chloride: 101 mEq/L (ref 96–112)
GFR calc Af Amer: 15 mL/min — ABNORMAL LOW (ref >90)
GFR calc non Af Amer: 13 mL/min — ABNORMAL LOW (ref >90)
Glucose, Bld: 172 mg/dL — ABNORMAL HIGH (ref 70–99)
Potassium: 5 mEq/L (ref 3.7–5.3)
Sodium: 145 mEq/L (ref 137–147)
TOTAL PROTEIN: 8 g/dL (ref 6.0–8.3)
Total Bilirubin: 0.4 mg/dL (ref 0.3–1.2)

## 2013-11-11 LAB — PROTIME-INR
INR: 1.01 (ref 0.00–1.49)
Prothrombin Time: 13.1 seconds (ref 11.6–15.2)

## 2013-11-11 LAB — I-STAT TROPONIN, ED: TROPONIN I, POC: 0 ng/mL (ref 0.00–0.08)

## 2013-11-11 LAB — DIFFERENTIAL
BASOS PCT: 0 % (ref 0–1)
Basophils Absolute: 0 10*3/uL (ref 0.0–0.1)
EOS ABS: 0.2 10*3/uL (ref 0.0–0.7)
Eosinophils Relative: 2 % (ref 0–5)
Lymphocytes Relative: 23 % (ref 12–46)
Lymphs Abs: 2.6 10*3/uL (ref 0.7–4.0)
MONO ABS: 0.7 10*3/uL (ref 0.1–1.0)
MONOS PCT: 6 % (ref 3–12)
NEUTROS PCT: 70 % (ref 43–77)
Neutro Abs: 8.1 10*3/uL — ABNORMAL HIGH (ref 1.7–7.7)

## 2013-11-11 LAB — I-STAT CHEM 8, ED
BUN: 36 mg/dL — AB (ref 6–23)
CHLORIDE: 104 meq/L (ref 96–112)
Calcium, Ion: 1.14 mmol/L (ref 1.13–1.30)
Creatinine, Ser: 3.9 mg/dL — ABNORMAL HIGH (ref 0.50–1.10)
Glucose, Bld: 175 mg/dL — ABNORMAL HIGH (ref 70–99)
HEMATOCRIT: 40 % (ref 36.0–46.0)
Hemoglobin: 13.6 g/dL (ref 12.0–15.0)
Potassium: 4.7 mEq/L (ref 3.7–5.3)
SODIUM: 144 meq/L (ref 137–147)
TCO2: 27 mmol/L (ref 0–100)

## 2013-11-11 LAB — CBC
HEMATOCRIT: 37.9 % (ref 36.0–46.0)
Hemoglobin: 12.4 g/dL (ref 12.0–15.0)
MCH: 29.5 pg (ref 26.0–34.0)
MCHC: 32.7 g/dL (ref 30.0–36.0)
MCV: 90 fL (ref 78.0–100.0)
Platelets: 308 10*3/uL (ref 150–400)
RBC: 4.21 MIL/uL (ref 3.87–5.11)
RDW: 13.6 % (ref 11.5–15.5)
WBC: 11.6 10*3/uL — ABNORMAL HIGH (ref 4.0–10.5)

## 2013-11-11 LAB — APTT: APTT: 27 s (ref 24–37)

## 2013-11-11 LAB — ETHANOL

## 2013-11-11 LAB — I-STAT CG4 LACTIC ACID, ED: LACTIC ACID, VENOUS: 1.38 mmol/L (ref 0.5–2.2)

## 2013-11-11 LAB — CBG MONITORING, ED: Glucose-Capillary: 163 mg/dL — ABNORMAL HIGH (ref 70–99)

## 2013-11-11 MED ORDER — SODIUM CHLORIDE 0.9 % IV BOLUS (SEPSIS)
1000.0000 mL | Freq: Once | INTRAVENOUS | Status: AC
  Administered 2013-11-11: 1000 mL via INTRAVENOUS

## 2013-11-11 MED ORDER — METRONIDAZOLE IN NACL 5-0.79 MG/ML-% IV SOLN
500.0000 mg | Freq: Three times a day (TID) | INTRAVENOUS | Status: DC
  Administered 2013-11-12 (×3): 500 mg via INTRAVENOUS
  Filled 2013-11-11 (×5): qty 100

## 2013-11-11 MED ORDER — SODIUM CHLORIDE 0.9 % IV SOLN
Freq: Once | INTRAVENOUS | Status: AC
  Administered 2013-11-11: 22:00:00 via INTRAVENOUS

## 2013-11-11 MED ORDER — ADULT MULTIVITAMIN W/MINERALS CH
1.0000 | ORAL_TABLET | Freq: Every day | ORAL | Status: DC
  Administered 2013-11-12 – 2013-11-13 (×2): 1 via ORAL
  Filled 2013-11-11 (×2): qty 1

## 2013-11-11 MED ORDER — PAROXETINE HCL ER 25 MG PO TB24
50.0000 mg | ORAL_TABLET | Freq: Every day | ORAL | Status: DC
  Administered 2013-11-12: 50 mg via ORAL
  Filled 2013-11-11 (×3): qty 2

## 2013-11-11 MED ORDER — HYDROCODONE-ACETAMINOPHEN 7.5-325 MG PO TABS
1.0000 | ORAL_TABLET | Freq: Two times a day (BID) | ORAL | Status: DC
  Administered 2013-11-12 – 2013-11-13 (×3): 1 via ORAL
  Filled 2013-11-11 (×3): qty 1

## 2013-11-11 MED ORDER — CYCLOSPORINE 0.05 % OP EMUL
1.0000 [drp] | Freq: Two times a day (BID) | OPHTHALMIC | Status: DC
  Administered 2013-11-12 – 2013-11-13 (×4): 1 [drp] via OPHTHALMIC
  Filled 2013-11-11 (×5): qty 1

## 2013-11-11 MED ORDER — ASPIRIN 300 MG RE SUPP
300.0000 mg | Freq: Every day | RECTAL | Status: DC
  Administered 2013-11-12: 300 mg via RECTAL
  Filled 2013-11-11 (×3): qty 1

## 2013-11-11 MED ORDER — INSULIN ASPART 100 UNIT/ML ~~LOC~~ SOLN
0.0000 [IU] | SUBCUTANEOUS | Status: DC
  Administered 2013-11-12: 1 [IU] via SUBCUTANEOUS
  Administered 2013-11-12 (×2): 2 [IU] via SUBCUTANEOUS
  Administered 2013-11-12: 1 [IU] via SUBCUTANEOUS

## 2013-11-11 MED ORDER — PROMETHAZINE HCL 25 MG PO TABS: 25.0000 mg | ORAL_TABLET | Freq: Four times a day (QID) | ORAL | Status: DC | PRN

## 2013-11-11 MED ORDER — SIMVASTATIN 40 MG PO TABS
40.0000 mg | ORAL_TABLET | Freq: Every day | ORAL | Status: DC
Start: 2013-11-12 — End: 2013-11-13
  Administered 2013-11-12: 40 mg via ORAL
  Filled 2013-11-11 (×2): qty 1

## 2013-11-11 MED ORDER — ASPIRIN 325 MG PO TABS
325.0000 mg | ORAL_TABLET | Freq: Every day | ORAL | Status: DC
  Administered 2013-11-12 – 2013-11-13 (×2): 325 mg via ORAL
  Filled 2013-11-11 (×2): qty 1

## 2013-11-11 MED ORDER — FENOFIBRATE 160 MG PO TABS
160.0000 mg | ORAL_TABLET | Freq: Every day | ORAL | Status: DC
Start: 2013-11-11 — End: 2013-11-13
  Administered 2013-11-12 – 2013-11-13 (×2): 160 mg via ORAL
  Filled 2013-11-11 (×3): qty 1

## 2013-11-11 MED ORDER — LEVOFLOXACIN IN D5W 750 MG/150ML IV SOLN
750.0000 mg | Freq: Once | INTRAVENOUS | Status: AC
  Administered 2013-11-12: 750 mg via INTRAVENOUS
  Filled 2013-11-11: qty 150

## 2013-11-11 MED ORDER — PANTOPRAZOLE SODIUM 40 MG PO TBEC
40.0000 mg | DELAYED_RELEASE_TABLET | Freq: Every day | ORAL | Status: DC
  Administered 2013-11-12 – 2013-11-13 (×2): 40 mg via ORAL
  Filled 2013-11-11: qty 1

## 2013-11-11 MED ORDER — PINDOLOL 5 MG PO TABS
5.0000 mg | ORAL_TABLET | Freq: Two times a day (BID) | ORAL | Status: DC
  Administered 2013-11-12 – 2013-11-13 (×2): 5 mg via ORAL
  Filled 2013-11-11 (×5): qty 1

## 2013-11-11 MED ORDER — ENOXAPARIN SODIUM 30 MG/0.3ML ~~LOC~~ SOLN
30.0000 mg | SUBCUTANEOUS | Status: DC
  Administered 2013-11-12 (×2): 30 mg via SUBCUTANEOUS
  Filled 2013-11-11 (×3): qty 0.3

## 2013-11-11 MED ORDER — DARIFENACIN HYDROBROMIDE ER 7.5 MG PO TB24
7.5000 mg | ORAL_TABLET | Freq: Every day | ORAL | Status: DC
Start: 2013-11-12 — End: 2013-11-13
  Administered 2013-11-12 – 2013-11-13 (×2): 7.5 mg via ORAL
  Filled 2013-11-11 (×2): qty 1

## 2013-11-11 MED ORDER — GABAPENTIN 300 MG PO CAPS
300.0000 mg | ORAL_CAPSULE | Freq: Two times a day (BID) | ORAL | Status: DC
  Administered 2013-11-12 – 2013-11-13 (×3): 300 mg via ORAL
  Filled 2013-11-11 (×4): qty 1

## 2013-11-11 MED ORDER — ARIPIPRAZOLE 5 MG PO TABS
5.0000 mg | ORAL_TABLET | Freq: Every morning | ORAL | Status: DC
  Administered 2013-11-12 – 2013-11-13 (×2): 5 mg via ORAL
  Filled 2013-11-11 (×2): qty 1

## 2013-11-11 MED ORDER — LISINOPRIL 20 MG PO TABS: 20.0000 mg | ORAL_TABLET | Freq: Every day | ORAL | Status: DC

## 2013-11-11 MED ORDER — SENNOSIDES-DOCUSATE SODIUM 8.6-50 MG PO TABS: 1.0000 | ORAL_TABLET | Freq: Every evening | ORAL | Status: DC | PRN

## 2013-11-11 MED ORDER — VITAMIN C 500 MG PO TABS
500.0000 mg | ORAL_TABLET | Freq: Every day | ORAL | Status: DC
  Administered 2013-11-12: 500 mg via ORAL
  Filled 2013-11-11 (×3): qty 1

## 2013-11-11 MED ORDER — LEVOFLOXACIN IN D5W 500 MG/100ML IV SOLN: 500.0000 mg | INTRAVENOUS | Status: DC

## 2013-11-11 NOTE — Consult Note (Signed)
Referring Physician: Dr. Wyvonnia Dusky    Chief Complaint: New-onset slurred speech and left-sided weakness  HPI: [name] [name] is an 63 y.o. female Mr. diabetes mellitus, hypertension, coronary disease, and hyperlipidemia as well as depression, presenting with slurred speech and left facial droop as well as left-sided weakness. Patient was last seen well at 5:30 PM today. She was noted to be dragging her left leg and having slightly slurred speech with facial droop at about 6:30 PM. There is no previous history of stroke nor TIA. Patient's been on aspirin but has not been taking aspirin on a daily basis. CT scan of her head showed no acute intracranial abnormality. NIH stroke score was 2.  LSN: 5:30 PM on 11/11/2013 tPA Given: No: Minimal deficits. MRankin: 2  Past Medical History  Diagnosis Date  . Hyperlipidemia   . Benign neoplasm of stomach   . Dizziness and giddiness   . Gastroparesis   . Esophageal reflux   . CAD (coronary artery disease)     a.  cath 2005: LAD 40%;  b. Myoview 3/12: EF 70%, no ischemia;  c. Myoview 9/13: EF 76%, no ischemia  . HTN (hypertension)   . Palpitations   . Depression   . Aortic stenosis     Echo 8/13: EF 55-60%, Mild LVH, mild AS, mean gradient 12 mmHg,   . CHF (congestive heart failure) 06-13-12    episode after Cancer surgery  . PONV (postoperative nausea and vomiting)   . DM2 (diabetes mellitus, type 2) 06-13-12    NIDDM  . Ovarian cancer 06-13-12    surgery only x2-no recent problems  . Chronic hepatitis C without mention of hepatic coma 06-13-12    "states is active"  . Sleep apnea 06-13-12    does not use cpap, cpap setting of 2 when used cpap    Family History  Problem Relation Age of Onset  . Brain cancer Father     spine cancer  . Heart disease Mother   . Breast cancer Sister   . Kidney disease Sister   . Diabetes Brother   . Esophageal cancer Brother   . Diabetes Maternal Aunt   . Diabetes Paternal Aunt   . Stomach cancer  Paternal Grandfather      Medications: I have reviewed the patient's current medications.  ROS: History obtained from child and the patient  General ROS: This is a taking a tendency to fall sleep fairly easily and sometimes appropriately Psychological ROS: negative for - behavioral disorder, hallucinations, memory difficulties, mood swings or suicidal ideation Ophthalmic ROS: negative for - blurry vision, double vision, eye pain or loss of vision ENT ROS: negative for - epistaxis, nasal discharge, oral lesions, sore throat, tinnitus or vertigo Allergy and Immunology ROS: negative for - hives or itchy/watery eyes Hematological and Lymphatic ROS: negative for - bleeding problems, bruising or swollen lymph nodes Endocrine ROS: negative for - galactorrhea, hair pattern changes, polydipsia/polyuria or temperature intolerance Respiratory ROS: negative for - cough, hemoptysis, shortness of breath or wheezing Cardiovascular ROS: negative for - chest pain, dyspnea on exertion, edema or irregular heartbeat Gastrointestinal ROS: Vomiting and diarrhea today Genito-Urinary ROS: negative for - dysuria, hematuria, incontinence or urinary frequency/urgency Musculoskeletal ROS: negative for - joint swelling or muscular weakness Neurological ROS: as noted in HPI Dermatological ROS: negative for rash and skin lesion changes  Physical Examination: Blood pressure 96/76, pulse 96, temperature 99.1 F (37.3 C), resp. rate 16, SpO2 95.00%.  Neurologic Examination: Mental Status: Alert, oriented, thought content  appropriate.  Speech slightly slurred without evidence of aphasia. Able to follow commands without difficulty. Cranial Nerves: II-Visual fields were normal. III/IV/VI-Pupils were equal and reacted. Extraocular movements were full and conjugate.    V/VII-no facial numbness; minimal left lower facial weakness. VIII-normal. X-mild dysarthria. Motor: 5/5 bilaterally with normal tone and bulk;  asterixis noted in all extremities. Sensory: Normal throughout. Deep Tendon Reflexes: 2+ and symmetric. Plantars: Flexor bilaterally Cerebellar: Normal finger-to-nose testing except for asterixis involving upper extremities.  Ct Head Wo Contrast  11/11/2013   CLINICAL DATA:  Difficulty speaking, facial droop, history coronary artery disease, hypertension, diabetes, ovarian cancer  EXAM: CT HEAD WITHOUT CONTRAST  TECHNIQUE: Contiguous axial images were obtained from the base of the skull through the vertex without intravenous contrast.  COMPARISON:  01/30/2010  FINDINGS: Normal ventricular morphology.  No midline shift or mass effect.  Normal appearance of brain parenchyma.  No intracranial hemorrhage, mass lesion or evidence acute infarction.  No extra-axial fluid collections.  Bones and sinuses unremarkable.  IMPRESSION: No acute intracranial abnormalities.  No interval change.  Findings called to Dr. Nicole Kindred on 11/11/2013 at 1933 hr.   Electronically Signed   By: Lavonia Dana M.D.   On: 11/11/2013 19:33    Assessment: 63 y.o. female with multiple risk factors for stroke presenting with possible small right subcortical TIA or ischemic stroke.  Stroke Risk Factors - diabetes mellitus, family history, hyperlipidemia and hypertension  Plan: 1. HgbA1c, fasting lipid panel 2. MRI, MRA  of the brain without contrast 3. PT consult, OT consult, Speech consult 4. Echocardiogram 5. Carotid dopplers 6. Prophylactic therapy-Antiplatelet med: Aspirin  7. Risk factor modification 8. Telemetry monitoring   C.R. Nicole Kindred, MD [facility] [phone]  11/11/2013, 8:44 PM

## 2013-11-11 NOTE — ED Notes (Signed)
Dr. Newton at bedside. 

## 2013-11-11 NOTE — ED Notes (Addendum)
Pt from home, today around 2:30-3 she began to vomit/diarrhea. Took a nap and woke up at 1530. Then around [place] her husband noticed facial drooping and pt being shaky. Slurred speech. A&Ox4. Last known well 1730. BP-113/60, HR-99.

## 2013-11-11 NOTE — H&P (Signed)
Hospitalist Admission History and Physical  Patient name: [name] [name] Medical record number: [id number] Date of birth: 06-13-1951 Age: 63 y.o. Gender: female  Primary Care Provider: Vicenta Aly, FNP  Chief Complaint: hemiparesis, slurred speech, vomiting, diarrhea   History of Present Illness:This is a 63 y.o. year old female with multiple medical problems DM, HLD, CAD, hep C, OSA on CPAP,  gastroparesis presenting with L sided hemiparesis. Family members they said they witnessed patient dragging her left leg over the course of the day. Also witnessed patient dropping a plate out of her left arm with left-sided facial drooping. Has been intermittently compliant with aspirin at home Patient was then emergently brought to the ER for further evaluation. A code stroke was called. Initial head CT was within normal limits. Initial NIH stroke score of 2. Neurology consulted with recommendation for inpatient stroke workup. Patient did have some lab abnormalities including a white blood cell count of 11.6 as well as a creatinine of 3.57 with a baseline creatinine of one. Also with tmax 99.1 and BP in 90s.  Patient reports that she's had recurrent episodes of vomiting and diarrhea over the past 2 days. No fevers or chills. This has been somewhat of an acute on chronic issue. Is followed by Dr. Deatra Ina for gastroparesis and hepatitis C. Last followup was one month ago. No medication changes at that time. Mild fevers and chills at home. Emesis and diarrhea has been nonbilious nonbloody.  Also seen by Dr. Lamonte Sakai w/ pulm in January 2013 for dyspnea and hypoxemia. Recommendation was for restarting CPAP. Patient has been intermittently compliant with this. Does wear 2 L of oxygen at home at times. Feels like she has required more oxygen at home  Patient Active Problem List   Diagnosis Date Noted  . CVA (cerebral infarction) 11/11/2013  . Hemiparesis 11/11/2013  . Obstructive sleep apnea 05/15/2013  . Acute  pulmonary embolism 06/21/2012  . Cellulitis of left leg 06/21/2012  . Osteoarthritis of left knee 06/14/2012  . Torn medial meniscus 06/14/2012  . Edema 03/13/2011  . FATIGUE 11/08/2010  . PALPITATIONS 11/08/2010  . MIXED HYPERLIPIDEMIA 11/18/2009  . Essential hypertension, benign 11/18/2009  . CAD, NATIVE VESSEL 11/08/2009  . CAROTID ARTERY STENOSIS, WITHOUT INFARCTION 11/08/2009  . DIABETES MELLITUS 10/07/2009  . ABDOMINAL PAIN RIGHT UPPER QUADRANT 10/07/2009  . HYPERCHOLESTEROLEMIA 07/29/2009  . HYPERURICEMIA 07/29/2009  . DYSPNEA ON EXERTION 03/29/2009  . GASTRIC POLYP 11/18/2008  . DYSLIPIDEMIA 11/18/2008  . DIZZINESS 11/18/2008  . CANDIDIASIS, ORAL 08/25/2008  . Diarrhea 07/27/2008  . POLYURIA 07/27/2008  . HEPATITIS C, CHRONIC 10/02/2007  . Esophageal reflux 10/02/2007  . Gastroparesis 10/02/2007  . CHEST PAIN UNSPECIFIED 10/02/2007  . DYSPHAGIA UNSPECIFIED 10/02/2007   Past Medical History: Past Medical History  Diagnosis Date  . Hyperlipidemia   . Benign neoplasm of stomach   . Dizziness and giddiness   . Gastroparesis   . Esophageal reflux   . CAD (coronary artery disease)     a.  cath 2005: LAD 40%;  b. Myoview 3/12: EF 70%, no ischemia;  c. Myoview 9/13: EF 76%, no ischemia  . HTN (hypertension)   . Palpitations   . Depression   . Aortic stenosis     Echo 8/13: EF 55-60%, Mild LVH, mild AS, mean gradient 12 mmHg,   . CHF (congestive heart failure) 06-13-12    episode after Cancer surgery  . PONV (postoperative nausea and vomiting)   . DM2 (diabetes mellitus, type 2) 06-13-12    NIDDM  .  Ovarian cancer 06-13-12    surgery only x2-no recent problems  . Chronic hepatitis C without mention of hepatic coma 06-13-12    "states is active"  . Sleep apnea 06-13-12    does not use cpap, cpap setting of 2 when used cpap    Past Surgical History: Past Surgical History  Procedure Laterality Date  . Tubal ligation    . Abdominal hysterectomy    .  Oophorectomy      1 ovary removed, then later last ovary removed  . Tonsillectomy  age 49  . Bladder suspension  yrs ago    with posterior repair  . Knee arthroscopy      left  . Cataract extraction Bilateral 03/2013  . Breast biopsy  1972    left  . Abdominal hysterectomy  yrs ago  . Cholecystectomy  5 yrs ago  . Colonoscopy N/A 08/11/2013    Procedure: COLONOSCOPY;  Surgeon: Inda Castle, MD;  Location: [facility];  Service: Endoscopy;  Laterality: N/A;    Social History: History   Social History  . Marital Status: Widowed    Spouse Name: N/A    Number of Children: 3  . Years of Education: N/A   Occupational History  . Disabled     memory loss   Social History Main Topics  . Smoking status: Never Smoker   . Smokeless tobacco: Never Used  . Alcohol Use: No     Comment: past hx. , no current use in 11 yrs.  . Drug Use: Yes    Special: "Crack" cocaine     Comment: last used 2002-crack cocaine-remains a nonuser  . Sexual Activity: No   Other Topics Concern  . None   Social History Narrative  . None    Family History: Family History  Problem Relation Age of Onset  . Brain cancer Father     spine cancer  . Heart disease Mother   . Breast cancer Sister   . Kidney disease Sister   . Diabetes Brother   . Esophageal cancer Brother   . Diabetes Maternal Aunt   . Diabetes Paternal Aunt   . Stomach cancer Paternal Grandfather     Allergies: Allergies  Allergen Reactions  . Sunflower Oil Anaphylaxis  . Iodine Nausea And Vomiting    Fish causes n/v  . Metoclopramide Hcl Other (See Comments)    Nerves tense up, panic attack  . Sulfamethoxazole Nausea And Vomiting    Current Facility-Administered Medications  Medication Dose Route Frequency Provider Last Rate Last Dose  . [START ON 11/12/2013] ARIPiprazole (ABILIFY) tablet 5 mg  5 mg Oral q morning - 10a Shanda Howells, MD      . aspirin suppository 300 mg  300 mg Rectal Daily Shanda Howells, MD       Or   . aspirin tablet 325 mg  325 mg Oral Daily Shanda Howells, MD      . cycloSPORINE (RESTASIS) 0.05 % ophthalmic emulsion 1 drop  1 drop Both Eyes BID Shanda Howells, MD      . darifenacin (ENABLEX) 24 hr tablet 7.5 mg  7.5 mg Oral Daily Shanda Howells, MD      . enoxaparin (LOVENOX) injection 30 mg  30 mg Subcutaneous Q24H Shanda Howells, MD      . fenofibrate tablet 160 mg  160 mg Oral Daily Shanda Howells, MD      . gabapentin (NEURONTIN) capsule 300 mg  300 mg Oral BID Shanda Howells, MD      .  HYDROcodone-acetaminophen (NORCO) 7.5-325 MG per tablet 1 tablet  1 tablet Oral BID Shanda Howells, MD      . insulin aspart (novoLOG) injection 0-9 Units  0-9 Units Subcutaneous 6 times per day Shanda Howells, MD      . lisinopril (PRINIVIL,ZESTRIL) tablet 20 mg  20 mg Oral Daily Shanda Howells, MD      . multivitamin with minerals tablet 1 tablet  1 tablet Oral Daily Shanda Howells, MD      . pantoprazole (PROTONIX) EC tablet 40 mg  40 mg Oral Daily Shanda Howells, MD      . PARoxetine (PAXIL-CR) 24 hr tablet 50 mg  50 mg Oral QHS Shanda Howells, MD      . pindolol (VISKEN) tablet 5 mg  5 mg Oral BID Shanda Howells, MD      . promethazine (PHENERGAN) tablet 25 mg  25 mg Oral Q6H PRN Shanda Howells, MD      . senna-docusate (Senokot-S) tablet 1 tablet  1 tablet Oral QHS PRN Shanda Howells, MD      . Derrill Memo ON 11/12/2013] simvastatin (ZOCOR) tablet 40 mg  40 mg Oral q1800 Shanda Howells, MD      . vitamin C (ASCORBIC ACID) tablet 500 mg  500 mg Oral QHS Shanda Howells, MD       Current Outpatient Prescriptions  Medication Sig Dispense Refill  . ABILIFY 5 MG tablet Take 5 mg by mouth every morning.       [name] ascorbic acid (VITAMIN C) 500 MG tablet Take 500 mg by mouth at bedtime.       [name] dexlansoprazole (DEXILANT) 60 MG capsule Take 60 mg by mouth every morning.      . furosemide (LASIX) 40 MG tablet Take 40 mg by mouth every morning.      . gabapentin (NEURONTIN) 300 MG capsule Take 300 mg by mouth 2 (two) times  daily.      [name] glimepiride (AMARYL) 4 MG tablet Take 4 mg by mouth every morning.       [name] HYDROcodone-acetaminophen (NORCO) 7.5-325 MG per tablet Take 1 tablet by mouth 2 (two) times daily.       [name] lisinopril (PRINIVIL,ZESTRIL) 20 MG tablet Take 20 mg by mouth daily.      . meloxicam (MOBIC) 15 MG tablet Take 15 mg by mouth daily.      . metFORMIN (GLUCOPHAGE) 500 MG tablet Take 500 mg by mouth 2 (two) times daily with a meal.      . Multiple Vitamin (MULTIVITAMIN WITH MINERALS) TABS tablet Take 1 tablet by mouth daily.      . nitroGLYCERIN (NITROSTAT) 0.4 MG SL tablet Place 0.4 mg under the tongue every 5 (five) minutes as needed for chest pain.       [name] PARoxetine (PAXIL-CR) 25 MG 24 hr tablet Take 50 mg by mouth at bedtime.       . pindolol (VISKEN) 5 MG tablet Take 5 mg by mouth 2 (two) times daily.       . pravastatin (PRAVACHOL) 80 MG tablet Take 80 mg by mouth every evening.      . promethazine (PHENERGAN) 25 MG tablet Take 25 mg by mouth every 6 (six) hours as needed for nausea or vomiting.      . RESTASIS 0.05 % ophthalmic emulsion Place 1 drop into both eyes 2 (two) times daily.       . riboflavin (VITAMIN B-2) 100 MG TABS Take 100 mg by mouth daily.      [name]  solifenacin (VESICARE) 5 MG tablet Take 5 mg by mouth daily.       . traMADol (ULTRAM) 50 MG tablet Take 50 mg by mouth 3 (three) times daily.       . fenofibrate (TRICOR) 145 MG tablet Take 145 mg by mouth daily.       Review Of Systems: 12 point ROS negative except as noted above in HPI.  Physical Exam: Filed Vitals:   11/11/13 2145  BP: 101/55  Pulse: 98  Temp:   Resp: 22    General: cooperative and slowed mentation HEENT: PERRLA and extra ocular movement intact Heart: S1, S2 normal, no murmur, rub or gallop, regular rate and rhythm Lungs: clear to auscultation, no wheezes or rales and unlabored breathing Abdomen: + bowel sounds, minimal abd tenderness diffusely Extremities: extremities normal, atraumatic, no cyanosis  or edema Skin:no rashes, no ecchymoses Neurology: normal without focal findings, PERLA, cranial nerves 2-12 intact and reflexes normal and symmetric  Labs and Imaging: Lab Results  Component Value Date/Time   NA 144 11/11/2013  7:32 PM   K 4.7 11/11/2013  7:32 PM   CL 104 11/11/2013  7:32 PM   CO2 25 11/11/2013  7:22 PM   BUN 36* 11/11/2013  7:32 PM   CREATININE 3.90* 11/11/2013  7:32 PM   CREATININE 1.11* 04/18/2013  4:52 PM   GLUCOSE 175* 11/11/2013  7:32 PM   Lab Results  Component Value Date   WBC 11.6* 11/11/2013   HGB 13.6 11/11/2013   HCT 40.0 11/11/2013   MCV 90.0 11/11/2013   PLT 308 11/11/2013   Urinalysis    Component Value Date/Time   COLORURINE YELLOW 04/10/2012 1504   APPEARANCEUR Sl Cloudy 04/10/2012 1504   LABSPEC 1.020 04/10/2012 1504   PHURINE 6.0 04/10/2012 1504   GLUCOSEU NEGATIVE 05/21/2009 0407   HGBUR NEGATIVE 04/10/2012 1504   BILIRUBINUR NEGATIVE 04/10/2012 1504   KETONESUR NEGATIVE 04/10/2012 1504   PROTEINUR NEGATIVE 05/21/2009 0407   UROBILINOGEN 0.2 04/10/2012 1504   NITRITE POSITIVE 04/10/2012 1504   LEUKOCYTESUR NEGATIVE 04/10/2012 1504       Ct Head Wo Contrast  11/11/2013   CLINICAL DATA:  Difficulty speaking, facial droop, history coronary artery disease, hypertension, diabetes, ovarian cancer  EXAM: CT HEAD WITHOUT CONTRAST  TECHNIQUE: Contiguous axial images were obtained from the base of the skull through the vertex without intravenous contrast.  COMPARISON:  01/30/2010  FINDINGS: Normal ventricular morphology.  No midline shift or mass effect.  Normal appearance of brain parenchyma.  No intracranial hemorrhage, mass lesion or evidence acute infarction.  No extra-axial fluid collections.  Bones and sinuses unremarkable.  IMPRESSION: No acute intracranial abnormalities.  No interval change.  Findings called to Dr. Nicole Kindred on 11/11/2013 at 1933 hr.   Electronically Signed   By: Lavonia Dana M.D.   On: 11/11/2013 19:33     Assessment and Plan: [name] [name]  is a 63 y.o. year old female presenting with hemiparesis, AKI, leukocytosis  Hemiparesis: proceed down CVA workup per neuro recs. Check A1c, TSH, lipid panel, MRI/MRA, carotid Dopplers, 2-D echo. Start back on aspirin.  AKI: Likely secondary to dehydration in the setting of vomiting and diarrhea. Hydration with normal saline. Hold diuretic. Avoid NSAIDs. Hold ACE. Clinically dry on exam. Check FeNa. Recheck Cr in am.   Leukocytosis: Fairly broad differential. Question reactive in setting of possible stroke. Height concern for infectious process including respiratory, GI/GU sources. Start on Levaquin and Flagyl for respiratory and abdominal coverage.? Possible gastroparesis  flare. Chest x-ray pending. Higher risk of aspiration event given vomiting and questionable CVA. CT of the abdomen and pelvis also pending. UA mildly indicative of infection. Levaquin should give adequate coverage. A urine culture. Stool studies. We'll continue to trend.  Diabetes: Sliding scale insulin. A1c.  Hypertension: Lower limit of normal blood pressures. Hold antihypertensive medications while being hydrated.  OSA: Continue CPAP and supplemental O2.  Gastroparesis: Continue Phenergan. Consider GI consult if symptoms persist despite otherwise negative workup. FEN/GI: heart healthy carb modified diet.  Prophylaxis: sub q heparin  Disposition: pending further evaluation  Code Status:Full Code.       Shanda Howells MD  Pager: [phone]

## 2013-11-11 NOTE — ED Provider Notes (Signed)
CSN: [id number]     Arrival date & time 11/11/13  1905 History   First MD Initiated Contact with Patient 11/11/13 1930     Chief Complaint  Patient presents with  . Code Stroke     (Consider location/radiation/quality/duration/timing/severity/associated sxs/prior Treatment) HPI Comments: Patient endorses frequent diarrhea for the past several days up to 10 times a day. She has had sick contacts at home. Today she developed episodes of nausea and vomiting. About 45 minutes ago, her son states that she developed slurred speech, moving mouth and dragging her left foot. He is concerned about a stroke. A code stroke was called in triage. Patient denies any headache, chest pain or abdominal pain. She denies any fevers. She last used antibiotics one month ago.. Denies any numbness or tingling.  The history is provided by the patient and a relative. The history is limited by the absence of a caregiver.    Past Medical History  Diagnosis Date  . Hyperlipidemia   . Benign neoplasm of stomach   . Dizziness and giddiness   . Gastroparesis   . Esophageal reflux   . CAD (coronary artery disease)     a.  cath 2005: LAD 40%;  b. Myoview 3/12: EF 70%, no ischemia;  c. Myoview 9/13: EF 76%, no ischemia  . HTN (hypertension)   . Palpitations   . Depression   . Aortic stenosis     Echo 8/13: EF 55-60%, Mild LVH, mild AS, mean gradient 12 mmHg,   . CHF (congestive heart failure) 06-13-12    episode after Cancer surgery  . PONV (postoperative nausea and vomiting)   . DM2 (diabetes mellitus, type 2) 06-13-12    NIDDM  . Ovarian cancer 06-13-12    surgery only x2-no recent problems  . Chronic hepatitis C without mention of hepatic coma 06-13-12    "states is active"  . Sleep apnea 06-13-12    does not use cpap, cpap setting of 2 when used cpap   Past Surgical History  Procedure Laterality Date  . Tubal ligation    . Abdominal hysterectomy    . Oophorectomy      1 ovary removed, then later last  ovary removed  . Tonsillectomy  age 30  . Bladder suspension  yrs ago    with posterior repair  . Knee arthroscopy      left  . Cataract extraction Bilateral 03/2013  . Breast biopsy  1972    left  . Abdominal hysterectomy  yrs ago  . Cholecystectomy  5 yrs ago  . Colonoscopy N/A 08/11/2013    Procedure: COLONOSCOPY;  Surgeon: Inda Castle, MD;  Location: [facility];  Service: Endoscopy;  Laterality: N/A;   Family History  Problem Relation Age of Onset  . Brain cancer Father     spine cancer  . Heart disease Mother   . Breast cancer Sister   . Kidney disease Sister   . Diabetes Brother   . Esophageal cancer Brother   . Diabetes Maternal Aunt   . Diabetes Paternal Aunt   . Stomach cancer Paternal Grandfather    History  Substance Use Topics  . Smoking status: Never Smoker   . Smokeless tobacco: Never Used  . Alcohol Use: No     Comment: past hx. , no current use in 11 yrs.   OB History   Grav Para Term Preterm Abortions TAB SAB Ect Mult Living  Review of Systems  Constitutional: Positive for activity change, appetite change and fatigue. Negative for fever.  Respiratory: Negative for cough, chest tightness and shortness of breath.   Cardiovascular: Negative for chest pain.  Gastrointestinal: Positive for nausea, vomiting and diarrhea. Negative for abdominal pain.  Genitourinary: Negative for dysuria, hematuria, vaginal bleeding and vaginal discharge.  Musculoskeletal: Negative for arthralgias, back pain and myalgias.  Skin: Negative for rash.  Neurological: Positive for dizziness, speech difficulty, weakness, light-headedness and numbness. Negative for headaches.  A complete 10 system review of systems was obtained and all systems are negative except as noted in the HPI and PMH.      Allergies  Sunflower oil; Iodine; Metoclopramide hcl; and Sulfamethoxazole  Home Medications   No current outpatient prescriptions on file. BP 108/54  Pulse 98   Temp(Src) 98.4 F (36.9 C) (Oral)  Resp 18  Ht 5\' 6"  (1.676 m)  Wt 193 lb 1.6 oz (87.59 kg)  BMI 31.18 kg/m2  SpO2 90% Physical Exam  Constitutional: She is oriented to person, place, and time. She appears well-developed and well-nourished. No distress.  HENT:  Head: Normocephalic and atraumatic.  Mouth/Throat: Oropharynx is clear and moist. No oropharyngeal exudate.  Dry mucus membranes  Eyes: Conjunctivae are normal. Pupils are equal, round, and reactive to light.  Neck: Normal range of motion. Neck supple.  Cardiovascular: Normal rate, regular rhythm and normal heart sounds.   No murmur heard. Pulmonary/Chest: Effort normal and breath sounds normal. No respiratory distress.  Abdominal: Soft. There is no tenderness. There is no rebound and no guarding.  Musculoskeletal: Normal range of motion. She exhibits no edema and no tenderness.  Neurological: She is alert and oriented to person, place, and time. A cranial nerve deficit is present.  Questionable L facial droop Resting tremor and asterixis.  5/5 strength throughout,  Slurred speech.    Skin: Skin is warm.    ED Course  Procedures (including critical care time) Labs Review Labs Reviewed  CBC - Abnormal; Notable for the following:    WBC 11.6 (*)    All other components within normal limits  DIFFERENTIAL - Abnormal; Notable for the following:    Neutro Abs 8.1 (*)    All other components within normal limits  COMPREHENSIVE METABOLIC PANEL - Abnormal; Notable for the following:    Glucose, Bld 172 (*)    BUN 37 (*)    Creatinine, Ser 3.57 (*)    AST 91 (*)    ALT 41 (*)    GFR calc non Af Amer 13 (*)    GFR calc Af Amer 15 (*)    All other components within normal limits  GLUCOSE, CAPILLARY - Abnormal; Notable for the following:    Glucose-Capillary 149 (*)    All other components within normal limits  CBG MONITORING, ED - Abnormal; Notable for the following:    Glucose-Capillary 163 (*)    All other  components within normal limits  I-STAT CHEM 8, ED - Abnormal; Notable for the following:    BUN 36 (*)    Creatinine, Ser 3.90 (*)    Glucose, Bld 175 (*)    All other components within normal limits  URINE CULTURE  CLOSTRIDIUM DIFFICILE BY PCR  STOOL CULTURE  OVA AND PARASITE EXAMINATION  ETHANOL  PROTIME-INR  APTT  URINE RAPID DRUG SCREEN (HOSP PERFORMED)  URINALYSIS, ROUTINE W REFLEX MICROSCOPIC  HEMOGLOBIN A1C  LIPID PANEL  SODIUM, URINE, RANDOM  CREATININE, URINE, RANDOM  FECAL LACTOFERRIN  I-STAT  [place], ED  I-STAT TROPOININ, ED  I-STAT CG4 LACTIC ACID, ED   Imaging Review Ct Abdomen Pelvis Wo Contrast  11/12/2013   CLINICAL DATA:  Vomiting and diarrhea.  EXAM: CT ABDOMEN AND PELVIS WITHOUT CONTRAST  TECHNIQUE: Multidetector CT imaging of the abdomen and pelvis was performed following the standard protocol without intravenous contrast.  COMPARISON:  CT abdomen and pelvis 03/18/2007.  FINDINGS: Mild linear atelectasis or scarring in the lung bases is noted. No pleural or pericardial effusion.  The patient is status post cholecystectomy. There is fatty infiltration of the liver without focal lesion. Splenic calcifications are consistent with old granulomatous disease and unchanged. The adrenal glands, pancreas and kidneys appear normal. The stomach, small and large bowel and appendix appear normal. No lymphadenopathy or fluid. No lytic or sclerotic bony lesion.  IMPRESSION: No acute finding abdomen or pelvis.  Fatty infiltration of the liver.   Electronically Signed   By: Inge Rise M.D.   On: 11/12/2013 01:29   Dg Chest 2 View  11/11/2013   CLINICAL DATA:  Cough and shortness of breath.  EXAM: CHEST  2 VIEW  COMPARISON:  PA and lateral chest 04/10/2013.  FINDINGS: Lungs are clear. Heart size is normal. No pneumothorax or pleural effusion. Asymmetric elevation of the right hemidiaphragm relative to the left is unchanged.  IMPRESSION: No acute disease.   Electronically  Signed   By: Inge Rise M.D.   On: 11/11/2013 22:37   Ct Head Wo Contrast  11/11/2013   CLINICAL DATA:  Difficulty speaking, facial droop, history coronary artery disease, hypertension, diabetes, ovarian cancer  EXAM: CT HEAD WITHOUT CONTRAST  TECHNIQUE: Contiguous axial images were obtained from the base of the skull through the vertex without intravenous contrast.  COMPARISON:  01/30/2010  FINDINGS: Normal ventricular morphology.  No midline shift or mass effect.  Normal appearance of brain parenchyma.  No intracranial hemorrhage, mass lesion or evidence acute infarction.  No extra-axial fluid collections.  Bones and sinuses unremarkable.  IMPRESSION: No acute intracranial abnormalities.  No interval change.  Findings called to Dr. Nicole Kindred on 11/11/2013 at 1933 hr.   Electronically Signed   By: Lavonia Dana M.D.   On: 11/11/2013 19:33     EKG Interpretation None      MDM   Final diagnoses:  Acute renal failure  TIA (transient ischemic attack)  Nausea and vomiting   Patient with diarrhea, nausea and vomiting for the past several days. An hour prior to presentation she developed difficulty speaking with questionable facial droop or difficulty walking. Code stroke called on arrival.  CT head negative. Seen by neurology.  Patient has minimal deficits and is not a candidate for tPA as her deficits are minimal and improving.  She appears dehydrated and has elevated Cr. IVF started.  WBC 11.  Abdomen soft.  hospitalist requests abdominal CT which was unremarkable.  Will need admission for IVF, AKI and further workup of stroke symptoms. D/w Dr. Ernestina Patches.   Date: 11/11/2013  Rate: 98  Rhythm: normal sinus rhythm  QRS Axis: normal  Intervals: normal  ST/T Wave abnormalities: nonspecific ST/T changes  Conduction Disutrbances:none  Narrative Interpretation:   Old EKG Reviewed: unchanged    Ezequiel Essex, MD 11/12/13 [phone]

## 2013-11-11 NOTE — Code Documentation (Signed)
Patient in normal state of health when she started having diarrhea and emesis around 1430/1500, patient took a nap around 1730, she was noted to have facial droop and slurred speech around 1830. Patient arrived via private vehicle to [facility] triage. Code stroke activated. NIH 2, symptoms mild and improving per family. Will continue to monitor

## 2013-11-11 NOTE — Progress Notes (Signed)
CPAP machine is set up, ready to use in Patient's room, however the patient is going down for a procedure at midnight so she wants to wait until after to see if she feels like trying the machine. RT will continue to assist as needed.

## 2013-11-11 NOTE — Progress Notes (Addendum)
ANTIBIOTIC CONSULT NOTE - INITIAL  Pharmacy Consult for levaquin Indication: intra-abdominal infxn  Allergies  Allergen Reactions  . Sunflower Oil Anaphylaxis  . Iodine Nausea And Vomiting    Fish causes n/v  . Metoclopramide Hcl Other (See Comments)    Nerves tense up, panic attack  . Sulfamethoxazole Nausea And Vomiting    Patient Measurements:   Adjusted Body Weight:   Vital Signs: Temp: 99.1 F (37.3 C) (03/17 1957) BP: 94/52 mmHg (03/17 2215) Pulse Rate: 98 (03/17 2215) Intake/Output from previous day:   Intake/Output from this shift:    Labs:  Recent Labs  11/11/13 1922 11/11/13 1932  WBC 11.6*  --   HGB 12.4 13.6  PLT 308  --   CREATININE 3.57* 3.90*   The CrCl is unknown because both a height and weight (above a minimum accepted value) are required for this calculation. No results found for this basename: VANCOTROUGH, VANCOPEAK, VANCORANDOM, GENTTROUGH, GENTPEAK, GENTRANDOM, TOBRATROUGH, TOBRAPEAK, TOBRARND, AMIKACINPEAK, AMIKACINTROU, AMIKACIN,  in the last 72 hours   Microbiology: No results found for this or any previous visit (from the past 720 hour(s)).  Medical History: Past Medical History  Diagnosis Date  . Hyperlipidemia   . Benign neoplasm of stomach   . Dizziness and giddiness   . Gastroparesis   . Esophageal reflux   . CAD (coronary artery disease)     a.  cath 2005: LAD 40%;  b. Myoview 3/12: EF 70%, no ischemia;  c. Myoview 9/13: EF 76%, no ischemia  . HTN (hypertension)   . Palpitations   . Depression   . Aortic stenosis     Echo 8/13: EF 55-60%, Mild LVH, mild AS, mean gradient 12 mmHg,   . CHF (congestive heart failure) 06-13-12    episode after Cancer surgery  . PONV (postoperative nausea and vomiting)   . DM2 (diabetes mellitus, type 2) 06-13-12    NIDDM  . Ovarian cancer 06-13-12    surgery only x2-no recent problems  . Chronic hepatitis C without mention of hepatic coma 06-13-12    "states is active"  . Sleep apnea  06-13-12    does not use cpap, cpap setting of 2 when used cpap   Assessment: 89 yof presented to the ED with slurred speech and weakness. To start empiric levaquin + flagyl for intra-abdominal coverage. Pt is afebrile but WBC is elevated at 11.6. Of note, Scr is significantly elevated at 3.9.  Levaquin 3/17>> Flagyl 3/17>>  Goal of Therapy:  Eradication of infection  Plan:  1. Levaquin 750mg  IV x 1 then 500mg  IV Q48H 2. F/u renal fxn, C&S, clinical status   Rumbarger, Rande Lawman 11/11/2013,10:34 PM  Pharmacy to sign off -- re-consult if needed Thank you. Anette Guarneri, PharmD

## 2013-11-12 ENCOUNTER — Inpatient Hospital Stay (HOSPITAL_COMMUNITY): Payer: Medicare Other

## 2013-11-12 DIAGNOSIS — R197 Diarrhea, unspecified: Secondary | ICD-10-CM

## 2013-11-12 DIAGNOSIS — I517 Cardiomegaly: Secondary | ICD-10-CM

## 2013-11-12 DIAGNOSIS — E860 Dehydration: Secondary | ICD-10-CM

## 2013-11-12 DIAGNOSIS — G8190 Hemiplegia, unspecified affecting unspecified side: Secondary | ICD-10-CM

## 2013-11-12 DIAGNOSIS — N179 Acute kidney failure, unspecified: Secondary | ICD-10-CM

## 2013-11-12 LAB — URINALYSIS, ROUTINE W REFLEX MICROSCOPIC
BILIRUBIN URINE: NEGATIVE
Glucose, UA: NEGATIVE mg/dL
HGB URINE DIPSTICK: NEGATIVE
KETONES UR: NEGATIVE mg/dL
Nitrite: NEGATIVE
PROTEIN: NEGATIVE mg/dL
SPECIFIC GRAVITY, URINE: 1.02 (ref 1.005–1.030)
UROBILINOGEN UA: 0.2 mg/dL (ref 0.0–1.0)
pH: 5 (ref 5.0–8.0)

## 2013-11-12 LAB — URINE MICROSCOPIC-ADD ON

## 2013-11-12 LAB — SODIUM, URINE, RANDOM: SODIUM UR: 84 meq/L

## 2013-11-12 LAB — LIPID PANEL
Cholesterol: 136 mg/dL (ref 0–200)
HDL: 19 mg/dL — AB (ref >39)
LDL Cholesterol: 42 mg/dL (ref 0–99)
Total CHOL/HDL Ratio: 7.2 RATIO
Triglycerides: 374 mg/dL — ABNORMAL HIGH (ref <150)
VLDL: 75 mg/dL — ABNORMAL HIGH (ref 0–40)

## 2013-11-12 LAB — RAPID URINE DRUG SCREEN, HOSP PERFORMED
AMPHETAMINES: NOT DETECTED
BARBITURATES: NOT DETECTED
BENZODIAZEPINES: NOT DETECTED
COCAINE: NOT DETECTED
OPIATES: NOT DETECTED
Tetrahydrocannabinol: NOT DETECTED

## 2013-11-12 LAB — GLUCOSE, CAPILLARY
GLUCOSE-CAPILLARY: 144 mg/dL — AB (ref 70–99)
GLUCOSE-CAPILLARY: 125 mg/dL — AB (ref 70–99)
GLUCOSE-CAPILLARY: 172 mg/dL — AB (ref 70–99)
Glucose-Capillary: 149 mg/dL — ABNORMAL HIGH (ref 70–99)
Glucose-Capillary: 195 mg/dL — ABNORMAL HIGH (ref 70–99)

## 2013-11-12 LAB — HEMOGLOBIN A1C
HEMOGLOBIN A1C: 7.5 % — AB (ref <5.7)
MEAN PLASMA GLUCOSE: 169 mg/dL — AB (ref <117)

## 2013-11-12 LAB — CREATININE, URINE, RANDOM: CREATININE, URINE: 174.89 mg/dL

## 2013-11-12 MED ORDER — IOHEXOL 300 MG/ML  SOLN: 100.0000 mL | Freq: Once | INTRAMUSCULAR | Status: AC | PRN

## 2013-11-12 MED ORDER — SODIUM CHLORIDE 0.9 % IV SOLN
INTRAVENOUS | Status: DC
  Administered 2013-11-12: 22:00:00 via INTRAVENOUS

## 2013-11-12 NOTE — Progress Notes (Signed)
   CARE MANAGEMENT NOTE 11/12/2013  Patient:  [name] [name], [name] [name]   Account Number:  [id number]  Date Initiated:  11/12/2013  Documentation initiated by:  Olga Coaster  Subjective/Objective Assessment:   ADMITTED FOR STROKE WORKUP     Action/Plan:   CM FOLLOWING FOR DCP   Anticipated DC Date:  11/15/2013   Anticipated DC Plan; AWAITING ON PT/OT EVALS FOR disposition needs;    DC Planning Services  CM consult          Status of service:  In process, will continue to follow Medicare Important Message given?  NA - LOS <3 / Initial given by admissions (If response is "NO", the following Medicare IM given date fields will be blank)  Per UR Regulation:  Reviewed for med. necessity/level of care/duration of stay  Comments:  3/18/2015Mindi Slicker RN,BSN,MHA [phone]

## 2013-11-12 NOTE — Progress Notes (Signed)
Around 2111, telemetry monitor alerted RN to change in pt's rhythm from NSR to accelerated junctional. Pt asymptomatic. Forrest Moron, NP made aware of rhythm change. EKG was ordered and completed. Resulted NSR with incomplete right bundle branch block. Pt still asymptomatic at this time. Forrest Moron, NP paged with results. Will continue to monitor pt. Will pass on to next RN in report. Elspeth Cho, RN 11/12/13 [phone]

## 2013-11-12 NOTE — Evaluation (Signed)
Physical Therapy Evaluation Patient Details Name: [name] [name] MRN: [id number] DOB: 27-May-1951 Today's Date: 11/12/2013 Time: 0829-0900 PT Time Calculation (min): 31 min  PT Assessment / Plan / Recommendation History of Present Illness  pt presents with L sided weakness and numbness.    Clinical Impression  Pt generally weak and debilitated.  Per pt and family pt has hx of falls at home and has had a shake for about 2-3 months.  Family and pt wish to return to home at D/C and would benefit from [place], [place], and Hospital Bed.  Will continue to follow.      PT Assessment  Patient needs continued PT services    Follow Up Recommendations  Home health PT;Supervision/Assistance - 24 hour    Does the patient have the potential to tolerate intense rehabilitation      Barriers to Discharge        Equipment Recommendations  Hospital bed    Recommendations for Other Services     Frequency Min 4X/week    Precautions / Restrictions Precautions Precautions: Fall Precaution Comments: Hx of falls at home and per pt and family has been shaking for ~2-76months.   Restrictions Weight Bearing Restrictions: No   Pertinent Vitals/Pain Denied pain.        Mobility  Bed Mobility Overal bed mobility: Needs Assistance Bed Mobility: Supine to Sit Supine to sit: Supervision;HOB elevated General bed mobility comments: pt needs HOB elevated and use of bed rail to complete without A.   Transfers Overall transfer level: Needs assistance Equipment used: None Transfers: Sit to/from [facility] Sit to Stand: Min guard Stand pivot transfers: Min assist General transfer comment: pt c/o L knee feeling weak and pt with visible shaking.  pt and family indicate this a been going on for 2-41months.   Modified Rankin (Stroke Patients Only) Pre-Morbid Rankin Score: Moderate disability Modified Rankin: Severe disability    Exercises     PT Diagnosis: Difficulty walking  PT Problem List:  Decreased strength;Decreased activity tolerance;Decreased balance;Decreased mobility;Decreased coordination;Decreased cognition;Decreased knowledge of use of DME;Impaired sensation PT Treatment Interventions: DME instruction;Gait training;Stair training;Functional mobility training;Therapeutic activities;Therapeutic exercise;Balance training;Neuromuscular re-education;Patient/family education;Cognitive remediation     PT Goals(Current goals can be found in the care plan section) Acute Rehab PT Goals Patient Stated Goal: Home PT Goal Formulation: With patient Time For Goal Achievement: 11/26/13 Potential to Achieve Goals: Good  Visit Information  Last PT Received On: 11/12/13 Assistance Needed: +1 History of Present Illness: pt presents with L sided weakness and numbness.         Prior [place] expects to be discharged to:: Private residence Living Arrangements: Children Available Help at Discharge: Family;Available 24 hours/day (Rarely is alone) Type of Home: House Home Access: Stairs to enter [employer] of Steps: few Entrance Stairs-Rails: Right;Left Home Layout: One level Home Equipment: Walker - 4 wheels;Walker - 2 wheels;Bedside commode;Tub bench;Toilet riser Additional Comments: pt's daughter-in-law has DME that her mother is not using.  Son is in process of remodeling bathroom to make walk-in-shower with built-in bench.   Prior Function Level of Independence: Needs assistance Gait / Transfers Assistance Needed: No AD, but states has had multiple falls.   ADL's / Homemaking Assistance Needed: Family performs homemaking tasks, pt I with ADLs.   Communication / Swallowing Assistance Needed: Hx of Esophageal Stricture with SLP to assess.   Communication Communication: No difficulties    Cognition  Cognition Arousal/Alertness: Awake/alert Behavior During Therapy: Flat affect Overall Cognitive Status:  History of cognitive  impairments - at baseline (pt with memory deficits at baseline.  ) Memory: Decreased short-term memory    Extremity/Trunk Assessment Upper Extremity Assessment Upper Extremity Assessment: Defer to OT evaluation Lower Extremity Assessment Lower Extremity Assessment: Generalized weakness;LLE deficits/detail LLE Deficits / Details: pt generally weak Bil, but relates heaviness in L LE and L knee feels weak in standing.   LLE Sensation: decreased light touch LLE Coordination: decreased fine motor   Balance Balance Overall balance assessment: Needs assistance;History of Falls Standing balance support: No upper extremity supported Standing balance-Leahy Scale: Fair Standing balance comment: pt able to maintain standing, however begins to shake throughout her body.    End of Session PT - End of Session Equipment Utilized During Treatment: Gait belt;Oxygen Activity Tolerance: Patient limited by fatigue Patient left: in chair;with call bell/phone within reach;with family/visitor present Nurse Communication: Mobility status  GP     Catarina Hartshorn, [place] 11/12/2013, 10:17 AM

## 2013-11-12 NOTE — Progress Notes (Signed)
Stroke Team Progress Note  HISTORY [name] [name] is an 63 y.o. female Mr. diabetes mellitus, hypertension, coronary disease, and hyperlipidemia as well as depression, presenting with slurred speech and left facial droop as well as left-sided weakness. Patient was last seen well at 5:30 PM 3/172015. She was noted to be dragging her left leg and having slightly slurred speech with facial droop at about 6:30 PM. There is no previous history of stroke nor TIA. Patient's been on aspirin but has not been taking aspirin on a daily basis. CT scan of her head showed no acute intracranial abnormality. NIH stroke score was 2.  Patient was not administerd TPA secondary to minimal deficits. She was admitted for further evaluation and treatment.  SUBJECTIVE Her son is at the bedside. The patient is currently in the vascular lab.  Overall she feels her condition is stable. She is really interested in participation in the SOCRATES study.  OBJECTIVE Most recent Vital Signs: Filed Vitals:   11/11/13 2324 11/11/13 2351 11/12/13 0400 11/12/13 0549  BP:   108/66 100/57  Pulse: 98  102 99  Temp:   97.7 F (36.5 C) 99.5 F (37.5 C)  TempSrc:   Oral Oral  Resp: 18  20 20   Height:  5\' 6"  (1.676 m)    Weight:  87.59 kg (193 lb 1.6 oz)    SpO2: 90%  95% 94%   CBG (last 3)   Recent Labs  11/11/13 1913 11/12/13 0030 11/12/13 0412  GLUCAP 163* 149* 144*    IV Fluid Intake:     MEDICATIONS  . ARIPiprazole  5 mg Oral q morning - 10a  . aspirin  300 mg Rectal Daily   Or  . aspirin  325 mg Oral Daily  . cycloSPORINE  1 drop Both Eyes BID  . darifenacin  7.5 mg Oral Daily  . enoxaparin (LOVENOX) injection  30 mg Subcutaneous Q24H  . fenofibrate  160 mg Oral Daily  . gabapentin  300 mg Oral BID  . HYDROcodone-acetaminophen  1 tablet Oral BID  . insulin aspart  0-9 Units Subcutaneous 6 times per day  . [START ON 11/13/2013] levofloxacin (LEVAQUIN) IV  500 mg Intravenous Q48H  . metronidazole  500 mg  Intravenous 3 times per day  . multivitamin with minerals  1 tablet Oral Daily  . pantoprazole  40 mg Oral Daily  . PARoxetine  50 mg Oral QHS  . pindolol  5 mg Oral BID  . simvastatin  40 mg Oral q1800  . ascorbic acid  500 mg Oral QHS   PRN:  iohexol, promethazine, senna-docusate  Diet:  Cardiac thin liquids Activity:   DVT Prophylaxis:  Lovenox 30 mg sq daily   CLINICALLY SIGNIFICANT STUDIES Basic Metabolic Panel:   Recent Labs Lab 11/11/13 1922 11/11/13 1932  NA 145 144  K 5.0 4.7  CL 101 104  CO2 25  --   GLUCOSE 172* 175*  BUN 37* 36*  CREATININE 3.57* 3.90*  CALCIUM 9.8  --    Liver Function Tests:   Recent Labs Lab 11/11/13 1922  AST 91*  ALT 41*  ALKPHOS 44  BILITOT 0.4  PROT 8.0  ALBUMIN 3.9   CBC:   Recent Labs Lab 11/11/13 1922 11/11/13 1932  WBC 11.6*  --   NEUTROABS 8.1*  --   HGB 12.4 13.6  HCT 37.9 40.0  MCV 90.0  --   PLT 308  --    Coagulation:   Recent Labs Lab 11/11/13  1922  LABPROT 13.1  INR 1.01   Cardiac Enzymes: No results found for this basename: CKTOTAL, CKMB, CKMBINDEX, TROPONINI,  in the last 168 hours Urinalysis: No results found for this basename: COLORURINE, APPERANCEUR, LABSPEC, PHURINE, GLUCOSEU, HGBUR, BILIRUBINUR, KETONESUR, PROTEINUR, UROBILINOGEN, NITRITE, LEUKOCYTESUR,  in the last 168 hours Lipid Panel    Component Value Date/Time   CHOL 136 11/12/2013 0505   TRIG 374* 11/12/2013 0505   HDL 19* 11/12/2013 0505   CHOLHDL 7.2 11/12/2013 0505   VLDL 75* 11/12/2013 0505   LDLCALC 42 11/12/2013 0505   HgbA1C  Lab Results  Component Value Date   HGBA1C 8.0* 06/21/2012    Urine Drug Screen:   No results found for this basename: labopia,  cocainscrnur,  labbenz,  amphetmu,  thcu,  labbarb    Alcohol Level:   Recent Labs Lab 11/11/13 1922  ETH <11    CT Abdomen Pelvis  11/12/2013    No acute finding abdomen or pelvis.  Fatty infiltration of the liver.   CT of the brain  11/11/2013    No acute  intracranial abnormalities.  No interval change.   MRI of the brain  11/12/2013   1. No acute infarct or acute intracranial abnormality. Largely unremarkable for age non contrast MRI appearance of the brain.  2. Paranasal sinus inflammatory changes and increased mild mastoid effusions.    MRA of the brain  11/12/2013   .  Negative intracranial MRA.   2D Echocardiogram    Carotid Doppler  Bilateral: 1-39% ICA stenosis. Vertebral artery flow is antegrade. Bilateral tortuous ICAs with mild increase in velocities. No plaque formation noted to suggest stenosis.  CXR  11/11/2013    No acute disease.    EKG    Therapy Recommendations HH PT,   Physical Exam   Anxious middle aged Caucasian lady not in distress.Awake alert. Afebrile. Head is nontraumatic. Neck is supple without bruit. Hearing is normal. Cardiac exam no murmur or gallop. Lungs are clear to auscultation. Distal pulses are well felt. Neurological Exam : Awake alert oriented x 3 normal speech and language. Mild left lower face asymmetry. Tongue midline. No drift. Mild diminished fine finger movements on left. Orbits right over left upper extremity. ... Normal sensation . Normal coordination. ASSESSMENT [name] [name] is a 63 y.o. female presenting with slurred speech and left facial droop. Imaging confirms no acute infarct. Dx:  Right brain TIA.  On no regularly scheduled antithrombotics prior to admission. Now on aspirin 325 mg orally every day for secondary stroke prevention. . Stroke work up underway.  hypertension Hyperlipidemia, LDL 42, on zocor 40 mg daily PTA, now on pravachol 80 mg daily, at goal LDL < 70 for diabetics Diabetes, HgbA1c pending, goal < 7.0 CAD  Mild AS Hx CHF post op Hx ovarian cancer depression obstructive sleep apnea, has CPAP but does not use Chronic hepatitics C New onset renal failure since Dec  Hospital day # 1  TREATMENT/PLAN  Continue aspirin for secondary stroke prevention. Dr. Leonie [name] is  considering enrollment in the SOCRATES trial. New onset renal failure will likely preclude her participation  F/u 2D echo  therapy evals  Discussed with patient and son and answered questions Burnetta Sabin, MSN, RN, ANVP-BC, AGPCNP-BC [facility] Pager: [phone] 11/12/2013 10:55 AM  I have personally obtained a history, examined the patient, evaluated imaging results, and formulated the assessment and plan of care. I agree with the above.  Antony Contras, MD  To contact Stroke  Continuity provider, please refer to [email or web address] After hours, contact General Neurology

## 2013-11-12 NOTE — Progress Notes (Signed)
*  PRELIMINARY RESULTS* Vascular Ultrasound Carotid Duplex (Doppler) has been completed.  Preliminary findings: Bilateral:  1-39% ICA stenosis.  Vertebral artery flow is antegrade.    Bilateral tortuous ICAs with mild increase in velocities. No plaque formation noted to suggest stenosis.   Landry Mellow, RDMS, RVT  11/12/2013, 11:13 AM

## 2013-11-12 NOTE — Progress Notes (Signed)
Pt had a swallow screen done by ED nurse and pt originally passed, but pt later informed the nurse that she has history of esophageal strictures so the ED nurse failed the swallow test. Pt had an order for  CT abdomen and pelvis with oral contrast w/c the ED nurse gave to the pt while in ED. ED nurse claimed pt swallowed the dye without any difficulty. Pt arrived on the floor @ around 22:48 with the 2nd cup of oral contrast w/c she drank @ 2300 without difficulty. MD on call paged. Awaiting to call back. Report given to incoming nurse.

## 2013-11-12 NOTE — Evaluation (Signed)
Clinical/Bedside Swallow Evaluation Patient Details  Name: [name] [name] MRN: [id number] Date of Birth: 1951-02-04  Today's Date: 11/12/2013 Time: 5277-8242 SLP Time Calculation (min): 34 min  Past Medical History:  Past Medical History  Diagnosis Date  . Hyperlipidemia   . Benign neoplasm of stomach   . Dizziness and giddiness   . Gastroparesis   . Esophageal reflux   . CAD (coronary artery disease)     a.  cath 2005: LAD 40%;  b. Myoview 3/12: EF 70%, no ischemia;  c. Myoview 9/13: EF 76%, no ischemia  . HTN (hypertension)   . Palpitations   . Depression   . Aortic stenosis     Echo 8/13: EF 55-60%, Mild LVH, mild AS, mean gradient 12 mmHg,   . CHF (congestive heart failure) 06-13-12    episode after Cancer surgery  . PONV (postoperative nausea and vomiting)   . DM2 (diabetes mellitus, type 2) 06-13-12    NIDDM  . Ovarian cancer 06-13-12    surgery only x2-no recent problems  . Chronic hepatitis C without mention of hepatic coma 06-13-12    "states is active"  . Sleep apnea 06-13-12    does not use cpap, cpap setting of 2 when used cpap   Past Surgical History:  Past Surgical History  Procedure Laterality Date  . Tubal ligation    . Abdominal hysterectomy    . Oophorectomy      1 ovary removed, then later last ovary removed  . Tonsillectomy  age 40  . Bladder suspension  yrs ago    with posterior repair  . Knee arthroscopy      left  . Cataract extraction Bilateral 03/2013  . Breast biopsy  1972    left  . Abdominal hysterectomy  yrs ago  . Cholecystectomy  5 yrs ago  . Colonoscopy N/A 08/11/2013    Procedure: COLONOSCOPY;  Surgeon: Inda Castle, MD;  Location: [facility];  Service: Endoscopy;  Laterality: N/A;   HPI:  63 y.o. year old female with multiple medical problems DM, HLD, CAD, hep C, OSA on CPAP,  gastroparesis presenting with L sided hemiparesis. Pt is followed by Dr Deatra Ina for gastroparesis and hepatitis C. Last followup was one month ago.   Mild fevers and chills at home. Emesis and diarrhea has been nonbilious nonbloody.  CT head negative, CXR negative.  Abdomen CT negative.  Pt for brain MRI.  Pt failed an RN stroke swallow screen due to h/o dysphagia.  She has undergone esophageal dilatation approx six months ago per her report without improvement in symptoms.     Assessment / Plan / Recommendation Clinical Impression  Pt with mild dysarthria and absent palatal elevation or gag response but was able to consume po of cracker, applesauce and water without indications of airway compromise.  Pt does have xerostomia and SLP advised her to consider using Biotene if MD approves and consume plenty of liquids with intake.  Pt states she had more difficulty swallowing from straw today - ? if due to mild lingual weakness/discoordination- better tolerance of liquid via cup reported.    Audible swallow noted with liquids, ? due to CP function. Pt eats slowly which maximizes her airway protection.  She does have h/o oral candida s/p tx many years ago, gastroparesis and GERD which is managed by pt and GI MD (Dr Deatra Ina).    Recommend regular/thin diet with general aspiration/reflux precautions.       Aspiration Risk  Mild  Diet Recommendation Regular;Thin liquid   Liquid Administration via: Cup Medication Administration:  (as tolerated, defer to pt) Supervision: Patient able to self feed Compensations: Slow rate;Small sips/bites Postural Changes and/or Swallow Maneuvers: Seated upright 90 degrees;Upright 30-60 min after meal    Other  Recommendations Oral Care Recommendations: Oral care BID   Follow Up Recommendations  None (for dysphagia)    Frequency and Duration   n/a     Pertinent Vitals/Pain Afebrile,decreased      Swallow Study Prior Functional Status   see hhx     General Date of Onset: 11/12/13 HPI: 64 y.o. year old female with multiple medical problems DM, HLD, CAD, hep C, OSA on CPAP,  gastroparesis presenting with  L sided hemiparesis. Pt is followed by Dr Deatra Ina for gastroparesis and hepatitis C. Last followup was one month ago.  Mild fevers and chills at home. Emesis and diarrhea has been nonbilious nonbloody.  CT head negative, CXR negative.  Abdomen CT negative.  Pt for brain MRI.  Pt failed an RN stroke swallow screen due to h/o dysphagia.  She has undergone esophageal dilatation approx six months ago per her report without improvement in symptoms.   Type of Study: Bedside swallow evaluation Diet Prior to this Study: Regular Temperature Spikes Noted: No Respiratory Status: Room air History of Recent Intubation: No Behavior/Cognition: Alert;Cooperative (flat affect) Oral Cavity - Dentition: Adequate natural dentition Self-Feeding Abilities: Able to feed self (pt reports right hand numb - states earlier left hand was numb, able to hold spoon to feed self) Patient Positioning: Upright in bed Baseline Vocal Quality: Clear;Low vocal intensity Volitional Cough: Strong Volitional Swallow: Able to elicit (xerostomia noted)    Oral/Motor/Sensory Function Overall Oral Motor/Sensory Function: Impaired (slight dysarthria) Lingual ROM: Other (Comment) Lingual Symmetry: Other (Comment) Lingual Strength: Other (Comment) Lingual Sensation: Other (Comment) Facial ROM: Reduced left Velum:  (absent velar elevation, no sensation to palpation with spoon)   Ice Chips Ice chips: Not tested   Thin Liquid Thin Liquid: Within functional limits Presentation: Self Fed;Straw;Cup Other Comments: audible swallow    Nectar Thick Nectar Thick Liquid: Not tested   Honey Thick Honey Thick Liquid: Not tested   Puree Puree: Within functional limits Presentation: Self Fed;Spoon   Solid   GO    Solid: Within functional limits Presentation: Self Fed Other Comments: slow but effective mastication-minimal oral residuals cleared adequately       Claudie Fisherman, [place] [facility] SLP [phone]

## 2013-11-12 NOTE — Evaluation (Signed)
Occupational Therapy Evaluation Patient Details Name: [name] [name] MRN: [id number] DOB: 10/18/1950 Today's Date: 11/12/2013 Time: 8676-7209 OT Time Calculation (min): 31 min  OT Assessment / Plan / Recommendation History of present illness pt presents with L sided weakness and numbness.     Clinical Impression   Pt presents with below problem list. Education provided during session to pt and family. Feel pt will benefit from acute OT to increase independence prior to d/c.     OT Assessment  Patient needs continued OT Services    Follow Up Recommendations  Home health OT;Supervision/Assistance - 24 hour    Barriers to Discharge      Equipment Recommendations  Other (comment) (AE )    Recommendations for Other Services    Frequency  Min 2X/week    Precautions / Restrictions Precautions Precautions: Fall Precaution Comments: Hx of falls at home and per pt and family has been shaking for ~2-67months.   Restrictions Weight Bearing Restrictions: No   Pertinent Vitals/Pain Pain in left knee. Increased activity and nurse notified.     ADL  Grooming: Wash/dry hands;Supervision/safety Where Assessed - Grooming: Supported standing Upper Body Dressing: Set up Where Assessed - Upper Body Dressing: Supported sitting Lower Body Dressing: Minimal assistance Where Assessed - Lower Body Dressing: Supported sit to Lobbyist: Magazine features editor Method: Sit to Loss adjuster, chartered: Therapist, occupational and Hygiene: Min guard Where Assessed - Best boy and Hygiene: Sit to stand from 3-in-1 or toilet Tub/Shower Transfer Method: Not assessed Equipment Used: Gait belt;Rolling walker;Reacher;Long-handled sponge;Long-handled shoe horn;Sock aid Transfers/Ambulation Related to ADLs: Min guard ADL Comments: Educated on AE for LB ADLs-pt had difficulty donning right sock. Practiced with sockaid. Educated on dressing  technique. Recommended sitting for bathing and dressing. Educated pt/family on signs/symptoms of stroke and importance of getting help right away. Recommended to avoid canned foods.  Discussed safe shoe wear as well as keeping items in basket/bag on walker and not carrying them. Spoke with son and recommended picking up clutter in house if any and also discussed rugs in house.    OT Diagnosis: Generalized weakness;Acute pain  OT Problem List: Decreased strength;Impaired balance (sitting and/or standing);Decreased knowledge of use of DME or AE;Decreased knowledge of precautions;Decreased cognition;Pain;Impaired sensation OT Treatment Interventions: Self-care/ADL training;DME and/or AE instruction;Therapeutic activities;Patient/family education;Balance training;Cognitive remediation/compensation   OT Goals(Current goals can be found in the care plan section) Acute Rehab OT Goals Patient Stated Goal: not stated OT Goal Formulation: With patient Time For Goal Achievement: 11/19/13 Potential to Achieve Goals: Good ADL Goals Pt Will Perform Lower Body Bathing: with supervision;with adaptive equipment;sit to/from stand Pt Will Perform Lower Body Dressing: with supervision;sit to/from stand;with adaptive equipment Pt Will Transfer to Toilet: with supervision;ambulating (3 in 1 over commode) Pt Will Perform Toileting - Clothing Manipulation and hygiene: with modified independence;sit to/from stand Pt Will Perform Tub/Shower Transfer: Tub transfer;with supervision;ambulating;tub bench;rolling walker  Visit Information  Last OT Received On: 11/12/13 Assistance Needed: +1 History of Present Illness: pt presents with L sided weakness and numbness.         Prior [place] expects to be discharged to:: Private residence Living Arrangements: Children Available Help at Discharge: Family (alone at times) Type of Home: House Home Access: Stairs to enter [employer] of Steps: few Entrance Stairs-Rails: Right;Left Home Layout: One level Home Equipment: [place] - 4 wheels;Walker - 2 wheels;Bedside commode;Tub bench;Toilet riser Prior Function Level  of Independence: Needs assistance Gait / Transfers Assistance Needed: No AD, but states has had multiple falls.   ADL's / Homemaking Assistance Needed: Family performs homemaking tasks, pt I with ADLs.   Communication / Swallowing Assistance Needed: Hx of Esophageal Stricture with SLP to assess.   Communication Communication: No difficulties         Vision/Perception Vision - History Baseline Vision: Wears glasses all the time Patient Visual Report: Blurring of vision Vision - Assessment Vision Assessment: Vision tested Visual Fields: No apparent deficits   Cognition  Cognition Arousal/Alertness: Awake/alert Behavior During Therapy: Flat affect Overall Cognitive Status: History of cognitive impairments - at baseline (pt with memory deficits at baseline) Memory: Decreased short-term memory    Extremity/Trunk Assessment Upper Extremity Assessment Upper Extremity Assessment: Overall WFL for tasks assessed; reports decreased sensation in hands at times. Lower Extremity Assessment Lower Extremity Assessment: Defer to PT evaluation     Mobility Bed Mobility Overal bed mobility: Needs Assistance Bed Mobility: Supine to Sit Supine to sit: Supervision General bed mobility comments: supervision for safety. Transfers Overall transfer level: Needs assistance Transfers: Sit to/from Stand Sit to Stand: Min guard General transfer comment: cues for hand placement.     Exercise     Balance     End of Session OT - End of Session Equipment Utilized During Treatment: Gait belt;Rolling walker;Oxygen Activity Tolerance: Patient tolerated treatment well Patient left: in chair;with call bell/phone within reach;with family/visitor present Nurse Communication: Mobility status  [place], [place] OTR/L [phone] 11/12/2013, 4:07 PM

## 2013-11-12 NOTE — ED Notes (Signed)
During swallow screen I asked patient if she had a history of being in the hospital for trouble swallowing and/or ever had to have her diet thickened. Pt said no to this question. After patient finished eating cracker, pts family member mentioned patient having a history of having her esophagus stretched. I asked why and patient reported that she had trouble swallowing. Will re-do stroke swallow screen.

## 2013-11-12 NOTE — Progress Notes (Signed)
*  PRELIMINARY RESULTS* Echocardiogram 2D Echocardiogram has been performed.  Leavy Cella 11/12/2013, 12:03 PM

## 2013-11-12 NOTE — Progress Notes (Signed)
[facility] PROGRESS NOTE  [name] [name] [id number] DOB: May 03, 1951 DOA: 11/11/2013 PCP: Vicenta Aly, FNP  Assessment/Plan: Acute kidney injury -Secondary to dehydration -Hold furosemide -Hold lisinopril -IV fluids -BMP in the morning Left hemiparesis -Noncompliant with aspirin -MRI brain negative for acute stroke -MRA brain negative -Echocardiogram EF [id number], grade 1 diastolic dysfunction Leukocytosis -Likely due to acute medical illness -CT abdomen and pelvis negative for acute findings; shows fatty liver  -CBC in the morning  Diabetes mellitus type 2  -Hemoglobin A1c 7.5  -Continue NovoLog sliding scale  -Discontinue metformin and Amaryl  Vomiting and diarrhea  -Has been a chronic problem  -08/11/2013 colonoscopy normal  -Improved  -Discontinue Levaquin and Flagyl    Family Communication:   son at beside Disposition Plan:   Home when medically stable       Procedures/Studies: Ct Abdomen Pelvis Wo Contrast  11/12/2013   CLINICAL DATA:  Vomiting and diarrhea.  EXAM: CT ABDOMEN AND PELVIS WITHOUT CONTRAST  TECHNIQUE: Multidetector CT imaging of the abdomen and pelvis was performed following the standard protocol without intravenous contrast.  COMPARISON:  CT abdomen and pelvis 03/18/2007.  FINDINGS: Mild linear atelectasis or scarring in the lung bases is noted. No pleural or pericardial effusion.  The patient is status post cholecystectomy. There is fatty infiltration of the liver without focal lesion. Splenic calcifications are consistent with old granulomatous disease and unchanged. The adrenal glands, pancreas and kidneys appear normal. The stomach, small and large bowel and appendix appear normal. No lymphadenopathy or fluid. No lytic or sclerotic bony lesion.  IMPRESSION: No acute finding abdomen or pelvis.  Fatty infiltration of the liver.   Electronically Signed   By: Inge Rise M.D.   On: 11/12/2013 01:29   Dg Chest 2 View  11/11/2013    CLINICAL DATA:  Cough and shortness of breath.  EXAM: CHEST  2 VIEW  COMPARISON:  PA and lateral chest 04/10/2013.  FINDINGS: Lungs are clear. Heart size is normal. No pneumothorax or pleural effusion. Asymmetric elevation of the right hemidiaphragm relative to the left is unchanged.  IMPRESSION: No acute disease.   Electronically Signed   By: Inge Rise M.D.   On: 11/11/2013 22:37   Ct Head Wo Contrast  11/11/2013   CLINICAL DATA:  Difficulty speaking, facial droop, history coronary artery disease, hypertension, diabetes, ovarian cancer  EXAM: CT HEAD WITHOUT CONTRAST  TECHNIQUE: Contiguous axial images were obtained from the base of the skull through the vertex without intravenous contrast.  COMPARISON:  01/30/2010  FINDINGS: Normal ventricular morphology.  No midline shift or mass effect.  Normal appearance of brain parenchyma.  No intracranial hemorrhage, mass lesion or evidence acute infarction.  No extra-axial fluid collections.  Bones and sinuses unremarkable.  IMPRESSION: No acute intracranial abnormalities.  No interval change.  Findings called to Dr. Nicole Kindred on 11/11/2013 at 1933 hr.   Electronically Signed   By: Lavonia Dana M.D.   On: 11/11/2013 19:33   Mr Brain Wo Contrast  11/12/2013   CLINICAL DATA:  63 year old female with slurred speech vomiting diarrhea and left hemiparesis. Initial encounter.  EXAM: MRI HEAD WITHOUT CONTRAST  MRA HEAD WITHOUT CONTRAST  TECHNIQUE: Multiplanar, multiecho pulse sequences of the brain and surrounding structures were obtained without intravenous contrast. Angiographic images of the head were obtained using MRA technique without contrast.  COMPARISON:  Head CTs without contrast 11/11/2013 and earlier. Brain MRI 03/01/2005.  FINDINGS: MRI HEAD FINDINGS  No restricted diffusion or evidence of acute infarction. Major intracranial vascular  flow voids are stable.  No cortical encephalomalacia. Scattered minimal to mild for age nonspecific cerebral white matter T2  and FLAIR hyperintensity. Small right superior cerebellar developmental venous anomaly suspected. No midline shift, mass effect, evidence of mass lesion, ventriculomegaly, extra-axial collection or acute intracranial hemorrhage. Cervicomedullary junction and pituitary are within normal limits. Negative visualized upper cervical spine.  Interval postoperative changes to the globes. New moderate sphenoid and right maxillary sinus mucosal thickening and opacification. Small bilateral mastoid effusions have increased. Negative visualized nasopharynx. Visible internal auditory structures appear normal. Visualized scalp soft tissues are within normal limits. Stable bone marrow signal, within normal limits.  MRA HEAD FINDINGS  Antegrade flow in the posterior circulation with mildly dominant distal left vertebral artery. Normal PICA origins. Normal vertebrobasilar junction. Dominant right AICA. No basilar stenosis. SCA and PCA origins are normal. Normal right posterior communicating artery, the left is diminutive or absent. Bilateral PCA branches are within normal limits.  Antegrade flow in both ICA siphons. No ICA stenosis. Ophthalmic and right posterior communicating artery origins are normal. Small left supra clinoid ICA infundibulum suspected. Normal carotid termini, MCA and ACA origins. Anterior communicating artery and visualized ACA branches are within normal limits. Left MCA branches are within normal limits. Right MCA M1 segment is normal. Right MCA bifurcation and visualized right MCA branches are normal.  IMPRESSION: 1. No acute infarct or acute intracranial abnormality. Largely unremarkable for age non contrast MRI appearance of the brain. 2.  Negative intracranial MRA. 3. Paranasal sinus inflammatory changes and increased mild mastoid effusions.   Electronically Signed   By: Lars Pinks M.D.   On: 11/12/2013 11:14   Mr Jodene Nam Head/brain Wo Cm  11/12/2013   CLINICAL DATA:  63 year old female with slurred speech  vomiting diarrhea and left hemiparesis. Initial encounter.  EXAM: MRI HEAD WITHOUT CONTRAST  MRA HEAD WITHOUT CONTRAST  TECHNIQUE: Multiplanar, multiecho pulse sequences of the brain and surrounding structures were obtained without intravenous contrast. Angiographic images of the head were obtained using MRA technique without contrast.  COMPARISON:  Head CTs without contrast 11/11/2013 and earlier. Brain MRI 03/01/2005.  FINDINGS: MRI HEAD FINDINGS  No restricted diffusion or evidence of acute infarction. Major intracranial vascular flow voids are stable.  No cortical encephalomalacia. Scattered minimal to mild for age nonspecific cerebral white matter T2 and FLAIR hyperintensity. Small right superior cerebellar developmental venous anomaly suspected. No midline shift, mass effect, evidence of mass lesion, ventriculomegaly, extra-axial collection or acute intracranial hemorrhage. Cervicomedullary junction and pituitary are within normal limits. Negative visualized upper cervical spine.  Interval postoperative changes to the globes. New moderate sphenoid and right maxillary sinus mucosal thickening and opacification. Small bilateral mastoid effusions have increased. Negative visualized nasopharynx. Visible internal auditory structures appear normal. Visualized scalp soft tissues are within normal limits. Stable bone marrow signal, within normal limits.  MRA HEAD FINDINGS  Antegrade flow in the posterior circulation with mildly dominant distal left vertebral artery. Normal PICA origins. Normal vertebrobasilar junction. Dominant right AICA. No basilar stenosis. SCA and PCA origins are normal. Normal right posterior communicating artery, the left is diminutive or absent. Bilateral PCA branches are within normal limits.  Antegrade flow in both ICA siphons. No ICA stenosis. Ophthalmic and right posterior communicating artery origins are normal. Small left supra clinoid ICA infundibulum suspected. Normal carotid termini,  MCA and ACA origins. Anterior communicating artery and visualized ACA branches are within normal limits. Left MCA branches are within normal limits. Right MCA M1 segment is normal. Right MCA bifurcation  and visualized right MCA branches are normal.  IMPRESSION: 1. No acute infarct or acute intracranial abnormality. Largely unremarkable for age non contrast MRI appearance of the brain. 2.  Negative intracranial MRA. 3. Paranasal sinus inflammatory changes and increased mild mastoid effusions.   Electronically Signed   By: Lars Pinks M.D.   On: 11/12/2013 11:14         Subjective: Patient is feeling better today. No further vomiting or diarrhea. The patient was able to eat a hamburger and fries. Denies any chest pain, shortness breath, nausea, vomiting, diarrhea, abdominal pain, dysuria, hematuria.   Objective: Filed Vitals:   11/12/13 0400 11/12/13 0549 11/12/13 1309 11/12/13 1534  BP: 108/66 100/57 102/73 100/66  Pulse: 102 99 96 92  Temp: 97.7 F (36.5 C) 99.5 F (37.5 C)  98.3 F (36.8 C)  TempSrc: Oral Oral  Oral  Resp: 20 20 18 20   Height:      Weight:      SpO2: 95% 94% 97% 98%    Intake/Output Summary (Last 24 hours) at 11/12/13 1657 Last data filed at 11/12/13 1300  Gross per 24 hour  Intake    200 ml  Output    400 ml  Net   -200 ml   Weight change:  Exam:   General:  Pt is alert, follows commands appropriately, not in acute distress  HEENT: No icterus, No thrush,[place]/AT  Cardiovascular: RRR, S1/S2, no rubs, no gallops  Respiratory: left basilar crackles. Right to auscultation   Abdomen: Soft/+BS, non tender, non distended, no guarding  Extremities: No edema, No lymphangitis, No petechiae, No rashes, no synovitis  Data Reviewed: Basic Metabolic Panel:  Recent Labs Lab 11/11/13 1922 11/11/13 1932  NA 145 144  K 5.0 4.7  CL 101 104  CO2 25  --   GLUCOSE 172* 175*  BUN 37* 36*  CREATININE 3.57* 3.90*  CALCIUM 9.8  --    Liver Function  Tests:  Recent Labs Lab 11/11/13 1922  AST 91*  ALT 41*  ALKPHOS 44  BILITOT 0.4  PROT 8.0  ALBUMIN 3.9   No results found for this basename: LIPASE, AMYLASE,  in the last 168 hours No results found for this basename: AMMONIA,  in the last 168 hours CBC:  Recent Labs Lab 11/11/13 1922 11/11/13 1932  WBC 11.6*  --   NEUTROABS 8.1*  --   HGB 12.4 13.6  HCT 37.9 40.0  MCV 90.0  --   PLT 308  --    Cardiac Enzymes: No results found for this basename: CKTOTAL, CKMB, CKMBINDEX, TROPONINI,  in the last 168 hours BNP: No components found with this basename: POCBNP,  CBG:  Recent Labs Lab 11/11/13 1913 11/12/13 0030 11/12/13 0412 11/12/13 1259 11/12/13 1638  GLUCAP 163* 149* 144* 125* 172*    No results found for this or any previous visit (from the past 240 hour(s)).   Scheduled Meds: . ARIPiprazole  5 mg Oral q morning - 10a  . aspirin  300 mg Rectal Daily   Or  . aspirin  325 mg Oral Daily  . cycloSPORINE  1 drop Both Eyes BID  . darifenacin  7.5 mg Oral Daily  . enoxaparin (LOVENOX) injection  30 mg Subcutaneous Q24H  . fenofibrate  160 mg Oral Daily  . gabapentin  300 mg Oral BID  . HYDROcodone-acetaminophen  1 tablet Oral BID  . insulin aspart  0-9 Units Subcutaneous 6 times per day  . multivitamin with minerals  1  tablet Oral Daily  . pantoprazole  40 mg Oral Daily  . PARoxetine  50 mg Oral QHS  . pindolol  5 mg Oral BID  . simvastatin  40 mg Oral q1800  . ascorbic acid  500 mg Oral QHS   Continuous Infusions:    [name] Coltrane, DO  [facility] Pager [phone]  If 7PM-7AM, please contact night-coverage [email or web address] Password [email or web address] 11/12/2013, 4:57 PM   LOS: 1 day

## 2013-11-13 DIAGNOSIS — G459 Transient cerebral ischemic attack, unspecified: Principal | ICD-10-CM

## 2013-11-13 DIAGNOSIS — J9610 Chronic respiratory failure, unspecified whether with hypoxia or hypercapnia: Secondary | ICD-10-CM

## 2013-11-13 LAB — BASIC METABOLIC PANEL
BUN: 28 mg/dL — ABNORMAL HIGH (ref 6–23)
CHLORIDE: 104 meq/L (ref 96–112)
CO2: 23 mEq/L (ref 19–32)
Calcium: 8.7 mg/dL (ref 8.4–10.5)
Creatinine, Ser: 1.48 mg/dL — ABNORMAL HIGH (ref 0.50–1.10)
GFR calc Af Amer: 43 mL/min — ABNORMAL LOW (ref >90)
GFR, EST NON AFRICAN AMERICAN: 37 mL/min — AB (ref >90)
GLUCOSE: 265 mg/dL — AB (ref 70–99)
POTASSIUM: 4.3 meq/L (ref 3.7–5.3)
SODIUM: 140 meq/L (ref 137–147)

## 2013-11-13 LAB — URINE CULTURE

## 2013-11-13 LAB — CBC
HCT: 31.1 % — ABNORMAL LOW (ref 36.0–46.0)
Hemoglobin: 10.1 g/dL — ABNORMAL LOW (ref 12.0–15.0)
MCH: 29.6 pg (ref 26.0–34.0)
MCHC: 32.5 g/dL (ref 30.0–36.0)
MCV: 91.2 fL (ref 78.0–100.0)
PLATELETS: 169 10*3/uL (ref 150–400)
RBC: 3.41 MIL/uL — ABNORMAL LOW (ref 3.87–5.11)
RDW: 13.4 % (ref 11.5–15.5)
WBC: 4.8 10*3/uL (ref 4.0–10.5)

## 2013-11-13 LAB — CK: Total CK: 406 U/L — ABNORMAL HIGH (ref 7–177)

## 2013-11-13 LAB — GLUCOSE, CAPILLARY: GLUCOSE-CAPILLARY: 150 mg/dL — AB (ref 70–99)

## 2013-11-13 MED ORDER — ACETAMINOPHEN 325 MG PO TABS
650.0000 mg | ORAL_TABLET | Freq: Four times a day (QID) | ORAL | Status: DC | PRN
  Administered 2013-11-13: 650 mg via ORAL
  Filled 2013-11-13: qty 2

## 2013-11-13 MED ORDER — ASPIRIN 325 MG PO TABS: 325.0000 mg | ORAL_TABLET | Freq: Every day | ORAL | Status: DC

## 2013-11-13 NOTE — Progress Notes (Signed)
Stroke Team Progress Note  HISTORY [name] [name] is a 64 y.o. female with diabetes mellitus, hypertension, coronary disease, and hyperlipidemia as well as depression, presenting with slurred speech and left facial droop as well as left-sided weakness. Patient was last seen well at 5:30 PM 11/11/2013. She was noted to be dragging her left leg and having slightly slurred speech with facial droop at about 6:30 PM. There is no previous history of stroke nor TIA. Patient's been on aspirin but has not been taking aspirin on a daily basis. CT scan of her head showed no acute intracranial abnormality. NIH stroke score was 2.  Patient was not administerd TPA secondary to minimal deficits. She was admitted for further evaluation and treatment.  SUBJECTIVE Family member at bedside. [name] told her patient that this may have been a TIA possibly secondary to dehydration in the setting of recent diarrhea. The patient has a history of irritable bowel syndrome and has been evaluated by Dr. Deatra Ina for her diarrhea. The patient's facial droop and dysarthria are much improved.  OBJECTIVE Most recent Vital Signs: Filed Vitals:   11/12/13 1534 11/12/13 1830 11/12/13 2055 11/13/13 1050  BP: 100/66 107/62 120/65 113/63  Pulse: 92 94 95 86  Temp: 98.3 F (36.8 C) 98 F (36.7 C) 99.2 F (37.3 C) 98.1 F (36.7 C)  TempSrc: Oral Oral Oral Oral  Resp: 20 20 18 20   Height:      Weight:      SpO2: 98% 99% 92% 100%   CBG (last 3)   Recent Labs  11/12/13 1638 11/12/13 2109 11/13/13 1143  GLUCAP 172* 195* 150*    IV Fluid Intake:   . sodium chloride 75 mL/hr at 11/12/13 2159    MEDICATIONS  . ARIPiprazole  5 mg Oral q morning - 10a  . aspirin  300 mg Rectal Daily   Or  . aspirin  325 mg Oral Daily  . cycloSPORINE  1 drop Both Eyes BID  . darifenacin  7.5 mg Oral Daily  . enoxaparin (LOVENOX) injection  30 mg Subcutaneous Q24H  . fenofibrate  160 mg Oral Daily  . gabapentin  300 mg Oral BID  .  HYDROcodone-acetaminophen  1 tablet Oral BID  . insulin aspart  0-9 Units Subcutaneous 6 times per day  . multivitamin with minerals  1 tablet Oral Daily  . pantoprazole  40 mg Oral Daily  . PARoxetine  50 mg Oral QHS  . pindolol  5 mg Oral BID  . ascorbic acid  500 mg Oral QHS   PRN:  acetaminophen, promethazine, senna-docusate  Diet:  Cardiac thin liquids Activity:   DVT Prophylaxis:  Lovenox 30 mg sq daily   CLINICALLY SIGNIFICANT STUDIES Basic Metabolic Panel:   Recent Labs Lab 11/11/13 1922 11/11/13 1932 11/13/13 0925  NA 145 144 140  K 5.0 4.7 4.3  CL 101 104 104  CO2 25  --  23  GLUCOSE 172* 175* 265*  BUN 37* 36* 28*  CREATININE 3.57* 3.90* 1.48*  CALCIUM 9.8  --  8.7   Liver Function Tests:   Recent Labs Lab 11/11/13 1922  AST 91*  ALT 41*  ALKPHOS 44  BILITOT 0.4  PROT 8.0  ALBUMIN 3.9   CBC:   Recent Labs Lab 11/11/13 1922 11/11/13 1932 11/13/13 0600  WBC 11.6*  --  4.8  NEUTROABS 8.1*  --   --   HGB 12.4 13.6 10.1*  HCT 37.9 40.0 31.1*  MCV 90.0  --  91.2  PLT 308  --  169   Coagulation:   Recent Labs Lab 11/11/13 1922  LABPROT 13.1  INR 1.01   Cardiac Enzymes:   Recent Labs Lab 11/13/13 0925  CKTOTAL 406*   Urinalysis:   Recent Labs Lab 11/12/13 1600  COLORURINE YELLOW  LABSPEC 1.020  PHURINE 5.0  GLUCOSEU NEGATIVE  HGBUR NEGATIVE  BILIRUBINUR NEGATIVE  KETONESUR NEGATIVE  PROTEINUR NEGATIVE  UROBILINOGEN 0.2  NITRITE NEGATIVE  LEUKOCYTESUR MODERATE*   Lipid Panel    Component Value Date/Time   CHOL 136 11/12/2013 0505   TRIG 374* 11/12/2013 0505   HDL 19* 11/12/2013 0505   CHOLHDL 7.2 11/12/2013 0505   VLDL 75* 11/12/2013 0505   LDLCALC 42 11/12/2013 0505   HgbA1C  Lab Results  Component Value Date   HGBA1C 7.5* 11/12/2013    Urine Drug Screen:      Component Value Date/Time   LABOPIA NONE DETECTED 11/12/2013 1600    Alcohol Level:   Recent Labs Lab 11/11/13 1922  ETH <11    CT Abdomen Pelvis   11/12/2013    No acute finding abdomen or pelvis.  Fatty infiltration of the liver.   CT of the brain  11/11/2013    No acute intracranial abnormalities.  No interval change.   MRI of the brain  11/12/2013   1. No acute infarct or acute intracranial abnormality. Largely unremarkable for age non contrast MRI appearance of the brain.  2. Paranasal sinus inflammatory changes and increased mild mastoid effusions.    MRA of the brain  11/12/2013   .  Negative intracranial MRA.   2D Echocardiogram  Ejection fraction 60-65%. No cardiac source of emboli was identified.  Carotid Doppler  Bilateral: 1-39% ICA stenosis. Vertebral artery flow is antegrade. Bilateral tortuous ICAs with mild increase in velocities. No plaque formation noted to suggest stenosis.  CXR  11/11/2013    No acute disease.    EKG  Sinus rhythm rate 90 beats per minute. Please refer to the formal reading for complete details.  Therapy Recommendations HH PT,   Physical Exam   Anxious middle aged Caucasian lady not in distress.Awake alert. Afebrile. Head is nontraumatic. Neck is supple without bruit. Hearing is normal. Cardiac exam no murmur or gallop. Lungs are clear to auscultation. Distal pulses are well felt. Neurological Exam : Awake alert oriented x 3 normal speech and language. Mild left lower face asymmetry. Tongue midline. No drift. Mild diminished fine finger movements on left. Orbits right over left upper extremity. ... Normal sensation . Normal coordination. ASSESSMENT Ms. [name] [name] is a 63 y.o. female presenting with slurred speech and left facial droop. Imaging confirms no acute infarct. Dx:  Right brain TIA.  On no regularly scheduled antithrombotics prior to admission. Now on aspirin 325 mg orally every day for secondary stroke prevention.  Stroke work up completed.  Hypertension / hypotension Hyperlipidemia, LDL 42, on zocor 40 mg daily PTA, now on pravachol 80 mg daily, at goal LDL < 70 for diabetics Diabetes,  HgbA1c pending, goal < 7.0 CAD  Mild AS Hx CHF post op Hx ovarian cancer Depression Obstructive sleep apnea, has CPAP but does not use Chronic hepatitics C New onset renal failure since Dec - BUN 28 creatinine 1.48 on 11/13/2013.  Hospital day # 2  TREATMENT/PLAN  Continue aspirin for secondary stroke prevention. [name] has decided against Socrates Trial enrollment.  Therapy evals. - home health physical and occupational therapies with 24-hour per day supervision  recommended.  Followup [name] 6 weeks.  OK for discharge from stroke standpoint.   Mikey Bussing PA-C [facility] Pager [phone] 11/13/2013, 11:53 AM  I have personally obtained a history, examined the patient, evaluated imaging results, and formulated the assessment and plan of care. I agree with the above. Antony Contras, MD   To contact Stroke Continuity provider, please refer to [email or web address] After hours, contact General Neurology

## 2013-11-13 NOTE — Discharge Summary (Signed)
Physician Discharge Summary  [name] [name] Legacy TFT:[id number] DOB: 1951-03-22 DOA: 11/11/2013  PCP: Vicenta Aly, FNP  Admit date: 11/11/2013 Discharge date: 11/13/2013  Recommendations for Outpatient Follow-up:  1. Pt will need to follow up with PCP in 1 week  post discharge 2. Please obtain BMP on 11/17/13 3. Please also check CBC 11/17/13 4. Repeat CPK   Discharge Diagnoses:  Active Problems:   DIABETES MELLITUS   CVA (cerebral infarction)   Hemiparesis   AKI (acute kidney injury)   Dehydration Acute kidney injury  -Secondary to dehydration  -Hold furosemide  -Hold lisinopril  -IV fluids  -Serum creatinine 1.48 on the day of discharge -The patient was instructed to increase intake of oral fluids at home -The patient's meloxicam will be discontinued in light of the patient's acute kidney injury -Patient was instructed to follow with primary care provider for repeat BMP before restarting furosemide, lisinopril, meloxicam Left hemiparesis/TIA  -Noncompliant with aspirin at home -MRI brain negative for acute stroke  -MRA brain negative  -Echocardiogram EF 70-62%, grade 1 diastolic dysfunction  -neurology was consulted and felt pt had TIA-->recommended continue ASA 325mg  daily -Carotid ultrasound negative for hemodynamically significant lesion Leukocytosis  -Likely due to acute medical illness-->improved--WBC 4.8 on day of d/c  -CT abdomen and pelvis negative for acute findings; shows fatty liver  Diabetes mellitus type 2  -Hemoglobin A1c 7.5  -Continue NovoLog sliding scale  -Discontinue metformin--given the patient's renal insufficiency, I will not restart metformin at this time -Patient was instructed to followup with her primary care provider to recheck her labs/BMP prior to consideration for restarting metformin -The patient can restart Amaryl at home Vomiting and diarrhea  -Has been a chronic problem  -08/11/2013 colonoscopy normal  -Improved  -Discontinue Levaquin and  Flagyl  Hyperlipidemia  -Patient had a mildly elevated CPK--406 -patient was instructed to discontinue Zocor at this time until she follows up with her primary care provider for repeat blood work -her primary care provider will instruct the patient as to the optimal time to restart Zocor Chronic respiratory failure -The patient remained stable on 2 L nasal cannula (usual home dose) -The patient will be discharged home on her usual 2 L nasal cannula. Family Communication: son at beside  Disposition Plan: Home when medically stable   Discharge Condition: Stable  Disposition:  home  Diet: Carbohydrate modified Wt Readings from Last 3 Encounters:  11/11/13 87.59 kg (193 lb 1.6 oz)  09/19/13 87.635 kg (193 lb 3.2 oz)  07/10/13 87.601 kg (193 lb 2 oz)    History of present illness:  63 y.o. year old female with multiple medical problems DM, HLD, CAD, hep C, OSA on CPAP, gastroparesis presenting with L sided hemiparesis. Family members they said they witnessed patient dragging her left leg over the course of the day. Also witnessed patient dropping a plate out of her left arm with left-sided facial drooping. Has been intermittently compliant with aspirin at home Patient was then emergently brought to the ER for further evaluation. A code stroke was called. Initial head CT was within normal limits. Initial NIH stroke score of 2. Neurology consulted with recommendation for inpatient stroke workup. Patient did have some lab abnormalities including a white blood cell count of 11.6 as well as a creatinine of 3.57 with a baseline creatinine of one. Also with tmax 99.1 and BP in 90s. Patient reports that she's had recurrent episodes of vomiting and diarrhea over the past 2 days. No fevers or chills. This has been somewhat  of an acute on chronic issue. Is followed by Dr. Deatra Ina for gastroparesis and hepatitis C. Last followup was one month ago. No medication changes at that time. Mild fevers and chills at  home. Emesis and diarrhea has been nonbilious nonbloody.  Also seen by Dr. Lamonte Sakai w/ pulm in January 2013 for dyspnea and hypoxemia. Recommendation was for restarting CPAP. Patient has been intermittently compliant with this. Does wear 2 L of oxygen at home at times. Feels like she has required more oxygen at home The patient was started on intravenous fluids. Her renal function improved. On the day of discharge, serum creatinine was 1.48. Although this was not completely back to her baseline, there was significant improvement. The patient was able to increase oral intake. She was instructed to increase fluid intake at home. She was instructed to followup with primary care provider for repeat BMP.     Consultants: neurology  Discharge Exam: Filed Vitals:   11/13/13 1050  BP: 113/63  Pulse: 86  Temp: 98.1 F (36.7 C)  Resp: 20   Filed Vitals:   11/12/13 1534 11/12/13 1830 11/12/13 2055 11/13/13 1050  BP: 100/66 107/62 120/65 113/63  Pulse: 92 94 95 86  Temp: 98.3 F (36.8 C) 98 F (36.7 C) 99.2 F (37.3 C) 98.1 F (36.7 C)  TempSrc: Oral Oral Oral Oral  Resp: 20 20 18 20   Height:      Weight:      SpO2: 98% 99% 92% 100%   General: A&O x 3, NAD, pleasant, cooperative Cardiovascular: RRR, no rub, no gallop, no S3 Respiratory: Left basal crackles. Rectal to auscultation. No wheezing. Good air movement. Abdomen:soft, nontender, nondistended, positive bowel sounds Extremities: trace LE edema, No lymphangitis, no petechiae  Discharge Instructions      Discharge Orders   Future Orders Complete By Expires   Diet - low sodium heart healthy  As directed    Discharge instructions  As directed    Comments:     Stop taking furosemide, pravachol, metformin, mobic, and lisinopril until you follow up with your primary care doctor and he/she instructs you to restart these medications   Increase activity slowly  As directed        Medication List    STOP taking these medications        furosemide 40 MG tablet  Commonly known as:  LASIX     lisinopril 20 MG tablet  Commonly known as:  PRINIVIL,ZESTRIL     meloxicam 15 MG tablet  Commonly known as:  MOBIC     metFORMIN 500 MG tablet  Commonly known as:  GLUCOPHAGE     pravastatin 80 MG tablet  Commonly known as:  PRAVACHOL      TAKE these medications       ABILIFY 5 MG tablet  Generic drug:  ARIPiprazole  Take 5 mg by mouth every morning.     ascorbic acid 500 MG tablet  Commonly known as:  VITAMIN C  Take 500 mg by mouth at bedtime.     aspirin 325 MG tablet  Take 1 tablet (325 mg total) by mouth daily.     DEXILANT 60 MG capsule  Generic drug:  dexlansoprazole  Take 60 mg by mouth every morning.     fenofibrate 145 MG tablet  Commonly known as:  TRICOR  Take 145 mg by mouth daily.     gabapentin 300 MG capsule  Commonly known as:  NEURONTIN  Take 300 mg by mouth 2 (two)  times daily.     glimepiride 4 MG tablet  Commonly known as:  AMARYL  Take 4 mg by mouth every morning.     HYDROcodone-acetaminophen 7.5-325 MG per tablet  Commonly known as:  NORCO  Take 1 tablet by mouth 2 (two) times daily.     multivitamin with minerals Tabs tablet  Take 1 tablet by mouth daily.     nitroGLYCERIN 0.4 MG SL tablet  Commonly known as:  NITROSTAT  Place 0.4 mg under the tongue every 5 (five) minutes as needed for chest pain.     PARoxetine 25 MG 24 hr tablet  Commonly known as:  PAXIL-CR  Take 50 mg by mouth at bedtime.     pindolol 5 MG tablet  Commonly known as:  VISKEN  Take 5 mg by mouth 2 (two) times daily.     promethazine 25 MG tablet  Commonly known as:  PHENERGAN  Take 25 mg by mouth every 6 (six) hours as needed for nausea or vomiting.     RESTASIS 0.05 % ophthalmic emulsion  Generic drug:  cycloSPORINE  Place 1 drop into both eyes 2 (two) times daily.     riboflavin 100 MG Tabs tablet  Commonly known as:  VITAMIN B-2  Take 100 mg by mouth daily.     solifenacin 5 MG tablet   Commonly known as:  VESICARE  Take 5 mg by mouth daily.     traMADol 50 MG tablet  Commonly known as:  ULTRAM  Take 50 mg by mouth 3 (three) times daily.         The results of significant diagnostics from this hospitalization (including imaging, microbiology, ancillary and laboratory) are listed below for reference.    Significant Diagnostic Studies: Ct Abdomen Pelvis Wo Contrast  11/12/2013   CLINICAL DATA:  Vomiting and diarrhea.  EXAM: CT ABDOMEN AND PELVIS WITHOUT CONTRAST  TECHNIQUE: Multidetector CT imaging of the abdomen and pelvis was performed following the standard protocol without intravenous contrast.  COMPARISON:  CT abdomen and pelvis 03/18/2007.  FINDINGS: Mild linear atelectasis or scarring in the lung bases is noted. No pleural or pericardial effusion.  The patient is status post cholecystectomy. There is fatty infiltration of the liver without focal lesion. Splenic calcifications are consistent with old granulomatous disease and unchanged. The adrenal glands, pancreas and kidneys appear normal. The stomach, small and large bowel and appendix appear normal. No lymphadenopathy or fluid. No lytic or sclerotic bony lesion.  IMPRESSION: No acute finding abdomen or pelvis.  Fatty infiltration of the liver.   Electronically Signed   By: Inge Rise M.D.   On: 11/12/2013 01:29   Dg Chest 2 View  11/11/2013   CLINICAL DATA:  Cough and shortness of breath.  EXAM: CHEST  2 VIEW  COMPARISON:  PA and lateral chest 04/10/2013.  FINDINGS: Lungs are clear. Heart size is normal. No pneumothorax or pleural effusion. Asymmetric elevation of the right hemidiaphragm relative to the left is unchanged.  IMPRESSION: No acute disease.   Electronically Signed   By: Inge Rise M.D.   On: 11/11/2013 22:37   Ct Head Wo Contrast  11/11/2013   CLINICAL DATA:  Difficulty speaking, facial droop, history coronary artery disease, hypertension, diabetes, ovarian cancer  EXAM: CT HEAD WITHOUT  CONTRAST  TECHNIQUE: Contiguous axial images were obtained from the base of the skull through the vertex without intravenous contrast.  COMPARISON:  01/30/2010  FINDINGS: Normal ventricular morphology.  No midline shift or mass effect.  Normal appearance of brain parenchyma.  No intracranial hemorrhage, mass lesion or evidence acute infarction.  No extra-axial fluid collections.  Bones and sinuses unremarkable.  IMPRESSION: No acute intracranial abnormalities.  No interval change.  Findings called to Dr. Nicole Kindred on 11/11/2013 at 1933 hr.   Electronically Signed   By: Lavonia Dana M.D.   On: 11/11/2013 19:33   Mr Brain Wo Contrast  11/12/2013   CLINICAL DATA:  63 year old female with slurred speech vomiting diarrhea and left hemiparesis. Initial encounter.  EXAM: MRI HEAD WITHOUT CONTRAST  MRA HEAD WITHOUT CONTRAST  TECHNIQUE: Multiplanar, multiecho pulse sequences of the brain and surrounding structures were obtained without intravenous contrast. Angiographic images of the head were obtained using MRA technique without contrast.  COMPARISON:  Head CTs without contrast 11/11/2013 and earlier. Brain MRI 03/01/2005.  FINDINGS: MRI HEAD FINDINGS  No restricted diffusion or evidence of acute infarction. Major intracranial vascular flow voids are stable.  No cortical encephalomalacia. Scattered minimal to mild for age nonspecific cerebral white matter T2 and FLAIR hyperintensity. Small right superior cerebellar developmental venous anomaly suspected. No midline shift, mass effect, evidence of mass lesion, ventriculomegaly, extra-axial collection or acute intracranial hemorrhage. Cervicomedullary junction and pituitary are within normal limits. Negative visualized upper cervical spine.  Interval postoperative changes to the globes. New moderate sphenoid and right maxillary sinus mucosal thickening and opacification. Small bilateral mastoid effusions have increased. Negative visualized nasopharynx. Visible internal  auditory structures appear normal. Visualized scalp soft tissues are within normal limits. Stable bone marrow signal, within normal limits.  MRA HEAD FINDINGS  Antegrade flow in the posterior circulation with mildly dominant distal left vertebral artery. Normal PICA origins. Normal vertebrobasilar junction. Dominant right AICA. No basilar stenosis. SCA and PCA origins are normal. Normal right posterior communicating artery, the left is diminutive or absent. Bilateral PCA branches are within normal limits.  Antegrade flow in both ICA siphons. No ICA stenosis. Ophthalmic and right posterior communicating artery origins are normal. Small left supra clinoid ICA infundibulum suspected. Normal carotid termini, MCA and ACA origins. Anterior communicating artery and visualized ACA branches are within normal limits. Left MCA branches are within normal limits. Right MCA M1 segment is normal. Right MCA bifurcation and visualized right MCA branches are normal.  IMPRESSION: 1. No acute infarct or acute intracranial abnormality. Largely unremarkable for age non contrast MRI appearance of the brain. 2.  Negative intracranial MRA. 3. Paranasal sinus inflammatory changes and increased mild mastoid effusions.   Electronically Signed   By: Lars Pinks M.D.   On: 11/12/2013 11:14   Mr Jodene Nam Head/brain Wo Cm  11/12/2013   CLINICAL DATA:  63 year old female with slurred speech vomiting diarrhea and left hemiparesis. Initial encounter.  EXAM: MRI HEAD WITHOUT CONTRAST  MRA HEAD WITHOUT CONTRAST  TECHNIQUE: Multiplanar, multiecho pulse sequences of the brain and surrounding structures were obtained without intravenous contrast. Angiographic images of the head were obtained using MRA technique without contrast.  COMPARISON:  Head CTs without contrast 11/11/2013 and earlier. Brain MRI 03/01/2005.  FINDINGS: MRI HEAD FINDINGS  No restricted diffusion or evidence of acute infarction. Major intracranial vascular flow voids are stable.  No  cortical encephalomalacia. Scattered minimal to mild for age nonspecific cerebral white matter T2 and FLAIR hyperintensity. Small right superior cerebellar developmental venous anomaly suspected. No midline shift, mass effect, evidence of mass lesion, ventriculomegaly, extra-axial collection or acute intracranial hemorrhage. Cervicomedullary junction and pituitary are within normal limits. Negative visualized upper cervical spine.  Interval postoperative changes to the  globes. New moderate sphenoid and right maxillary sinus mucosal thickening and opacification. Small bilateral mastoid effusions have increased. Negative visualized nasopharynx. Visible internal auditory structures appear normal. Visualized scalp soft tissues are within normal limits. Stable bone marrow signal, within normal limits.  MRA HEAD FINDINGS  Antegrade flow in the posterior circulation with mildly dominant distal left vertebral artery. Normal PICA origins. Normal vertebrobasilar junction. Dominant right AICA. No basilar stenosis. SCA and PCA origins are normal. Normal right posterior communicating artery, the left is diminutive or absent. Bilateral PCA branches are within normal limits.  Antegrade flow in both ICA siphons. No ICA stenosis. Ophthalmic and right posterior communicating artery origins are normal. Small left supra clinoid ICA infundibulum suspected. Normal carotid termini, MCA and ACA origins. Anterior communicating artery and visualized ACA branches are within normal limits. Left MCA branches are within normal limits. Right MCA M1 segment is normal. Right MCA bifurcation and visualized right MCA branches are normal.  IMPRESSION: 1. No acute infarct or acute intracranial abnormality. Largely unremarkable for age non contrast MRI appearance of the brain. 2.  Negative intracranial MRA. 3. Paranasal sinus inflammatory changes and increased mild mastoid effusions.   Electronically Signed   By: Lars Pinks M.D.   On: 11/12/2013 11:14      Microbiology: No results found for this or any previous visit (from the past 240 hour(s)).   Labs: Basic Metabolic Panel:  Recent Labs Lab 11/11/13 1922 11/11/13 1932 11/13/13 0925  NA 145 144 140  K 5.0 4.7 4.3  CL 101 104 104  CO2 25  --  23  GLUCOSE 172* 175* 265*  BUN 37* 36* 28*  CREATININE 3.57* 3.90* 1.48*  CALCIUM 9.8  --  8.7   Liver Function Tests:  Recent Labs Lab 11/11/13 1922  AST 91*  ALT 41*  ALKPHOS 44  BILITOT 0.4  PROT 8.0  ALBUMIN 3.9   No results found for this basename: LIPASE, AMYLASE,  in the last 168 hours No results found for this basename: AMMONIA,  in the last 168 hours CBC:  Recent Labs Lab 11/11/13 1922 11/11/13 1932 11/13/13 0600  WBC 11.6*  --  4.8  NEUTROABS 8.1*  --   --   HGB 12.4 13.6 10.1*  HCT 37.9 40.0 31.1*  MCV 90.0  --  91.2  PLT 308  --  169   Cardiac Enzymes:  Recent Labs Lab 11/13/13 0925  CKTOTAL 406*   BNP: No components found with this basename: POCBNP,  CBG:  Recent Labs Lab 11/12/13 0412 11/12/13 1259 11/12/13 1638 11/12/13 2109 11/13/13 1143  GLUCAP 144* 125* 172* 195* 150*    Time coordinating discharge:  Greater than 30 minutes  Signed:  Anysha Frappier, DO [facility] Pager: [phone] 11/13/2013, 12:01 PM

## 2013-11-13 NOTE — Progress Notes (Signed)
Physical Therapy Treatment Patient Details Name: [name] [name] MRN: [id number] DOB: [birth year] Today's Date: 11/13/2013 Time: 0263-7858 PT Time Calculation (min): 18 min  PT Assessment / Plan / Recommendation  History of Present Illness pt presents with L sided weakness and numbness.     PT Comments   Patient progressing. Per patient and family, close to baseline. Patient is discharging home today  Follow Up Recommendations  Home health PT;Supervision/Assistance - 24 hour     Does the patient have the potential to tolerate intense rehabilitation     Barriers to Discharge        Equipment Recommendations  Hospital bed    Recommendations for Other Services    Frequency     Progress towards PT Goals Progress towards PT goals: Progressing toward goals  Plan Current plan remains appropriate    Precautions / Restrictions Precautions Precautions: Fall Precaution Comments: Hx of falls at home and per pt and family has been shaking for ~2-82months.     Pertinent Vitals/Pain no apparent distress     Mobility  Bed Mobility Overal bed mobility: Modified Independent Transfers Sit to Stand: Supervision General transfer comment: supervision for safety Ambulation/Gait Ambulation/Gait assistance: Min guard Ambulation Distance (Feet): 60 Feet Assistive device: None General Gait Details: Patient able to walk around room and manever around room including turns with no LOB. Somewhat guarded but no shaking noted Modified Rankin (Stroke Patients Only) Pre-Morbid Rankin Score: Moderate disability Modified Rankin: Moderately severe disability    Exercises     PT Diagnosis:    PT Problem List:   PT Treatment Interventions:     PT Goals (current goals can now be found in the care plan section)    Visit Information  Last PT Received On: 11/13/13 Assistance Needed: +1 History of Present Illness: pt presents with L sided weakness and numbness.      Subjective Data       Cognition  Cognition Arousal/Alertness: Awake/alert Behavior During Therapy: WFL for tasks assessed/performed Overall Cognitive Status: History of cognitive impairments - at baseline Memory: Decreased short-term memory    Balance     End of Session PT - End of Session Equipment Utilized During Treatment: Gait belt;Oxygen Activity Tolerance: Patient tolerated treatment well Patient left: in chair;with call bell/phone within reach;with family/visitor present Nurse Communication: Mobility status   GP     Jacqualyn Posey 11/13/2013, 1:12 PM  11/13/2013 Jacqualyn Posey PTA [phone] pager [phone] office

## 2013-11-13 NOTE — Progress Notes (Signed)
Spoke with patient about wearing her CPAP. CPAP is set up ready in the room and patient said she would get her nurse to call me when she was ready to put it on. Patient never called.

## 2013-11-19 ENCOUNTER — Ambulatory Visit (INDEPENDENT_AMBULATORY_CARE_PROVIDER_SITE_OTHER): Payer: Medicare Other | Admitting: Gastroenterology

## 2013-11-19 ENCOUNTER — Encounter: Payer: Self-pay | Admitting: Gastroenterology

## 2013-11-19 DIAGNOSIS — I2510 Atherosclerotic heart disease of native coronary artery without angina pectoris: Secondary | ICD-10-CM

## 2013-11-19 DIAGNOSIS — D649 Anemia, unspecified: Secondary | ICD-10-CM | POA: Insufficient documentation

## 2013-11-19 NOTE — Patient Instructions (Addendum)
Call to make a follow up appointment in four to six weeks with Dr. Deatra Ina. If not better call back sooner.  Continue your Dexilant   Please have your family care doctor check your labs.  Hemoccult Cards given today please follow instructions and mail back.

## 2013-11-19 NOTE — Progress Notes (Signed)
4/58/0998 [name] [name] [id number] 02/19/1951   History of Present Illness:  Patient is a 63 year old female who is known to Dr. Deatra Ina for treatment of esophageal reflux and gastroparesis.  Her last EGD was in September 2013 at which time she was found to have only an esophageal stricture that was dilated with a 74 [place]oney dilator.  She takes Dexilant 60 mg daily for her UGI issues.  Just of note, her last colonoscopy was in December 2014 at which time the study was normal it was recommended that she have a repeat procedure in 10 years from that time.  Anyway, she comes to our office today at the request of her PCP for evaluation regarding some anemia and an episode of black stools. The patient was recently hospitalized and discharged from the hospitalization on March 19 for acute renal failure and TIA.  The patient reports that earlier on the day of her hospital admission she had an episode of vomiting that looked like it contained some blood. She had no further vomiting during her hospitalization and has not had any since her hospital discharge. During her hospitalization she was constipated and not having bowel movements. She states that 2 days ago she had one stool that was she described it "dark and string", however.  Her stools have since return to normal. She denies any significant abdominal pain.  Throughout her hospitalization her hemoglobin remained in normal range until her day of discharge when it dropped by 3.5 grams.  There is no evidence of GI bleeding and she was discharged that same day, on March 19. The following day, March 20, she followed up with her PCP and repeat Hgb was stable at 10.6 grams.     Current Medications, Allergies, Past Medical History, Past Surgical History, Family History and Social History were reviewed in [employer] record.   Physical Exam: There were no vitals taken for this visit. General: Well developed white female in no  acute distress Head: Normocephalic and atraumatic Eyes:  Sclerae anicteric, conjunctiva pink  Ears: Normal auditory acuity Lungs:  Decreased breath sounds B/L. Heart: Regular rate and rhythm Abdomen: Soft, non-distended.  Normal bowel sounds.  Non-tender. Musculoskeletal: Symmetrical with no gross deformities  Extremities: No edema  Neurological: Alert oriented x 4, grossly non-focal Psychological:  Alert and cooperative. Normal mood and affect  Assessment and Recommendations: -63 year old female with one episode of possible hematemesis on 3/17 before recent hospitalization and then one episode of black stools two days ago.  No further vomiting and stools are now normal.  She is on Dexilant 60 mg daily and should continue that.  Will check stools for occult blood x 3.  Hgb will be followed/monitored by her PCP.  If Hgb remains low and/or she has further black stools or has heme positive stools then consider repeat EGD (EGD in 04/2012 showed only esophageal stricture at that time); also, her functional status is not great as she is on home O2 and had a recent TIA.  Follow-up in 4-6 weeks with Dr. Deatra Ina.   **Just of note, our visit was somewhat rushed today as the patient's O2 tank was very low so she was worried about that running out.

## 2013-11-20 DIAGNOSIS — Z8673 Personal history of transient ischemic attack (TIA), and cerebral infarction without residual deficits: Secondary | ICD-10-CM | POA: Insufficient documentation

## 2013-11-20 HISTORY — DX: Personal history of transient ischemic attack (TIA), and cerebral infarction without residual deficits: Z86.73

## 2013-11-25 NOTE — Progress Notes (Signed)
Reviewed and agree with management. Elaynah Virginia D. Megin Consalvo, M.D., FACG  

## 2013-11-28 ENCOUNTER — Other Ambulatory Visit: Payer: Self-pay | Admitting: Internal Medicine

## 2013-12-01 ENCOUNTER — Other Ambulatory Visit: Payer: Self-pay | Admitting: *Deleted

## 2013-12-11 ENCOUNTER — Other Ambulatory Visit (INDEPENDENT_AMBULATORY_CARE_PROVIDER_SITE_OTHER): Payer: Medicare Other

## 2013-12-11 DIAGNOSIS — D649 Anemia, unspecified: Secondary | ICD-10-CM

## 2013-12-11 LAB — HEMOCCULT SLIDES (X 3 CARDS)
OCCULT 1: NEGATIVE
OCCULT 2: NEGATIVE
OCCULT 3: NEGATIVE
OCCULT 4: NEGATIVE
OCCULT 5: NEGATIVE

## 2013-12-13 ENCOUNTER — Emergency Department (HOSPITAL_COMMUNITY): Payer: Medicare Other

## 2013-12-13 ENCOUNTER — Encounter (HOSPITAL_COMMUNITY): Payer: Self-pay | Admitting: Emergency Medicine

## 2013-12-13 ENCOUNTER — Emergency Department (HOSPITAL_COMMUNITY)
Admission: EM | Admit: 2013-12-13 | Discharge: 2013-12-13 | Disposition: A | Discharge status: Home / Self Care | Payer: Medicare Other | Attending: Emergency Medicine | Admitting: Emergency Medicine

## 2013-12-13 DIAGNOSIS — I509 Heart failure, unspecified: Secondary | ICD-10-CM | POA: Insufficient documentation

## 2013-12-13 DIAGNOSIS — Z79899 Other long term (current) drug therapy: Secondary | ICD-10-CM | POA: Insufficient documentation

## 2013-12-13 DIAGNOSIS — M25569 Pain in unspecified knee: Secondary | ICD-10-CM | POA: Insufficient documentation

## 2013-12-13 DIAGNOSIS — Z8543 Personal history of malignant neoplasm of ovary: Secondary | ICD-10-CM | POA: Insufficient documentation

## 2013-12-13 DIAGNOSIS — Z8673 Personal history of transient ischemic attack (TIA), and cerebral infarction without residual deficits: Secondary | ICD-10-CM | POA: Insufficient documentation

## 2013-12-13 DIAGNOSIS — F329 Major depressive disorder, single episode, unspecified: Secondary | ICD-10-CM | POA: Insufficient documentation

## 2013-12-13 DIAGNOSIS — E785 Hyperlipidemia, unspecified: Secondary | ICD-10-CM | POA: Insufficient documentation

## 2013-12-13 DIAGNOSIS — F3289 Other specified depressive episodes: Secondary | ICD-10-CM | POA: Insufficient documentation

## 2013-12-13 DIAGNOSIS — K219 Gastro-esophageal reflux disease without esophagitis: Secondary | ICD-10-CM | POA: Insufficient documentation

## 2013-12-13 DIAGNOSIS — E119 Type 2 diabetes mellitus without complications: Secondary | ICD-10-CM | POA: Insufficient documentation

## 2013-12-13 DIAGNOSIS — M7918 Myalgia, other site: Secondary | ICD-10-CM

## 2013-12-13 DIAGNOSIS — Z8619 Personal history of other infectious and parasitic diseases: Secondary | ICD-10-CM | POA: Insufficient documentation

## 2013-12-13 DIAGNOSIS — I2510 Atherosclerotic heart disease of native coronary artery without angina pectoris: Secondary | ICD-10-CM | POA: Insufficient documentation

## 2013-12-13 DIAGNOSIS — I10 Essential (primary) hypertension: Secondary | ICD-10-CM | POA: Insufficient documentation

## 2013-12-13 DIAGNOSIS — Z7982 Long term (current) use of aspirin: Secondary | ICD-10-CM | POA: Insufficient documentation

## 2013-12-13 DIAGNOSIS — R42 Dizziness and giddiness: Secondary | ICD-10-CM

## 2013-12-13 LAB — CBC WITH DIFFERENTIAL/PLATELET
Basophils Absolute: 0 10*3/uL (ref 0.0–0.1)
Basophils Relative: 0 % (ref 0–1)
Eosinophils Absolute: 0.2 10*3/uL (ref 0.0–0.7)
Eosinophils Relative: 3 % (ref 0–5)
HCT: 34.7 % — ABNORMAL LOW (ref 36.0–46.0)
Hemoglobin: 11.3 g/dL — ABNORMAL LOW (ref 12.0–15.0)
LYMPHS PCT: 34 % (ref 12–46)
Lymphs Abs: 1.9 10*3/uL (ref 0.7–4.0)
MCH: 29 pg (ref 26.0–34.0)
MCHC: 32.6 g/dL (ref 30.0–36.0)
MCV: 89.2 fL (ref 78.0–100.0)
MONO ABS: 0.4 10*3/uL (ref 0.1–1.0)
Monocytes Relative: 7 % (ref 3–12)
Neutro Abs: 3.1 10*3/uL (ref 1.7–7.7)
Neutrophils Relative %: 56 % (ref 43–77)
Platelets: 211 10*3/uL (ref 150–400)
RBC: 3.89 MIL/uL (ref 3.87–5.11)
RDW: 13.6 % (ref 11.5–15.5)
WBC: 5.6 10*3/uL (ref 4.0–10.5)

## 2013-12-13 LAB — BASIC METABOLIC PANEL
BUN: 17 mg/dL (ref 6–23)
CO2: 26 meq/L (ref 19–32)
Calcium: 9.7 mg/dL (ref 8.4–10.5)
Chloride: 105 mEq/L (ref 96–112)
Creatinine, Ser: 1.14 mg/dL — ABNORMAL HIGH (ref 0.50–1.10)
GFR calc Af Amer: 59 mL/min — ABNORMAL LOW (ref >90)
GFR calc non Af Amer: 51 mL/min — ABNORMAL LOW (ref >90)
GLUCOSE: 143 mg/dL — AB (ref 70–99)
Potassium: 3.5 mEq/L — ABNORMAL LOW (ref 3.7–5.3)
Sodium: 144 mEq/L (ref 137–147)

## 2013-12-13 MED ORDER — LORAZEPAM 1 MG PO TABS
1.0000 mg | ORAL_TABLET | Freq: Once | ORAL | Status: AC
  Administered 2013-12-13: 1 mg via ORAL
  Filled 2013-12-13: qty 1

## 2013-12-13 MED ORDER — SODIUM CHLORIDE 0.9 % IV BOLUS (SEPSIS)
1000.0000 mL | Freq: Once | INTRAVENOUS | Status: AC
  Administered 2013-12-13: 1000 mL via INTRAVENOUS

## 2013-12-13 MED ORDER — LORAZEPAM 1 MG PO TABS: 1.0000 mg | ORAL_TABLET | Freq: Three times a day (TID) | ORAL | Status: DC | PRN

## 2013-12-13 MED ORDER — MORPHINE SULFATE 4 MG/ML IJ SOLN
4.0000 mg | Freq: Once | INTRAMUSCULAR | Status: AC
  Administered 2013-12-13: 4 mg via INTRAVENOUS
  Filled 2013-12-13: qty 1

## 2013-12-13 NOTE — ED Notes (Signed)
Pt fell last month, c/o right knee pain.  States has had everyday dizziness since being discharged from hospital for TIA.

## 2013-12-13 NOTE — Discharge Instructions (Signed)
Musculoskeletal Pain Musculoskeletal pain is muscle and boney aches and pains. These pains can occur in any part of the body. Your caregiver may treat you without knowing the cause of the pain. They may treat you if blood or urine tests, X-rays, and other tests were normal.  CAUSES There is often not a definite cause or reason for these pains. These pains may be caused by a type of germ (virus). The discomfort may also come from overuse. Overuse includes working out too hard when your body is not fit. Boney aches also come from weather changes. Bone is sensitive to atmospheric pressure changes. HOME CARE INSTRUCTIONS   Ask when your test results will be ready. Make sure you get your test results.  Only take over-the-counter or prescription medicines for pain, discomfort, or fever as directed by your caregiver. If you were given medications for your condition, do not drive, operate machinery or power tools, or sign legal documents for 24 hours. Do not drink alcohol. Do not take sleeping pills or other medications that may interfere with treatment.  Continue all activities unless the activities cause more pain. When the pain lessens, slowly resume normal activities. Gradually increase the intensity and duration of the activities or exercise.  During periods of severe pain, bed rest may be helpful. Lay or sit in any position that is comfortable.  Putting ice on the injured area.  Put ice in a bag.  Place a towel between your skin and the bag.  Leave the ice on for 15 to 20 minutes, 3 to 4 times a day.  Follow up with your caregiver for continued problems and no reason can be found for the pain. If the pain becomes worse or does not go away, it may be necessary to repeat tests or do additional testing. Your caregiver may need to look further for a possible cause. SEEK IMMEDIATE MEDICAL CARE IF:  You have pain that is getting worse and is not relieved by medications.  You develop chest pain  that is associated with shortness or breath, sweating, feeling sick to your stomach (nauseous), or throw up (vomit).  Your pain becomes localized to the abdomen.  You develop any new symptoms that seem different or that concern you. MAKE SURE YOU:   Understand these instructions.  Will watch your condition.  Will get help right away if you are not doing well or get worse. Document Released: 08/14/2005 Document Revised: 11/06/2011 Document Reviewed: 04/18/2013 [facility] Patient Information 2014 [place]. Dizziness Dizziness is a common problem. It is a feeling of unsteadiness or lightheadedness. You may feel like you are about to faint. Dizziness can lead to injury if you stumble or fall. A person of any age group can suffer from dizziness, but dizziness is more common in older adults. CAUSES  Dizziness can be caused by many different things, including:  Middle ear problems.  Standing for too long.  Infections.  An allergic reaction.  Aging.  An emotional response to something, such as the sight of blood.  Side effects of medicines.  Fatigue.  Problems with circulation or blood pressure.  Excess use of alcohol, medicines, or illegal drug use.  Breathing too fast (hyperventilation).  An arrhythmia or problems with your heart rhythm.  Low red blood cell count (anemia).  Pregnancy.  Vomiting, diarrhea, fever, or other illnesses that cause dehydration.  Diseases or conditions such as Parkinson's disease, high blood pressure (hypertension), diabetes, and thyroid problems.  Exposure to extreme heat. DIAGNOSIS  To  find the cause of your dizziness, your caregiver may do a physical exam, lab tests, radiologic imaging scans, or an electrocardiography test (ECG).  TREATMENT  Treatment of dizziness depends on the cause of your symptoms and can vary greatly. HOME CARE INSTRUCTIONS   Drink enough fluids to keep your urine clear or pale yellow. This is especially  important in very hot weather. In the elderly, it is also important in cold weather.  If your dizziness is caused by medicines, take them exactly as directed. When taking blood pressure medicines, it is especially important to get up slowly.  Rise slowly from chairs and steady yourself until you feel okay.  In the morning, first sit up on the side of the bed. When this seems okay, stand slowly while holding onto something until you know your balance is fine.  If you need to stand in one place for a long time, be sure to move your legs often. Tighten and relax the muscles in your legs while standing.  If dizziness continues to be a problem, have someone stay with you for a day or two. Do this until you feel you are well enough to stay alone. Have the person call your caregiver if he or she notices changes in you that are concerning.  Do not drive or use heavy machinery if you feel dizzy.  Do not drink alcohol. SEEK IMMEDIATE MEDICAL CARE IF:   Your dizziness or lightheadedness gets worse.  You feel nauseous or vomit.  You develop problems with talking, walking, weakness, or using your arms, hands, or legs.  You are not thinking clearly or you have difficulty forming sentences. It may take a friend or family member to determine if your thinking is normal.  You develop chest pain, abdominal pain, shortness of breath, or sweating.  Your vision changes.  You notice any bleeding.  You have side effects from medicine that seems to be getting worse rather than better. MAKE SURE YOU:   Understand these instructions.  Will watch your condition.  Will get help right away if you are not doing well or get worse. Document Released: 02/07/2001 Document Revised: 11/06/2011 Document Reviewed: 03/03/2011 [facility] Patient Information 2014 [place], [place].

## 2013-12-13 NOTE — ED Notes (Signed)
Gave report to [place], Therapist, sports.  Pt will be transported to B19 when she returns from xray.

## 2013-12-13 NOTE — ED Notes (Signed)
Pt back from xray, to room B19

## 2013-12-13 NOTE — ED Notes (Signed)
shes had R leg pain since a fall last month. Today her sons noticed some bruising on her leg so they decided to bring her to the ER. She states 'it feels like bones are moving in there when i walk."

## 2013-12-13 NOTE — ED Notes (Signed)
Patient transported to X-ray 

## 2013-12-14 NOTE — ED Provider Notes (Signed)
CSN: [id number]     Arrival date & time 12/13/13  1757 History   First MD Initiated Contact with Patient 12/13/13 1804     Chief Complaint  Patient presents with  . Dizziness     (Consider location/radiation/quality/duration/timing/severity/associated sxs/prior Treatment) HPI  This is a 63 year old female, with a past medical history of hyperlipidemia, CAD, hypertension, CHF, diabetes, and recent TIA without any MRI or angiogram findings, presenting today with pain. Onset 2 days after discharge. Located proximal aspect of right lower leg. Persistent. Throbbing. Not alleviated with Norco or tramadol. It radiates up the leg. Negative for weakness, numbness, tingling, trauma, wound.  Patient also states that she has had dizziness since this time as well.  Past Medical History  Diagnosis Date  . Hyperlipidemia   . Benign neoplasm of stomach   . Dizziness and giddiness   . Gastroparesis   . Esophageal reflux   . CAD (coronary artery disease)     a.  cath 2005: LAD 40%;  b. Myoview 3/12: EF 70%, no ischemia;  c. Myoview 9/13: EF 76%, no ischemia  . HTN (hypertension)   . Palpitations   . Depression   . Aortic stenosis     Echo 8/13: EF 55-60%, Mild LVH, mild AS, mean gradient 12 mmHg,   . CHF (congestive heart failure) 06-13-12    episode after Cancer surgery  . PONV (postoperative nausea and vomiting)   . DM2 (diabetes mellitus, type 2) 06-13-12    NIDDM  . Ovarian cancer 06-13-12    surgery only x2-no recent problems  . Chronic hepatitis C without mention of hepatic coma 06-13-12    "states is active"  . Sleep apnea 06-13-12    does not use cpap, cpap setting of 2 when used cpap   Past Surgical History  Procedure Laterality Date  . Tubal ligation    . Abdominal hysterectomy    . Oophorectomy      1 ovary removed, then later last ovary removed  . Tonsillectomy  age 75  . Bladder suspension  yrs ago    with posterior repair  . Knee arthroscopy      left  . Cataract  extraction Bilateral 03/2013  . Breast biopsy  1972    left  . Abdominal hysterectomy  yrs ago  . Cholecystectomy  5 yrs ago  . Colonoscopy N/A 08/11/2013    Procedure: COLONOSCOPY;  Surgeon: Inda Castle, MD;  Location: [facility] ENDOSCOPY;  Service: Endoscopy;  Laterality: N/A;   Family History  Problem Relation Age of Onset  . Brain cancer Father     spine cancer  . Heart disease Mother   . Breast cancer Sister   . Kidney disease Sister   . Diabetes Brother   . Esophageal cancer Brother   . Diabetes Maternal Aunt   . Diabetes Paternal Aunt   . Stomach cancer Paternal Grandfather    History  Substance Use Topics  . Smoking status: Never Smoker   . Smokeless tobacco: Never Used  . Alcohol Use: No     Comment: past hx. , no current use in 11 yrs.   OB History   Grav Para Term Preterm Abortions TAB SAB Ect Mult Living                 Review of Systems  Constitutional: Negative for fever and chills.  HENT: Negative for facial swelling.   Eyes: Negative for photophobia and pain.  Respiratory: Negative for cough and shortness of breath.  Cardiovascular: Negative for chest pain and leg swelling.  Gastrointestinal: Negative for nausea, vomiting and abdominal pain.  Genitourinary: Negative for dysuria.  Musculoskeletal: Positive for arthralgias.  Skin: Negative for rash and wound.  Neurological: Positive for dizziness. Negative for seizures.  Hematological: Negative for adenopathy.      Allergies  Sunflower oil; Iodine; Metoclopramide hcl; and Sulfamethoxazole  Home Medications   Prior to Admission medications   Medication Sig Start Date End Date Taking? Authorizing Provider  ARIPiprazole (ABILIFY) 5 MG tablet Take 5 mg by mouth daily.   Yes Historical Provider, MD  ascorbic acid (VITAMIN C) 500 MG tablet Take 500 mg by mouth at bedtime.    Yes Historical Provider, MD  aspirin 325 MG tablet Take 1 tablet (325 mg total) by mouth daily. 11/13/13  Yes Orson Eva, MD   dexlansoprazole (DEXILANT) 60 MG capsule Take 60 mg by mouth every morning.   Yes Historical Provider, MD  fenofibrate (TRICOR) 145 MG tablet Take 145 mg by mouth daily. 11/02/13  Yes Historical Provider, MD  gabapentin (NEURONTIN) 300 MG capsule Take 300 mg by mouth 2 (two) times daily.   Yes Historical Provider, MD  glimepiride (AMARYL) 4 MG tablet Take 4 mg by mouth every morning.  06/21/13  Yes Historical Provider, MD  HYDROcodone-acetaminophen (NORCO) 7.5-325 MG per tablet Take 1 tablet by mouth 2 (two) times daily.  07/08/13  Yes Historical Provider, MD  Multiple Vitamin (MULTIVITAMIN WITH MINERALS) TABS tablet Take 1 tablet by mouth daily.   Yes Historical Provider, MD  nitroGLYCERIN (NITROSTAT) 0.4 MG SL tablet Place 0.4 mg under the tongue every 5 (five) minutes as needed for chest pain.    Yes Historical Provider, MD  PARoxetine (PAXIL-CR) 25 MG 24 hr tablet Take 50 mg by mouth at bedtime.    Yes Historical Provider, MD  RESTASIS 0.05 % ophthalmic emulsion Place 1 drop into both eyes 2 (two) times daily.  05/20/13  Yes Historical Provider, MD  solifenacin (VESICARE) 5 MG tablet Take 5 mg by mouth daily.    Yes Historical Provider, MD  traMADol (ULTRAM) 50 MG tablet Take 50 mg by mouth 3 (three) times daily.  07/08/13  Yes Historical Provider, MD  LORazepam (ATIVAN) 1 MG tablet Take 1 tablet (1 mg total) by mouth 3 (three) times daily as needed (dizziness). 12/13/13   Doy Hutching, MD   BP 153/67  Pulse 90  Temp(Src) 98.4 F (36.9 C) (Oral)  Resp 26  SpO2 100% Physical Exam  Constitutional: She is oriented to person, place, and time. She appears well-developed and well-nourished. No distress.  HENT:  Head: Normocephalic and atraumatic.  Mouth/Throat: No oropharyngeal exudate.  Eyes: Conjunctivae are normal. Pupils are equal, round, and reactive to light. No scleral icterus.  Neck: Normal range of motion. No tracheal deviation present. No thyromegaly present.  Cardiovascular:  Normal rate, regular rhythm and normal heart sounds.  Exam reveals no gallop and no friction rub.   No murmur heard. Pulmonary/Chest: Effort normal and breath sounds normal. No stridor. No respiratory distress. She has no wheezes. She has no rales. She exhibits no tenderness.  Abdominal: Soft. She exhibits no distension and no mass. There is no tenderness. There is no rebound and no guarding.  Musculoskeletal: Normal range of motion. She exhibits no edema.  Neurological: She is alert and oriented to person, place, and time. She has normal strength. She displays normal reflexes. No cranial nerve deficit or sensory deficit. She exhibits normal muscle tone. She displays a  negative Romberg sign. Coordination and gait normal. GCS eye subscore is 4. GCS verbal subscore is 5. GCS motor subscore is 6.  Reflex Scores:      Patellar reflexes are 2+ on the right side and 2+ on the left side. Skin: Skin is warm and dry. She is not diaphoretic.    ED Course  Procedures (including critical care time) Labs Review Labs Reviewed  CBC WITH DIFFERENTIAL - Abnormal; Notable for the following:    Hemoglobin 11.3 (*)    HCT 34.7 (*)    All other components within normal limits  BASIC METABOLIC PANEL - Abnormal; Notable for the following:    Potassium 3.5 (*)    Glucose, Bld 143 (*)    Creatinine, Ser 1.14 (*)    GFR calc non Af Amer 51 (*)    GFR calc Af Amer 59 (*)    All other components within normal limits    Imaging Review Dg Knee Complete 4 Views Right  12/13/2013   CLINICAL DATA:  Pain post trauma  EXAM: RIGHT KNEE - COMPLETE 4+ VIEW  COMPARISON:  None.  FINDINGS: Frontal, lateral, and bilateral oblique views were obtained. There is no fracture or dislocation. No effusion. There is moderate narrowing medially with spurring medially. There is spurring arising from the anterior superior patella. No erosive change. There is a mild degree of meniscal calcification.  IMPRESSION: Osteoarthritic change.  No  fracture or effusion.   Electronically Signed   By: Lowella Grip M.D.   On: 12/13/2013 18:51     EKG Interpretation None      MDM   Final diagnoses:  Musculoskeletal pain  Dizziness    This is a 63 year old female, with a past medical history of hyperlipidemia, CAD, hypertension, CHF, diabetes, and recent TIA without any MRI or angiogram findings, presenting today with pain. Onset 2 days after discharge. Located proximal aspect of right lower leg. Persistent. Throbbing. Not alleviated with Norco or tramadol. It radiates up the leg. Negative for weakness, numbness, tingling, trauma, wound.  Patient also states that she has had dizziness since this time as well.  On physical examination I appreciate after absolutely no abnormalities of her right lower extremity. He bruised the patient complains of I do not appreciate. She has absolutely no tenderness to palpation. She has no neurologic or vascular deficits. She has no erythema or swelling or heat in this area. She has full range of motion of the extremity. She ambulates without ataxia and no indication of pain. I do not believe this represents fracture, dislocation, infection, crystal disease, DVT, arterial disease. I believe that she is suffering from muscle soreness. Pertaining to dizziness, she has a normal head and pulse, test of skew exam. She has no nystagmus.  She has absolutely no neurologic deficits. Her symptoms have been alleviated with 1 mg of Ativan by mouth.  X-ray of the right knee is within normal limits. Pt stable for discharge, FU with PCP.  All questions answered.  Return precautions given.  I have discussed case and care has been guided by my attending physician, Dr. Stevie Kern.  Doy Hutching, MD 12/14/13 [phone]

## 2013-12-18 NOTE — ED Provider Notes (Signed)
I saw and evaluated the patient, reviewed the resident's note and I agree with the findings and plan.   EKG Interpretation None     Pt points to proximal tibia region as only site of pain; doubt DVT.  Babette Relic, MD 12/18/13 [phone]

## 2014-01-13 ENCOUNTER — Ambulatory Visit: Payer: Medicare Other | Admitting: Emergency Medicine

## 2014-01-14 ENCOUNTER — Other Ambulatory Visit: Payer: Self-pay | Admitting: Gastroenterology

## 2014-01-22 ENCOUNTER — Ambulatory Visit (INDEPENDENT_AMBULATORY_CARE_PROVIDER_SITE_OTHER): Payer: Medicare Other | Admitting: Emergency Medicine

## 2014-01-22 ENCOUNTER — Encounter: Payer: Self-pay | Admitting: Emergency Medicine

## 2014-01-22 VITALS — BP 130/82 | HR 76 | Ht 66.0 in | Wt 184.0 lb

## 2014-01-22 DIAGNOSIS — J9610 Chronic respiratory failure, unspecified whether with hypoxia or hypercapnia: Secondary | ICD-10-CM

## 2014-01-22 DIAGNOSIS — J309 Allergic rhinitis, unspecified: Secondary | ICD-10-CM

## 2014-01-22 DIAGNOSIS — I2510 Atherosclerotic heart disease of native coronary artery without angina pectoris: Secondary | ICD-10-CM

## 2014-01-22 MED ORDER — LORATADINE 10 MG PO TABS: 10.0000 mg | ORAL_TABLET | Freq: Every day | ORAL | Status: DC

## 2014-01-22 NOTE — Assessment & Plan Note (Signed)
Start loratadine 10,

## 2014-01-22 NOTE — Assessment & Plan Note (Signed)
Due to restriction, OSA/OHS. She is unable to wear CPAP.  - would like to get her portable concentrator to improve her o2 compliance

## 2014-01-22 NOTE — Patient Instructions (Signed)
We will work on getting you a portable oxygen concentrator We need to get you restarted on CPAP. Please let us know if we can help you improve your mask fit along with [facility] Start loratadine 10mg  daily Follow with Dr Lamonte Sakai in 6 months or sooner if you have any problems

## 2014-01-22 NOTE — Progress Notes (Signed)
Subjective:    Patient ID: [name] [name], female    DOB: [birth year], 63 y.o.   MRN: [id number]  HPI 63 yo woman, never smoker, mild CAD, mild AS, Hep C, HTN, small L PE in 05/2012 completed therapy, OSA not reliable on CPAP, DM, ovarian CA. Followed by Dr Harrington Challenger at [place] and referred for dyspnea and hypoxemia. Of note was seen in ED 04/10/13 for chest pressure and dyspnea > V/q low prob. She has been having dyspnea and difficulty taking a deep breath for about 2 weeks. Never had this problem before. Bothers her when eating, when she wakes up in am while supine - better when she gets up. She describes recent falls - about 3 times in last several months. The last time was 3 weeks ago. No clear trauma to ribs, although she did hit her back and head. Just moved into a camper, not using CPAP for 9-10 months. She has been noted to have desats in the ED and at Cardiology. No wheeze, occas dry cough (on lisinopril). She has albuterol - may help her some.   ROV 05/15/13 -- follows up for dyspnea and hypoxemia. She returns for f/u. She has not restarted CPAP, although she did wear it for one night to get her ONO. We performed ONO on CPAP that showed desat < 88% for 20% of the night. PFT performed > show restriction and decreased DLCO. TTE from 2013 showed normal RV size and fxn.     ROV 09/19/13 - Hx dyspnea and hypoxemia.  She has not been able to go back to CPAP, feels the mask fit is poor  ROV 01/22/14 -- hx restrictive disease, OSA on CPAP but , dyspnea, hypoxemia. Also hx PE, DM, ovarian CA, CAD and AS. Last time we tried to restart CPAP with a new mask but she was unable to wear due to leak. Since last time she unfortunately suffered a TIA - she is still dealing with some memory problems. Oxygen compliance is good inside, but she cannot use it when she is walking her dogs because she is worried about getting tangled up. She is having increased PND, dyspnea. Not on allergy meds.   Review of Systems   Constitutional: Positive for unexpected weight change. Negative for fever.  HENT: Positive for congestion and trouble swallowing. Negative for dental problem, ear pain, nosebleeds, postnasal drip, rhinorrhea, sinus pressure, sneezing and sore throat.   Eyes: Negative for redness and itching.  Respiratory: Positive for cough and shortness of breath. Negative for chest tightness and wheezing.   Cardiovascular: Positive for leg swelling. Negative for palpitations.  Gastrointestinal: Negative for nausea and vomiting.  Genitourinary: Negative for dysuria.  Musculoskeletal: Negative for joint swelling.  Skin: Negative for rash.  Neurological: Positive for dizziness and light-headedness. Negative for headaches.  Hematological: Does not bruise/bleed easily.  Psychiatric/Behavioral: Positive for dysphoric mood. The patient is not nervous/anxious.     06/21/12 --  Comparison: Chest x-ray of 05/24/2012  Findings: On the second contrast injection the pulmonary arteries  are moderately well opacified. The only questionable abnormality  is a tiny filling defect within a sub segmental branch of the  pulmonary artery to the left upper lobe. A very small pulmonary  embolism in this subsegmental branch cannot be excluded. No other  filling defect is seen throughout the pulmonary arterial system.  The thoracic aorta opacifies with no acute abnormality. The  origins of the great vessels are patent. No mediastinal or hilar  adenopathy is seen.  Imaging through the upper abdomen, there is  diffuse fatty infiltration of the liver and the liver appears  enlarged. Surgical clips are present from prior cholecystectomy.  On the lung window images, no lung parenchymal lesion is seen. No  infiltrate is noted. No pleural effusion is noted. Incidental  calcified splenic granuloma are present from prior granulomatous  disease. No bony abnormality is seen.  IMPRESSION:  1. Tiny pulmonary embolus within a  subsegmental branch of the left  upper lobe pulmonary artery. No other filling defect is seen.  2. No infiltrate, effusion, or nodule.  3. Diffuse fatty infiltration of the liver. Suspect hepatomegaly  4. Calcified splenic granulomas from prior granulomatous disease.       Objective:   Physical Exam Filed Vitals:   01/22/14 0927  BP: 130/82  Pulse: 76  Height: 5\' 6"  (1.676 m)  Weight: 184 lb (83.462 kg)  SpO2: 98%   Gen: Pleasant, overwt, in no distress, reserved affect  ENT: No lesions,  mouth clear,  oropharynx clear, no postnasal drip  Neck: No JVD, no TMG, no carotid bruits, no stridor  Lungs: No use of accessory muscles, no dullness to percussion, clear without rales or rhonchi  Cardiovascular: RRR, heart sounds normal, no murmur or gallops, no peripheral edema  Musculoskeletal: No deformities, no cyanosis or clubbing  Neuro: alert, non focal  Skin: Warm, no lesions or rashes     Assessment & Plan:  Chronic respiratory failure Due to restriction, OSA/OHS. She is unable to wear CPAP.  - would like to get her portable concentrator to improve her o2 compliance   Allergic rhinitis Start loratadine 10,

## 2014-02-11 ENCOUNTER — Other Ambulatory Visit: Payer: Self-pay | Admitting: Internal Medicine

## 2014-02-21 ENCOUNTER — Other Ambulatory Visit: Payer: Self-pay | Admitting: Internal Medicine

## 2014-02-23 ENCOUNTER — Other Ambulatory Visit: Payer: Self-pay

## 2014-03-23 ENCOUNTER — Telehealth: Payer: Self-pay | Admitting: Emergency Medicine

## 2014-03-23 ENCOUNTER — Telehealth: Payer: Self-pay | Admitting: Gastroenterology

## 2014-03-23 DIAGNOSIS — J9610 Chronic respiratory failure, unspecified whether with hypoxia or hypercapnia: Secondary | ICD-10-CM

## 2014-03-23 NOTE — Telephone Encounter (Signed)
Called and spoke with pt and she stated that she has been trying to get a small POC, but was advised by [facility] that they are not sure if they will be getting this in.  Pt is wanting to change to this machine so she does not have to worry about filling up tanks when she leaves home.  RB please advise if ok to change to another DME for pt to get the POC.  Thanks  Allergies  Allergen Reactions  . Sunflower Oil Anaphylaxis  . Iodine Nausea And Vomiting    Fish causes n/v  . Metoclopramide Hcl Other (See Comments)    Nerves tense up, panic attack  . Sulfamethoxazole Nausea And Vomiting    Current Outpatient Prescriptions on File Prior to Visit  Medication Sig Dispense Refill  . ARIPiprazole (ABILIFY) 5 MG tablet Take 5 mg by mouth daily.      [name] ascorbic acid (VITAMIN C) 500 MG tablet Take 500 mg by mouth at bedtime.       [name] aspirin 325 MG tablet Take 1 tablet (325 mg total) by mouth daily.      [name] DEXILANT 60 MG capsule TAKE ONE CAPSULE BY MOUTH EVERY DAY BEFORE BREAKFAST  60 capsule  0  . famotidine (PEPCID) 20 MG tablet TAKE 1 TABLET BY MOUTH EVERY NIGHT AT BEDTIME AS NEEDED FOR HEARTBURN  30 tablet  0  . fenofibrate (TRICOR) 145 MG tablet Take 145 mg by mouth daily.      [name] gabapentin (NEURONTIN) 300 MG capsule Take 300 mg by mouth 2 (two) times daily.      [name] glimepiride (AMARYL) 4 MG tablet Take 4 mg by mouth every morning.       [name] HYDROcodone-acetaminophen (NORCO) 7.5-325 MG per tablet Take 1 tablet by mouth 2 (two) times daily.       [name] loratadine (CLARITIN) 10 MG tablet Take 1 tablet (10 mg total) by mouth daily.  30 tablet  4  . LORazepam (ATIVAN) 1 MG tablet Take 1 tablet (1 mg total) by mouth 3 (three) times daily as needed (dizziness).  10 tablet  0  . Multiple Vitamin (MULTIVITAMIN WITH MINERALS) TABS tablet Take 1 tablet by mouth daily.      . nitroGLYCERIN (NITROSTAT) 0.4 MG SL tablet Place 0.4 mg under the tongue every 5 (five) minutes as needed for chest pain.       [name] PARoxetine  (PAXIL-CR) 25 MG 24 hr tablet Take 50 mg by mouth at bedtime.       . RESTASIS 0.05 % ophthalmic emulsion Place 1 drop into both eyes 2 (two) times daily.       . solifenacin (VESICARE) 5 MG tablet Take 5 mg by mouth daily.       . traMADol (ULTRAM) 50 MG tablet Take 50 mg by mouth 3 (three) times daily.        No current facility-administered medications on file prior to visit.

## 2014-03-23 NOTE — Telephone Encounter (Signed)
This is OK with me, although I was told last week that [facility] is going to start providing POC's. You should check to see if [facility] can't do it. If that is the case then would change DME's

## 2014-03-23 NOTE — Telephone Encounter (Signed)
Called [facility] and was advised by [place] that they do provide POC. Called and spoke to pt. Informed her that we can place the order and someone will contact her to set it up, pt verbalized understanding and stated to continue with the order. Order placed. Nothing further needed.

## 2014-03-24 MED ORDER — FAMOTIDINE 20 MG PO TABS: ORAL_TABLET | ORAL | Status: DC

## 2014-03-24 NOTE — Telephone Encounter (Signed)
Patient needed pepcid sent   Just sent to pharmacy

## 2014-03-24 NOTE — Telephone Encounter (Signed)
Returned patients call she will call me back with which med she needed a refill on

## 2014-04-06 ENCOUNTER — Other Ambulatory Visit: Payer: Self-pay | Admitting: Gastroenterology

## 2014-04-08 ENCOUNTER — Encounter: Payer: Self-pay | Admitting: Gastroenterology

## 2014-04-08 ENCOUNTER — Ambulatory Visit (INDEPENDENT_AMBULATORY_CARE_PROVIDER_SITE_OTHER): Payer: Medicare Other | Admitting: Gastroenterology

## 2014-04-08 VITALS — BP 112/72 | HR 66 | Ht 66.0 in | Wt 182.0 lb

## 2014-04-08 DIAGNOSIS — I2510 Atherosclerotic heart disease of native coronary artery without angina pectoris: Secondary | ICD-10-CM

## 2014-04-08 DIAGNOSIS — K219 Gastro-esophageal reflux disease without esophagitis: Secondary | ICD-10-CM

## 2014-04-08 DIAGNOSIS — R197 Diarrhea, unspecified: Secondary | ICD-10-CM

## 2014-04-08 MED ORDER — DEXLANSOPRAZOLE 60 MG PO CPDR: 60.0000 mg | DELAYED_RELEASE_CAPSULE | Freq: Every day | ORAL | Status: DC

## 2014-04-08 MED ORDER — FAMOTIDINE 20 MG PO TABS
ORAL_TABLET | ORAL | Status: DC
Start: 2014-04-08 — End: 2014-06-04

## 2014-04-08 NOTE — Progress Notes (Signed)
     3/84/6659 [name] [name] [id number] [birth year]   History of Present Illness:  Patient is a 63 year old female who is known to Dr. Deatra Ina for treatment of esophageal reflux and gastroparesis.  Her last EGD was in September 2013 at which time she was found to have only an esophageal stricture that was dilated with a 97 [place] Maloney dilator.  She takes Dexilant 60 mg daily and pepcid at bedtime prn for her UGI issues.  Just of note, her last colonoscopy was in December 2014 at which time the study was normal it was recommended that she have a repeat procedure in 10 years from that time.  She comes to the office today for refills on her Dexilant and pepcid.  Her UGI symptoms have been doing well, but while she is here she mentions that she has been having diarrhea for the past month.  Having 6-10 BMs per day, although some of them are small amounts with a lot of gas.  Has urgency and having nocturnal bowel movements as well.  Denies any travel.  No new medications or change in medications within the past couple of months.  No antibiotics recently.  Denies seeing blood in her stool.  Has abdominal cramping.    Just of note, she has multiple medical problems including recent TIA's and is O2 dependent.     Current Medications, Allergies, Past Medical History, Past Surgical History, Family History and Social History were reviewed in [employer] record.   Physical Exam: BP 112/72  Pulse 66  Ht 5\' 6"  (1.676 m)  Wt 182 lb (82.555 kg)  BMI 29.39 kg/m2 General: Well developed white female in no acute distress Head: Normocephalic and atraumatic Eyes:  Sclerae anicteric, conjunctiva pink  Ears: Normal auditory acuity Lungs:  Decreased BS B/L. Heart: Regular rate and rhythm Abdomen: Soft, non-distended.  Normal bowel sounds.  Non-tender. Musculoskeletal: Symmetrical with no gross deformities  Extremities: No edema  Neurological: Alert oriented x 4, grossly  non-focal Psychological:  Alert and cooperative. Normal mood and affect  Assessment and Recommendations: -GERD:  Continue Dexilant 60 mg daily and pepcid 20 mg at bedtime prn; prescriptions will be renewed. -Diarrhea x 1 month:  Will check stool GI pathogen panel.  If positive then will treat appropriately.  If negative then could still possibly treat with a course of xifaxan or flagyl.

## 2014-04-08 NOTE — Patient Instructions (Signed)
Your physician has requested that you go to the basement for the following lab work before leaving today: GI Pathogen Panel

## 2014-04-09 ENCOUNTER — Other Ambulatory Visit: Payer: Medicare Other

## 2014-04-09 DIAGNOSIS — R197 Diarrhea, unspecified: Secondary | ICD-10-CM

## 2014-04-09 NOTE — Progress Notes (Signed)
Reviewed and agree with management. Kerim Statzer D. Latron Ribas, M.D., FACG  

## 2014-04-10 LAB — GASTROINTESTINAL PATHOGEN PANEL PCR
C. difficile Tox A/B, PCR: NEGATIVE
CAMPYLOBACTER, PCR: NEGATIVE
Cryptosporidium, PCR: NEGATIVE
E COLI (ETEC) LT/ST, PCR: NEGATIVE
E coli (STEC) stx1/stx2, PCR: NEGATIVE
E coli 0157, PCR: NEGATIVE
Giardia lamblia, PCR: NEGATIVE
Norovirus, PCR: NEGATIVE
ROTAVIRUS, PCR: NEGATIVE
Salmonella, PCR: NEGATIVE
Shigella, PCR: NEGATIVE

## 2014-04-13 ENCOUNTER — Encounter: Payer: Self-pay | Admitting: Internal Medicine

## 2014-04-13 ENCOUNTER — Other Ambulatory Visit: Payer: Self-pay | Admitting: *Deleted

## 2014-04-13 MED ORDER — METRONIDAZOLE 500 MG PO TABS: 500.0000 mg | ORAL_TABLET | Freq: Two times a day (BID) | ORAL | Status: DC

## 2014-04-17 ENCOUNTER — Encounter: Payer: Self-pay | Admitting: *Deleted

## 2014-05-01 ENCOUNTER — Telehealth: Payer: Self-pay | Admitting: Gastroenterology

## 2014-05-01 NOTE — Telephone Encounter (Signed)
Spoke with patient and she states she is still having diarrhea. States she completed the Flagyl and then went to her "regular doctor" Dr. Vicenta Aly and was given "Nitrofurunion" x 7  Days which she completed last night. She has not taken anything for the diarrhea. Please, advise.

## 2014-05-01 NOTE — Telephone Encounter (Signed)
Spoke with patient and she is not on any new medications. States she has been on Dexilant for a good while. She has tried Protonix and Nexium in the past. States neither one helped her. She has not taken Prilosec.

## 2014-05-01 NOTE — Telephone Encounter (Signed)
Is she on any new medications?  How long has she been on the [place]? I see she's been on it at least since March of this year, but it can sometimes cause diarrhea. We may want to hold that and see if the diarrhea gets better and may need to replace it with a different PPI. Which other PPIs that she try the past?  Thank you,  Jess

## 2014-05-01 NOTE — Telephone Encounter (Signed)
My only thought at this point is to hold the Dexilant and see if that could be the cause of her diarrhea.  She should notice some improvement within a few days of holding the Dexilant if that is the case. We would then have to try her on Prilosec 40 mg twice daily likely in place of that if needed.

## 2014-05-05 MED ORDER — ONDANSETRON HCL 4 MG PO TABS: 4.0000 mg | ORAL_TABLET | Freq: Three times a day (TID) | ORAL | Status: DC | PRN

## 2014-05-05 NOTE — Telephone Encounter (Signed)
We can give her a small amount of Zofran but when I saw her last time her UGI issues were doing well so not sure what has changed.  Please make sure she has a non-urgent follow-up with Dr. Deatra Ina.  Thank you,  Jess

## 2014-05-05 NOTE — Telephone Encounter (Signed)
Spoke with patient and she will try holding the [place] for several days then call back with update. She is asking for something for nausea now. Please, advise.

## 2014-05-05 NOTE — Telephone Encounter (Signed)
Rx sent. Scheduled OV on 06/02/14 at 2:30 PM with Dr. Deatra Ina. Patient notified of recommendations.

## 2014-05-08 ENCOUNTER — Ambulatory Visit: Payer: Medicare Other | Admitting: Internal Medicine

## 2014-05-22 ENCOUNTER — Encounter: Payer: Medicare Other | Admitting: Internal Medicine

## 2014-05-22 NOTE — Progress Notes (Signed)
This encounter was created in error - please disregard.

## 2014-05-28 ENCOUNTER — Encounter: Payer: Self-pay | Admitting: Gastroenterology

## 2014-05-28 ENCOUNTER — Encounter: Payer: Self-pay | Admitting: Internal Medicine

## 2014-06-02 ENCOUNTER — Ambulatory Visit: Payer: Medicare Other | Admitting: Gastroenterology

## 2014-06-04 ENCOUNTER — Encounter: Payer: Self-pay | Admitting: Gastroenterology

## 2014-06-04 ENCOUNTER — Ambulatory Visit (INDEPENDENT_AMBULATORY_CARE_PROVIDER_SITE_OTHER): Payer: Medicare Other | Admitting: Gastroenterology

## 2014-06-04 ENCOUNTER — Other Ambulatory Visit: Payer: Medicare Other

## 2014-06-04 VITALS — BP 110/66 | HR 60 | Ht 66.0 in | Wt 186.0 lb

## 2014-06-04 DIAGNOSIS — I2510 Atherosclerotic heart disease of native coronary artery without angina pectoris: Secondary | ICD-10-CM

## 2014-06-04 DIAGNOSIS — B182 Chronic viral hepatitis C: Secondary | ICD-10-CM

## 2014-06-04 DIAGNOSIS — K648 Other hemorrhoids: Secondary | ICD-10-CM

## 2014-06-04 DIAGNOSIS — K219 Gastro-esophageal reflux disease without esophagitis: Secondary | ICD-10-CM

## 2014-06-04 MED ORDER — PANTOPRAZOLE SODIUM 40 MG PO TBEC: 40.0000 mg | DELAYED_RELEASE_TABLET | Freq: Two times a day (BID) | ORAL | Status: DC

## 2014-06-04 MED ORDER — HYDROCORTISONE ACETATE 25 MG RE SUPP: 25.0000 mg | Freq: Two times a day (BID) | RECTAL | Status: DC

## 2014-06-04 NOTE — Progress Notes (Signed)
      History of Present Illness:  [name] [name] has returned for followup of diarrhea and heartburn.  Diarrhea has subsided since discontinuing dexilant.  She's not having moderately severe pyrosis that may occur any time throughout the day despite taking Pepcid.  She's also complaining of rectal burning and protrusion of hemorrhoids.  She's tried preparation H.    Review of Systems: Pertinent positive and negative review of systems were noted in the above HPI section. All other review of systems were otherwise negative.    Current Medications, Allergies, Past Medical History, Past Surgical History, Family History and Social History were reviewed in [facility] record  Vital signs were reviewed in today's medical record. Physical Exam: General: Well developed , well nourished, no acute distress Skin: anicteric Head: Normocephalic and atraumatic Eyes:  sclerae anicteric, EOMI Ears: Normal auditory acuity Mouth: No deformity or lesions Lungs: Clear throughout to auscultation Heart: Regular rate and rhythm; no murmurs, rubs or bruits Abdomen: Soft, non tender and non distended. No masses, hepatosplenomegaly or hernias noted. Normal Bowel sounds Rectal: External skin tags are present Musculoskeletal: Symmetrical with no gross deformities  Pulses:  Normal pulses noted Extremities: No clubbing, cyanosis, edema or deformities noted Neurological: Alert oriented x 4, grossly nonfocal Psychological:  Alert and cooperative. Normal mood and affect  See Assessment and Plan under Problem List

## 2014-06-04 NOTE — Assessment & Plan Note (Signed)
The patient is symptomatic again on Pepcid only.    Recommendations #1 trial of Protonix 40 mg one to 2 times a day

## 2014-06-04 NOTE — Assessment & Plan Note (Signed)
Plan to repeat a quantitative RNA polymerized.  If positive I will refer her to infectious disease for treatment

## 2014-06-04 NOTE — Patient Instructions (Signed)
We will send in a prescription to your pharmacy Go to the basement for labs today

## 2014-06-04 NOTE — Assessment & Plan Note (Signed)
Plan Anusol HC suppositories.  Failing this, would consider band ligation of internal hemorrhoids

## 2014-06-05 ENCOUNTER — Other Ambulatory Visit: Payer: Self-pay | Admitting: Gastroenterology

## 2014-06-05 LAB — HEPATITIS C RNA QUANTITATIVE: HCV QUANT: NOT DETECTED [IU]/mL (ref <15)

## 2014-06-08 ENCOUNTER — Telehealth: Payer: Self-pay | Admitting: *Deleted

## 2014-06-08 NOTE — Telephone Encounter (Signed)
Called patient to inform labs are negative for Hep C  Told her to call as needed

## 2014-06-08 NOTE — Telephone Encounter (Signed)
Message copied by Oda Kilts on Mon Jun 08, 2014  2:36 PM ------      Message from: Erskine Emery D      Created: Mon Jun 08, 2014 11:02 AM       Please inform patient that she no longer has hepatitis C and therefore does not require any further therapy ------

## 2014-06-08 NOTE — Progress Notes (Signed)
Quick Note:  Please inform patient that she no longer has hepatitis C and therefore does not require any further therapy ______

## 2014-06-11 ENCOUNTER — Encounter: Payer: Self-pay | Admitting: Internal Medicine

## 2014-07-05 NOTE — Progress Notes (Signed)
HPIShe has a hx of minimal CAD by cath in 2005 (LAD 40%), DM2, HL, GERD, HTN, chronic HCV, carotid stenosis, chest pain and palpitations. Myoview 3/12: EF 70%, no ischemia. Carotid Dopplers 10/10: RICA 40-59%. She has required esophageal dilatation in the past. She is followed at the [facility] for [facility].  Echo demonstrated normal LVF, mild AS, mild LVH She had knee surgery last year  Post procedure developed a PE I saw her last in 2014 (August) Since I saw her she has been evaluated in pulmonary.  She is now on oxygen for restrictive lung disease.  She denies CP  Breathing hsa been stable on oxygen.    Wt Readings from Last 3 Encounters    Allergies   Allergen  Reactions   .  Sunflower Oil  Anaphylaxis   .  Iodine  Nausea And Vomiting   .  Metoclopramide Hcl  Other (See Comments)     Nerves tense up, panic attack   .  Metronidazole  Other (See Comments)     unknown   .  Sulfamethoxazole  Nausea And Vomiting    Current Outpatient Prescriptions   Medication  Sig  Dispense  Refill   .  albuterol (PROVENTIL HFA) 108 (90 BASE) MCG/ACT inhaler  Inhale 2 puffs into the lungs every 6 (six) hours as needed. For shortness of breath     .  ARIPiprazole (ABILIFY) 2 MG tablet  Take 2 mg by mouth daily before        Allergies  Allergen Reactions  . Sunflower Oil Anaphylaxis  . Iodine Nausea And Vomiting    Fish causes n/v  . Metoclopramide Hcl Other (See Comments)    Nerves tense up, panic attack  . Sulfamethoxazole Nausea And Vomiting    Current Outpatient Prescriptions  Medication Sig Dispense Refill  . aspirin 325 MG tablet Take 1 tablet (325 mg total) by mouth daily.    . fenofibrate (TRICOR) 145 MG tablet Take 145 mg by mouth daily.    [name] gabapentin (NEURONTIN) 300 MG capsule Take 300 mg by mouth 2 (two) times daily.    [name] glimepiride (AMARYL) 4 MG tablet Take 4 mg by mouth every morning.     [name] HYDROcodone-acetaminophen (NORCO) 7.5-325 MG per tablet Take 1 tablet by mouth  2 (two) times daily.     . ondansetron (ZOFRAN) 4 MG tablet TAKE 1 TABLET BY MOUTH EVERY 8 HOURS AS NEEDED FOR FOR NAUSEA AND VOMITING 15 tablet 0  . OXYGEN-HELIUM IN Inhale 2 L into the lungs daily.    . pantoprazole (PROTONIX) 40 MG tablet Take 1 tablet (40 mg total) by mouth 2 (two) times daily. 30 tablet 3  . PARoxetine (PAXIL-CR) 25 MG 24 hr tablet Take 50 mg by mouth at bedtime.     . RESTASIS 0.05 % ophthalmic emulsion Place 1 drop into both eyes 2 (two) times daily.     . solifenacin (VESICARE) 5 MG tablet Take 5 mg by mouth daily.     . traMADol (ULTRAM) 50 MG tablet Take 50 mg by mouth 3 (three) times daily.     . traZODone (DESYREL) 50 MG tablet      No current facility-administered medications for this visit.    Past Medical History  Diagnosis Date  . Hyperlipidemia   . Benign neoplasm of stomach   . Dizziness and giddiness   . Gastroparesis   . Esophageal reflux   . CAD (coronary artery disease)     a.  cath 2005: LAD 40%;  b. Myoview 3/12: EF 70%, no ischemia;  c. Myoview 9/13: EF 76%, no ischemia  . HTN (hypertension)   . Palpitations   . Depression   . Aortic stenosis     Echo 8/13: EF 55-60%, Mild LVH, mild AS, mean gradient 12 mmHg,   . CHF (congestive heart failure) 06-13-12    episode after Cancer surgery  . PONV (postoperative nausea and vomiting)   . DM2 (diabetes mellitus, type 2) 06-13-12    NIDDM  . Ovarian cancer 06-13-12    surgery only x2-no recent problems  . Chronic hepatitis C without mention of hepatic coma 06-13-12    "states is active"  . Sleep apnea 06-13-12    does not use cpap, cpap setting of 2 when used cpap    Past Surgical History  Procedure Laterality Date  . Tubal ligation    . Abdominal hysterectomy    . Oophorectomy      1 ovary removed, then later last ovary removed  . Tonsillectomy  age 41  . Bladder suspension  yrs ago    with posterior repair  . Knee arthroscopy      left  . Cataract extraction Bilateral 03/2013  .  Breast biopsy  1972    left  . Abdominal hysterectomy  yrs ago  . Cholecystectomy  5 yrs ago  . Colonoscopy N/A 08/11/2013    Procedure: COLONOSCOPY;  Surgeon: Inda Castle, MD;  Location: [facility];  Service: Endoscopy;  Laterality: N/A;    Family History  Problem Relation Age of Onset  . Brain cancer Father     spine cancer  . Heart disease Mother   . Breast cancer Sister   . Kidney disease Sister   . Diabetes Brother   . Esophageal cancer Brother   . Diabetes Maternal Aunt   . Diabetes Paternal Aunt   . Stomach cancer Paternal Grandfather     History   Social History  . Marital Status: Widowed    Spouse Name: N/A    Number of Children: 3  . Years of Education: N/A   Occupational History  . Disabled     memory loss   Social History Main Topics  . Smoking status: Never Smoker   . Smokeless tobacco: Never Used  . Alcohol Use: No     Comment: past hx. , no current use in 11 yrs.  . Drug Use: Yes    Special: "Crack" cocaine     Comment: last used 2002-crack cocaine-remains a nonuser  . Sexual Activity: No   Other Topics Concern  . Not on file   Social History Narrative  . No narrative on file    Review of Systems:  All systems reviewed.  They are negative to the above problem except as previously stated.  Vital Signs: BP 112/60 mmHg  Pulse 85  Ht 5\' 6"  (1.676 m)  Wt 189 lb (85.73 kg)  BMI 30.52 kg/m2  Physical Exam Patient is in NAD  HEENT:  Normocephalic, atraumatic. EOMI, PERRLA.  Neck: JVP is normal.  No bruits.  Lungs:  Bilateral wheezes Heart: Regular rate and rhythm. Normal S1, S2. No S3.  Gr II/VI systolic murmur PMI not displaced.  Abdomen:  Supple, nontender. Normal bowel sounds. No masses. No hepatomegaly.  Extremities:   Good distal pulses throughout.lower extremity edema.  Musculoskeletal :moving all extremities.  Neuro:   alert and oriented x3.  CN II-XII grossly intact.  EKG  SR 85  Incomplete RBBB  LVH   Assessment and  Plan:  1. Dyspnea.  Patient is now on oxygen  Follows in pulm    2.  CAD  No evid for active ischemia  Continue meds    3  HTN  Adequate control.  4.  Dyslipidemia  Will need to review recent labs

## 2014-07-06 ENCOUNTER — Ambulatory Visit (INDEPENDENT_AMBULATORY_CARE_PROVIDER_SITE_OTHER): Payer: Medicare Other | Admitting: Internal Medicine

## 2014-07-06 VITALS — BP 112/60 | HR 85 | Ht 66.0 in | Wt 189.0 lb

## 2014-07-06 DIAGNOSIS — I10 Essential (primary) hypertension: Secondary | ICD-10-CM

## 2014-07-06 DIAGNOSIS — I2510 Atherosclerotic heart disease of native coronary artery without angina pectoris: Secondary | ICD-10-CM

## 2014-07-06 NOTE — Patient Instructions (Addendum)
Your physician recommends that you continue on your current medications as directed. Please refer to the Current Medication list given to you today. Your physician recommends that you return for lab work Wednesday 07/08/14 (BMET, CBC) Your physician wants you to follow-up in: 6 months with Dr. Harrington Challenger.  You will receive a reminder letter in the mail two months in advance. If you don't receive a letter, please call our office to schedule the follow-up appointment.

## 2014-07-08 ENCOUNTER — Other Ambulatory Visit (INDEPENDENT_AMBULATORY_CARE_PROVIDER_SITE_OTHER): Payer: Medicare Other | Admitting: *Deleted

## 2014-07-08 DIAGNOSIS — I10 Essential (primary) hypertension: Secondary | ICD-10-CM

## 2014-07-08 DIAGNOSIS — I2510 Atherosclerotic heart disease of native coronary artery without angina pectoris: Secondary | ICD-10-CM

## 2014-07-08 LAB — CBC
HCT: 34.3 % — ABNORMAL LOW (ref 36.0–46.0)
Hemoglobin: 11.6 g/dL — ABNORMAL LOW (ref 12.0–15.0)
MCHC: 33.8 g/dL (ref 30.0–36.0)
MCV: 87.1 fl (ref 78.0–100.0)
Platelets: 286 10*3/uL (ref 150.0–400.0)
RBC: 3.94 Mil/uL (ref 3.87–5.11)
RDW: 13.4 % (ref 11.5–15.5)
WBC: 5.8 10*3/uL (ref 4.0–10.5)

## 2014-07-08 LAB — BASIC METABOLIC PANEL
BUN: 16 mg/dL (ref 6–23)
CALCIUM: 9.2 mg/dL (ref 8.4–10.5)
CO2: 22 mEq/L (ref 19–32)
Chloride: 109 mEq/L (ref 96–112)
Creatinine, Ser: 1 mg/dL (ref 0.4–1.2)
GFR: 58.97 mL/min — ABNORMAL LOW (ref >60.00)
Glucose, Bld: 209 mg/dL — ABNORMAL HIGH (ref 70–99)
Potassium: 3.8 mEq/L (ref 3.5–5.1)
Sodium: 142 mEq/L (ref 135–145)

## 2014-08-04 DIAGNOSIS — J9611 Chronic respiratory failure with hypoxia: Secondary | ICD-10-CM | POA: Insufficient documentation

## 2014-09-08 ENCOUNTER — Other Ambulatory Visit: Payer: Self-pay | Admitting: Gastroenterology

## 2014-10-06 ENCOUNTER — Other Ambulatory Visit: Payer: Self-pay | Admitting: Gastroenterology

## 2014-10-23 ENCOUNTER — Other Ambulatory Visit: Payer: Self-pay | Admitting: Gastroenterology

## 2014-11-15 ENCOUNTER — Other Ambulatory Visit: Payer: Self-pay | Admitting: Gastroenterology

## 2014-11-20 ENCOUNTER — Emergency Department (HOSPITAL_COMMUNITY): Payer: Medicare Other

## 2014-11-20 ENCOUNTER — Emergency Department (HOSPITAL_COMMUNITY)
Admission: EM | Admit: 2014-11-20 | Discharge: 2014-11-21 | Disposition: A | Discharge status: Home / Self Care | Payer: Medicare Other | Attending: Emergency Medicine | Admitting: Emergency Medicine

## 2014-11-20 ENCOUNTER — Encounter (HOSPITAL_COMMUNITY): Payer: Self-pay | Admitting: Emergency Medicine

## 2014-11-20 DIAGNOSIS — E119 Type 2 diabetes mellitus without complications: Secondary | ICD-10-CM | POA: Diagnosis not present

## 2014-11-20 DIAGNOSIS — Z8619 Personal history of other infectious and parasitic diseases: Secondary | ICD-10-CM | POA: Insufficient documentation

## 2014-11-20 DIAGNOSIS — R05 Cough: Secondary | ICD-10-CM

## 2014-11-20 DIAGNOSIS — E785 Hyperlipidemia, unspecified: Secondary | ICD-10-CM | POA: Insufficient documentation

## 2014-11-20 DIAGNOSIS — J209 Acute bronchitis, unspecified: Secondary | ICD-10-CM | POA: Diagnosis not present

## 2014-11-20 DIAGNOSIS — F329 Major depressive disorder, single episode, unspecified: Secondary | ICD-10-CM | POA: Insufficient documentation

## 2014-11-20 DIAGNOSIS — Z8543 Personal history of malignant neoplasm of ovary: Secondary | ICD-10-CM | POA: Insufficient documentation

## 2014-11-20 DIAGNOSIS — Z7982 Long term (current) use of aspirin: Secondary | ICD-10-CM | POA: Insufficient documentation

## 2014-11-20 DIAGNOSIS — D131 Benign neoplasm of stomach: Secondary | ICD-10-CM | POA: Diagnosis not present

## 2014-11-20 DIAGNOSIS — I2510 Atherosclerotic heart disease of native coronary artery without angina pectoris: Secondary | ICD-10-CM | POA: Insufficient documentation

## 2014-11-20 DIAGNOSIS — I10 Essential (primary) hypertension: Secondary | ICD-10-CM | POA: Insufficient documentation

## 2014-11-20 DIAGNOSIS — Z79899 Other long term (current) drug therapy: Secondary | ICD-10-CM | POA: Diagnosis not present

## 2014-11-20 DIAGNOSIS — I509 Heart failure, unspecified: Secondary | ICD-10-CM | POA: Diagnosis not present

## 2014-11-20 DIAGNOSIS — R059 Cough, unspecified: Secondary | ICD-10-CM

## 2014-11-20 LAB — I-STAT CHEM 8, ED
BUN: 15 mg/dL (ref 6–23)
CHLORIDE: 107 mmol/L (ref 96–112)
Calcium, Ion: 1.16 mmol/L (ref 1.13–1.30)
Creatinine, Ser: 0.8 mg/dL (ref 0.50–1.10)
Glucose, Bld: 286 mg/dL — ABNORMAL HIGH (ref 70–99)
HCT: 40 % (ref 36.0–46.0)
Hemoglobin: 13.6 g/dL (ref 12.0–15.0)
Potassium: 3.8 mmol/L (ref 3.5–5.1)
Sodium: 144 mmol/L (ref 135–145)
TCO2: 21 mmol/L (ref 0–100)

## 2014-11-20 NOTE — ED Notes (Signed)
Patient reports cough ("I feel like it's going down deeper in my chest") and generalized body aches (legs, back especially with cough) starting yesterday. Denies recent treatment for any illnesses. Denies fevers but appears diaphoretic. Says sputum has been "white." Endorses sinus congestion. No other c/c.

## 2014-11-21 LAB — CBC
HCT: 39.1 % (ref 36.0–46.0)
Hemoglobin: 12.6 g/dL (ref 12.0–15.0)
MCH: 29.2 pg (ref 26.0–34.0)
MCHC: 32.2 g/dL (ref 30.0–36.0)
MCV: 90.7 fL (ref 78.0–100.0)
Platelets: 224 10*3/uL (ref 150–400)
RBC: 4.31 MIL/uL (ref 3.87–5.11)
RDW: 13 % (ref 11.5–15.5)
WBC: 6.3 10*3/uL (ref 4.0–10.5)

## 2014-11-21 MED ORDER — AEROCHAMBER PLUS FLO-VU LARGE MISC
1.0000 | Freq: Once | Status: AC
  Administered 2014-11-21: 1
  Filled 2014-11-21 (×2): qty 1

## 2014-11-21 MED ORDER — IPRATROPIUM BROMIDE 0.02 % IN SOLN
0.5000 mg | Freq: Once | RESPIRATORY_TRACT | Status: AC
  Administered 2014-11-21: 0.5 mg via RESPIRATORY_TRACT
  Filled 2014-11-21: qty 2.5

## 2014-11-21 MED ORDER — BENZONATATE 100 MG PO CAPS: 100.0000 mg | ORAL_CAPSULE | Freq: Three times a day (TID) | ORAL | Status: DC

## 2014-11-21 MED ORDER — ALBUTEROL SULFATE (2.5 MG/3ML) 0.083% IN NEBU
5.0000 mg | INHALATION_SOLUTION | Freq: Once | RESPIRATORY_TRACT | Status: AC
  Administered 2014-11-21: 5 mg via RESPIRATORY_TRACT
  Filled 2014-11-21: qty 6

## 2014-11-21 MED ORDER — AZITHROMYCIN 250 MG PO TABS: 250.0000 mg | ORAL_TABLET | Freq: Every day | ORAL | Status: DC

## 2014-11-21 MED ORDER — ALBUTEROL SULFATE HFA 108 (90 BASE) MCG/ACT IN AERS
2.0000 | INHALATION_SPRAY | Freq: Once | RESPIRATORY_TRACT | Status: AC
  Administered 2014-11-21: 2 via RESPIRATORY_TRACT
  Filled 2014-11-21: qty 6.7

## 2014-11-21 NOTE — ED Notes (Signed)
Pt ambulated with RN on 3liter o2 via [place] which is pt's baseline at home. Oxygen maintained between 97-100% and pt tolerated activity well. Pt ambulated to restroom with no shortness of breath observed. Pt currently back in room and on wall o2 at 3liters

## 2014-11-21 NOTE — Discharge Instructions (Signed)
Using albuterol inhaler, 2 puffs every 4 hours, as needed for cough and shortness of breath or wheezing. Take Tessalon as prescribed for cough and azithromycin to cover for a bacterial infection. Follow-up with a primary care provider in 3 days for a recheck of symptoms. Return to the emergency department as needed if symptoms worsen.  Acute Bronchitis Bronchitis is inflammation of the airways that extend from the windpipe into the lungs (bronchi). The inflammation often causes mucus to develop. This leads to a cough, which is the most common symptom of bronchitis.  In acute bronchitis, the condition usually develops suddenly and goes away over time, usually in a couple weeks. Smoking, allergies, and asthma can make bronchitis worse. Repeated episodes of bronchitis may cause further lung problems.  CAUSES Acute bronchitis is most often caused by the same virus that causes a cold. The virus can spread from person to person (contagious) through coughing, sneezing, and touching contaminated objects. SIGNS AND SYMPTOMS   Cough.   Fever.   Coughing up mucus.   Body aches.   Chest congestion.   Chills.   Shortness of breath.   Sore throat.  DIAGNOSIS  Acute bronchitis is usually diagnosed through a physical exam. Your health care provider will also ask you questions about your medical history. Tests, such as chest X-rays, are sometimes done to rule out other conditions.  TREATMENT  Acute bronchitis usually goes away in a couple weeks. Oftentimes, no medical treatment is necessary. Medicines are sometimes given for relief of fever or cough. Antibiotic medicines are usually not needed but may be prescribed in certain situations. In some cases, an inhaler may be recommended to help reduce shortness of breath and control the cough. A cool mist vaporizer may also be used to help thin bronchial secretions and make it easier to clear the chest.  HOME CARE INSTRUCTIONS  Get plenty of rest.    Drink enough fluids to keep your urine clear or pale yellow (unless you have a medical condition that requires fluid restriction). Increasing fluids may help thin your respiratory secretions (sputum) and reduce chest congestion, and it will prevent dehydration.   Take medicines only as directed by your health care provider.  If you were prescribed an antibiotic medicine, finish it all even if you start to feel better.  Avoid smoking and secondhand smoke. Exposure to cigarette smoke or irritating chemicals will make bronchitis worse. If you are a smoker, consider using nicotine gum or skin patches to help control withdrawal symptoms. Quitting smoking will help your lungs heal faster.   Reduce the chances of another bout of acute bronchitis by washing your hands frequently, avoiding people with cold symptoms, and trying not to touch your hands to your mouth, nose, or eyes.   Keep all follow-up visits as directed by your health care provider.  SEEK MEDICAL CARE IF: Your symptoms do not improve after 1 week of treatment.  SEEK IMMEDIATE MEDICAL CARE IF:  You develop an increased fever or chills.   You have chest pain.   You have severe shortness of breath.  You have bloody sputum.   You develop dehydration.  You faint or repeatedly feel like you are going to pass out.  You develop repeated vomiting.  You develop a severe headache. MAKE SURE YOU:   Understand these instructions.  Will watch your condition.  Will get help right away if you are not doing well or get worse. Document Released: 09/21/2004 Document Revised: 12/29/2013 Document Reviewed: 02/04/2013 ExitCare Patient  Information 2015 [place], [place]. This information is not intended to replace advice given to you by your health care provider. Make sure you discuss any questions you have with your health care provider.   Emergency Department Resource Guide 1) Find a Doctor and Pay Out of Pocket Although you  won't have to find out who is covered by your insurance plan, it is a good idea to ask around and get recommendations. You will then need to call the office and see if the doctor you have chosen will accept you as a new patient and what types of options they offer for patients who are self-pay. Some doctors offer discounts or will set up payment plans for their patients who do not have insurance, but you will need to ask so you aren't surprised when you get to your appointment.  2) Contact Your Local Health Department Not all health departments have doctors that can see patients for sick visits, but many do, so it is worth a call to see if yours does. If you don't know where your local health department is, you can check in your phone book. The CDC also has a tool to help you locate your state's health department, and many state websites also have listings of all of their local health departments.  3) Find a [facility] If your illness is not likely to be very severe or complicated, you may want to try a walk in clinic. These are popping up all over the country in pharmacies, drugstores, and shopping centers. They're usually staffed by nurse practitioners or physician assistants that have been trained to treat common illnesses and complaints. They're usually fairly quick and inexpensive. However, if you have serious medical issues or chronic medical problems, these are probably not your best option.  No Primary Care Doctor: - Call Health Connect at  [phone] - they can help you locate a primary care doctor that  accepts your insurance, provides certain services, etc. - Physician Referral Service- [phone]  Chronic Pain Problems: Organization         Address  Phone   Notes  [facility]  [phone] Patients need to be referred by their primary care doctor.   Medication Assistance: Organization         Address  Phone   Notes  [facility] Med[facility] [place] [place], [place] [phone] --Must be a resident of [facility] -- Must have NO insurance coverage whatsoever (no Medicaid/ Medicare, etc.) -- The pt. MUST have a primary care doctor that directs their care regularly and follows them in the community   [facility]  [phone]   [employer]  [phone]    Agencies that provide inexpensive medical care: Organization         Address  Phone   Notes  [place]  [phone]   [facility]    [phone]   [facility] [place] [place], [place] [address] [phone]   [place] [address] [place] [phone]   Planned Parenthood    [phone]   [facility]    [phone]   [place] and [place] [address], [place] Phone:  [phone], Fax:  [phone] Hours of Operation:  9 am - 6 pm, M-F.  Also accepts Medicaid/Medicare and self-pay.  [facility]  [address], [place] Phone: [phone], Fax: [phone]. Hours of Operation:  8:30 am - 5:30 pm, M-F.  Also accepts Medicaid and self-pay.  [facility] [address], [place] Phone: [phone]   [place], [place], [place] [phone], Ext. [phone] Mondays & Thursdays: 7-9 AM.  First 15 patients are seen on a first come, first serve basis.    Wise Providers:  Organization         Address  Phone   Notes  [facility] [address],  [phone] Also accepts self-pay patients.  [facility] [address], [place]  [phone]   [place], [address], [place] [phone]   [facility] [address] [place] [phone]   Lucianne Lei [address], [place]   [phone] Only accepts [place] Access [place] patients after they have their name applied to their card.   Self-Pay (no insurance) in [facility]:  Organization         Address  Phone   Notes  Sickle Cell Patients, [facility] [place] [phone]   [facility] [place] [phone]   [facility] [place]  [place], [place], [place] [phone]   [facility]/Dr. Osei-Bonsu  [address] [place] [address], [place] [phone] Phone number for both [place] and [place] locations is the same.  [facility] [address], [place] [phone]   [facility] [address], [place] or [address] [phone]   [facility] [address], [place] [phone], phone; [phone], fax Sees patients 1st and 3rd Saturday of every month.  Must not qualify for public or private insurance (i.e. Medicaid, Medicare, [place] Health Choice, Veterans' Benefits)  Household income should be no more than 200% of the poverty level The clinic cannot treat you if you are pregnant or think you are pregnant  Sexually transmitted diseases are not treated at the clinic.    Dental Care: Organization         Address  Phone  Notes  [facility] [place] [phone] Accepts children up to age 65 who are enrolled in [place] or [place]; pregnant women with a Medicaid card; and children who have applied for Medicaid or [place] Health Choice, but were declined, whose parents can pay a reduced fee at time of service.  [facility] Department of [facility]  [address], [place] [phone] Accepts  children up to age 86 who are enrolled in [place] or [place]; pregnant women with a Medicaid card; and children who have applied for Medicaid or [place] Health Choice, but were declined, whose parents can pay a reduced fee at time of service.  [place] [facility]  [place] [phone] Patients are seen by appointment only. Walk-ins are not accepted. [place] will see patients 65 years of age and older. Monday - Tuesday (8am-5pm) Most Wednesdays (8:30-5pm) $30 per visit, cash only  [place] [facility]  [address], [facility] [phone] Patients are seen by appointment only. Walk-ins are not accepted. [place] will see patients 60 years of age and older. One Wednesday Evening (Monthly: Volunteer Based).  $30 per visit, cash only  [place]  [phone] for adults; Children under age 29, call Graduate Pediatric Dentistry at [phone]. Children aged 47-14, please call [phone] to request a pediatric application.  Dental services are provided in all areas of dental care including fillings, crowns and bridges, complete and partial dentures, implants, gum treatment, root canals, and extractions. Preventive care is also provided. Treatment is provided to both adults and children. Patients are selected via a lottery and there is often a waiting list.   [facility] [address] [place]  [phone] [email or web address]   [facility] [address] [place], [place] [phone], Ext. [phone] Second and Fourth Thursday of each month, opens at 6:30 AM; Clinic ends at 9 AM.  Patients are seen on a first-come first-served basis, and a limited number are seen during each clinic.   [facility]  [address] Hillard Danker [place], [place] [phone]   Eligibility Requirements You must have lived in [place], [place], or [place] counties for at least the  last three months.   You cannot be eligible for state or federal sponsored [employer], including [employer], [place], or [employer].   You generally cannot be eligible for healthcare insurance through your employer.    How to apply: Eligibility screenings are held every Tuesday and Wednesday afternoon from 1:00 pm until 4:00 pm. You do not need an appointment for the interview!  [facility] [address], [place], [place]   [place]  [place] Department  [place]  [phone]    Behavioral Health Resources in the Community: Intensive Outpatient Programs Organization         Address  Phone  Notes  [place]. [address] [place], [place] [phone]   [facility] [address], [place], [place]   [facility] [address] [place], [place]   [place] [address]  [place], [place] or [phone]   Substance Abuse Resources Organization         Address  Phone  Notes  [facility] Drug [facility]  [phone]   [place]  [phone]   [facility] [place]   Chinita Pester  [phone]   Residential & Outpatient Substance Abuse Program  [phone]   Psychological Services Organization         Address  Phone  Notes  [facility] [place]  [place]  [phone]   [place] [address], [place] [phone] or [phone]    Mobile Crisis Teams Organization         Address  Phone  Notes  [facility], Mobile Crisis Care Unit  [phone]   [facility]  [address] [place], [place]   [place] [address] [place] [phone]     Self-Help/Support Groups Organization         Address  Phone             Notes  [facility] [place] - variety of support groups  [place] Call for more information  Narcotics Anonymous (NA), [facility] [address], [employer] [place]  2 meetings at this location   Special educational needs teacher         Address  Phone  Notes  [facility] [place],    [place]  [phone]   [facility]  [address] [place] [address]10, [place], [place]   [place], [place] [phone] Admissions: 8am-3pm M-F  Incentives Substance [place] [address],    [place], [place] [phone]   [facility] [address] [place], [place], [place]   [facility] [address],  [place], [place]   [facility] - Intensive Outpatient [address] Kristeen [address], [place], [place]   [facility] ([place].,  [place], [place] [phone] or [phone]   [facility] (RT[facility]) [address], [place], [place] Accepts Medicaid  [facility] [place] [address]  [place] [phone] Substance Abuse/Addiction Treatment   [facility] Organization         Address  Phone  Notes  [facility]  [phone]   Domenic Schwab, PhD [address] [place]lis [place], [place]   [phone] or [phone]   [place], [place] [phone]   [facility] [address], [place], [place] [phone] Insurance/Medicaid/sponsorship through [facility] [address] [place]                                    [place], [place] [phone] [place] [address], [place] [phone]    Dr. Adele Schilder  [phone]   [facility]  [facility] [place] Dept. 1) [address] [place] 2) [place] 3)  [place] [address], [place] [phone]  [phone]   [place] [phone] or [phone] (After Hours)

## 2014-11-21 NOTE — ED Provider Notes (Signed)
CSN: [id number]     Arrival date & time 11/20/14  2303 History   First MD Initiated Contact with Patient 11/21/14 0028     Chief Complaint  Patient presents with  . Cough  . Generalized Body Aches    (Consider location/radiation/quality/duration/timing/severity/associated sxs/prior Treatment) HPI Comments: Patient is a 64 year old female with a history of hyperlipidemia, gastroparesis, CAD, hypertension, CHF, DM, and use of chronic home O2 at 2L (patient reports for COPD). She presents to the emergency department for further evaluation of cough and body aches. Patient states that her cough began 2 weeks ago and has been worsening over the past 2 days. Cough productive of "white" sputum. She states that she has been experiencing pain in her back and sides when coughing. She has had associated nasal congestion and postnasal drip which has been causing the patient to experience nausea. She also reports wheezing which is mostly present with coughing. Patient has taken Alka-Seltzer and Emergen-C without relief. She denies hemoptysis, syncope, fever, vomiting, or sick contacts.   Patient is a 64 y.o. female presenting with cough. The history is provided by the patient. No language interpreter was used.  Cough Associated symptoms: shortness of breath and wheezing   Associated symptoms: no fever and no sore throat     Past Medical History  Diagnosis Date  . Hyperlipidemia   . Benign neoplasm of stomach   . Dizziness and giddiness   . Gastroparesis   . Esophageal reflux   . CAD (coronary artery disease)     a.  cath 2005: LAD 40%;  b. Myoview 3/12: EF 70%, no ischemia;  c. Myoview 9/13: EF 76%, no ischemia  . HTN (hypertension)   . Palpitations   . Depression   . Aortic stenosis     Echo 8/13: EF 55-60%, Mild LVH, mild AS, mean gradient 12 mmHg,   . CHF (congestive heart failure) 06-13-12    episode after Cancer surgery  . PONV (postoperative nausea and vomiting)   . DM2 (diabetes mellitus,  type 2) 06-13-12    NIDDM  . Ovarian cancer 06-13-12    surgery only x2-no recent problems  . Chronic hepatitis C without mention of hepatic coma 06-13-12    "states is active"  . Sleep apnea 06-13-12    does not use cpap, cpap setting of 2 when used cpap   Past Surgical History  Procedure Laterality Date  . Tubal ligation    . Abdominal hysterectomy    . Oophorectomy      1 ovary removed, then later last ovary removed  . Tonsillectomy  age 44  . Bladder suspension  yrs ago    with posterior repair  . Knee arthroscopy      left  . Cataract extraction Bilateral 03/2013  . Breast biopsy  1972    left  . Abdominal hysterectomy  yrs ago  . Cholecystectomy  5 yrs ago  . Colonoscopy N/A 08/11/2013    Procedure: COLONOSCOPY;  Surgeon: Inda Castle, MD;  Location: [facility];  Service: Endoscopy;  Laterality: N/A;   Family History  Problem Relation Age of Onset  . Brain cancer Father     spine cancer  . Heart disease Mother   . Breast cancer Sister   . Kidney disease Sister   . Diabetes Brother   . Esophageal cancer Brother   . Diabetes Maternal Aunt   . Diabetes Paternal Aunt   . Stomach cancer Paternal Grandfather    History  Substance Use  Topics  . Smoking status: Never Smoker   . Smokeless tobacco: Never Used  . Alcohol Use: No     Comment: past hx. , no current use in 11 yrs.   OB History    No data available      Review of Systems  Constitutional: Negative for fever.  HENT: Positive for congestion and postnasal drip. Negative for sore throat.   Respiratory: Positive for cough, shortness of breath and wheezing.   Gastrointestinal: Positive for nausea. Negative for vomiting.  Neurological: Negative for syncope.  All other systems reviewed and are negative.   Allergies  Sunflower oil; Iodine; Metoclopramide hcl; and Sulfamethoxazole  Home Medications   Prior to Admission medications   Medication Sig Start Date End Date Taking? Authorizing Provider   ARIPiprazole (ABILIFY) 5 MG tablet Take 5 mg by mouth daily.   Yes Historical Provider, MD  aspirin 325 MG tablet Take 1 tablet (325 mg total) by mouth daily. 11/13/13  Yes Orson Eva, MD  DULoxetine (CYMBALTA) 30 MG capsule Take 30 mg by mouth daily.   Yes Historical Provider, MD  gabapentin (NEURONTIN) 300 MG capsule Take 300 mg by mouth 2 (two) times daily.   Yes Historical Provider, MD  gemfibrozil (LOPID) 600 MG tablet Take 600 mg by mouth 2 (two) times daily before a meal.   Yes Historical Provider, MD  glimepiride (AMARYL) 4 MG tablet Take 4 mg by mouth every morning.  06/21/13  Yes Historical Provider, MD  HYDROcodone-acetaminophen (NORCO) 7.5-325 MG per tablet Take 1 tablet by mouth 2 (two) times daily.  07/08/13  Yes Historical Provider, MD  HYDROXYZINE PAMOATE PO Take 25 mg by mouth daily.   Yes Historical Provider, MD  lisinopril (PRINIVIL,ZESTRIL) 20 MG tablet Take 20 mg by mouth daily.   Yes Historical Provider, MD  pantoprazole (PROTONIX) 40 MG tablet TAKE ONE TABLET BY MOUTH TWICE DAILY BEFORE  BREAKFAST  AND  DINNER 11/16/14  Yes Inda Castle, MD  pindolol (VISKEN) 5 MG tablet Take 5 mg by mouth 2 (two) times daily.   Yes Historical Provider, MD  RESTASIS 0.05 % ophthalmic emulsion Place 1 drop into both eyes 2 (two) times daily.  05/20/13  Yes Historical Provider, MD  solifenacin (VESICARE) 5 MG tablet Take 5 mg by mouth daily.    Yes Historical Provider, MD  traMADol (ULTRAM) 50 MG tablet Take 50 mg by mouth every 6 (six) hours as needed for moderate pain.  07/08/13  Yes Historical Provider, MD  fenofibrate (TRICOR) 145 MG tablet Take 145 mg by mouth daily. 11/02/13   Historical Provider, MD  ondansetron (ZOFRAN) 4 MG tablet TAKE 1 TABLET BY MOUTH EVERY 8 HOURS AS NEEDED FOR FOR NAUSEA AND VOMITING 06/08/14   Inda Castle, MD  OXYGEN-HELIUM IN Inhale 2 L into the lungs daily.    Historical Provider, MD  PARoxetine (PAXIL-CR) 25 MG 24 hr tablet Take 50 mg by mouth at bedtime.      Historical Provider, MD  traZODone (DESYREL) 50 MG tablet Take 50 mg by mouth at bedtime as needed for sleep.  05/07/14   Historical Provider, MD   BP 154/87 mmHg  Pulse 90  Temp(Src) 98.6 F (37 C) (Oral)  Resp 18  SpO2 100%   Physical Exam  Constitutional: She is oriented to person, place, and time. She appears well-developed and well-nourished. No distress.  Nontoxic/nonseptic appearing  HENT:  Head: Normocephalic and atraumatic.  Patient tolerating secretions without difficulty.  Eyes: Conjunctivae and EOM  are normal. No scleral icterus.  Neck: Normal range of motion.  Cardiovascular: Normal rate, regular rhythm and intact distal pulses.   Pulmonary/Chest: Effort normal. No respiratory distress. She has wheezes. She has no rales.  Faint expiratory wheezing diffusely. Dry, nonproductive cough appreciated at bedside.  Musculoskeletal: Normal range of motion.  Neurological: She is alert and oriented to person, place, and time. She exhibits normal muscle tone. Coordination normal.  GCS 15. Patient moves extremities without ataxia.  Skin: Skin is warm and dry. No rash noted. She is not diaphoretic. No erythema. No pallor.  Psychiatric: She has a normal mood and affect. Her behavior is normal.  Nursing note and vitals reviewed.   ED Course  Procedures (including critical care time) Labs Review Labs Reviewed  I-STAT CHEM 8, ED - Abnormal; Notable for the following:    Glucose, Bld 286 (*)    All other components within normal limits  CBC    Imaging Review Dg Chest 2 View  11/21/2014   CLINICAL DATA:  Fall, generalized body aches starting yesterday.  EXAM: CHEST  2 VIEW  COMPARISON:  11/11/2013  FINDINGS: Stable elevation of the right hemidiaphragm. Heart is upper limits normal in size. Lungs are clear. No effusions. No acute bony abnormality.  IMPRESSION: No active cardiopulmonary disease.   Electronically Signed   By: Rolm Baptise M.D.   On: 11/21/2014 00:28     EKG  Interpretation None      MDM   Final diagnoses:  Acute bronchitis, unspecified organism  Cough    64 year old female presents to the emergency department for further evaluation of cough and body aches with nasal congestion, postnasal drip, and nausea. Patient is nontoxic and nonseptic appearing as well as afebrile. She has no tachypnea, dyspnea, or hypoxia and is able to ambulate in the ED without hypoxia while on chronic 3L [place]. Labs today revealed no leukocytosis. Patient does have hyperglycemia with glucose of 286; normal anion gap.  Symptoms c/w acute bronchitis. Patient tx in ED with DuoNeb. Will withhold steroids given diabetes history. Outpatient management indicated with albuterol inhaler, tessalon, and Z-pack. Will refer to PCP for f/u. Return precautions discussed and provided. Patient agreeable to plan with no unaddressed concerns. Patient seen also by Dr. Claudine Mouton who is in agreement with this workup, assessment, management plan, and patient's stability for discharge.   Filed Vitals:   11/20/14 2325 11/21/14 0146  BP: 154/75 154/87  Pulse: 87 91  Temp: 98.6 F (37 C)   TempSrc: Oral   Resp: 17 18  SpO2: 96% 98%      Antonietta Breach, PA-C 11/21/14 5300  Everlene Balls, MD 11/21/14 1410

## 2014-11-23 ENCOUNTER — Emergency Department (HOSPITAL_COMMUNITY)
Admission: EM | Admit: 2014-11-23 | Discharge: 2014-11-24 | Disposition: A | Discharge status: Home / Self Care | Payer: Medicare Other | Attending: Emergency Medicine | Admitting: Emergency Medicine

## 2014-11-23 ENCOUNTER — Encounter (HOSPITAL_COMMUNITY): Payer: Self-pay | Admitting: Emergency Medicine

## 2014-11-23 DIAGNOSIS — Z8543 Personal history of malignant neoplasm of ovary: Secondary | ICD-10-CM | POA: Diagnosis not present

## 2014-11-23 DIAGNOSIS — R05 Cough: Secondary | ICD-10-CM | POA: Diagnosis not present

## 2014-11-23 DIAGNOSIS — R0602 Shortness of breath: Secondary | ICD-10-CM | POA: Diagnosis present

## 2014-11-23 DIAGNOSIS — Z8719 Personal history of other diseases of the digestive system: Secondary | ICD-10-CM | POA: Insufficient documentation

## 2014-11-23 DIAGNOSIS — I10 Essential (primary) hypertension: Secondary | ICD-10-CM | POA: Diagnosis not present

## 2014-11-23 DIAGNOSIS — R059 Cough, unspecified: Secondary | ICD-10-CM

## 2014-11-23 DIAGNOSIS — K219 Gastro-esophageal reflux disease without esophagitis: Secondary | ICD-10-CM | POA: Insufficient documentation

## 2014-11-23 DIAGNOSIS — Z79899 Other long term (current) drug therapy: Secondary | ICD-10-CM | POA: Insufficient documentation

## 2014-11-23 DIAGNOSIS — F329 Major depressive disorder, single episode, unspecified: Secondary | ICD-10-CM | POA: Insufficient documentation

## 2014-11-23 DIAGNOSIS — I2510 Atherosclerotic heart disease of native coronary artery without angina pectoris: Secondary | ICD-10-CM | POA: Diagnosis not present

## 2014-11-23 DIAGNOSIS — E119 Type 2 diabetes mellitus without complications: Secondary | ICD-10-CM | POA: Diagnosis not present

## 2014-11-23 DIAGNOSIS — Z86018 Personal history of other benign neoplasm: Secondary | ICD-10-CM | POA: Diagnosis not present

## 2014-11-23 DIAGNOSIS — Z8619 Personal history of other infectious and parasitic diseases: Secondary | ICD-10-CM | POA: Diagnosis not present

## 2014-11-23 DIAGNOSIS — I509 Heart failure, unspecified: Secondary | ICD-10-CM | POA: Insufficient documentation

## 2014-11-23 DIAGNOSIS — Z7982 Long term (current) use of aspirin: Secondary | ICD-10-CM | POA: Diagnosis not present

## 2014-11-23 LAB — CBC
HCT: 39.9 % (ref 36.0–46.0)
Hemoglobin: 13.1 g/dL (ref 12.0–15.0)
MCH: 29.6 pg (ref 26.0–34.0)
MCHC: 32.8 g/dL (ref 30.0–36.0)
MCV: 90.3 fL (ref 78.0–100.0)
PLATELETS: 218 10*3/uL (ref 150–400)
RBC: 4.42 MIL/uL (ref 3.87–5.11)
RDW: 12.9 % (ref 11.5–15.5)
WBC: 6.2 10*3/uL (ref 4.0–10.5)

## 2014-11-23 LAB — BASIC METABOLIC PANEL
ANION GAP: 11 (ref 5–15)
BUN: 14 mg/dL (ref 6–23)
CO2: 23 mmol/L (ref 19–32)
CREATININE: 0.79 mg/dL (ref 0.50–1.10)
Calcium: 9.6 mg/dL (ref 8.4–10.5)
Chloride: 109 mmol/L (ref 96–112)
GFR calc Af Amer: >90 mL/min (ref >90)
GFR calc non Af Amer: 87 mL/min — ABNORMAL LOW (ref >90)
Glucose, Bld: 288 mg/dL — ABNORMAL HIGH (ref 70–99)
Potassium: 3.8 mmol/L (ref 3.5–5.1)
Sodium: 143 mmol/L (ref 135–145)

## 2014-11-23 NOTE — ED Notes (Signed)
Pt states she started having SOB and cough 2 weeks ago and worse this week. Dx with bronchitis but states she feels worse. Alert and oriented.

## 2014-11-24 MED ORDER — HYDROCOD POLST-CHLORPHEN POLST 10-8 MG/5ML PO LQCR: 5.0000 mL | Freq: Two times a day (BID) | ORAL | Status: DC

## 2014-11-24 MED ORDER — IPRATROPIUM BROMIDE 0.02 % IN SOLN
0.5000 mg | Freq: Once | RESPIRATORY_TRACT | Status: AC
  Administered 2014-11-24: 0.5 mg via RESPIRATORY_TRACT
  Filled 2014-11-24: qty 2.5

## 2014-11-24 MED ORDER — ALBUTEROL SULFATE (2.5 MG/3ML) 0.083% IN NEBU
5.0000 mg | INHALATION_SOLUTION | Freq: Once | RESPIRATORY_TRACT | Status: AC
  Administered 2014-11-24: 5 mg via RESPIRATORY_TRACT
  Filled 2014-11-24: qty 6

## 2014-11-24 MED ORDER — HYDROCOD POLST-CHLORPHEN POLST 10-8 MG/5ML PO LQCR
5.0000 mL | Freq: Once | ORAL | Status: AC
Start: 2014-11-24 — End: 2014-11-24
  Administered 2014-11-24: 5 mL via ORAL
  Filled 2014-11-24: qty 5

## 2014-11-24 MED ORDER — PREDNISONE 10 MG PO TABS: ORAL_TABLET | ORAL | Status: DC

## 2014-11-24 NOTE — Discharge Instructions (Signed)
Cough, Adult   A cough is a reflex. It helps you clear your throat and airways. A cough can help heal your body. A cough can last 2 or 3 weeks (acute) or may last more than 8 weeks (chronic). Some common causes of a cough can include an infection, allergy, or a cold.  HOME CARE  · Only take medicine as told by your doctor.  · If given, take your medicines (antibiotics) as told. Finish them even if you start to feel better.  · Use a cold steam vaporizer or humidifier in your home. This can help loosen thick spit (secretions).  · Sleep so you are almost sitting up (semi-upright). Use pillows to do this. This helps reduce coughing.  · Rest as needed.  · Stop smoking if you smoke.  GET HELP RIGHT AWAY IF:  · You have yellowish-white fluid (pus) in your thick spit.  · Your cough gets worse.  · Your medicine does not reduce coughing, and you are losing sleep.  · You cough up blood.  · You have trouble breathing.  · Your pain gets worse and medicine does not help.  · You have a fever.  MAKE SURE YOU:   · Understand these instructions.  · Will watch your condition.  · Will get help right away if you are not doing well or get worse.  Document Released: 04/27/2011 Document Revised: 12/29/2013 Document Reviewed: 04/27/2011  [employer]® Patient Information ©2015 [employer]. This information is not intended to replace advice given to you by your health care provider. Make sure you discuss any questions you have with your health care provider.

## 2014-11-24 NOTE — ED Provider Notes (Signed)
CSN: [id number]     Arrival date & time 11/23/14  2129 History   First MD Initiated Contact with Patient 11/24/14 [phone]     Chief Complaint  Patient presents with  . Shortness of Breath     (Consider location/radiation/quality/duration/timing/severity/associated sxs/prior Treatment) Patient is a 64 y.o. female presenting with cough. The history is provided by the patient and the spouse. No language interpreter was used.  Cough Cough characteristics:  Paroxysmal Severity:  Moderate Duration:  3 days Timing:  Constant Relieved by:  Nothing Ineffective treatments:  Beta-agonist inhaler and cough suppressants Associated symptoms: shortness of breath   Associated symptoms: no fever, no myalgias and no rash   Associated symptoms comment:  She returns to the ED with complaint of persistent cough uncontrolled with medications at home. She was treated in the ED 2 days ago, diagnosed with bronchitis and discharged home with albuterol inhaler, Z-pack and Tessalon. She returns with reported worsening cough. No fever.    Past Medical History  Diagnosis Date  . Hyperlipidemia   . Benign neoplasm of stomach   . Dizziness and giddiness   . Gastroparesis   . Esophageal reflux   . CAD (coronary artery disease)     a.  cath 2005: LAD 40%;  b. Myoview 3/12: EF 70%, no ischemia;  c. Myoview 9/13: EF 76%, no ischemia  . HTN (hypertension)   . Palpitations   . Depression   . Aortic stenosis     Echo 8/13: EF 55-60%, Mild LVH, mild AS, mean gradient 12 mmHg,   . CHF (congestive heart failure) 06-13-12    episode after Cancer surgery  . PONV (postoperative nausea and vomiting)   . DM2 (diabetes mellitus, type 2) 06-13-12    NIDDM  . Ovarian cancer 06-13-12    surgery only x2-no recent problems  . Chronic hepatitis C without mention of hepatic coma 06-13-12    "states is active"  . Sleep apnea 06-13-12    does not use cpap, cpap setting of 2 when used cpap   Past Surgical History  Procedure  Laterality Date  . Tubal ligation    . Abdominal hysterectomy    . Oophorectomy      1 ovary removed, then later last ovary removed  . Tonsillectomy  age 87  . Bladder suspension  yrs ago    with posterior repair  . Knee arthroscopy      left  . Cataract extraction Bilateral 03/2013  . Breast biopsy  1972    left  . Abdominal hysterectomy  yrs ago  . Cholecystectomy  5 yrs ago  . Colonoscopy N/A 08/11/2013    Procedure: COLONOSCOPY;  Surgeon: Inda Castle, MD;  Location: [facility];  Service: Endoscopy;  Laterality: N/A;   Family History  Problem Relation Age of Onset  . Brain cancer Father     spine cancer  . Heart disease Mother   . Breast cancer Sister   . Kidney disease Sister   . Diabetes Brother   . Esophageal cancer Brother   . Diabetes Maternal Aunt   . Diabetes Paternal Aunt   . Stomach cancer Paternal Grandfather    History  Substance Use Topics  . Smoking status: Never Smoker   . Smokeless tobacco: Never Used  . Alcohol Use: No     Comment: past hx. , no current use in 11 yrs.   OB History    No data available     Review of Systems  Constitutional: Negative  for fever.  HENT: Positive for congestion.   Respiratory: Positive for cough and shortness of breath.   Gastrointestinal: Negative for vomiting.  Musculoskeletal: Negative for myalgias.  Skin: Negative for rash.  Neurological: Negative for syncope and weakness.      Allergies  Sunflower oil; Iodine; Metoclopramide hcl; and Sulfamethoxazole  Home Medications   Prior to Admission medications   Medication Sig Start Date End Date Taking? Authorizing Provider  ARIPiprazole (ABILIFY) 5 MG tablet Take 5 mg by mouth daily.   Yes Historical Provider, MD  aspirin 325 MG tablet Take 1 tablet (325 mg total) by mouth daily. 11/13/13  Yes Orson Eva, MD  azithromycin (ZITHROMAX) 250 MG tablet Take 1 tablet (250 mg total) by mouth daily. Take first 2 tablets together, then 1 every day until finished.  11/21/14  Yes Antonietta Breach, PA-C  benzonatate (TESSALON) 100 MG capsule Take 1 capsule (100 mg total) by mouth every 8 (eight) hours. 11/21/14  Yes Antonietta Breach, PA-C  DULoxetine (CYMBALTA) 30 MG capsule Take 30 mg by mouth daily.   Yes Historical Provider, MD  gabapentin (NEURONTIN) 300 MG capsule Take 300 mg by mouth 2 (two) times daily.   Yes Historical Provider, MD  gemfibrozil (LOPID) 600 MG tablet Take 600 mg by mouth 2 (two) times daily before a meal.   Yes Historical Provider, MD  glimepiride (AMARYL) 4 MG tablet Take 4 mg by mouth every morning.  06/21/13  Yes Historical Provider, MD  HYDROcodone-acetaminophen (NORCO) 7.5-325 MG per tablet Take 1 tablet by mouth 2 (two) times daily.  07/08/13  Yes Historical Provider, MD  HYDROXYZINE PAMOATE PO Take 25 mg by mouth daily.   Yes Historical Provider, MD  lisinopril (PRINIVIL,ZESTRIL) 20 MG tablet Take 20 mg by mouth daily.   Yes Historical Provider, MD  ondansetron (ZOFRAN) 4 MG tablet TAKE 1 TABLET BY MOUTH EVERY 8 HOURS AS NEEDED FOR FOR NAUSEA AND VOMITING 06/08/14  Yes Inda Castle, MD  pantoprazole (PROTONIX) 40 MG tablet TAKE ONE TABLET BY MOUTH TWICE DAILY BEFORE  BREAKFAST  AND  DINNER 11/16/14  Yes Inda Castle, MD  pindolol (VISKEN) 5 MG tablet Take 5 mg by mouth 2 (two) times daily.   Yes Historical Provider, MD  RESTASIS 0.05 % ophthalmic emulsion Place 1 drop into both eyes 2 (two) times daily.  05/20/13  Yes Historical Provider, MD  solifenacin (VESICARE) 5 MG tablet Take 5 mg by mouth daily.    Yes Historical Provider, MD  traMADol (ULTRAM) 50 MG tablet Take 50 mg by mouth every 6 (six) hours as needed for moderate pain.  07/08/13  Yes Historical Provider, MD  traZODone (DESYREL) 50 MG tablet Take 50 mg by mouth at bedtime as needed for sleep.  05/07/14  Yes Historical Provider, MD  fenofibrate (TRICOR) 145 MG tablet Take 145 mg by mouth daily. 11/02/13   Historical Provider, MD  OXYGEN-HELIUM IN Inhale 2 L into the lungs daily.     Historical Provider, MD  PARoxetine (PAXIL-CR) 25 MG 24 hr tablet Take 50 mg by mouth at bedtime.     Historical Provider, MD   BP 162/65 mmHg  Pulse 93  Temp(Src) 98.3 F (36.8 C) (Oral)  Resp 25  SpO2 97% Physical Exam  Constitutional: She is oriented to person, place, and time. She appears well-developed and well-nourished.  HENT:  Head: Normocephalic.  Neck: Normal range of motion. Neck supple.  Cardiovascular: Normal rate and regular rhythm.   Pulmonary/Chest: Effort normal and breath  sounds normal. She has no wheezes. She has no rales.  Actively coughing.   Abdominal: Soft. Bowel sounds are normal. There is no tenderness. There is no rebound and no guarding.  Musculoskeletal: Normal range of motion.  Neurological: She is alert and oriented to person, place, and time.  Skin: Skin is warm and dry. No rash noted.  Psychiatric: She has a normal mood and affect.    ED Course  Procedures (including critical care time) Labs Review Labs Reviewed  BASIC METABOLIC PANEL - Abnormal; Notable for the following:    Glucose, Bld 288 (*)    GFR calc non Af Amer 87 (*)    All other components within normal limits  CBC    Imaging Review No results found.   EKG Interpretation None      MDM   Final diagnoses:  None    1. Cough  She is coughing less after nebulizer and Tussionex. Discussed discharge home with steroids, Tussionex and close PCP follow up in office. Both patient and husband are comfortable with discharge. No unaddressed concerns. VSS.    Charlann Lange, PA-C 11/24/14 [place], MD 11/24/14 [phone]

## 2014-11-25 ENCOUNTER — Emergency Department (HOSPITAL_COMMUNITY): Payer: Medicare Other

## 2014-11-25 ENCOUNTER — Encounter (HOSPITAL_COMMUNITY): Payer: Self-pay | Admitting: Emergency Medicine

## 2014-11-25 ENCOUNTER — Emergency Department (HOSPITAL_COMMUNITY)
Admission: EM | Admit: 2014-11-25 | Discharge: 2014-11-25 | Disposition: A | Discharge status: Home / Self Care | Payer: Medicare Other | Attending: Emergency Medicine | Admitting: Emergency Medicine

## 2014-11-25 DIAGNOSIS — Z8619 Personal history of other infectious and parasitic diseases: Secondary | ICD-10-CM | POA: Diagnosis not present

## 2014-11-25 DIAGNOSIS — E785 Hyperlipidemia, unspecified: Secondary | ICD-10-CM | POA: Insufficient documentation

## 2014-11-25 DIAGNOSIS — Z7952 Long term (current) use of systemic steroids: Secondary | ICD-10-CM | POA: Insufficient documentation

## 2014-11-25 DIAGNOSIS — E119 Type 2 diabetes mellitus without complications: Secondary | ICD-10-CM | POA: Diagnosis not present

## 2014-11-25 DIAGNOSIS — F329 Major depressive disorder, single episode, unspecified: Secondary | ICD-10-CM | POA: Diagnosis not present

## 2014-11-25 DIAGNOSIS — Z8669 Personal history of other diseases of the nervous system and sense organs: Secondary | ICD-10-CM | POA: Diagnosis not present

## 2014-11-25 DIAGNOSIS — I2510 Atherosclerotic heart disease of native coronary artery without angina pectoris: Secondary | ICD-10-CM | POA: Diagnosis not present

## 2014-11-25 DIAGNOSIS — I509 Heart failure, unspecified: Secondary | ICD-10-CM | POA: Diagnosis not present

## 2014-11-25 DIAGNOSIS — I10 Essential (primary) hypertension: Secondary | ICD-10-CM | POA: Insufficient documentation

## 2014-11-25 DIAGNOSIS — Z8543 Personal history of malignant neoplasm of ovary: Secondary | ICD-10-CM | POA: Insufficient documentation

## 2014-11-25 DIAGNOSIS — Z79899 Other long term (current) drug therapy: Secondary | ICD-10-CM | POA: Insufficient documentation

## 2014-11-25 DIAGNOSIS — Z792 Long term (current) use of antibiotics: Secondary | ICD-10-CM | POA: Insufficient documentation

## 2014-11-25 DIAGNOSIS — J441 Chronic obstructive pulmonary disease with (acute) exacerbation: Secondary | ICD-10-CM | POA: Insufficient documentation

## 2014-11-25 DIAGNOSIS — R51 Headache: Secondary | ICD-10-CM | POA: Diagnosis present

## 2014-11-25 LAB — CBC WITH DIFFERENTIAL/PLATELET
Basophils Absolute: 0 10*3/uL (ref 0.0–0.1)
Basophils Relative: 1 % (ref 0–1)
EOS PCT: 4 % (ref 0–5)
Eosinophils Absolute: 0.2 10*3/uL (ref 0.0–0.7)
HCT: 39.3 % (ref 36.0–46.0)
Hemoglobin: 12.8 g/dL (ref 12.0–15.0)
LYMPHS ABS: 1.4 10*3/uL (ref 0.7–4.0)
Lymphocytes Relative: 24 % (ref 12–46)
MCH: 29.4 pg (ref 26.0–34.0)
MCHC: 32.6 g/dL (ref 30.0–36.0)
MCV: 90.1 fL (ref 78.0–100.0)
MONOS PCT: 6 % (ref 3–12)
Monocytes Absolute: 0.3 10*3/uL (ref 0.1–1.0)
NEUTROS PCT: 65 % (ref 43–77)
Neutro Abs: 3.9 10*3/uL (ref 1.7–7.7)
PLATELETS: 214 10*3/uL (ref 150–400)
RBC: 4.36 MIL/uL (ref 3.87–5.11)
RDW: 12.8 % (ref 11.5–15.5)
WBC: 5.9 10*3/uL (ref 4.0–10.5)

## 2014-11-25 LAB — BASIC METABOLIC PANEL
ANION GAP: 12 (ref 5–15)
BUN: 13 mg/dL (ref 6–23)
CO2: 25 mmol/L (ref 19–32)
CREATININE: 0.8 mg/dL (ref 0.50–1.10)
Calcium: 9.6 mg/dL (ref 8.4–10.5)
Chloride: 105 mmol/L (ref 96–112)
GFR calc non Af Amer: 77 mL/min — ABNORMAL LOW (ref >90)
GFR, EST AFRICAN AMERICAN: 90 mL/min — AB (ref >90)
Glucose, Bld: 339 mg/dL — ABNORMAL HIGH (ref 70–99)
Potassium: 3.5 mmol/L (ref 3.5–5.1)
SODIUM: 142 mmol/L (ref 135–145)

## 2014-11-25 LAB — BRAIN NATRIURETIC PEPTIDE: B Natriuretic Peptide: 35.8 pg/mL (ref 0.0–100.0)

## 2014-11-25 LAB — I-STAT TROPONIN, ED: Troponin i, poc: 0 ng/mL (ref 0.00–0.08)

## 2014-11-25 MED ORDER — IPRATROPIUM-ALBUTEROL 0.5-2.5 (3) MG/3ML IN SOLN
3.0000 mL | RESPIRATORY_TRACT | Status: DC
  Administered 2014-11-25: 3 mL via RESPIRATORY_TRACT
  Filled 2014-11-25 (×2): qty 3

## 2014-11-25 MED ORDER — PREDNISONE 20 MG PO TABS: 60.0000 mg | ORAL_TABLET | Freq: Every day | ORAL | Status: DC

## 2014-11-25 MED ORDER — IPRATROPIUM-ALBUTEROL 0.5-2.5 (3) MG/3ML IN SOLN: 3.0000 mL | Freq: Once | RESPIRATORY_TRACT | Status: DC

## 2014-11-25 NOTE — ED Provider Notes (Signed)
CSN: [id number]     Arrival date & time 11/25/14  1228 History   First MD Initiated Contact with Patient 11/25/14 1322     Chief Complaint  Patient presents with  . Back Pain  . Headache     (Consider location/radiation/quality/duration/timing/severity/associated sxs/prior Treatment) Patient is a 64 y.o. female presenting with cough.  Cough Cough characteristics:  Productive Sputum characteristics:  Green Severity:  Moderate Onset quality:  Gradual Duration:  5 days Timing:  Constant Progression:  Worsening Chronicity:  Recurrent Smoker: no   Context: upper respiratory infection   Relieved by:  Nothing Worsened by:  Deep breathing Ineffective treatments:  None tried Associated symptoms: chest pain (pleurisy with coughing), rhinorrhea, shortness of breath and sinus congestion   Associated symptoms: no chills and no fever     Past Medical History  Diagnosis Date  . Hyperlipidemia   . Benign neoplasm of stomach   . Dizziness and giddiness   . Gastroparesis   . Esophageal reflux   . CAD (coronary artery disease)     a.  cath 2005: LAD 40%;  b. Myoview 3/12: EF 70%, no ischemia;  c. Myoview 9/13: EF 76%, no ischemia  . HTN (hypertension)   . Palpitations   . Depression   . Aortic stenosis     Echo 8/13: EF 55-60%, Mild LVH, mild AS, mean gradient 12 mmHg,   . CHF (congestive heart failure) 06-13-12    episode after Cancer surgery  . PONV (postoperative nausea and vomiting)   . DM2 (diabetes mellitus, type 2) 06-13-12    NIDDM  . Ovarian cancer 06-13-12    surgery only x2-no recent problems  . Chronic hepatitis C without mention of hepatic coma 06-13-12    "states is active"  . Sleep apnea 06-13-12    does not use cpap, cpap setting of 2 when used cpap   Past Surgical History  Procedure Laterality Date  . Tubal ligation    . Abdominal hysterectomy    . Oophorectomy      1 ovary removed, then later last ovary removed  . Tonsillectomy  age 44  . Bladder  suspension  yrs ago    with posterior repair  . Knee arthroscopy      left  . Cataract extraction Bilateral 03/2013  . Breast biopsy  1972    left  . Abdominal hysterectomy  yrs ago  . Cholecystectomy  5 yrs ago  . Colonoscopy N/A 08/11/2013    Procedure: COLONOSCOPY;  Surgeon: Inda Castle, MD;  Location: [facility];  Service: Endoscopy;  Laterality: N/A;   Family History  Problem Relation Age of Onset  . Brain cancer Father     spine cancer  . Heart disease Mother   . Breast cancer Sister   . Kidney disease Sister   . Diabetes Brother   . Esophageal cancer Brother   . Diabetes Maternal Aunt   . Diabetes Paternal Aunt   . Stomach cancer Paternal Grandfather    History  Substance Use Topics  . Smoking status: Never Smoker   . Smokeless tobacco: Never Used  . Alcohol Use: No     Comment: past hx. , no current use in 11 yrs.   OB History    No data available     Review of Systems  Constitutional: Negative for fever and chills.  HENT: Positive for rhinorrhea.   Respiratory: Positive for cough and shortness of breath.   Cardiovascular: Positive for chest pain (pleurisy with coughing).  All other systems reviewed and are negative.     Allergies  Sunflower oil; Iodine; Metoclopramide hcl; and Sulfamethoxazole  Home Medications   Prior to Admission medications   Medication Sig Start Date End Date Taking? Authorizing Provider  ARIPiprazole (ABILIFY) 5 MG tablet Take 5 mg by mouth daily.   Yes Historical Provider, MD  aspirin 325 MG tablet Take 1 tablet (325 mg total) by mouth daily. 11/13/13  Yes Orson Eva, MD  azithromycin (ZITHROMAX) 250 MG tablet Take 1 tablet (250 mg total) by mouth daily. Take first 2 tablets together, then 1 every day until finished. 11/21/14  Yes Antonietta Breach, PA-C  benzonatate (TESSALON) 100 MG capsule Take 1 capsule (100 mg total) by mouth every 8 (eight) hours. 11/21/14  Yes Antonietta Breach, PA-C  chlorpheniramine-HYDROcodone (TUSSIONEX  PENNKINETIC ER) 10-8 MG/5ML LQCR Take 5 mLs by mouth 2 (two) times daily. 11/24/14  Yes Shari Upstill, PA-C  DULoxetine (CYMBALTA) 30 MG capsule Take 30 mg by mouth daily.   Yes Historical Provider, MD  gabapentin (NEURONTIN) 300 MG capsule Take 300 mg by mouth 2 (two) times daily.   Yes Historical Provider, MD  gemfibrozil (LOPID) 600 MG tablet Take 600 mg by mouth 2 (two) times daily before a meal.   Yes Historical Provider, MD  glimepiride (AMARYL) 4 MG tablet Take 4 mg by mouth every morning.  06/21/13  Yes Historical Provider, MD  HYDROcodone-acetaminophen (NORCO) 7.5-325 MG per tablet Take 1 tablet by mouth 2 (two) times daily.  07/08/13  Yes Historical Provider, MD  HYDROXYZINE PAMOATE PO Take 25 mg by mouth daily.   Yes Historical Provider, MD  lisinopril (PRINIVIL,ZESTRIL) 20 MG tablet Take 20 mg by mouth daily.   Yes Historical Provider, MD  OXYGEN-HELIUM IN 2-3 L by Other route continuous.    Yes Historical Provider, MD  pantoprazole (PROTONIX) 40 MG tablet TAKE ONE TABLET BY MOUTH TWICE DAILY BEFORE  BREAKFAST  AND  DINNER 11/16/14  Yes Inda Castle, MD  pindolol (VISKEN) 5 MG tablet Take 5 mg by mouth 2 (two) times daily.   Yes Historical Provider, MD  RESTASIS 0.05 % ophthalmic emulsion Place 1 drop into both eyes 2 (two) times daily.  05/20/13  Yes Historical Provider, MD  solifenacin (VESICARE) 5 MG tablet Take 5 mg by mouth daily.    Yes Historical Provider, MD  traMADol (ULTRAM) 50 MG tablet Take 50 mg by mouth every 6 (six) hours as needed for moderate pain.  07/08/13  Yes Historical Provider, MD  traZODone (DESYREL) 50 MG tablet Take 50 mg by mouth at bedtime as needed for sleep.  05/07/14  Yes Historical Provider, MD  ondansetron (ZOFRAN) 4 MG tablet TAKE 1 TABLET BY MOUTH EVERY 8 HOURS AS NEEDED FOR FOR NAUSEA AND VOMITING Patient not taking: Reported on 11/25/2014 06/08/14   Inda Castle, MD  predniSONE (DELTASONE) 20 MG tablet Take 3 tablets (60 mg total) by mouth daily.  11/25/14   Debby Freiberg, MD   BP 141/80 mmHg  Pulse 95  Temp(Src) 98.3 F (36.8 C) (Oral)  Resp 16  SpO2 99% Physical Exam  Constitutional: She is oriented to person, place, and time. She appears well-developed and well-nourished.  HENT:  Head: Normocephalic and atraumatic.  Right Ear: External ear normal.  Left Ear: External ear normal.  Eyes: Conjunctivae and EOM are normal. Pupils are equal, round, and reactive to light.  Neck: Normal range of motion. Neck supple.  Cardiovascular: Normal rate, regular rhythm, normal heart sounds  and intact distal pulses.   Pulmonary/Chest: Effort normal and breath sounds normal.  Abdominal: Soft. Bowel sounds are normal. There is no tenderness.  Musculoskeletal: Normal range of motion.  Neurological: She is alert and oriented to person, place, and time.  Skin: Skin is warm and dry.  Vitals reviewed.   ED Course  Procedures (including critical care time) Labs Review Labs Reviewed  BASIC METABOLIC PANEL - Abnormal; Notable for the following:    Glucose, Bld 339 (*)    GFR calc non Af Amer 77 (*)    GFR calc Af Amer 90 (*)    All other components within normal limits  CBC WITH DIFFERENTIAL/PLATELET  BRAIN NATRIURETIC PEPTIDE  I-STAT TROPOININ, ED    Imaging Review Dg Chest 2 View  11/25/2014   CLINICAL DATA:  64 year old female with 4- 5 day history of mid back pain and headache exacerbated by productive cough.  EXAM: CHEST  2 VIEW  COMPARISON:  Prior chest x-ray 11/20/2014  FINDINGS: Slightly lower inspiratory volumes. Otherwise, the lungs remain clear without evidence of focal airspace consolidation, pulmonary edema, pleural effusion or pneumothorax. Stable cardiac and mediastinal contours. No acute osseous abnormality.  IMPRESSION: No active cardiopulmonary disease.   Electronically Signed   By: Jacqulynn Cadet M.D.   On: 11/25/2014 15:23     EKG Interpretation None      MDM   Final diagnoses:  COPD exacerbation    64 y.o.  female with pertinent PMH of COPD, recent cough x 5 days with visit x2, sp azithromycin, prednisone presents with continued cough.  She denies change or worsening, and did not fill cough syrup or prednisone prescriptions.  Physical exam today as above with wheezing bilaterally.  Given duoneb with resolution.  Discussed that a COPD exacerbation would not necessarily remit immediately and that steroids would probably help her improve.  I informed her that these were on the $4 [facility] list, and also hand wrote this on part of the prescription.  DC home in stable condition.    I have reviewed all laboratory and imaging studies if ordered as above  1. COPD exacerbation         Debby Freiberg, MD 11/26/14 [phone]

## 2014-11-25 NOTE — ED Notes (Addendum)
Pt c/o mid back pain x 4-5 days as well as headache.  Pt states that when she coughs the headache gets worse.  Pt states that phlegm changed from white to green.  Pt states that she has been taking antibiotic and pill for coughing.

## 2014-11-25 NOTE — Discharge Instructions (Signed)

## 2014-12-03 ENCOUNTER — Other Ambulatory Visit: Payer: Self-pay | Admitting: Gastroenterology

## 2014-12-08 ENCOUNTER — Telehealth: Payer: Self-pay | Admitting: Internal Medicine

## 2014-12-08 MED ORDER — BISOPROLOL FUMARATE 5 MG PO TABS: 5.0000 mg | ORAL_TABLET | Freq: Every day | ORAL | Status: DC

## 2014-12-08 NOTE — Telephone Encounter (Signed)
Pt requesting a substitute for pindolol since it is on back order and she cannot get it right now. Pt states she took her last pindolol last night. I did not see pindolol on her medication list from 07/05/14 office note with Dr Harrington Challenger but pt states she was taking it then. Pindolol is on medication list from recent ED visits.  I reviewed with Patterson Hammersmith recommended pt take bisoprolol 5mg  daily.

## 2014-12-08 NOTE — Telephone Encounter (Signed)
Pt states she is recovering from a COPD exacerbation. Pt advised to call if she has an increase in wheezing, cough or other respiratory symptoms once she starts bisoprolol.

## 2014-12-08 NOTE — Telephone Encounter (Signed)
Pt c/o medication issue:  1. Name of Medication: Tindolol  2. How are you currently taking this medication (dosage and times per day)? 2x per day 5mg    3. Are you having a reaction (difficulty breathing--STAT)? No  4. What is your medication issue? Pt calling stating that she needs to be prescribed something different because this medication has been on back order and she hasn't been able to get it filled. Pt also wants Dr. Harrington Challenger to be notified that she has been diagnosed with COPD.   Pt states that if her son answers the phone to just leave a message with him for her to call back.

## 2014-12-20 ENCOUNTER — Other Ambulatory Visit: Payer: Self-pay | Admitting: Gastroenterology

## 2015-01-04 ENCOUNTER — Ambulatory Visit (INDEPENDENT_AMBULATORY_CARE_PROVIDER_SITE_OTHER): Payer: Medicare Other | Admitting: Internal Medicine

## 2015-01-04 ENCOUNTER — Telehealth: Payer: Self-pay | Admitting: Internal Medicine

## 2015-01-04 ENCOUNTER — Encounter: Payer: Self-pay | Admitting: Internal Medicine

## 2015-01-04 VITALS — BP 140/86 | HR 91 | Ht 66.0 in | Wt 184.1 lb

## 2015-01-04 DIAGNOSIS — I10 Essential (primary) hypertension: Secondary | ICD-10-CM | POA: Diagnosis not present

## 2015-01-04 DIAGNOSIS — R0600 Dyspnea, unspecified: Secondary | ICD-10-CM | POA: Diagnosis not present

## 2015-01-04 DIAGNOSIS — E785 Hyperlipidemia, unspecified: Secondary | ICD-10-CM | POA: Diagnosis not present

## 2015-01-04 NOTE — Telephone Encounter (Signed)
Spoke with pt and her youngest son TOld son I did not think patient should have to help with chores (Taking care of 63 yo granddaughter, walking dogs) Too much  She is on O2 around the clock Younger son agrees but says older brother is not realistic  Older brother owns home

## 2015-01-04 NOTE — Patient Instructions (Signed)
Your physician recommends that you continue on your current medications as directed. Please refer to the Current Medication list given to you today. Your physician recommends that you schedule a follow-up appointment in: September, 2016 with Dr. Harrington Challenger.

## 2015-01-04 NOTE — Progress Notes (Signed)
Cardiology Office Note   Date:  12/01/6597   ID:  [name] [name], DOB [birth year], MRN [id number]  PCP:  Vicenta Aly, FNP  Cardiologist:   Dorris Carnes, MD   No chief complaint on file.     History of Present Illness: [name] [name] is a 64 y.o. female with a history ofminimal CAD by cath in 2005 (LAD 40%), DM2, HL, GERD, HTN, chronic HCV, carotid stenosis, chest pain and palpitations. Myoview 3/12: EF 70%, no ischemia. Carotid Dopplers 10/10: RICA 40-59%. She has required esophageal dilatation in the past. She is followed at the [facility] for depression. Echo demonstrated normal LVF, mild AS, mild LVH She had knee surgery last year Post procedure developed a PE  Since I saw her she has been evaluated in pulmonary. She is now on oxygen for restrictive lung disease.  I saw her in November   She denies CP Breathing hsa been stable on oxygen.      Current Outpatient Prescriptions  Medication Sig Dispense Refill  . ARIPiprazole (ABILIFY) 5 MG tablet Take 5 mg by mouth daily.    [name] aspirin 325 MG tablet Take 1 tablet (325 mg total) by mouth daily.    . bisoprolol (ZEBETA) 5 MG tablet Take 1 tablet (5 mg total) by mouth daily. 30 tablet 4  . chlorpheniramine-HYDROcodone (TUSSIONEX PENNKINETIC ER) 10-8 MG/5ML LQCR Take 5 mLs by mouth 2 (two) times daily. 70 mL 0  . DULoxetine (CYMBALTA) 30 MG capsule Take 30 mg by mouth daily.    [name] gabapentin (NEURONTIN) 300 MG capsule Take 300 mg by mouth 2 (two) times daily.    [name] gemfibrozil (LOPID) 600 MG tablet Take 600 mg by mouth 2 (two) times daily before a meal.    . glimepiride (AMARYL) 4 MG tablet Take 4 mg by mouth every morning.     [name] HYDROcodone-acetaminophen (NORCO) 7.5-325 MG per tablet Take 1 tablet by mouth 2 (two) times daily.     [name] HYDROXYZINE PAMOATE PO Take 25 mg by mouth daily.    [name] lisinopril (PRINIVIL,ZESTRIL) 20 MG tablet Take 20 mg by mouth daily.    . ondansetron (ZOFRAN) 4 MG tablet TAKE 1 TABLET BY MOUTH EVERY  8 HOURS AS NEEDED FOR FOR NAUSEA AND VOMITING 15 tablet 0  . OXYGEN-HELIUM IN 2-3 L by Other route continuous.     . pantoprazole (PROTONIX) 40 MG tablet TAKE ONE TABLET BY MOUTH TWICE DAILY BEFORE BREAKFAST AND  DINNER 30 tablet 0  . RESTASIS 0.05 % ophthalmic emulsion Place 1 drop into both eyes 2 (two) times daily.     . solifenacin (VESICARE) 5 MG tablet Take 5 mg by mouth daily.     . traMADol (ULTRAM) 50 MG tablet Take 50 mg by mouth every 6 (six) hours as needed for moderate pain.     . traZODone (DESYREL) 50 MG tablet Take 50 mg by mouth at bedtime as needed for sleep.      No current facility-administered medications for this visit.    Allergies:   Sunflower oil; Iodine; Metoclopramide hcl; and Sulfamethoxazole   Past Medical History  Diagnosis Date  . Hyperlipidemia   . Benign neoplasm of stomach   . Dizziness and giddiness   . Gastroparesis   . Esophageal reflux   . CAD (coronary artery disease)     a.  cath 2005: LAD 40%;  b. Myoview 3/12: EF 70%, no ischemia;  c. Myoview 9/13: EF 76%, no ischemia  . HTN (hypertension)   .  Palpitations   . Depression   . Aortic stenosis     Echo 8/13: EF 55-60%, Mild LVH, mild AS, mean gradient 12 mmHg,   . CHF (congestive heart failure) 06-13-12    episode after Cancer surgery  . PONV (postoperative nausea and vomiting)   . DM2 (diabetes mellitus, type 2) 06-13-12    NIDDM  . Ovarian cancer 06-13-12    surgery only x2-no recent problems  . Chronic hepatitis C without mention of hepatic coma 06-13-12    "states is active"  . Sleep apnea 06-13-12    does not use cpap, cpap setting of 2 when used cpap    Past Surgical History  Procedure Laterality Date  . Tubal ligation    . Abdominal hysterectomy    . Oophorectomy      1 ovary removed, then later last ovary removed  . Tonsillectomy  age 76  . Bladder suspension  yrs ago    with posterior repair  . Knee arthroscopy      left  . Cataract extraction Bilateral 03/2013  .  Breast biopsy  1972    left  . Abdominal hysterectomy  yrs ago  . Cholecystectomy  5 yrs ago  . Colonoscopy N/A 08/11/2013    Procedure: COLONOSCOPY;  Surgeon: Inda Castle, MD;  Location: [facility];  Service: Endoscopy;  Laterality: N/A;     Social History:  The patient  reports that she has never smoked. She has never used smokeless tobacco. She reports that she uses illicit drugs ("Crack" cocaine). She reports that she does not drink alcohol.   Family History:  The patient's family history includes Brain cancer in her father; Breast cancer in her sister; Diabetes in her brother, maternal aunt, and paternal aunt; Esophageal cancer in her brother; Heart disease in her mother; Kidney disease in her sister; Stomach cancer in her paternal grandfather.    ROS:  Please see the history of present illness. All other systems are reviewed and  Negative to the above problem except as noted.    PHYSICAL EXAM: VS:  BP 140/86 mmHg  Pulse 91  Ht 5\' 6"  (1.676 m)  Wt 184 lb 1.9 oz (83.516 kg)  BMI 29.73 kg/m2  GEN: Well nourished, well developed, in no acute distress HEENT: normal Neck: no JVD, carotid bruits, or masses Cardiac: RRR; no murmurs, rubs, or gallops,tr edema  Respiratory:  clear to auscultation bilaterally, normal work of breathing GI: soft, nontender, nondistended, + BS  No hepatomegaly  MS: no deformity Moving all extremities   Skin: warm and dry  Small macules on arms and legs   Neuro:  Strength and sensation are intact Psych: euthymic mood, full affect   EKG:  EKG is not ordered today.   Lipid Panel    Component Value Date/Time   CHOL 136 11/12/2013 0505   TRIG 374* 11/12/2013 0505   HDL 19* 11/12/2013 0505   CHOLHDL 7.2 11/12/2013 0505   VLDL 75* 11/12/2013 0505   LDLCALC 42 11/12/2013 0505   LDLDIRECT 63.6 07/29/2009 0945      Wt Readings from Last 3 Encounters:  01/04/15 184 lb 1.9 oz (83.516 kg)  07/06/14 189 lb (85.73 kg)  06/04/14 186 lb (84.369 kg)       ASSESSMENT AND PLAN:  1  Dyspnea  patinet thinks it is worse  Has appointment with R Byrum next wk  Keep  Fluid may be up a litte I would not change anything  2.  CAD  I am not convinced of active angina    3.  HTn  Adequate control    4.  Pulm  On oxygen 24 hours per day  Keep appt with R Byrum  Patient is tearful today  Says she has rash that she is seeing her doctor for  ? If bed bug bites  Says there have been bed bugs in house Lives in home with son and family  50 people total  She is expected to help care for 69 yr old granddaughter who is mentally disabled  Has hard time  Not on oxygen when she does some things    Signed, Dorris Carnes, MD  01/04/2015 11:29 [address], [place],   [address] Phone: [phone]; Fax: [phone]

## 2015-01-05 LAB — CBC AND DIFFERENTIAL
HEMATOCRIT: 37 % (ref 36–46)
HEMOGLOBIN: 11.9 g/dL — AB (ref 12.0–16.0)
Platelets: 254 10*3/uL (ref 150–399)
WBC: 5.5 10^3/mL

## 2015-01-05 LAB — BASIC METABOLIC PANEL
BUN: 11 mg/dL (ref 4–21)
Creatinine: 0.7 mg/dL (ref <=1.1)
GLUCOSE: 392 mg/dL
Potassium: 4 mmol/L (ref 3.4–5.3)
SODIUM: 135 mmol/L — AB (ref 137–147)

## 2015-01-05 LAB — HEPATIC FUNCTION PANEL
ALK PHOS: 85 U/L (ref 25–125)
ALT: 36 U/L — AB (ref 7–35)
AST: 62 U/L — AB (ref 13–35)
BILIRUBIN, TOTAL: 0.3 mg/dL

## 2015-01-05 LAB — HEMOGLOBIN A1C: Hemoglobin A1C: 10.6

## 2015-01-11 ENCOUNTER — Ambulatory Visit (INDEPENDENT_AMBULATORY_CARE_PROVIDER_SITE_OTHER): Payer: Medicare Other | Admitting: Emergency Medicine

## 2015-01-11 ENCOUNTER — Encounter: Payer: Self-pay | Admitting: Emergency Medicine

## 2015-01-11 VITALS — BP 110/82 | HR 87 | Ht 66.0 in | Wt 181.0 lb

## 2015-01-11 DIAGNOSIS — R197 Diarrhea, unspecified: Secondary | ICD-10-CM | POA: Diagnosis not present

## 2015-01-11 DIAGNOSIS — G4733 Obstructive sleep apnea (adult) (pediatric): Secondary | ICD-10-CM

## 2015-01-11 DIAGNOSIS — B349 Viral infection, unspecified: Secondary | ICD-10-CM

## 2015-01-11 NOTE — Assessment & Plan Note (Signed)
Her nausea, diarrhea, cough and papular rash, HA are all consistent with a viral syndrome. Did have an exposure to a child who lives in her home with a similar syndrome and I suspect that this was hand-foot-and-mouth disease. She denies tick bite. Recommended supportive care with fluids, rest, Tylenol as needed. If her symptoms continue then I have asked her to discuss with her primary doctor. I do not believe that this is a tickborne illness.

## 2015-01-11 NOTE — Assessment & Plan Note (Signed)
Currently and treated. We discussed the barriers to using CPAP. She is not interested in going back to the sleep lab or try to get a different mask at this time. I asked her to consider it and we will revisit it at a future office visit

## 2015-01-11 NOTE — Assessment & Plan Note (Signed)
She has chronic diarrhea and has been followed by Dr. Deatra Ina with GI. This episode appears to be in acute exacerbation on top of her usual symptoms. I suspect related to a viral syndrome.

## 2015-01-11 NOTE — Progress Notes (Signed)
Subjective:    Patient ID: [name] [name], female    DOB: 01-07-1951, 64 y.o.   MRN: [id number]  HPI 64 yo woman, never smoker, mild CAD, mild AS, Hep C, HTN, small L PE in 05/2012 completed therapy, OSA not reliable on CPAP, DM, ovarian CA. Followed by Dr Harrington Challenger at [facility] and referred for dyspnea and hypoxemia. Of note was seen in ED 04/10/13 for chest pressure and dyspnea > V/q low prob. She has been having dyspnea and difficulty taking a deep breath for about 2 weeks. Never had this problem before. Bothers her when eating, when she wakes up in am while supine - better when she gets up. She describes recent falls - about 3 times in last several months. The last time was 3 weeks ago. No clear trauma to ribs, although she did hit her back and head. Just moved into a camper, not using CPAP for 9-10 months. She has been noted to have desats in the ED and at Cardiology. No wheeze, occas dry cough (on lisinopril). She has albuterol - may help her some.   ROV 05/15/13 -- follows up for dyspnea and hypoxemia. She returns for f/u. She has not restarted CPAP, although she did wear it for one night to get her ONO. We performed ONO on CPAP that showed desat < 88% for 20% of the night. PFT performed > show restriction and decreased DLCO. TTE from 2013 showed normal RV size and fxn.     ROV 09/19/13 - Hx dyspnea and hypoxemia.  She has not been able to go back to CPAP, feels the mask fit is poor  ROV 01/22/14 -- hx restrictive disease, OSA on CPAP but , dyspnea, hypoxemia. Also hx PE, DM, ovarian CA, CAD and AS. Last time we tried to restart CPAP with a new mask but she was unable to wear due to leak. Since last time she unfortunately suffered a TIA - she is still dealing with some memory problems. Oxygen compliance is good inside, but she cannot use it when she is walking her dogs because she is worried about getting tangled up. She is having increased PND, dyspnea. Not on allergy meds.   ROV 01/11/15 -- follow-up  visit for history of PE, CAD, AS. She is followed by me for restrictive lung disease and obstructive sleep apnea.   She presents today acutely ill - acute worsening of nausea, vomiting, diarrhea, also a diffuse rash that began about 2 weeks ago. She has cough, minimally productive of white mucous. She has not found a tick or tick bite. She has had HA for about two weeks.  She lives in a home with many kids, 32 people total, one of the kids had a similar illness.                                           She has a CPAP mask, does not use it - says it keeps her awake.   Review of Systems  Constitutional: Negative for fever and unexpected weight change.  HENT: Positive for congestion. Negative for dental problem, ear pain, nosebleeds, postnasal drip, rhinorrhea, sinus pressure, sneezing, sore throat and trouble swallowing.   Eyes: Negative for redness and itching.  Respiratory: Positive for cough. Negative for chest tightness, shortness of breath and wheezing.   Cardiovascular: Negative for palpitations and leg swelling.  Gastrointestinal: Positive for nausea,  vomiting and diarrhea.  Genitourinary: Negative for dysuria.  Musculoskeletal: Negative for joint swelling.  Skin: Positive for rash.  Neurological: Negative for dizziness, light-headedness and headaches.  Hematological: Does not bruise/bleed easily.  Psychiatric/Behavioral: Negative for dysphoric mood. The patient is not nervous/anxious.     06/21/12 --  Comparison: Chest x-ray of 05/24/2012  Findings: On the second contrast injection the pulmonary arteries  are moderately well opacified. The only questionable abnormality  is a tiny filling defect within a sub segmental branch of the  pulmonary artery to the left upper lobe. A very small pulmonary  embolism in this subsegmental branch cannot be excluded. No other  filling defect is seen throughout the pulmonary arterial system.  The thoracic aorta opacifies with no acute abnormality.  The  origins of the great vessels are patent. No mediastinal or hilar  adenopathy is seen. Imaging through the upper abdomen, there is  diffuse fatty infiltration of the liver and the liver appears  enlarged. Surgical clips are present from prior cholecystectomy.  On the lung window images, no lung parenchymal lesion is seen. No  infiltrate is noted. No pleural effusion is noted. Incidental  calcified splenic granuloma are present from prior granulomatous  disease. No bony abnormality is seen.  IMPRESSION:  1. Tiny pulmonary embolus within a subsegmental branch of the left  upper lobe pulmonary artery. No other filling defect is seen.  2. No infiltrate, effusion, or nodule.  3. Diffuse fatty infiltration of the liver. Suspect hepatomegaly  4. Calcified splenic granulomas from prior granulomatous disease.       Objective:   Physical Exam Filed Vitals:   01/11/15 1346  BP: 110/82  Pulse: 87  Height: 5\' 6"  (1.676 m)  Weight: 181 lb (82.101 kg)  SpO2: 98%   Gen: Pleasant, overwt, in no distress, reserved affect  ENT: No lesions,  mouth clear,  oropharynx clear, no postnasal drip  Neck: No JVD, no TMG, no carotid bruits, no stridor  Lungs: No use of accessory muscles, no dullness to percussion, clear without rales or rhonchi  Cardiovascular: RRR, heart sounds normal, no murmur or gallops, no peripheral edema  Musculoskeletal: No deformities, no cyanosis or clubbing  Neuro: alert, non focal  Skin: Warm, no lesions or rashes     Assessment & Plan:  Obstructive sleep apnea Currently and treated. We discussed the barriers to using CPAP. She is not interested in going back to the sleep lab or try to get a different mask at this time. I asked her to consider it and we will revisit it at a future office visit   Diarrhea She has chronic diarrhea and has been followed by Dr. Deatra Ina with GI. This episode appears to be in acute exacerbation on top of her usual symptoms. I suspect  related to a viral syndrome.    Viral syndrome Her nausea, diarrhea, cough and papular rash, HA are all consistent with a viral syndrome. Did have an exposure to a child who lives in her home with a similar syndrome and I suspect that this was hand-foot-and-mouth disease. She denies tick bite. Recommended supportive care with fluids, rest, Tylenol as needed. If her symptoms continue then I have asked her to discuss with her primary doctor. I do not believe that this is a tickborne illness.

## 2015-01-11 NOTE — Patient Instructions (Signed)
Please drink plenty of fluids, get rest. Use Tylenol for headache We will not plan to get a new CPAP mask or perform any re-titration at this time. We will revisit this at your next visit. Please call your primary doctor if your rash and viral symptoms are not improving in the next several days Follow with Dr Lamonte Sakai in 12 months or sooner if you have any problems

## 2015-01-13 ENCOUNTER — Other Ambulatory Visit: Payer: Self-pay | Admitting: Gastroenterology

## 2015-01-26 ENCOUNTER — Telehealth: Payer: Self-pay | Admitting: Gastroenterology

## 2015-01-26 MED ORDER — ONDANSETRON HCL 4 MG PO TABS: 4.0000 mg | ORAL_TABLET | Freq: Three times a day (TID) | ORAL | Status: DC | PRN

## 2015-01-26 NOTE — Telephone Encounter (Signed)
Informed patient that med was sent

## 2015-02-04 ENCOUNTER — Other Ambulatory Visit: Payer: Self-pay | Admitting: Gastroenterology

## 2015-02-24 ENCOUNTER — Telehealth: Payer: Self-pay | Admitting: Gastroenterology

## 2015-02-25 ENCOUNTER — Emergency Department (HOSPITAL_COMMUNITY): Payer: Medicare Other

## 2015-02-25 ENCOUNTER — Encounter (HOSPITAL_COMMUNITY): Payer: Self-pay | Admitting: Emergency Medicine

## 2015-02-25 ENCOUNTER — Emergency Department (HOSPITAL_COMMUNITY)
Admission: EM | Admit: 2015-02-25 | Discharge: 2015-02-26 | Disposition: A | Discharge status: Home / Self Care | Payer: Medicare Other | Attending: Emergency Medicine | Admitting: Emergency Medicine

## 2015-02-25 DIAGNOSIS — Z9851 Tubal ligation status: Secondary | ICD-10-CM | POA: Insufficient documentation

## 2015-02-25 DIAGNOSIS — I509 Heart failure, unspecified: Secondary | ICD-10-CM | POA: Insufficient documentation

## 2015-02-25 DIAGNOSIS — Z8619 Personal history of other infectious and parasitic diseases: Secondary | ICD-10-CM | POA: Insufficient documentation

## 2015-02-25 DIAGNOSIS — Z86018 Personal history of other benign neoplasm: Secondary | ICD-10-CM | POA: Diagnosis not present

## 2015-02-25 DIAGNOSIS — K219 Gastro-esophageal reflux disease without esophagitis: Secondary | ICD-10-CM | POA: Insufficient documentation

## 2015-02-25 DIAGNOSIS — R0602 Shortness of breath: Secondary | ICD-10-CM | POA: Insufficient documentation

## 2015-02-25 DIAGNOSIS — Z79899 Other long term (current) drug therapy: Secondary | ICD-10-CM | POA: Diagnosis not present

## 2015-02-25 DIAGNOSIS — Z7982 Long term (current) use of aspirin: Secondary | ICD-10-CM | POA: Insufficient documentation

## 2015-02-25 DIAGNOSIS — E1165 Type 2 diabetes mellitus with hyperglycemia: Secondary | ICD-10-CM | POA: Diagnosis not present

## 2015-02-25 DIAGNOSIS — Z8543 Personal history of malignant neoplasm of ovary: Secondary | ICD-10-CM | POA: Diagnosis not present

## 2015-02-25 DIAGNOSIS — G8929 Other chronic pain: Secondary | ICD-10-CM | POA: Diagnosis not present

## 2015-02-25 DIAGNOSIS — I10 Essential (primary) hypertension: Secondary | ICD-10-CM | POA: Insufficient documentation

## 2015-02-25 DIAGNOSIS — R110 Nausea: Secondary | ICD-10-CM | POA: Diagnosis not present

## 2015-02-25 DIAGNOSIS — R197 Diarrhea, unspecified: Secondary | ICD-10-CM | POA: Insufficient documentation

## 2015-02-25 DIAGNOSIS — Z8669 Personal history of other diseases of the nervous system and sense organs: Secondary | ICD-10-CM | POA: Diagnosis not present

## 2015-02-25 DIAGNOSIS — R1013 Epigastric pain: Secondary | ICD-10-CM | POA: Diagnosis present

## 2015-02-25 DIAGNOSIS — F329 Major depressive disorder, single episode, unspecified: Secondary | ICD-10-CM | POA: Diagnosis not present

## 2015-02-25 DIAGNOSIS — I2510 Atherosclerotic heart disease of native coronary artery without angina pectoris: Secondary | ICD-10-CM | POA: Insufficient documentation

## 2015-02-25 DIAGNOSIS — R739 Hyperglycemia, unspecified: Secondary | ICD-10-CM

## 2015-02-25 LAB — CBC WITH DIFFERENTIAL/PLATELET
BASOS ABS: 0 10*3/uL (ref 0.0–0.1)
Basophils Relative: 0 % (ref 0–1)
EOS PCT: 0 % (ref 0–5)
Eosinophils Absolute: 0 10*3/uL (ref 0.0–0.7)
HCT: 35.3 % — ABNORMAL LOW (ref 36.0–46.0)
HEMOGLOBIN: 12.1 g/dL (ref 12.0–15.0)
LYMPHS ABS: 0.5 10*3/uL — AB (ref 0.7–4.0)
LYMPHS PCT: 12 % (ref 12–46)
MCH: 29.7 pg (ref 26.0–34.0)
MCHC: 34.3 g/dL (ref 30.0–36.0)
MCV: 86.7 fL (ref 78.0–100.0)
MONOS PCT: 2 % — AB (ref 3–12)
Monocytes Absolute: 0.1 10*3/uL (ref 0.1–1.0)
NEUTROS ABS: 3.9 10*3/uL (ref 1.7–7.7)
Neutrophils Relative %: 86 % — ABNORMAL HIGH (ref 43–77)
Platelets: 241 10*3/uL (ref 150–400)
RBC: 4.07 MIL/uL (ref 3.87–5.11)
RDW: 12.8 % (ref 11.5–15.5)
WBC: 4.5 10*3/uL (ref 4.0–10.5)

## 2015-02-25 LAB — CBG MONITORING, ED: Glucose-Capillary: 451 mg/dL — ABNORMAL HIGH (ref 65–99)

## 2015-02-25 MED ORDER — SODIUM CHLORIDE 0.9 % IV BOLUS (SEPSIS)
1000.0000 mL | Freq: Once | INTRAVENOUS | Status: AC
  Administered 2015-02-26: 1000 mL via INTRAVENOUS

## 2015-02-25 NOTE — Telephone Encounter (Signed)
Patient changed to 03/10/15 with [facility] [place] [facility].

## 2015-02-25 NOTE — ED Notes (Signed)
Pt reports abdominal pain for three weeks, pain before and after eating, not eating well.  Yesterday she had steroid injections for her back pain and today her sugar level was too high for her meter to read.

## 2015-02-25 NOTE — ED Notes (Signed)
Pt st's she started having left upper abd pain approx 2 hours ago.  St's she checked her sugar and it read high,  Pt also c/o iincreased shortness of breath.  Pt is on home 02 at 2LPM

## 2015-02-26 ENCOUNTER — Encounter (HOSPITAL_COMMUNITY): Payer: Self-pay

## 2015-02-26 ENCOUNTER — Emergency Department (HOSPITAL_COMMUNITY): Payer: Medicare Other

## 2015-02-26 LAB — COMPREHENSIVE METABOLIC PANEL
ALBUMIN: 3.6 g/dL (ref 3.5–5.0)
ALT: 37 U/L (ref 14–54)
AST: 69 U/L — AB (ref 15–41)
Alkaline Phosphatase: 66 U/L (ref 38–126)
Anion gap: 12 (ref 5–15)
BILIRUBIN TOTAL: 0.6 mg/dL (ref 0.3–1.2)
BUN: 13 mg/dL (ref 6–20)
CO2: 20 mmol/L — AB (ref 22–32)
Calcium: 9.2 mg/dL (ref 8.9–10.3)
Chloride: 106 mmol/L (ref 101–111)
Creatinine, Ser: 1.01 mg/dL — ABNORMAL HIGH (ref 0.44–1.00)
GFR calc non Af Amer: 58 mL/min — ABNORMAL LOW (ref >60)
Glucose, Bld: 510 mg/dL — ABNORMAL HIGH (ref 65–99)
POTASSIUM: 4.2 mmol/L (ref 3.5–5.1)
Sodium: 138 mmol/L (ref 135–145)
TOTAL PROTEIN: 7.3 g/dL (ref 6.5–8.1)

## 2015-02-26 LAB — CBG MONITORING, ED: Glucose-Capillary: 360 mg/dL — ABNORMAL HIGH (ref 65–99)

## 2015-02-26 MED ORDER — IOHEXOL 300 MG/ML  SOLN
80.0000 mL | Freq: Once | INTRAMUSCULAR | Status: AC | PRN
  Administered 2015-02-26: 80 mL via INTRAVENOUS

## 2015-02-26 MED ORDER — IOHEXOL 300 MG/ML  SOLN
25.0000 mL | Freq: Once | INTRAMUSCULAR | Status: AC | PRN
  Administered 2015-02-26: 25 mL via ORAL

## 2015-02-26 MED ORDER — GI COCKTAIL ~~LOC~~
30.0000 mL | Freq: Once | ORAL | Status: AC
  Administered 2015-02-26: 30 mL via ORAL
  Filled 2015-02-26: qty 30

## 2015-02-26 MED ORDER — PROMETHAZINE HCL 12.5 MG PO TABS: ORAL_TABLET | ORAL | Status: DC

## 2015-02-26 MED ORDER — ONDANSETRON HCL 4 MG/2ML IJ SOLN
4.0000 mg | Freq: Once | INTRAMUSCULAR | Status: AC
  Administered 2015-02-26: 4 mg via INTRAVENOUS
  Filled 2015-02-26: qty 2

## 2015-02-26 NOTE — Discharge Instructions (Signed)
Blood Glucose Monitoring °Monitoring your blood glucose (also know as blood sugar) helps you to manage your diabetes. It also helps you and your health care provider monitor your diabetes and determine how well your treatment plan is working. °WHY SHOULD YOU MONITOR YOUR BLOOD GLUCOSE? °· It can help you understand how food, exercise, and medicine affect your blood glucose. °· It allows you to know what your blood glucose is at any given moment. You can quickly tell if you are having low blood glucose (hypoglycemia) or high blood glucose (hyperglycemia). °· It can help you and your health care provider know how to adjust your medicines. °· It can help you understand how to manage an illness or adjust medicine for exercise. °WHEN SHOULD YOU TEST? °Your health care provider will help you decide how often you should check your blood glucose. This may depend on the type of diabetes you have, your diabetes control, or the types of medicines you are taking. Be sure to write down all of your blood glucose readings so that this information can be reviewed with your health care provider. See below for examples of testing times that your health care provider may suggest. °Type 1 Diabetes °· Test 4 times a day if you are in good control, using an insulin pump, or perform multiple daily injections. °· If your diabetes is not well controlled or if you are sick, you may need to monitor more often. °· It is a good idea to also monitor: °¨ Before and after exercise. °¨ Between meals and 2 hours after a meal. °¨ Occasionally between 2:00 a.m. and 3:00 a.m. °Type 2 Diabetes °· It can vary with each person, but generally, if you are on insulin, test 4 times a day. °· If you take medicines by mouth (orally), test 2 times a day. °· If you are on a controlled diet, test once a day. °· If your diabetes is not well controlled or if you are sick, you may need to monitor more often. °HOW TO MONITOR YOUR BLOOD GLUCOSE °Supplies  Needed °· Blood glucose meter. °· Test strips for your meter. Each meter has its own strips. You must use the strips that go with your own meter. °· A pricking needle (lancet). °· A device that holds the lancet (lancing device). °· A journal or log book to write down your results. °Procedure °· Wash your hands with soap and water. Alcohol is not preferred. °· Prick the side of your finger (not the tip) with the lancet. °· Gently milk the finger until a small drop of blood appears. °· Follow the instructions that come with your meter for inserting the test strip, applying blood to the strip, and using your blood glucose meter. °Other Areas to Get Blood for Testing °Some meters allow you to use other areas of your body (other than your finger) to test your blood. These areas are called alternative sites. The most common alternative sites are: °· The forearm. °· The thigh. °· The back area of the lower leg. °· The palm of the hand. °The blood flow in these areas is slower. Therefore, the blood glucose values you get may be delayed, and the numbers are different from what you would get from your fingers. Do not use alternative sites if you think you are having hypoglycemia. Your reading will not be accurate. Always use a finger if you are having hypoglycemia. Also, if you cannot feel your lows (hypoglycemia unawareness), always use your fingers for your   blood glucose checks. ADDITIONAL TIPS FOR GLUCOSE MONITORING  Do not reuse lancets.  Always carry your supplies with you.  All blood glucose meters have a 24-hour "hotline" number to call if you have questions or need help.  Adjust (calibrate) your blood glucose meter with a control solution after finishing a few boxes of strips. BLOOD GLUCOSE RECORD KEEPING It is a good idea to keep a daily record or log of your blood glucose readings. Most glucose meters, if not all, keep your glucose records stored in the meter. Some meters come with the ability to download  your records to your home computer. Keeping a record of your blood glucose readings is especially helpful if you are wanting to look for patterns. Make notes to go along with the blood glucose readings because you might forget what happened at that exact time. Keeping good records helps you and your health care provider to work together to achieve good diabetes management.  Document Released: 08/17/2003 Document Revised: 12/29/2013 Document Reviewed: 01/06/2013 [facility] Patient Information 2015 [place], [place]. This information is not intended to replace advice given to you by your health care provider. Make sure you discuss any questions you have with your health care provider. Each ear treated for hypoglycemia sugar came down to 360.  After a liter fluid, if he gets going to stay elevated for the next one to 2 days at a reaction to Vistaril injection that you have in your back, your CT scan, which was performed due to chronic diarrhea and weight loss is normal.  I recommend that you keep your appointment with gastroenterology as scheduled on July 13

## 2015-02-26 NOTE — ED Notes (Signed)
Pt reports chest pain, states it "could be GERD".  Pain is rated as a 6 out of 10 and is described as in the center of her chest.  Provider notified

## 2015-02-26 NOTE — ED Provider Notes (Addendum)
CSN: [id number]     Arrival date & time 02/25/15  2238 History   First MD Initiated Contact with Patient 02/25/15 2336     Chief Complaint  Patient presents with  . Abdominal Pain     (Consider location/radiation/quality/duration/timing/severity/associated sxs/prior Treatment) HPI Comments: Is a 64 year old female, with, get a medical history including non-insulin-dependent diabetes, gastroparesis, GERD, who presents with 3 weeks of worsening epigastric discomfort, pain with eating, nausea but no vomiting today.  She reports that her blood sugar is elevated.  Her monitor would not read it was so high, but she did receive a sterile injection in her back.  Yesterday.  She's had some shortness of breath.  She normally wears 2 L of oxygen at home at all times  Patient is a 64 y.o. female presenting with abdominal pain. The history is provided by the patient.  Abdominal Pain Pain location:  Epigastric Pain quality: aching   Pain radiates to:  Does not radiate Pain severity:  Mild Onset quality:  Gradual Duration:  3 weeks Timing:  Constant Progression:  Worsening Chronicity:  Chronic Context: eating   Relieved by:  Nothing Worsened by:  Eating Associated symptoms: nausea and shortness of breath   Associated symptoms: no chest pain, no cough, no fever and no vomiting     Past Medical History  Diagnosis Date  . Hyperlipidemia   . Benign neoplasm of stomach   . Dizziness and giddiness   . Gastroparesis   . Esophageal reflux   . CAD (coronary artery disease)     a.  cath 2005: LAD 40%;  b. Myoview 3/12: EF 70%, no ischemia;  c. Myoview 9/13: EF 76%, no ischemia  . HTN (hypertension)   . Palpitations   . Depression   . Aortic stenosis     Echo 8/13: EF 55-60%, Mild LVH, mild AS, mean gradient 12 mmHg,   . CHF (congestive heart failure) 06-13-12    episode after Cancer surgery  . PONV (postoperative nausea and vomiting)   . DM2 (diabetes mellitus, type 2) 06-13-12    NIDDM  .  Ovarian cancer 06-13-12    surgery only x2-no recent problems  . Chronic hepatitis C without mention of hepatic coma 06-13-12    "states is active"  . Sleep apnea 06-13-12    does not use cpap, cpap setting of 2 when used cpap   Past Surgical History  Procedure Laterality Date  . Tubal ligation    . Abdominal hysterectomy    . Oophorectomy      1 ovary removed, then later last ovary removed  . Tonsillectomy  age 42  . Bladder suspension  yrs ago    with posterior repair  . Knee arthroscopy      left  . Cataract extraction Bilateral 03/2013  . Breast biopsy  1972    left  . Abdominal hysterectomy  yrs ago  . Cholecystectomy  5 yrs ago  . Colonoscopy N/A 08/11/2013    Procedure: COLONOSCOPY;  Surgeon: Inda Castle, MD;  Location: [facility];  Service: Endoscopy;  Laterality: N/A;   Family History  Problem Relation Age of Onset  . Brain cancer Father     spine cancer  . Heart disease Mother   . Breast cancer Sister   . Kidney disease Sister   . Diabetes Brother   . Esophageal cancer Brother   . Diabetes Maternal Aunt   . Diabetes Paternal Aunt   . Stomach cancer Paternal Grandfather  History  Substance Use Topics  . Smoking status: Never Smoker   . Smokeless tobacco: Never Used  . Alcohol Use: No     Comment: past hx. , no current use in 11 yrs.   OB History    No data available     Review of Systems  Constitutional: Negative for fever.  Respiratory: Positive for shortness of breath. Negative for cough and wheezing.   Cardiovascular: Negative for chest pain.  Gastrointestinal: Positive for nausea and abdominal pain. Negative for vomiting.  Musculoskeletal: Negative for back pain.  Skin: Negative for rash and wound.  Neurological: Negative for dizziness and headaches.  All other systems reviewed and are negative.     Allergies  Sunflower oil; Iodine; Metoclopramide hcl; and Sulfamethoxazole  Home Medications   Prior to Admission medications    Medication Sig Start Date End Date Taking? Authorizing Provider  aspirin 325 MG tablet Take 1 tablet (325 mg total) by mouth daily. 11/13/13  Yes Orson Eva, MD  bisoprolol (ZEBETA) 5 MG tablet Take 1 tablet (5 mg total) by mouth daily. 12/08/14  Yes Rogelia Mire, NP  gabapentin (NEURONTIN) 300 MG capsule Take 300 mg by mouth 2 (two) times daily.   Yes Historical Provider, MD  gemfibrozil (LOPID) 600 MG tablet Take 600 mg by mouth 2 (two) times daily before a meal.   Yes Historical Provider, MD  glimepiride (AMARYL) 4 MG tablet Take 4 mg by mouth every morning.  06/21/13  Yes Historical Provider, MD  HYDROcodone-acetaminophen (NORCO) 7.5-325 MG per tablet Take 1 tablet by mouth 2 (two) times daily.  07/08/13  Yes Historical Provider, MD  hydrOXYzine (VISTARIL) 25 MG capsule Take 25 mg by mouth daily. 01/15/15  Yes Historical Provider, MD  lisinopril (PRINIVIL,ZESTRIL) 20 MG tablet Take 20 mg by mouth daily.   Yes Historical Provider, MD  metFORMIN (GLUCOPHAGE) 1000 MG tablet Take 1,000 mg by mouth 2 (two) times daily. 01/07/15  Yes Historical Provider, MD  ondansetron (ZOFRAN) 4 MG tablet Take 1 tablet (4 mg total) by mouth every 8 (eight) hours as needed for nausea or vomiting. 01/26/15  Yes Inda Castle, MD  pantoprazole (PROTONIX) 40 MG tablet TAKE ONE TABLET BY MOUTH TWICE DAILY BEFORE BREAKFAST AND DINNER 02/04/15  Yes Inda Castle, MD  RESTASIS 0.05 % ophthalmic emulsion Place 1 drop into both eyes 2 (two) times daily.  05/20/13  Yes Historical Provider, MD  solifenacin (VESICARE) 5 MG tablet Take 5 mg by mouth daily.    Yes Historical Provider, MD  traMADol (ULTRAM) 50 MG tablet Take 50 mg by mouth every 6 (six) hours as needed for moderate pain.  07/08/13  Yes Historical Provider, MD  traZODone (DESYREL) 50 MG tablet Take 50 mg by mouth at bedtime as needed for sleep.  05/07/14  Yes Historical Provider, MD  OXYGEN-HELIUM IN 2-3 L by Other route continuous.     Historical Provider, MD   pantoprazole (PROTONIX) 40 MG tablet TAKE ONE TABLET BY MOUTH TWICE DAILY BEFORE BREAKFAST AND DINNER 03/02/15   Inda Castle, MD  promethazine (PHENERGAN) 12.5 MG tablet 1 tablet every 6 hours as needed for nausea and vomiting 02/26/15   Junius Creamer, NP   BP 116/62 mmHg  Pulse 87  Temp(Src) 98.3 F (36.8 C)  Resp 25  Ht 5\' 6"  (1.676 m)  Wt 180 lb 2 oz (81.704 kg)  BMI 29.09 kg/m2  SpO2 97% Physical Exam  Constitutional: She is oriented to person, place, and time.  She appears well-developed and well-nourished.  HENT:  Head: Normocephalic.  Eyes: Pupils are equal, round, and reactive to light.  Neck: Normal range of motion.  Cardiovascular: Normal rate and regular rhythm.   Pulmonary/Chest: Effort normal.  Abdominal: Soft.  Musculoskeletal: Normal range of motion.  Neurological: She is alert and oriented to person, place, and time.  Skin: Skin is warm.    ED Course  Procedures (including critical care time) Labs Review Labs Reviewed  CBC WITH DIFFERENTIAL/PLATELET - Abnormal; Notable for the following:    HCT 35.3 (*)    Neutrophils Relative % 86 (*)    Lymphs Abs 0.5 (*)    Monocytes Relative 2 (*)    All other components within normal limits  COMPREHENSIVE METABOLIC PANEL - Abnormal; Notable for the following:    CO2 20 (*)    Glucose, Bld 510 (*)    Creatinine, Ser 1.01 (*)    AST 69 (*)    GFR calc non Af Amer 58 (*)    All other components within normal limits  CBG MONITORING, ED - Abnormal; Notable for the following:    Glucose-Capillary 451 (*)    All other components within normal limits  CBG MONITORING, ED - Abnormal; Notable for the following:    Glucose-Capillary 360 (*)    All other components within normal limits    Imaging Review No results found.   EKG Interpretation   Date/Time:  Thursday February 25 2015 22:46:15 EDT Ventricular Rate:  100 PR Interval:  136 QRS Duration: 92 QT Interval:  362 QTC Calculation: 466 R Axis:   -14 Text  Interpretation:  Normal sinus rhythm RSR' or QR pattern in V1  suggests right ventricular conduction delay Minimal voltage criteria for  LVH, may be normal variant Inferior infarct , age undetermined T wave  abnormality, consider anterior ischemia Abnormal ECG When compared with  ECG of 11/25/2014, No significant change was found Confirmed by [facility]  MD,  DAVID ([id number]) on 02/25/2015 11:44:09 PM Also confirmed by Roxanne Mins  MD, DAVID  ([id number]), editor [place]  CCT, BEVERLY ([id number])  on 02/26/2015 7:06:41 AM     Patient CT scan is unremarkable.  She has no indication of colitis or other indication/cause for her diarrhea.  She does have an appointment with gastroenterology for further evaluation on July 13 Her blood sugar came down to 361 L of fluid (a reaction to this to her injection that she got in her back Resume indication of infection anywhere She has been instructed to follow-up with her primary care physician today by phone MDM   Final diagnoses:  Diarrhea  Hyperglycemia         Junius Creamer, NP 02/26/15 [place], NP 09/81/19 1478  David Glick, MD 29/56/21 3086  Junius Creamer, NP 03/04/15 1956  Junius Creamer, NP 57/84/69 [id number]  Delora Fuel, MD 28/41/32 [id number]

## 2015-02-28 ENCOUNTER — Other Ambulatory Visit: Payer: Self-pay | Admitting: Gastroenterology

## 2015-03-10 ENCOUNTER — Ambulatory Visit: Payer: Medicare Other | Admitting: Physician Assistant

## 2015-03-31 ENCOUNTER — Other Ambulatory Visit: Payer: Self-pay | Admitting: Gastroenterology

## 2015-04-06 ENCOUNTER — Ambulatory Visit: Payer: Medicare Other | Admitting: Gastroenterology

## 2015-04-16 ENCOUNTER — Other Ambulatory Visit: Payer: Self-pay | Admitting: Gastroenterology

## 2015-05-15 ENCOUNTER — Other Ambulatory Visit: Payer: Self-pay | Admitting: Gastroenterology

## 2015-05-27 ENCOUNTER — Encounter: Payer: Self-pay | Admitting: *Deleted

## 2015-05-27 ENCOUNTER — Other Ambulatory Visit: Payer: Self-pay | Admitting: *Deleted

## 2015-05-30 NOTE — Progress Notes (Signed)
Pt did not come for appt  This encounter was created in error - please disregard.

## 2015-05-31 ENCOUNTER — Encounter: Payer: Medicare Other | Admitting: Internal Medicine

## 2015-06-01 ENCOUNTER — Other Ambulatory Visit: Payer: Self-pay | Admitting: Nurse Practitioner

## 2015-06-02 ENCOUNTER — Encounter: Payer: Self-pay | Admitting: Internal Medicine

## 2015-06-04 ENCOUNTER — Encounter: Payer: Self-pay | Admitting: Internal Medicine

## 2015-06-04 ENCOUNTER — Ambulatory Visit (INDEPENDENT_AMBULATORY_CARE_PROVIDER_SITE_OTHER): Payer: Medicare Other | Admitting: Internal Medicine

## 2015-06-04 VITALS — BP 130/68 | HR 91 | Ht 66.0 in | Wt 182.6 lb

## 2015-06-04 DIAGNOSIS — I10 Essential (primary) hypertension: Secondary | ICD-10-CM | POA: Diagnosis not present

## 2015-06-04 DIAGNOSIS — R079 Chest pain, unspecified: Secondary | ICD-10-CM | POA: Diagnosis not present

## 2015-06-04 DIAGNOSIS — I2510 Atherosclerotic heart disease of native coronary artery without angina pectoris: Secondary | ICD-10-CM | POA: Diagnosis not present

## 2015-06-04 MED ORDER — ATORVASTATIN CALCIUM 10 MG PO TABS: 10.0000 mg | ORAL_TABLET | Freq: Every day | ORAL | Status: DC

## 2015-06-04 NOTE — Patient Instructions (Addendum)
Medication Instructions:  Your physician has recommended you make the following change in your medication:   1.  START taking Lipitor 10 mg tablet taking 1 tablet daily (this has been called into [facility] on [address]  Worthy Keeler: None ordered  Testing/Procedures: None ordered  Follow-Up: Your physician wants you to follow-up in: [place].   You will receive a reminder letter in the mail two months in advance. If you don't receive a letter, please call our office to schedule the follow-up appointment.     Any Other Special Instructions Will Be Listed Below (If Applicable).  We have called Dr. Dar[facility], to schedule you a follow-up appointment for November 14 @ 4:00. It is important that you follow-up with him, so if you are unable to keep this appointment, please call their office at [phone] and reschedule as soon as you can.

## 2015-06-04 NOTE — Progress Notes (Signed)
Cardiology Office Note   Date:  38/08/173   ID:  [name] [name], DOB [birth year], MRN [id number]  PCP:  Vicenta Aly, FNP  Cardiologist:   Dorris Carnes, MD   No chief complaint on file.  F/U of CAD    History of Present Illness: [name] [name] is a 64 y.o. female with a history of minimal CAD by cath in 2005 (LAD 40%), DM2, HL, GERD, HTN, chronic HCV, carotid stenosis, chest pain and palpitations. Myoview 3/12: EF 70%, no ischemia. Carotid Dopplers 10/10: RICA 40-59%. She has required esophageal dilatation in the past. She is followed at the [facility] for depression. Echo demonstrated normal LVF, mild AS, mild LVH She had knee surgery last year Post procedure developed a PE  I saw the patient in May  Has seen R Byrum since   ALso seen in ER for abdominal pain.   Patient has been having CP  Mostly at night  In bed  Sharp   Lays until goes away Can be 30 min  No bitter taste  Now for the past 4 to 5 days.   Has rare chest prssure during the day   Palpitations  Heart starts suddenly going fast  Can last all day No syncope  DIzzy a lot No syncope    Bones ache    Current Outpatient Prescriptions  Medication Sig Dispense Refill  . aspirin 325 MG tablet Take 1 tablet (325 mg total) by mouth daily.    . bisoprolol (ZEBETA) 5 MG tablet TAKE ONE TABLET BY MOUTH ONCE DAILY 30 tablet 4  . DULoxetine (CYMBALTA) 30 MG capsule     . DULoxetine (CYMBALTA) 60 MG capsule     . gabapentin (NEURONTIN) 300 MG capsule Take 300 mg by mouth 2 (two) times daily.    [name] gemfibrozil (LOPID) 600 MG tablet Take 600 mg by mouth 2 (two) times daily before a meal.    . glimepiride (AMARYL) 4 MG tablet every morning. Take 2 tablets by mouth daily    . HYDROcodone-acetaminophen (NORCO) 7.5-325 MG per tablet Take 1 tablet by mouth 2 (two) times daily.     . hydrOXYzine (VISTARIL) 25 MG capsule Take 25 mg by mouth daily.    [name] JANUVIA 100 MG tablet Take 100 mg by mouth daily.     [name] lisinopril  (PRINIVIL,ZESTRIL) 20 MG tablet Take 20 mg by mouth daily.    [name] LORazepam (ATIVAN) 1 MG tablet Take 1 mg by mouth at bedtime.     . metFORMIN (GLUCOPHAGE) 1000 MG tablet Take 1,000 mg by mouth 2 (two) times daily.    . ondansetron (ZOFRAN) 4 MG tablet Take 1 tablet (4 mg total) by mouth every 8 (eight) hours as needed for nausea or vomiting. 30 tablet 1  . OXYGEN-HELIUM IN 2-3 L by Other route continuous.     . RESTASIS 0.05 % ophthalmic emulsion Place 1 drop into both eyes 2 (two) times daily.     . solifenacin (VESICARE) 5 MG tablet Take 5 mg by mouth daily.     . traMADol (ULTRAM) 50 MG tablet Take 50 mg by mouth every 6 (six) hours as needed for moderate pain.     . traZODone (DESYREL) 50 MG tablet Take 50 mg by mouth at bedtime as needed for sleep.      No current facility-administered medications for this visit.    Allergies:   Sunflower oil; Iodine; Metoclopramide hcl; and Sulfamethoxazole   Past Medical History  Diagnosis Date  .  Hyperlipidemia   . Benign neoplasm of stomach   . Dizziness and giddiness   . Gastroparesis   . Esophageal reflux   . CAD (coronary artery disease)     a.  cath 2005: LAD 40%;  b. Myoview 3/12: EF 70%, no ischemia;  c. Myoview 9/13: EF 76%, no ischemia  . HTN (hypertension)   . Palpitations   . Depression   . Aortic stenosis     Echo 8/13: EF 55-60%, Mild LVH, mild AS, mean gradient 12 mmHg,   . CHF (congestive heart failure) ([place]) 06-13-12    episode after Cancer surgery  . PONV (postoperative nausea and vomiting)   . DM2 (diabetes mellitus, type 2) ([place]) 06-13-12    NIDDM  . Ovarian cancer ([place]) 06-13-12    surgery only x2-no recent problems  . Chronic hepatitis C without mention of hepatic coma 06-13-12    "states is active"  . Sleep apnea 06-13-12    does not use cpap, cpap setting of 2 when used cpap    Past Surgical History  Procedure Laterality Date  . Tubal ligation    . Abdominal hysterectomy    . Oophorectomy      1 ovary  removed, then later last ovary removed  . Tonsillectomy  age 10  . Bladder suspension  yrs ago    with posterior repair  . Knee arthroscopy      left  . Cataract extraction Bilateral 03/2013  . Breast biopsy  1972    left  . Abdominal hysterectomy  yrs ago  . Cholecystectomy  5 yrs ago  . Colonoscopy N/A 08/11/2013    Procedure: COLONOSCOPY;  Surgeon: Inda Castle, MD;  Location: [facility];  Service: Endoscopy;  Laterality: N/A;     Social History:  The patient  reports that she has never smoked. She has never used smokeless tobacco. She reports that she uses illicit drugs ("Crack" cocaine). She reports that she does not drink alcohol.   Family History:  The patient's family history includes Brain cancer in her father; Breast cancer in her sister; Diabetes in her brother, maternal aunt, and paternal aunt; Esophageal cancer in her brother; Heart disease in her mother; Kidney disease in her sister; Stomach cancer in her paternal grandfather.    ROS:  Please see the history of present illness. All other systems are reviewed and  Negative to the above problem except as noted.    PHYSICAL EXAM: VS:  BP 130/68 mmHg  Pulse 91  Ht 5\' 6"  (1.676 m)  Wt 182 lb 9.6 oz (82.827 kg)  BMI 29.49 kg/m2  SpO2 94%  GEN: Well nourished, well developed, in no acute distress HEENT: normal Neck: no JVD, carotid bruits, or masses Cardiac: RRR; no murmurs, rubs, or gallops,no edema  Respiratory:  clear to auscultation bilaterally, normal work of breathing GI: soft, nontender, nondistended, + BS  No hepatomegaly  MS: no deformity Moving all extremities   Skin: warm and dry, no rash Neuro:  Strength and sensation are intact Psych: euthymic mood, full affect   EKG:  EKG is ordered today.  NSR 91 bpm  Incomp RBBB   Lipid Panel    Component Value Date/Time   CHOL 136 11/12/2013 0505   TRIG 374* 11/12/2013 0505   HDL 19* 11/12/2013 0505   CHOLHDL 7.2 11/12/2013 0505   VLDL 75* 11/12/2013  0505   LDLCALC 42 11/12/2013 0505   LDLDIRECT 63.6 07/29/2009 0945      Wt Readings from  Last 3 Encounters:  06/04/15 182 lb 9.6 oz (82.827 kg)  02/25/15 180 lb 2 oz (81.704 kg)  01/11/15 181 lb (82.101 kg)      ASSESSMENT AND PLAN:  1  CAD I am not convinced of active angina  Her CP symptoms are atypical  Woud continue to follow  I would not plan testing for now  2.  HL  Trig high prob from glucose elevation  Needs tighter control   I would recomm that she start a statin given CAD  Start lipitor 80  F/U with primeary MD    3. Palpitations  I am not convinced of an arrhythmia  She says urine is orange color  She may not be drinking enough during day  Encouaged her to increase fluid intake    4.  Dizziness  Encouraged her to increase her fluid intake  5 DM  Glucose levels have been high  Has f/u with primary MD in 4 wks  6  Pulm  Pt on chronic O2 Will make sure she has fu in pulmonary .       Signed, Dorris Carnes, MD  06/04/2015 8:43 AM    [address], [place], [place]  [address] Phone: [phone]; Fax: [phone]

## 2015-07-01 ENCOUNTER — Emergency Department (HOSPITAL_COMMUNITY): Payer: Medicare Other

## 2015-07-01 ENCOUNTER — Observation Stay (HOSPITAL_COMMUNITY): Payer: Medicare Other

## 2015-07-01 ENCOUNTER — Encounter (HOSPITAL_COMMUNITY): Payer: Self-pay | Admitting: Emergency Medicine

## 2015-07-01 ENCOUNTER — Inpatient Hospital Stay (HOSPITAL_COMMUNITY)
Admission: EM | Admit: 2015-07-01 | Discharge: 2015-07-03 | DRG: 948 | Disposition: A | Discharge status: Home / Self Care | Payer: Medicare Other | Attending: Family Medicine | Admitting: Family Medicine

## 2015-07-01 DIAGNOSIS — E119 Type 2 diabetes mellitus without complications: Secondary | ICD-10-CM | POA: Diagnosis present

## 2015-07-01 DIAGNOSIS — R072 Precordial pain: Secondary | ICD-10-CM | POA: Diagnosis not present

## 2015-07-01 DIAGNOSIS — R0789 Other chest pain: Secondary | ICD-10-CM | POA: Diagnosis not present

## 2015-07-01 DIAGNOSIS — R4182 Altered mental status, unspecified: Principal | ICD-10-CM | POA: Diagnosis present

## 2015-07-01 DIAGNOSIS — J9611 Chronic respiratory failure with hypoxia: Secondary | ICD-10-CM | POA: Diagnosis present

## 2015-07-01 DIAGNOSIS — R079 Chest pain, unspecified: Secondary | ICD-10-CM

## 2015-07-01 DIAGNOSIS — G4733 Obstructive sleep apnea (adult) (pediatric): Secondary | ICD-10-CM | POA: Diagnosis present

## 2015-07-01 DIAGNOSIS — K648 Other hemorrhoids: Secondary | ICD-10-CM | POA: Diagnosis present

## 2015-07-01 DIAGNOSIS — Z9981 Dependence on supplemental oxygen: Secondary | ICD-10-CM

## 2015-07-01 DIAGNOSIS — R410 Disorientation, unspecified: Secondary | ICD-10-CM | POA: Diagnosis not present

## 2015-07-01 DIAGNOSIS — Z136 Encounter for screening for cardiovascular disorders: Secondary | ICD-10-CM | POA: Diagnosis not present

## 2015-07-01 DIAGNOSIS — Z794 Long term (current) use of insulin: Secondary | ICD-10-CM

## 2015-07-01 DIAGNOSIS — I10 Essential (primary) hypertension: Secondary | ICD-10-CM | POA: Diagnosis present

## 2015-07-01 DIAGNOSIS — D649 Anemia, unspecified: Secondary | ICD-10-CM | POA: Diagnosis present

## 2015-07-01 DIAGNOSIS — I25118 Atherosclerotic heart disease of native coronary artery with other forms of angina pectoris: Secondary | ICD-10-CM | POA: Diagnosis not present

## 2015-07-01 DIAGNOSIS — E1142 Type 2 diabetes mellitus with diabetic polyneuropathy: Secondary | ICD-10-CM | POA: Diagnosis not present

## 2015-07-01 DIAGNOSIS — E782 Mixed hyperlipidemia: Secondary | ICD-10-CM | POA: Diagnosis present

## 2015-07-01 HISTORY — DX: Chronic kidney disease, unspecified: N18.9

## 2015-07-01 HISTORY — DX: Fibromyalgia: M79.7

## 2015-07-01 HISTORY — DX: Other chronic pain: G89.29

## 2015-07-01 HISTORY — DX: Altered mental status, unspecified: R41.82

## 2015-07-01 HISTORY — DX: Anemia, unspecified: D64.9

## 2015-07-01 HISTORY — DX: Family history of other specified conditions: Z84.89

## 2015-07-01 HISTORY — DX: Low back pain: M54.5

## 2015-07-01 HISTORY — DX: Personal history of other medical treatment: Z92.89

## 2015-07-01 HISTORY — DX: Chronic obstructive pulmonary disease, unspecified: J44.9

## 2015-07-01 HISTORY — DX: Low back pain, unspecified: M54.50

## 2015-07-01 HISTORY — DX: Anxiety disorder, unspecified: F41.9

## 2015-07-01 LAB — ETHANOL: Alcohol, Ethyl (B): <5 mg/dL (ref <5)

## 2015-07-01 LAB — RAPID URINE DRUG SCREEN, HOSP PERFORMED
Amphetamines: NOT DETECTED
BARBITURATES: NOT DETECTED
BENZODIAZEPINES: POSITIVE — AB
Cocaine: NOT DETECTED
Opiates: NOT DETECTED
Tetrahydrocannabinol: NOT DETECTED

## 2015-07-01 LAB — ACETAMINOPHEN LEVEL: Acetaminophen (Tylenol), Serum: <10 ug/mL — ABNORMAL LOW (ref 10–30)

## 2015-07-01 LAB — I-STAT ARTERIAL BLOOD GAS, ED
Acid-base deficit: 3 mmol/L — ABNORMAL HIGH (ref 0.0–2.0)
Bicarbonate: 22.5 mEq/L (ref 20.0–24.0)
O2 SAT: 95 %
PCO2 ART: 41.7 mmHg (ref 35.0–45.0)
PO2 ART: 79 mmHg — AB (ref 80.0–100.0)
Patient temperature: 98.6
TCO2: 24 mmol/L (ref 0–100)
pH, Arterial: 7.34 — ABNORMAL LOW (ref 7.350–7.450)

## 2015-07-01 LAB — URINALYSIS, ROUTINE W REFLEX MICROSCOPIC
Bilirubin Urine: NEGATIVE
GLUCOSE, UA: NEGATIVE mg/dL
HGB URINE DIPSTICK: NEGATIVE
KETONES UR: NEGATIVE mg/dL
Nitrite: NEGATIVE
PH: 5.5 (ref 5.0–8.0)
Protein, ur: NEGATIVE mg/dL
Specific Gravity, Urine: 1.01 (ref 1.005–1.030)
Urobilinogen, UA: 1 mg/dL (ref 0.0–1.0)

## 2015-07-01 LAB — COMPREHENSIVE METABOLIC PANEL
ALT: 73 U/L — AB (ref 14–54)
AST: 112 U/L — AB (ref 15–41)
Albumin: 3 g/dL — ABNORMAL LOW (ref 3.5–5.0)
Alkaline Phosphatase: 71 U/L (ref 38–126)
Anion gap: 8 (ref 5–15)
BILIRUBIN TOTAL: 0.8 mg/dL (ref 0.3–1.2)
BUN: 12 mg/dL (ref 6–20)
CO2: 21 mmol/L — ABNORMAL LOW (ref 22–32)
CREATININE: 1.07 mg/dL — AB (ref 0.44–1.00)
Calcium: 8.2 mg/dL — ABNORMAL LOW (ref 8.9–10.3)
Chloride: 107 mmol/L (ref 101–111)
GFR, EST NON AFRICAN AMERICAN: 54 mL/min — AB (ref >60)
Glucose, Bld: 243 mg/dL — ABNORMAL HIGH (ref 65–99)
POTASSIUM: 4.5 mmol/L (ref 3.5–5.1)
Sodium: 136 mmol/L (ref 135–145)
TOTAL PROTEIN: 7.3 g/dL (ref 6.5–8.1)

## 2015-07-01 LAB — CBC
HCT: 33.7 % — ABNORMAL LOW (ref 36.0–46.0)
Hemoglobin: 10.8 g/dL — ABNORMAL LOW (ref 12.0–15.0)
MCH: 28.7 pg (ref 26.0–34.0)
MCHC: 32 g/dL (ref 30.0–36.0)
MCV: 89.6 fL (ref 78.0–100.0)
PLATELETS: 172 10*3/uL (ref 150–400)
RBC: 3.76 MIL/uL — AB (ref 3.87–5.11)
RDW: 13.7 % (ref 11.5–15.5)
WBC: 7.6 10*3/uL (ref 4.0–10.5)

## 2015-07-01 LAB — URINE MICROSCOPIC-ADD ON

## 2015-07-01 LAB — AMMONIA: AMMONIA: 30 umol/L (ref 9–35)

## 2015-07-01 LAB — TYPE AND SCREEN
ABO/RH(D): O POS
ANTIBODY SCREEN: NEGATIVE

## 2015-07-01 LAB — BRAIN NATRIURETIC PEPTIDE: B NATRIURETIC PEPTIDE 5: 39.5 pg/mL (ref 0.0–100.0)

## 2015-07-01 LAB — TROPONIN I: Troponin I: <0.03 ng/mL (ref <0.031)

## 2015-07-01 LAB — I-STAT CG4 LACTIC ACID, ED
LACTIC ACID, VENOUS: 1.01 mmol/L (ref 0.5–2.0)
Lactic Acid, Venous: 1.85 mmol/L (ref 0.5–2.0)

## 2015-07-01 LAB — GLUCOSE, CAPILLARY: GLUCOSE-CAPILLARY: 104 mg/dL — AB (ref 65–99)

## 2015-07-01 LAB — TSH: TSH: 0.413 u[IU]/mL (ref 0.350–4.500)

## 2015-07-01 LAB — I-STAT TROPONIN, ED: Troponin i, poc: 0 ng/mL (ref 0.00–0.08)

## 2015-07-01 LAB — ABO/RH: ABO/RH(D): O POS

## 2015-07-01 LAB — SALICYLATE LEVEL

## 2015-07-01 LAB — POC OCCULT BLOOD, ED: FECAL OCCULT BLD: NEGATIVE

## 2015-07-01 MED ORDER — ASPIRIN 325 MG PO TABS
325.0000 mg | ORAL_TABLET | Freq: Every day | ORAL | Status: DC
  Administered 2015-07-01 – 2015-07-03 (×3): 325 mg via ORAL
  Filled 2015-07-01 (×3): qty 1

## 2015-07-01 MED ORDER — SODIUM CHLORIDE 0.9 % IJ SOLN
3.0000 mL | Freq: Two times a day (BID) | INTRAMUSCULAR | Status: DC
  Administered 2015-07-01 – 2015-07-03 (×4): 3 mL via INTRAVENOUS

## 2015-07-01 MED ORDER — ONDANSETRON HCL 4 MG/2ML IJ SOLN
4.0000 mg | Freq: Four times a day (QID) | INTRAMUSCULAR | Status: DC | PRN
  Administered 2015-07-02: 4 mg via INTRAVENOUS
  Filled 2015-07-01: qty 2

## 2015-07-01 MED ORDER — SODIUM CHLORIDE 0.9 % IV SOLN
INTRAVENOUS | Status: AC
  Administered 2015-07-01: 100 mL/h via INTRAVENOUS

## 2015-07-01 MED ORDER — BISOPROLOL FUMARATE 5 MG PO TABS
5.0000 mg | ORAL_TABLET | Freq: Every day | ORAL | Status: DC
  Administered 2015-07-02 – 2015-07-03 (×2): 5 mg via ORAL
  Filled 2015-07-01 (×2): qty 1

## 2015-07-01 MED ORDER — IOHEXOL 300 MG/ML  SOLN
100.0000 mL | Freq: Once | INTRAMUSCULAR | Status: AC | PRN
  Administered 2015-07-01: 100 mL via INTRAVENOUS

## 2015-07-01 MED ORDER — INSULIN ASPART 100 UNIT/ML ~~LOC~~ SOLN
0.0000 [IU] | Freq: Three times a day (TID) | SUBCUTANEOUS | Status: DC
  Administered 2015-07-02 (×2): 3 [IU] via SUBCUTANEOUS
  Administered 2015-07-02 – 2015-07-03 (×2): 2 [IU] via SUBCUTANEOUS
  Administered 2015-07-03: 5 [IU] via SUBCUTANEOUS
  Administered 2015-07-03: 2 [IU] via SUBCUTANEOUS

## 2015-07-01 MED ORDER — STROKE: EARLY STAGES OF RECOVERY BOOK
Freq: Once | Status: AC
  Administered 2015-07-01: 18:00:00
  Filled 2015-07-01: qty 1

## 2015-07-01 MED ORDER — DEXTROSE-NACL 5-0.45 % IV SOLN
INTRAVENOUS | Status: DC
  Administered 2015-07-01 – 2015-07-02 (×2): via INTRAVENOUS

## 2015-07-01 MED ORDER — ENOXAPARIN SODIUM 40 MG/0.4ML ~~LOC~~ SOLN
40.0000 mg | SUBCUTANEOUS | Status: DC
  Administered 2015-07-01 – 2015-07-02 (×2): 40 mg via SUBCUTANEOUS
  Filled 2015-07-01 (×2): qty 0.4

## 2015-07-01 MED ORDER — ONDANSETRON HCL 4 MG/2ML IJ SOLN
4.0000 mg | Freq: Once | INTRAMUSCULAR | Status: AC
  Administered 2015-07-01: 4 mg via INTRAVENOUS
  Filled 2015-07-01: qty 2

## 2015-07-01 MED ORDER — ENOXAPARIN SODIUM 40 MG/0.4ML ~~LOC~~ SOLN: 40.0000 mg | SUBCUTANEOUS | Status: DC

## 2015-07-01 MED ORDER — DULOXETINE HCL 30 MG PO CPEP
30.0000 mg | ORAL_CAPSULE | Freq: Two times a day (BID) | ORAL | Status: DC
  Administered 2015-07-02 – 2015-07-03 (×3): 30 mg via ORAL
  Filled 2015-07-01 (×4): qty 1

## 2015-07-01 MED ORDER — DEXTROSE 5 % IV SOLN
1.0000 g | Freq: Once | INTRAVENOUS | Status: AC
  Administered 2015-07-01: 1 g via INTRAVENOUS
  Filled 2015-07-01: qty 10

## 2015-07-01 MED ORDER — PANTOPRAZOLE SODIUM 40 MG PO TBEC
40.0000 mg | DELAYED_RELEASE_TABLET | Freq: Two times a day (BID) | ORAL | Status: DC
  Administered 2015-07-02 – 2015-07-03 (×4): 40 mg via ORAL
  Filled 2015-07-01 (×4): qty 1

## 2015-07-01 MED ORDER — ATORVASTATIN CALCIUM 10 MG PO TABS
10.0000 mg | ORAL_TABLET | Freq: Every day | ORAL | Status: DC
Start: 2015-07-01 — End: 2015-07-03
  Administered 2015-07-01 – 2015-07-03 (×3): 10 mg via ORAL
  Filled 2015-07-01 (×3): qty 1

## 2015-07-01 NOTE — Consult Note (Signed)
Reason for Consult: Chest pain  Requesting Physician: Andria Frames  Cardiologist: Harrington Challenger  HPI: This is a 64 y.o. female with a past medical history significant for mild CAD by remote angiography (40% LAD 2005, normal nuclear study 2013), HFPEF (moderate LVH, EF 70%), minimal aortic valve stenosis, moderate right carotid stenosis with TIA 2015 (left hemiparesis), DM, history of pulmonary embolism associated with knee surgery, palpitations, GERD and esophageal stricture requiring dilation, depression, untreated OSA (did use CPAP in the past), chronic hepatitis C.  presents with disorientation/sleepiness, dysuria/frequency/urgency, reported dark tarry stools and also mentions right parasternal chest pain, pointing with the finger of one hand. Some positional features. Not exertional and not associated with other CV symptoms. She has had this before while lying in bed at night and it resolves spontaneously.  Her ECG is normal and initial biomarkers are normal. She remains lethargic, but is now conversant and fully oriented. Head CT and abdominal CT are unremarkable, CXR without CHF findings. Hgb is   PMHx:  Past Medical History  Diagnosis Date  . Hyperlipidemia   . Benign neoplasm of stomach   . Dizziness and giddiness   . Gastroparesis   . Esophageal reflux   . CAD (coronary artery disease)     a.  cath 2005: LAD 40%;  b. Myoview 3/12: EF 70%, no ischemia;  c. Myoview 9/13: EF 76%, no ischemia  . HTN (hypertension)   . Palpitations   . Depression   . Aortic stenosis     Echo 8/13: EF 55-60%, Mild LVH, mild AS, mean gradient 12 mmHg,   . CHF (congestive heart failure) ([place]) 06-13-12    episode after Cancer surgery  . PONV (postoperative nausea and vomiting)   . DM2 (diabetes mellitus, type 2) ([place]) 06-13-12    NIDDM  . Ovarian cancer ([place]) 06-13-12    surgery only x2-no recent problems  . Chronic hepatitis C without mention of hepatic coma 06-13-12    "states is active"  . Sleep  apnea 06-13-12    does not use cpap, cpap setting of 2 when used cpap   Past Surgical History  Procedure Laterality Date  . Tubal ligation    . Abdominal hysterectomy    . Oophorectomy      1 ovary removed, then later last ovary removed  . Tonsillectomy  age 64  . Bladder suspension  yrs ago    with posterior repair  . Knee arthroscopy      left  . Cataract extraction Bilateral 03/2013  . Breast biopsy  1972    left  . Abdominal hysterectomy  yrs ago  . Cholecystectomy  5 yrs ago  . Colonoscopy N/A 08/11/2013    Procedure: COLONOSCOPY;  Surgeon: Inda Castle, MD;  Location: [facility];  Service: Endoscopy;  Laterality: N/A;    FAMHx: Family History  Problem Relation Age of Onset  . Brain cancer Father     spine cancer  . Heart disease Mother   . Breast cancer Sister   . Kidney disease Sister   . Diabetes Brother   . Esophageal cancer Brother   . Diabetes Maternal Aunt   . Diabetes Paternal Aunt   . Stomach cancer Paternal Grandfather     SOCHx:  reports that she has never smoked. She has never used smokeless tobacco. She reports that she uses illicit drugs ("Crack" cocaine). She reports that she does not drink alcohol.  ALLERGIES: Allergies  Allergen Reactions  . Sunflower Oil Anaphylaxis  .  Iodine Nausea And Vomiting    Fish causes n/v  . Metoclopramide Hcl Other (See Comments)    Nerves tense up, panic attack  . Sulfamethoxazole Nausea And Vomiting    ROS: Pertinent items noted in HPI and remainder of comprehensive ROS otherwise negative.  HOME MEDICATIONS: No current facility-administered medications on file prior to encounter.   Current Outpatient Prescriptions on File Prior to Encounter  Medication Sig Dispense Refill  . aspirin 325 MG tablet Take 1 tablet (325 mg total) by mouth daily.    [name] atorvastatin (LIPITOR) 10 MG tablet Take 1 tablet (10 mg total) by mouth daily. 90 tablet 3  . bisoprolol (ZEBETA) 5 MG tablet TAKE ONE TABLET BY MOUTH ONCE  DAILY 30 tablet 4  . DULoxetine (CYMBALTA) 30 MG capsule Take 30 mg by mouth 2 (two) times daily.     [name] gabapentin (NEURONTIN) 300 MG capsule Take 600 mg by mouth 3 (three) times daily.     [name] gemfibrozil (LOPID) 600 MG tablet Take 600 mg by mouth 2 (two) times daily before a meal.    . glimepiride (AMARYL) 4 MG tablet Take 8 mg by mouth daily with breakfast.     . hydrOXYzine (VISTARIL) 25 MG capsule Take 25 mg by mouth at bedtime.     [name] JANUVIA 100 MG tablet Take 100 mg by mouth daily.     [name] lisinopril (PRINIVIL,ZESTRIL) 20 MG tablet Take 20 mg by mouth daily.    [name] LORazepam (ATIVAN) 1 MG tablet Take 1 mg by mouth at bedtime.     . metFORMIN (GLUCOPHAGE) 1000 MG tablet Take 1,000 mg by mouth 2 (two) times daily.    . OXYGEN-HELIUM IN 2-3 L by Other route continuous.     . solifenacin (VESICARE) 5 MG tablet Take 5 mg by mouth daily.     . traZODone (DESYREL) 50 MG tablet Take 50 mg by mouth at bedtime as needed for sleep.       HOSPITAL MEDICATIONS: Scheduled: . sodium chloride   Intravenous STAT    VITALS: Blood pressure 110/62, pulse 86, temperature 98.8 F (37.1 C), temperature source Oral, resp. rate 18, SpO2 97 %.  PHYSICAL EXAM:  General: Alert, oriented x3, no distress Head: no evidence of trauma, PERRL, EOMI, no exophtalmos or lid lag, no myxedema, no xanthelasma; normal ears, nose and oropharynx Neck: normal jugular venous pulsations and no hepatojugular reflux; brisk carotid pulses without delay and no carotid bruits Chest: clear to auscultation, no signs of consolidation by percussion or palpation, normal fremitus, symmetrical and full respiratory excursions Cardiovascular: normal position and quality of the apical impulse, regular rhythm, normal first heart sound and normal second heart sound, no rubs or gallops, 2/6 early peaking aortic ejection murmur Abdomen: no tenderness or distention, no masses by palpation, no abnormal pulsatility or arterial bruits, normal bowel  sounds, no hepatosplenomegaly Extremities: no clubbing, cyanosis;  no edema; 2+ radial, ulnar and brachial pulses bilaterally; 2+ right femoral, posterior tibial and dorsalis pedis pulses; 2+ left femoral, posterior tibial and dorsalis pedis pulses; no subclavian or femoral bruits Neurological: grossly nonfocal   LABS  CBC  Recent Labs  07/01/15 1030  WBC 7.6  HGB 10.8*  HCT 33.7*  MCV 89.6  PLT 101   Basic Metabolic Panel  Recent Labs  07/01/15 1030  NA 136  K 4.5  CL 107  CO2 21*  GLUCOSE 243*  BUN 12  CREATININE 1.07*  CALCIUM 8.2*   Liver Function Tests  Recent  Labs  07/01/15 1030  AST 112*  ALT 73*  ALKPHOS 71  BILITOT 0.8  PROT 7.3  ALBUMIN 3.0*   No results for input(s): LIPASE, AMYLASE in the last 72 hours. Cardiac Enzymes  Recent Labs  07/01/15 1030  TROPONINI <0.03    IMAGING: Ct Head Wo Contrast  07/01/2015  CLINICAL DATA:  Chest pain, hypotension, altered mental status EXAM: CT HEAD WITHOUT CONTRAST TECHNIQUE: Contiguous axial images were obtained from the base of the skull through the vertex without intravenous contrast. COMPARISON:  11/12/2013 FINDINGS: No skull fracture is noted. Paranasal sinuses and mastoid air cells are unremarkable. No intracranial hemorrhage, mass effect or midline shift. No acute cortical infarction. No mass lesion is noted on this unenhanced scan. IMPRESSION: No acute intracranial abnormality. Electronically Signed   By: Lahoma Crocker M.D.   On: 07/01/2015 11:44   Ct Abdomen Pelvis W Contrast  07/01/2015  CLINICAL DATA:  Nausea, vomiting abdominal pain for 2 days. History of hepatitis-C. EXAM: CT ABDOMEN AND PELVIS WITH CONTRAST TECHNIQUE: Multidetector CT imaging of the abdomen and pelvis was performed using the standard protocol following bolus administration of intravenous contrast. CONTRAST:  179mL OMNIPAQUE IOHEXOL 300 MG/ML  SOLN COMPARISON:  02/26/2015 FINDINGS: Lower chest: The lung bases are clear of acute process.  Minimal dependent subpleural atelectasis. No pulmonary lesions or pleural effusion. The heart is borderline enlarged. No pericardial effusion. Coronary artery calcifications are noted. The distal esophagus is grossly normal. Hepatobiliary: Mild diffuse fatty infiltration of the liver. No focal hepatic lesions or intrahepatic biliary dilatation. The gallbladder is surgically absent. No common bile duct dilatation. Pancreas: No mass, inflammation or ductal dilatation. Spleen: Within normal limits in size. Multiple calcified granulomas are noted. Adrenals/Urinary Tract: The adrenal glands are normal in stable. Small renal cysts but no worrisome renal lesions, renal calculi or hydronephrosis. No obstructing ureteral calculi or bladder calculi. Moderate distention of the bladder is noted. Stomach/Bowel: The stomach, duodenum, small bowel and colon are grossly normal. No inflammatory changes, mass lesions or obstructive findings. The terminal ileum is normal. Vascular/Lymphatic: No mesenteric or retroperitoneal mass or adenopathy. Stable scattered borderline celiac axis and periportal lymph nodes. The aorta is normal in caliber. The branch vessels are patent. Stable atherosclerotic calcifications involving the aorta and branch vessel ostia. Other: The uterus is surgically absent. No pelvic mass or adenopathy. No free pelvic fluid collections. No inguinal mass or adenopathy. Musculoskeletal: No significant bony findings. Stable moderate degenerative changes involving the spine and osteoporosis. IMPRESSION: 1. No acute abdominal/pelvic findings, mass lesions or lymphadenopathy. 2. Mild diffuse fatty infiltration of the liver. 3. Moderate distention of the bladder. 4. Stable calcified granulomas in the spleen. Electronically Signed   By: Marijo Sanes M.D.   On: 07/01/2015 16:15   Dg Chest Portable 1 View  07/01/2015  CLINICAL DATA:  Chest pain for 3 days. Recent cough and congestion with fever 3 weeks ago. EXAM: PORTABLE  CHEST 1 VIEW COMPARISON:  02/25/2015 and prior chest radiographs dating back to 04/16/2009 FINDINGS: The cardiomediastinal silhouette is unremarkable. Mild peribronchial thickening and right hemidiaphragm elevation again noted. There is no evidence of focal airspace disease, pulmonary edema, suspicious pulmonary nodule/mass, pleural effusion, or pneumothorax. No acute bony abnormalities are identified. IMPRESSION: No evidence of acute cardiopulmonary disease. Mild chronic peribronchial thickening and elevation of the right hemidiaphragm. Electronically Signed   By: Margarette Canada M.D.   On: 07/01/2015 13:28    ECG: NSR  TELEMETRY: NSR  IMPRESSION: 1. Atypical chest pain, likely non  cardiac, low risk ECG/enzymes 2. Known mild CAD by remote cath and multiple coronary risk factors. Coronary calcification incidentally seen on CT abdomen, low thoracic slices. 3. Lethargy/confusion: sedatives? UTI related?  RECOMMENDATION: Lexiscan Myoview in AM if enzymes are normal  Time Spent Directly with Patient: 45 minutes  Sanda Klein, MD, [facility] [phone] office [phone] pager   07/01/2015, 5:33 PM

## 2015-07-01 NOTE — Progress Notes (Signed)
Seen and examined.  Discussed with residents.  We have an agree upon plan.  I will co-sign the H&PE when available.  Briefly, 14 yof admitted with altered mental status.  Has had minor forgetfulness over the past several months.  She is also confused about her medical regimen.  This morning, awoke very confused - per sister, much different than any previous forgetfulness.  Weak all over.  No focal neuro symptoms.  No alcohol.  Does have chronic hep C but does not seem to have progressed to ESLD.  She has multiple minor, not major metabolic abnormalities.  UDS positive for benzos - she has Rx for ativan to help with sleep.  No trauma.  No illicit drugs.  No intentional OD Altered mental status. Unclear etiology.  My best guess is confusion and/or unintentional OD with prescribed meds.  We have ordered all the test we plan for this evening.  We will watch overnight.  She has cleared some from this morning but not yet back to normal per sister.  If my hypothesis is correct, she should continue to clear overnight.

## 2015-07-01 NOTE — ED Notes (Signed)
EKG completed given to EDP.  

## 2015-07-01 NOTE — ED Notes (Signed)
Spoke with CT stated will be perform CT shortly.

## 2015-07-01 NOTE — ED Notes (Signed)
States concerned intermittent confused of time.

## 2015-07-01 NOTE — H&P (Signed)
[facility] Admission History and Physical Service Pager: [phone]  Patient name: [name] [name] Medical record number: [id number] Date of birth: 1951/04/09 Age: 64 y.o. Gender: female  Primary Care Provider: Vicenta Aly, [place] Consultants: Neurology, Cardiology  Code Status: Full (Discussed in full detail w/ patient and sister)  Chief Complaint:  AMS and chest pain   Assessment and Plan: [name] [name] is a 64 y.o. female presenting with altered mental status. PMH is significant for DM, HLD, CAD, hep C, OSA on CPAP, gastroparesis, HTN, HFpEF (60-65%), Acute pulmonary embolism (2013), TIA  (left hemiparesis 2015 ), internal hemorrhoids, chronic respiratory failure.  Altered Mental Status: Patient confused and altered per family earlier today not able to remember son's and grandsons name. Neurologic exam intact without any deficiency found. Patient with hx of chronic hepatitis C and fatty liver disease, however patient's ammonia level was 30, making hepatic encephalopathy unlikely. Electrolytes wnls. Patient with chronic respiratory failure on 2 Liters of oxygen, ABGs were wnl, making respiratory depression unlikely. Patient stable on 2 Liters of oxygen. On admission patient's vitals were stable, afebrile, blood pressure normotensive. UA was positive for leukocytosis, however had multiple squamous cells seen, patient with compliant of dysuria. Therefore, possible that patient was altered due to UTI. WBC wnl. Patient altered mental status could be due to medications (vistaril, ativan, trazodone, solifeacin). UDS was negative, except for benzos. Furthermore. patient has significant hx of TIA (2015), T2DM, HLD, HTN. CT negative for any acute intercranial process. Will need to consider ruling out ischemic stroke. Will consider other metabolic abnormalities such myxedema crisis. - Admit to Telemetry attending Dr. Andria Frames  - Consult Neurology  - Neuro Checks every 2 hours -  Will consider getting MRI, MRA, carotid dopplers pending neurology   - Will check TSH.  - Check Lipid panel/A1c/INR - Holding sedating home meds - Continue ASA and statin - NPO until swallow screen - PT/OT to see  Chest Pain: Heart score of 4. Significant hx includes stroke, HTN, HLD, Angina, and HTN. On admission EKG negative for ST elevation. Troponin negative x 1. Chest pain is mid-sternal, intermittent in nature, not improved with rest, possibly exacerbated by exertion, however patient states that the pain is positional, worse on her left side, however not reproducible on exam.  - Will consult Cardiology  - Will get troponin x 3 q6hrs  - Will get AM EKG  - Will get risk stratification labs ( lipids and HA1C)  - Will get ECHO   Dysuria: UA positive for leukocytes and squamous cells. WBC wnl  - Will get IN and Out Cath for UA  - S/p ceftriaxone in the ED, will consider treatment tomorrow   Blood in Stool: Patient with hx of black tarry stools about a week ago, frank blood in stools today. Epigastric pain worse after meals >> cannot r/o duodenal ulcer.  Patient with hx of internal hemorrhoids. hgb 10.8 . Fecal occult  Blood negative.  -  Will continue to monitor CBC  -  Abdominal CT negative for any acute finding, stable calcified granuloma on spleen    Anemia:  Admit hgb 10. Baseline Hgb 12. - Will get AM CBC   - Will get Iron labs   T2DM: HA1C 7.5  Home Metformin, Javuna, and Amaryl. Holding these for now.   - Continue SSI, sensitive  - CBGs  ACQHS   HFpEF: 10/2013 EF 34-19%, diastolic dysfunction grade 1. BNP 39.5  - Will continue monitor  - Continue Bisoprolol  Chronic Hepatitis C: AST/ALT 112/73  - Will get INR/PT to check hepatic function   HTN:  BP 118/80.  - Holding lisinopril due low blood pressures  CAD: cath 2005: LAD 40%; b. Myoview 3/12: EF 70%, no ischemia; c. Myoview 9/13: EF 76%, no ischemia. Carotid Dopplers 10/10: RICA 40-59%.  - Per cardiology on 12/2014  not concerned for agnina   FEN/GI: PPI, NPO  Prophylaxis: Lovenox   Disposition: Home   History of Present Illness:  [name] [name] is a 64 y.o. female presenting with altered mental status. She states that  was feeling terrible overnight. This morning patient was confused, she could not remember the name of her son or grandson. She  was not very arousable, seemed confused and sleepy per sister. She states that she had been having diarrhea, just this morning had some blood in diarrhea. She indicates that she has had some black tarry tools in the past week.  She also indicates that  has been nauseated, has had stomach pain for about 3 -4 months. Pain is worse after meals. She states that this is different from her previous hx of gastroparesis. Patient's grandson with flu like symptoms. No headaches or vision changes. No falls that were noted. Patient also has a complaint of chest pain, which she has had for several months. She states that the pain is intermittently. States that the pain is mid-sternal. She states that is worse when she gets up and walks around, not better with rest. She does indicate that it is worse   Review Of Systems: Per HPI with the following additions:  Otherwise the remainder of the systems were negative.  Patient Active Problem List   Diagnosis Date Noted  . Viral syndrome 01/11/2015  . Chronic hypoxemic respiratory failure ([place]) 08/04/2014  . Dependence on supplemental oxygen 08/04/2014  . Internal hemorrhoids 06/04/2014  . Allergic rhinitis 01/22/2014  . Diabetes mellitus, type 2 ([place]) 12/17/2013  . Personal history of transient ischemic attack (TIA) and cerebral infarction without residual deficit 11/20/2013  . Anemia 11/19/2013  . Chronic respiratory failure ([place]) 11/13/2013  . AKI (acute kidney injury) ([place]) 11/12/2013  . Dehydration 11/12/2013  . CVA (cerebral infarction) 11/11/2013  . Hemiparesis ([place]) 11/11/2013  . Problem situation relating to social and  personal history 09/18/2013  . Obstructive sleep apnea 05/15/2013  . Herpes zona 02/20/2013  . Polypharmacy 07/02/2012  . Acute pulmonary embolism ([place]) 06/21/2012  . Cellulitis of left leg 06/21/2012  . Arthritis of knee, degenerative 06/18/2012  . Osteoarthritis of left knee 06/14/2012  . Torn medial meniscus 06/14/2012  . Tear of medial meniscus of knee joint 06/14/2012  . Fatty liver disease, nonalcoholic 28/78/6767  . Obstructive apnea 09/08/2011  . HCV antibody positive 09/05/2011  . Back pain, chronic 06/27/2011  . Depression, major, in remission ([place]) 06/27/2011  . Cardiac failure ([place]) 06/27/2011  . HLD (hyperlipidemia) 06/27/2011  . BP (high blood pressure) 06/27/2011  . Adaptive colitis 06/27/2011  . Headache, migraine 06/27/2011  . Absence of bladder continence 06/27/2011  . Edema 03/13/2011  . FATIGUE 11/08/2010  . PALPITATIONS 11/08/2010  . Malaise and fatigue 11/08/2010  . MIXED HYPERLIPIDEMIA 11/18/2009  . Essential hypertension, benign 11/18/2009  . Benign hypertension 11/18/2009  . CAD, NATIVE VESSEL 11/08/2009  . CAROTID ARTERY STENOSIS, WITHOUT INFARCTION 11/08/2009  . DIABETES MELLITUS 10/07/2009  . ABDOMINAL PAIN RIGHT UPPER QUADRANT 10/07/2009  . HYPERCHOLESTEROLEMIA 07/29/2009  . HYPERURICEMIA 07/29/2009  . DYSPNEA ON EXERTION 03/29/2009  . Breath shortness 03/29/2009  .  GASTRIC POLYP 11/18/2008  . DYSLIPIDEMIA 11/18/2008  . DIZZINESS 11/18/2008  . CANDIDIASIS, ORAL 08/25/2008  . Diarrhea 07/27/2008  . POLYURIA 07/27/2008  . Chronic hepatitis C virus infection ([place]) 10/02/2007  . Esophageal reflux 10/02/2007  . Gastroparesis 10/02/2007  . CHEST PAIN UNSPECIFIED 10/02/2007  . DYSPHAGIA UNSPECIFIED 10/02/2007  . Can't get food down 10/02/2007    Past Medical History: Past Medical History  Diagnosis Date  . Hyperlipidemia   . Benign neoplasm of stomach   . Dizziness and giddiness   . Gastroparesis   . Esophageal reflux   . CAD (coronary  artery disease)     a.  cath 2005: LAD 40%;  b. Myoview 3/12: EF 70%, no ischemia;  c. Myoview 9/13: EF 76%, no ischemia  . HTN (hypertension)   . Palpitations   . Depression   . Aortic stenosis     Echo 8/13: EF 55-60%, Mild LVH, mild AS, mean gradient 12 mmHg,   . CHF (congestive heart failure) ([place]) 06-13-12    episode after Cancer surgery  . PONV (postoperative nausea and vomiting)   . DM2 (diabetes mellitus, type 2) ([place]) 06-13-12    NIDDM  . Ovarian cancer ([place]) 06-13-12    surgery only x2-no recent problems  . Chronic hepatitis C without mention of hepatic coma 06-13-12    "states is active"  . Sleep apnea 06-13-12    does not use cpap, cpap setting of 2 when used cpap    Past Surgical History: Past Surgical History  Procedure Laterality Date  . Tubal ligation    . Abdominal hysterectomy    . Oophorectomy      1 ovary removed, then later last ovary removed  . Tonsillectomy  age 76  . Bladder suspension  yrs ago    with posterior repair  . Knee arthroscopy      left  . Cataract extraction Bilateral 03/2013  . Breast biopsy  1972    left  . Abdominal hysterectomy  yrs ago  . Cholecystectomy  5 yrs ago  . Colonoscopy N/A 08/11/2013    Procedure: COLONOSCOPY;  Surgeon: Inda Castle, MD;  Location: [facility] ENDOSCOPY;  Service: Endoscopy;  Laterality: N/A;    Social History: Social History  Substance Use Topics  . Smoking status: Never Smoker   . Smokeless tobacco: Never Used  . Alcohol Use: No     Comment: past hx. , no current use in 11 yrs.   Additional social history:  Please also refer to relevant sections of EMR.  Family History: Family History  Problem Relation Age of Onset  . Brain cancer Father     spine cancer  . Heart disease Mother   . Breast cancer Sister   . Kidney disease Sister   . Diabetes Brother   . Esophageal cancer Brother   . Diabetes Maternal Aunt   . Diabetes Paternal Aunt   . Stomach cancer Paternal Grandfather     Allergies and  Medications: Allergies  Allergen Reactions  . Sunflower Oil Anaphylaxis  . Iodine Nausea And Vomiting    Fish causes n/v  . Metoclopramide Hcl Other (See Comments)    Nerves tense up, panic attack  . Sulfamethoxazole Nausea And Vomiting   No current facility-administered medications on file prior to encounter.   Current Outpatient Prescriptions on File Prior to Encounter  Medication Sig Dispense Refill  . aspirin 325 MG tablet Take 1 tablet (325 mg total) by mouth daily.    [name] atorvastatin (LIPITOR)  10 MG tablet Take 1 tablet (10 mg total) by mouth daily. 90 tablet 3  . bisoprolol (ZEBETA) 5 MG tablet TAKE ONE TABLET BY MOUTH ONCE DAILY 30 tablet 4  . DULoxetine (CYMBALTA) 30 MG capsule Take 30 mg by mouth 2 (two) times daily.     [name] gabapentin (NEURONTIN) 300 MG capsule Take 600 mg by mouth 3 (three) times daily.     [name] gemfibrozil (LOPID) 600 MG tablet Take 600 mg by mouth 2 (two) times daily before a meal.    . glimepiride (AMARYL) 4 MG tablet Take 8 mg by mouth daily with breakfast.     . hydrOXYzine (VISTARIL) 25 MG capsule Take 25 mg by mouth at bedtime.     [name] JANUVIA 100 MG tablet Take 100 mg by mouth daily.     [name] lisinopril (PRINIVIL,ZESTRIL) 20 MG tablet Take 20 mg by mouth daily.    [name] LORazepam (ATIVAN) 1 MG tablet Take 1 mg by mouth at bedtime.     . metFORMIN (GLUCOPHAGE) 1000 MG tablet Take 1,000 mg by mouth 2 (two) times daily.    . OXYGEN-HELIUM IN 2-3 L by Other route continuous.     . solifenacin (VESICARE) 5 MG tablet Take 5 mg by mouth daily.     . traZODone (DESYREL) 50 MG tablet Take 50 mg by mouth at bedtime as needed for sleep.       Objective: BP 117/53 mmHg  Pulse 93  Temp(Src) 98.8 F (37.1 C) (Oral)  Resp 18  SpO2 96% Exam: General: Patient lying in bed, sister next to her Eyes: Pupils Equal Round Reactive to light Conjunctiva without redness or discharge ENTM: no lesions, mucous membranes are moist, no decaying teeth  Neck: No cervical  lymphadenopathy  Cardiovascular: RRR, no murmurs, rubs, gallops, radial pulses 2+ Respiratory: CTAB, no wheezes, no rhonchi  Abdomen: BS+ , no ttp, no rebound  MSK: No lower extremity edema  Skin: no sores or suspicious lesions or rashes or color changes Neuro:  Cn 2-7 intact, Strength equal & normal in upper & lower extremities, sensation is normal throughout,  DTRs +1 bilateral Psych: Patient is able to answer questions appropriately, alert and orientated x 3    Labs and Imaging: CBC BMET   Recent Labs Lab 07/01/15 1030  WBC 7.6  HGB 10.8*  HCT 33.7*  PLT 172    Recent Labs Lab 07/01/15 1030  NA 136  K 4.5  CL 107  CO2 21*  BUN 12  CREATININE 1.07*  GLUCOSE 243*  CALCIUM 8.2*      Results for [name] [name], [name] [name] (MRN [id number]) as of 07/01/2015 14:55  Ref. Range 07/01/2015 10:30  B Natriuretic Peptide Latest Ref Range: 0.0-100.0 pg/mL 39.5  Troponin I Latest Ref Range: <0.031 ng/mL <0.03     Results for [name] [name], [name] [name] (MRN [id number]) as of 07/01/2015 14:55  Ref. Range 07/01/2015 10:50  Lactic Acid, Venous Latest Ref Range: 0.5-2.0 mmol/L 1.85   ABG    Component Value Date/Time   PHART 7.340* 07/01/2015 1209   PCO2ART 41.7 07/01/2015 1209   PO2ART 79.0* 07/01/2015 1209   HCO3 22.5 07/01/2015 1209   TCO2 24 07/01/2015 1209   ACIDBASEDEF 3.0* 07/01/2015 1209   O2SAT 95.0 07/01/2015 1209   Results for [name] [name], [name] [name] (MRN [id number]) as of 07/01/2015 14:55  Ref. Range 58/12/2776 24:23  Salicylate Lvl Latest Ref Range: 2.8-30.0 mg/dL <4.0  Acetaminophen Latest Ref Range: 10-30 ug/mL <10 (L)    Results for  [name] [name], [name] [name] (MRN [id number]) as of 07/01/2015 14:55  Ref. Range 07/01/2015 10:30  Alcohol, Ethyl (B) Latest Ref Range: <5 mg/dL <5    Ct Head Wo Contrast  07/01/2015  CLINICAL DATA:  Chest pain, hypotension, altered mental status EXAM: CT HEAD WITHOUT CONTRAST TECHNIQUE: Contiguous axial images were obtained from the base of the skull through the vertex  without intravenous contrast. COMPARISON:  11/12/2013 FINDINGS: No skull fracture is noted. Paranasal sinuses and mastoid air cells are unremarkable. No intracranial hemorrhage, mass effect or midline shift. No acute cortical infarction. No mass lesion is noted on this unenhanced scan. IMPRESSION: No acute intracranial abnormality. Electronically Signed   By: Lahoma Crocker M.D.   On: 07/01/2015 11:44   Dg Chest Portable 1 View  07/01/2015  CLINICAL DATA:  Chest pain for 3 days. Recent cough and congestion with fever 3 weeks ago. EXAM: PORTABLE CHEST 1 VIEW COMPARISON:  02/25/2015 and prior chest radiographs dating back to 04/16/2009 FINDINGS: The cardiomediastinal silhouette is unremarkable. Mild peribronchial thickening and right hemidiaphragm elevation again noted. There is no evidence of focal airspace disease, pulmonary edema, suspicious pulmonary nodule/mass, pleural effusion, or pneumothorax. No acute bony abnormalities are identified. IMPRESSION: No evidence of acute cardiopulmonary disease. Mild chronic peribronchial thickening and elevation of the right hemidiaphragm. Electronically Signed   By: Margarette Canada M.D.   On: 07/01/2015 13:28    Asiyah Cletis Media, MD 07/01/2015, 2:56 PM PGY-1, [facility] pager: [phone], text pages welcome   Upper Level Addendum:  I have seen and evaluated this patient along with Dr. Emmaline Life and reviewed the above note, making necessary revisions in red.   Elberta Leatherwood, MD,MS,  PGY2 07/01/2015 7:49 PM

## 2015-07-01 NOTE — ED Notes (Signed)
[facility] from PCP for AMS, was at PCP for CP X 2-3 weeks, was difficult to arouse, EMS reported pt to be hypotensive, gave 1062ml NS - BP stable on arrival, also gave 2mg  Narcan with reported improvement in mental status and RR, pt easily arousable on arrival, VSS, CBG 245

## 2015-07-01 NOTE — ED Provider Notes (Signed)
CSN: [id number]     Arrival date & time 07/01/15  1000 History   First MD Initiated Contact with Patient 07/01/15 1025     Chief Complaint  Patient presents with  . Altered Mental Status     (Consider location/radiation/quality/duration/timing/severity/associated sxs/prior Treatment) HPI Comments: Level V caveat for altered mental status. Patient presents from PCPs office where she was complaining of chest pain has been intermittent for the past several weeks. They found her to be difficult to arouse and hypotensive. She is given IV fluids and Narcan with some improvement. Patient arousable to voice and oriented 2 on arrival. She states she was having a normal doctor's visit and was told they could not take care of her. Results report of black tarry stools for the past several weeks. She denies any nausea or vomiting. No fever. She denies any chest pain currently. She cannot describe it very well and comes and goes lasting for seconds to minutes at a time. Denies any excessive use of pain medication. Patient with history of ovarian cancer, hepatitis C, diabetes, hypertension  The history is provided by the patient and the EMS personnel. The history is limited by the condition of the patient.    Past Medical History  Diagnosis Date  . Hyperlipidemia   . Benign neoplasm of stomach   . Dizziness and giddiness   . Gastroparesis   . Esophageal reflux   . CAD (coronary artery disease)     a.  cath 2005: LAD 40%;  b. Myoview 3/12: EF 70%, no ischemia;  c. Myoview 9/13: EF 76%, no ischemia  . HTN (hypertension)   . Palpitations   . Depression   . Aortic stenosis     Echo 8/13: EF 55-60%, Mild LVH, mild AS, mean gradient 12 mmHg,   . CHF (congestive heart failure) ([place]) 06-13-12    episode after Cancer surgery  . PONV (postoperative nausea and vomiting)   . DM2 (diabetes mellitus, type 2) ([place]) 06-13-12    NIDDM  . Ovarian cancer ([place]) 06-13-12    surgery only x2-no recent problems  .  Chronic hepatitis C without mention of hepatic coma 06-13-12    "states is active"  . Sleep apnea 06-13-12    does not use cpap, cpap setting of 2 when used cpap   Past Surgical History  Procedure Laterality Date  . Tubal ligation    . Abdominal hysterectomy    . Oophorectomy      1 ovary removed, then later last ovary removed  . Tonsillectomy  age 55  . Bladder suspension  yrs ago    with posterior repair  . Knee arthroscopy      left  . Cataract extraction Bilateral 03/2013  . Breast biopsy  1972    left  . Abdominal hysterectomy  yrs ago  . Cholecystectomy  5 yrs ago  . Colonoscopy N/A 08/11/2013    Procedure: COLONOSCOPY;  Surgeon: Inda Castle, MD;  Location: [facility];  Service: Endoscopy;  Laterality: N/A;   Family History  Problem Relation Age of Onset  . Brain cancer Father     spine cancer  . Heart disease Mother   . Breast cancer Sister   . Kidney disease Sister   . Diabetes Brother   . Esophageal cancer Brother   . Diabetes Maternal Aunt   . Diabetes Paternal Aunt   . Stomach cancer Paternal Grandfather    Social History  Substance Use Topics  . Smoking status: Never Smoker   .  Smokeless tobacco: Never Used  . Alcohol Use: No     Comment: past hx. , no current use in 11 yrs.   OB History    No data available     Review of Systems  Unable to perform ROS: Mental status change      Allergies  Sunflower oil; Iodine; Metoclopramide hcl; and Sulfamethoxazole  Home Medications   Prior to Admission medications   Medication Sig Start Date End Date Taking? Authorizing Provider  aspirin 325 MG tablet Take 1 tablet (325 mg total) by mouth daily. 11/13/13  Yes Orson Eva, MD  atorvastatin (LIPITOR) 10 MG tablet Take 1 tablet (10 mg total) by mouth daily. 06/04/15  Yes Fay Records, MD  bisoprolol (ZEBETA) 5 MG tablet TAKE ONE TABLET BY MOUTH ONCE DAILY 06/01/15  Yes Fay Records, MD  DULoxetine (CYMBALTA) 30 MG capsule Take 30 mg by mouth 2 (two) times  daily.  06/02/15  Yes Historical Provider, MD  gabapentin (NEURONTIN) 300 MG capsule Take 600 mg by mouth 3 (three) times daily.    Yes Historical Provider, MD  gemfibrozil (LOPID) 600 MG tablet Take 600 mg by mouth 2 (two) times daily before a meal.   Yes Historical Provider, MD  glimepiride (AMARYL) 4 MG tablet Take 8 mg by mouth daily with breakfast.  06/21/13  Yes Historical Provider, MD  hydrOXYzine (VISTARIL) 25 MG capsule Take 25 mg by mouth at bedtime.  01/15/15  Yes Historical Provider, MD  JANUVIA 100 MG tablet Take 100 mg by mouth daily.  06/02/15  Yes Historical Provider, MD  lisinopril (PRINIVIL,ZESTRIL) 20 MG tablet Take 20 mg by mouth daily.   Yes Historical Provider, MD  LORazepam (ATIVAN) 1 MG tablet Take 1 mg by mouth at bedtime.  06/02/15  Yes Historical Provider, MD  metFORMIN (GLUCOPHAGE) 1000 MG tablet Take 1,000 mg by mouth 2 (two) times daily. 01/07/15  Yes Historical Provider, MD  OXYGEN-HELIUM IN 2-3 L by Other route continuous.    Yes Historical Provider, MD  pantoprazole (PROTONIX) 40 MG tablet Take 40 mg by mouth 2 (two) times daily before a meal.   Yes Historical Provider, MD  solifenacin (VESICARE) 5 MG tablet Take 5 mg by mouth daily.    Yes Historical Provider, MD  traZODone (DESYREL) 50 MG tablet Take 50 mg by mouth at bedtime as needed for sleep.  05/07/14  Yes Historical Provider, MD   BP 110/62 mmHg  Pulse 86  Temp(Src) 98.8 F (37.1 C) (Oral)  Resp 18  SpO2 97% Physical Exam  Constitutional: She is oriented to person, place, and time. She appears well-developed and well-nourished. No distress.  Sleepy, arousable to voice, protecting airway.  HENT:  Head: Normocephalic and atraumatic.  Mouth/Throat: Oropharynx is clear and moist. No oropharyngeal exudate.  Eyes: Conjunctivae and EOM are normal. Pupils are equal, round, and reactive to light.  Neck: Normal range of motion. Neck supple.  Cardiovascular: Normal rate, regular rhythm and normal heart sounds.    Pulmonary/Chest: Effort normal and breath sounds normal. No respiratory distress. She exhibits no tenderness.  Abdominal: Soft. There is tenderness. There is no rebound and no guarding.  Genitourinary:  Chaperone present, no external hemorrhoids, no gross blood in  Musculoskeletal: Normal range of motion. She exhibits no edema or tenderness.  Neurological: She is alert and oriented to person, place, and time. No cranial nerve deficit. She exhibits normal muscle tone. Coordination normal.  CN 2-12 intact.  5/5 strength throughout. No ataxia finger to  nose. No asterixis  Skin: Skin is warm.    ED Course  Procedures (including critical care time) Labs Review Labs Reviewed  COMPREHENSIVE METABOLIC PANEL - Abnormal; Notable for the following:    CO2 21 (*)    Glucose, Bld 243 (*)    Creatinine, Ser 1.07 (*)    Calcium 8.2 (*)    Albumin 3.0 (*)    AST 112 (*)    ALT 73 (*)    GFR calc non Af Amer 54 (*)    All other components within normal limits  CBC - Abnormal; Notable for the following:    RBC 3.76 (*)    Hemoglobin 10.8 (*)    HCT 33.7 (*)    All other components within normal limits  ACETAMINOPHEN LEVEL - Abnormal; Notable for the following:    Acetaminophen (Tylenol), Serum <10 (*)    All other components within normal limits  URINALYSIS, ROUTINE W REFLEX MICROSCOPIC (NOT AT [facility]) - Abnormal; Notable for the following:    APPearance CLOUDY (*)    Leukocytes, UA MODERATE (*)    All other components within normal limits  URINE RAPID DRUG SCREEN, HOSP PERFORMED - Abnormal; Notable for the following:    Benzodiazepines POSITIVE (*)    All other components within normal limits  URINE MICROSCOPIC-ADD ON - Abnormal; Notable for the following:    Squamous Epithelial / LPF MANY (*)    Bacteria, UA MANY (*)    All other components within normal limits  I-STAT ARTERIAL BLOOD GAS, ED - Abnormal; Notable for the following:    pH, Arterial 7.340 (*)    pO2, Arterial 79.0 (*)     Acid-base deficit 3.0 (*)    All other components within normal limits  ETHANOL  SALICYLATE LEVEL  TROPONIN I  BRAIN NATRIURETIC PEPTIDE  AMMONIA  POC OCCULT BLOOD, ED  I-STAT TROPOININ, ED  I-STAT CG4 LACTIC ACID, ED  I-STAT CG4 LACTIC ACID, ED  I-STAT CG4 LACTIC ACID, ED  I-STAT CG4 LACTIC ACID, ED  TYPE AND SCREEN  ABO/RH    Imaging Review Ct Head Wo Contrast  07/01/2015  CLINICAL DATA:  Chest pain, hypotension, altered mental status EXAM: CT HEAD WITHOUT CONTRAST TECHNIQUE: Contiguous axial images were obtained from the base of the skull through the vertex without intravenous contrast. COMPARISON:  11/12/2013 FINDINGS: No skull fracture is noted. Paranasal sinuses and mastoid air cells are unremarkable. No intracranial hemorrhage, mass effect or midline shift. No acute cortical infarction. No mass lesion is noted on this unenhanced scan. IMPRESSION: No acute intracranial abnormality. Electronically Signed   By: Lahoma Crocker M.D.   On: 07/01/2015 11:44   Ct Abdomen Pelvis W Contrast  07/01/2015  CLINICAL DATA:  Nausea, vomiting abdominal pain for 2 days. History of hepatitis-C. EXAM: CT ABDOMEN AND PELVIS WITH CONTRAST TECHNIQUE: Multidetector CT imaging of the abdomen and pelvis was performed using the standard protocol following bolus administration of intravenous contrast. CONTRAST:  180mL OMNIPAQUE IOHEXOL 300 MG/ML  SOLN COMPARISON:  02/26/2015 FINDINGS: Lower chest: The lung bases are clear of acute process. Minimal dependent subpleural atelectasis. No pulmonary lesions or pleural effusion. The heart is borderline enlarged. No pericardial effusion. Coronary artery calcifications are noted. The distal esophagus is grossly normal. Hepatobiliary: Mild diffuse fatty infiltration of the liver. No focal hepatic lesions or intrahepatic biliary dilatation. The gallbladder is surgically absent. No common bile duct dilatation. Pancreas: No mass, inflammation or ductal dilatation. Spleen: Within  normal limits in size. Multiple calcified granulomas  are noted. Adrenals/Urinary Tract: The adrenal glands are normal in stable. Small renal cysts but no worrisome renal lesions, renal calculi or hydronephrosis. No obstructing ureteral calculi or bladder calculi. Moderate distention of the bladder is noted. Stomach/Bowel: The stomach, duodenum, small bowel and colon are grossly normal. No inflammatory changes, mass lesions or obstructive findings. The terminal ileum is normal. Vascular/Lymphatic: No mesenteric or retroperitoneal mass or adenopathy. Stable scattered borderline celiac axis and periportal lymph nodes. The aorta is normal in caliber. The branch vessels are patent. Stable atherosclerotic calcifications involving the aorta and branch vessel ostia. Other: The uterus is surgically absent. No pelvic mass or adenopathy. No free pelvic fluid collections. No inguinal mass or adenopathy. Musculoskeletal: No significant bony findings. Stable moderate degenerative changes involving the spine and osteoporosis. IMPRESSION: 1. No acute abdominal/pelvic findings, mass lesions or lymphadenopathy. 2. Mild diffuse fatty infiltration of the liver. 3. Moderate distention of the bladder. 4. Stable calcified granulomas in the spleen. Electronically Signed   By: Marijo Sanes M.D.   On: 07/01/2015 16:15   Dg Chest Portable 1 View  07/01/2015  CLINICAL DATA:  Chest pain for 3 days. Recent cough and congestion with fever 3 weeks ago. EXAM: PORTABLE CHEST 1 VIEW COMPARISON:  02/25/2015 and prior chest radiographs dating back to 04/16/2009 FINDINGS: The cardiomediastinal silhouette is unremarkable. Mild peribronchial thickening and right hemidiaphragm elevation again noted. There is no evidence of focal airspace disease, pulmonary edema, suspicious pulmonary nodule/mass, pleural effusion, or pneumothorax. No acute bony abnormalities are identified. IMPRESSION: No evidence of acute cardiopulmonary disease. Mild chronic  peribronchial thickening and elevation of the right hemidiaphragm. Electronically Signed   By: Margarette Canada M.D.   On: 07/01/2015 13:28   I have personally reviewed and evaluated these images and lab results as part of my medical decision-making.   EKG Interpretation   Date/Time:  Thursday July 01 2015 10:11:15 EDT Ventricular Rate:  91 PR Interval:  126 QRS Duration: 116 QT Interval:  373 QTC Calculation: 459 R Axis:   -14 Text Interpretation:  Sinus rhythm Probable left ventricular hypertrophy  Inferior infarct, old No significant change was found Confirmed by Wyvonnia Dusky   MD, Rogers Ditter [phone]) on 07/01/2015 10:16:11 AM      MDM   Final diagnoses:  Altered mental status, unspecified altered mental status type   Altered mental status from PCPs office with questionable melena as well as chest pain. Oriented x 2, sleepy but arousable.  EKG nsr. CT head negative. Labs with possible UTI. Ammonia normal, doubt hepatic encephalopathy.   Hemoglobin stable. FOBT negative. No evidence of GI bleed.  Rocephin for possible UTI.  Not back to baseline per sister.  Suspect related to medications, possible UTI.  Chest pain description is atypical.  Troponin negative.  Unassigned admission d/w family practice residents.   Ezequiel Essex, MD 07/01/15 [phone]

## 2015-07-01 NOTE — ED Notes (Signed)
Spoke with xray who stated will perform portable xray shortly.

## 2015-07-02 ENCOUNTER — Inpatient Hospital Stay (HOSPITAL_COMMUNITY): Payer: Medicare Other

## 2015-07-02 DIAGNOSIS — R072 Precordial pain: Secondary | ICD-10-CM

## 2015-07-02 DIAGNOSIS — R079 Chest pain, unspecified: Secondary | ICD-10-CM

## 2015-07-02 DIAGNOSIS — R410 Disorientation, unspecified: Secondary | ICD-10-CM

## 2015-07-02 LAB — LIPID PANEL
CHOLESTEROL: 102 mg/dL (ref 0–200)
HDL: 18 mg/dL — ABNORMAL LOW (ref >40)
LDL Cholesterol: 40 mg/dL (ref 0–99)
TRIGLYCERIDES: 218 mg/dL — AB (ref <150)
Total CHOL/HDL Ratio: 5.7 RATIO
VLDL: 44 mg/dL — ABNORMAL HIGH (ref 0–40)

## 2015-07-02 LAB — COMPREHENSIVE METABOLIC PANEL
ALT: 56 U/L — AB (ref 14–54)
AST: 75 U/L — ABNORMAL HIGH (ref 15–41)
Albumin: 2.6 g/dL — ABNORMAL LOW (ref 3.5–5.0)
Alkaline Phosphatase: 61 U/L (ref 38–126)
Anion gap: 7 (ref 5–15)
BUN: 6 mg/dL (ref 6–20)
CHLORIDE: 108 mmol/L (ref 101–111)
CO2: 27 mmol/L (ref 22–32)
CREATININE: 0.75 mg/dL (ref 0.44–1.00)
Calcium: 8.4 mg/dL — ABNORMAL LOW (ref 8.9–10.3)
Glucose, Bld: 156 mg/dL — ABNORMAL HIGH (ref 65–99)
Potassium: 4 mmol/L (ref 3.5–5.1)
Sodium: 142 mmol/L (ref 135–145)
TOTAL PROTEIN: 6.1 g/dL — AB (ref 6.5–8.1)
Total Bilirubin: 0.5 mg/dL (ref 0.3–1.2)

## 2015-07-02 LAB — PROTIME-INR
INR: 1.11 (ref 0.00–1.49)
PROTHROMBIN TIME: 14.5 s (ref 11.6–15.2)

## 2015-07-02 LAB — CBC
HCT: 31.9 % — ABNORMAL LOW (ref 36.0–46.0)
Hemoglobin: 10.3 g/dL — ABNORMAL LOW (ref 12.0–15.0)
MCH: 29.6 pg (ref 26.0–34.0)
MCHC: 32.3 g/dL (ref 30.0–36.0)
MCV: 91.7 fL (ref 78.0–100.0)
PLATELETS: 158 10*3/uL (ref 150–400)
RBC: 3.48 MIL/uL — AB (ref 3.87–5.11)
RDW: 13.8 % (ref 11.5–15.5)
WBC: 4.7 10*3/uL (ref 4.0–10.5)

## 2015-07-02 LAB — URINALYSIS, ROUTINE W REFLEX MICROSCOPIC
Glucose, UA: NEGATIVE mg/dL
Hgb urine dipstick: NEGATIVE
KETONES UR: NEGATIVE mg/dL
NITRITE: NEGATIVE
Protein, ur: NEGATIVE mg/dL
Specific Gravity, Urine: 1.022 (ref 1.005–1.030)
UROBILINOGEN UA: 1 mg/dL (ref 0.0–1.0)
pH: 5.5 (ref 5.0–8.0)

## 2015-07-02 LAB — TROPONIN I: Troponin I: <0.03 ng/mL (ref <0.031)

## 2015-07-02 LAB — URINE MICROSCOPIC-ADD ON

## 2015-07-02 LAB — GLUCOSE, CAPILLARY
GLUCOSE-CAPILLARY: 208 mg/dL — AB (ref 65–99)
GLUCOSE-CAPILLARY: 217 mg/dL — AB (ref 65–99)
Glucose-Capillary: 161 mg/dL — ABNORMAL HIGH (ref 65–99)
Glucose-Capillary: 234 mg/dL — ABNORMAL HIGH (ref 65–99)

## 2015-07-02 LAB — IRON AND TIBC
IRON: 48 ug/dL (ref 28–170)
Saturation Ratios: 19 % (ref 10.4–31.8)
TIBC: 248 ug/dL — AB (ref 250–450)
UIBC: 200 ug/dL

## 2015-07-02 LAB — HEMOGLOBIN A1C
HEMOGLOBIN A1C: 8.5 % — AB (ref 4.8–5.6)
MEAN PLASMA GLUCOSE: 197 mg/dL

## 2015-07-02 LAB — VITAMIN B12: VITAMIN B 12: 499 pg/mL (ref 180–914)

## 2015-07-02 LAB — RPR: RPR Ser Ql: NONREACTIVE

## 2015-07-02 LAB — FERRITIN: FERRITIN: 190 ng/mL (ref 11–307)

## 2015-07-02 LAB — TRANSFERRIN: Transferrin: 177 mg/dL — ABNORMAL LOW (ref 192–382)

## 2015-07-02 LAB — HIV ANTIBODY (ROUTINE TESTING W REFLEX): HIV Screen 4th Generation wRfx: NONREACTIVE

## 2015-07-02 MED ORDER — TRAZODONE HCL 50 MG PO TABS
50.0000 mg | ORAL_TABLET | Freq: Every evening | ORAL | Status: DC | PRN
  Administered 2015-07-02: 50 mg via ORAL
  Filled 2015-07-02: qty 1

## 2015-07-02 MED ORDER — REGADENOSON 0.4 MG/5ML IV SOLN
0.4000 mg | Freq: Once | INTRAVENOUS | Status: DC
  Filled 2015-07-02: qty 5

## 2015-07-02 MED ORDER — KETOROLAC TROMETHAMINE 30 MG/ML IJ SOLN
30.0000 mg | Freq: Once | INTRAMUSCULAR | Status: AC
  Administered 2015-07-02: 30 mg via INTRAVENOUS
  Filled 2015-07-02: qty 1

## 2015-07-02 MED ORDER — CEFTRIAXONE SODIUM 1 G IJ SOLR
1.0000 g | INTRAMUSCULAR | Status: DC
  Administered 2015-07-02: 1 g via INTRAVENOUS
  Filled 2015-07-02 (×2): qty 10

## 2015-07-02 NOTE — Progress Notes (Signed)
Nutrition Brief Note  Patient identified on the Malnutrition Screening Tool (MST) Report  Wt Readings from Last 15 Encounters:  07/01/15 183 lb 10.3 oz (83.3 kg)  06/04/15 182 lb 9.6 oz (82.827 kg)  02/25/15 180 lb 2 oz (81.704 kg)  01/11/15 181 lb (82.101 kg)  01/04/15 184 lb 1.9 oz (83.516 kg)  07/06/14 189 lb (85.73 kg)  06/04/14 186 lb (84.369 kg)  04/08/14 182 lb (82.555 kg)  01/22/14 184 lb (83.462 kg)  11/11/13 193 lb 1.6 oz (87.59 kg)  09/19/13 193 lb 3.2 oz (87.635 kg)  07/10/13 193 lb 2 oz (87.601 kg)  05/15/13 191 lb 12.8 oz (87 kg)  04/22/13 197 lb 3.2 oz (89.449 kg)  04/18/13 192 lb (21.224 kg)   [name] [name] is a 64 y.o. female presenting with altered mental status. PMH is significant for DM, HLD, CAD, hep C, OSA on CPAP, gastroparesis, HTN, HFpEF (60-65%), Acute pulmonary embolism (2013), TIA (left hemiparesis 2015 ), internal hemorrhoids, chronic respiratory failure.  Pt admitted with AMS.   Spoke with pt at bedside; complains of hunger. She reports she consumes 3 meals per day PTA (Breakfast: cereal, Lunch: sandwich, Dinner: spaghetti). Pt and sister whom she lives with share meal preparation responsibilities. She endorses intermittent difficulty swallowing foods and pills. S/p BSE earlier this AM; pt was found to be low aspiration risk and advanced to a carb modified diet.   Pt denies weight loss. Noted wt stability for > 1 year.   Nutrition-Focused physical exam completed. Findings are no fat depletion, no muscle depletion, and no edema.    Body mass index is 29.65 kg/(m^2). Patient meets criteria for overweight based on current BMI.   Current diet order is Carb Modified, patient is consuming approximately n/a% of meals at this time. Labs and medications reviewed.   No nutrition interventions warranted at this time. If nutrition issues arise, please consult RD.   Alisson Rozell A. Jimmye Norman, RD, LDN, CDE Pager: [phone] After hours Pager: [phone]

## 2015-07-02 NOTE — Evaluation (Signed)
Clinical/Bedside Swallow Evaluation Patient Details  Name: JEZREEL SISK MRN: [id number] Date of Birth: Sep 10, 1950  Today's Date: 07/02/2015 Time: SLP Start Time (ACUTE ONLY): 0932 SLP Stop Time (ACUTE ONLY): 0818 SLP Time Calculation (min) (ACUTE ONLY): 7 min  Past Medical History:  Past Medical History  Diagnosis Date  . Hyperlipidemia   . Benign neoplasm of stomach   . Dizziness and giddiness   . Gastroparesis   . Esophageal reflux   . CAD (coronary artery disease)     a.  cath 2005: LAD 40%;  b. Myoview 3/12: EF 70%, no ischemia;  c. Myoview 9/13: EF 76%, no ischemia  . HTN (hypertension)   . Palpitations   . Depression   . Aortic stenosis     Echo 8/13: EF 55-60%, Mild LVH, mild AS, mean gradient 12 mmHg,   . PONV (postoperative nausea and vomiting)   . TIA (transient ischemic attack) 2015  . Pulmonary embolism ([place])   . Carotid stenosis     right, moderate/notes 07/01/2015  . Esophageal stricture   . Family history of adverse reaction to anesthesia     "my sister gets sick"  . CHF (congestive heart failure) ([place]) 06-13-12    episode after Cancer surgery  . COPD (chronic obstructive pulmonary disease) ([place])   . On home oxygen therapy     "2L all the time" (07/11/2015)  . Chronic bronchitis ([place])     "get it ~ q yr"  (07/11/2015)  . Sleep apnea 06-13-12    "used to wear CPAP; can't sleep w/it" (07/01/2015) cpap setting of 2 when used cpap  . Type II diabetes mellitus ([place])   . Anemia   . History of blood transfusion     "when I had my hemorrhoid OR"  . Hepatitis C     "still active" (07/01/2015)  . Daily headache   . Migraine     "~ 5 times/wk" (07/01/2015)  . Arthritis     "qwhere; especially in my back and hands" (07/01/2015)  . Fibromyalgia   . Chronic lower back pain   . Anxiety   . Chronic kidney disease     "my kidneys have been about to shut down" (07/01/2015)  . Ovarian cancer ([place]) 2000; 2002   Past Surgical History:  Past Surgical History  Procedure  Laterality Date  . Tubal ligation    . Oophorectomy      1 ovary removed, then later last ovary removed  . Anterior and posterior vaginal repair    . Knee arthroscopy Left   . Cataract extraction w/ intraocular lens  implant, bilateral Bilateral 03/2013  . Breast biopsy Left 1972  . Colonoscopy N/A 08/11/2013    Procedure: COLONOSCOPY;  Surgeon: Inda Castle, MD;  Location: [facility];  Service: Endoscopy;  Laterality: N/A;  . Esophagogastroduodenoscopy (egd) with esophageal dilation      Archie Endo 07/01/2015  . Appendectomy    . Cholecystectomy open    . Abdominal hysterectomy    . Band hemorrhoidectomy    . Back surgery    . Lumbar disc surgery    . Dilation and curettage of uterus    . Tonsillectomy  ~ 1958   HPI:  64 y.o. female presenting with altered mental status and chest pain. PMH is significant for GERD, ovarian cancer, DM, HLD, CAD, hep C, OSA on CPAP, gastroparesis, HTN, HFpEF (60-65%), Acute pulmonary embolism (2013), TIA (left hemiparesis 2015 ), internal hemorrhoids, chronic respiratory failure. CT no acute intracranial abnormality. CXR  no evidence of acute cardiopulmonary disease.   Assessment / Plan / Recommendation Clinical Impression  Normal oral and pharyngeal swallow across textures. Low aspiration risk. Pt affirms coughing with liquids "sometimes" and reports endoscopy x 3 for esophageal dilation, most recent was two years ago. Recommend regular texture and thin liquids, pills with thin, straws allowed and reflux precautions. No follow up needed for swallow function at present.     Aspiration Risk  Mild    Diet Recommendation Age appropriate regular solids;Thin   Medication Administration: Whole meds with liquid    Other  Recommendations Oral Care Recommendations: Oral care BID   Follow Up Recommendations       Frequency and Duration        Pertinent Vitals/Pain none    SLP Swallow Goals     Swallow Study Prior Functional Status        General Other Pertinent Information: 64 y.o. female presenting with altered mental status and chest pain. PMH is significant for GERD, ovarian cancer, DM, HLD, CAD, hep C, OSA on CPAP, gastroparesis, HTN, HFpEF (60-65%), Acute pulmonary embolism (2013), TIA (left hemiparesis 2015 ), internal hemorrhoids, chronic respiratory failure. CT no acute intracranial abnormality. CXR no evidence of acute cardiopulmonary disease. Type of Study: Bedside swallow evaluation Previous Swallow Assessment:  (none) Diet Prior to this Study: NPO Temperature Spikes Noted: No Respiratory Status: Supplemental O2 delivered via (comment) History of Recent Intubation: No Behavior/Cognition: Alert;Cooperative;Pleasant mood ("groggy") Oral Cavity - Dentition:  (dentures) Self-Feeding Abilities: Able to feed self Patient Positioning: Upright in bed Baseline Vocal Quality: Normal Volitional Cough: Strong Volitional Swallow: Able to elicit    Oral/Motor/Sensory Function Overall Oral Motor/Sensory Function: Appears within functional limits for tasks assessed   Ice Chips Ice chips: Not tested   Thin Liquid Thin Liquid: Within functional limits Presentation: Cup;Straw    Nectar Thick Nectar Thick Liquid: Not tested   Honey Thick Honey Thick Liquid: Not tested   Puree Puree: Within functional limits   Solid   GO    Solid: Within functional limits       Houston Siren 07/02/2015,8:39 AM   Orbie Pyo Colvin Caroli.Ed [employer] [phone]

## 2015-07-02 NOTE — Progress Notes (Signed)
PT Cancellation Note  Patient Details Name: [name] [name] MRN: [id number] DOB: 1951/07/01   Cancelled Treatment:    Reason Eval/Treat Not Completed: Patient at procedure or test/unavailable Pt in room getting ECHO. Will follow up next available time.  Marguarite Arbour A Alejandro Gamel 07/02/2015, 10:13 AM Wray Kearns, PT, DPT [phone]

## 2015-07-02 NOTE — Progress Notes (Signed)
Patient Name: [name] [name] Date of Encounter: 07/02/2015     Active Problems:   Altered mental status   Altered mental state    SUBJECTIVE  Had some intermittent chest pain last night and this AM. None currently .Assocaited with SOB but none now.   CURRENT MEDS . aspirin  325 mg Oral Daily  . atorvastatin  10 mg Oral Daily  . bisoprolol  5 mg Oral Daily  . DULoxetine  30 mg Oral BID  . enoxaparin (LOVENOX) injection  40 mg Subcutaneous Q24H  . insulin aspart  0-9 Units Subcutaneous TID WC  . pantoprazole  40 mg Oral BID AC  . sodium chloride  3 mL Intravenous Q12H    OBJECTIVE  Filed Vitals:   07/02/15 0208 07/02/15 0421 07/02/15 0611 07/02/15 0812  BP: 90/51 106/61 113/52 100/57  Pulse: 80 84 83 80  Temp: 98 F (36.7 C) 98.1 F (36.7 C) 98.1 F (36.7 C) 98.4 F (36.9 C)  TempSrc: Oral Oral Oral Oral  Resp: 20 16 20 16   Height:      Weight:      SpO2: 95% 100% 99% 99%    Intake/Output Summary (Last 24 hours) at 07/02/15 1148 Last data filed at 07/02/15 1037  Gross per 24 hour  Intake 986.67 ml  Output    900 ml  Net  86.67 ml   Filed Weights   07/01/15 1745  Weight: 183 lb 10.3 oz (83.3 kg)    PHYSICAL EXAM  General: Pleasant, NAD. o bese Neuro: Alert and oriented X 3. Moves all extremities spontaneously. Psych: Normal affect. HEENT:  Normal  Neck: Supple without bruits or JVD. Lungs:  Resp regular and unlabored, CTA. Heart: RRR no s3, s4, or 2/6 early peaking aortic ejection murmur  Abdomen: Soft, non-tender, non-distended, BS + x 4.  Extremities: No clubbing, cyanosis or edema. DP/PT/Radials 2+ and equal bilaterally.  Accessory Clinical Findings  CBC  Recent Labs  07/01/15 1030 07/02/15 0638  WBC 7.6 4.7  HGB 10.8* 10.3*  HCT 33.7* 31.9*  MCV 89.6 91.7  PLT 172 858   Basic Metabolic Panel  Recent Labs  07/01/15 1030 07/02/15 0638  NA 136 142  K 4.5 4.0  CL 107 108  CO2 21* 27  GLUCOSE 243* 156*  BUN 12 6  CREATININE  1.07* 0.75  CALCIUM 8.2* 8.4*   Liver Function Tests  Recent Labs  07/01/15 1030 07/02/15 0638  AST 112* 75*  ALT 73* 56*  ALKPHOS 71 61  BILITOT 0.8 0.5  PROT 7.3 6.1*  ALBUMIN 3.0* 2.6*   No results for input(s): LIPASE, AMYLASE in the last 72 hours. Cardiac Enzymes  Recent Labs  07/01/15 1905 07/02/15 0023 07/02/15 0638  TROPONINI <0.03 <0.03 <0.03   BNP Invalid input(s): POCBNP D-Dimer No results for input(s): DDIMER in the last 72 hours. Hemoglobin A1C  Recent Labs  07/01/15 1905  HGBA1C 8.5*   Fasting Lipid Panel  Recent Labs  07/02/15 0638  CHOL 102  HDL 18*  LDLCALC 40  TRIG 218*  CHOLHDL 5.7   Thyroid Function Tests  Recent Labs  07/01/15 1905  TSH 0.413    TELE  NSR  Radiology/Studies  Ct Head Wo Contrast  07/01/2015  CLINICAL DATA:  Chest pain, hypotension, altered mental status EXAM: CT HEAD WITHOUT CONTRAST TECHNIQUE: Contiguous axial images were obtained from the base of the skull through the vertex without intravenous contrast. COMPARISON:  11/12/2013 FINDINGS: No skull fracture is noted. Paranasal  sinuses and mastoid air cells are unremarkable. No intracranial hemorrhage, mass effect or midline shift. No acute cortical infarction. No mass lesion is noted on this unenhanced scan. IMPRESSION: No acute intracranial abnormality. Electronically Signed   By: Lahoma Crocker M.D.   On: 07/01/2015 11:44   Ct Abdomen Pelvis W Contrast  07/01/2015  CLINICAL DATA:  Nausea, vomiting abdominal pain for 2 days. History of hepatitis-C. EXAM: CT ABDOMEN AND PELVIS WITH CONTRAST TECHNIQUE: Multidetector CT imaging of the abdomen and pelvis was performed using the standard protocol following bolus administration of intravenous contrast. CONTRAST:  130mL OMNIPAQUE IOHEXOL 300 MG/ML  SOLN COMPARISON:  02/26/2015 FINDINGS: Lower chest: The lung bases are clear of acute process. Minimal dependent subpleural atelectasis. No pulmonary lesions or pleural effusion.  The heart is borderline enlarged. No pericardial effusion. Coronary artery calcifications are noted. The distal esophagus is grossly normal. Hepatobiliary: Mild diffuse fatty infiltration of the liver. No focal hepatic lesions or intrahepatic biliary dilatation. The gallbladder is surgically absent. No common bile duct dilatation. Pancreas: No mass, inflammation or ductal dilatation. Spleen: Within normal limits in size. Multiple calcified granulomas are noted. Adrenals/Urinary Tract: The adrenal glands are normal in stable. Small renal cysts but no worrisome renal lesions, renal calculi or hydronephrosis. No obstructing ureteral calculi or bladder calculi. Moderate distention of the bladder is noted. Stomach/Bowel: The stomach, duodenum, small bowel and colon are grossly normal. No inflammatory changes, mass lesions or obstructive findings. The terminal ileum is normal. Vascular/Lymphatic: No mesenteric or retroperitoneal mass or adenopathy. Stable scattered borderline celiac axis and periportal lymph nodes. The aorta is normal in caliber. The branch vessels are patent. Stable atherosclerotic calcifications involving the aorta and branch vessel ostia. Other: The uterus is surgically absent. No pelvic mass or adenopathy. No free pelvic fluid collections. No inguinal mass or adenopathy. Musculoskeletal: No significant bony findings. Stable moderate degenerative changes involving the spine and osteoporosis. IMPRESSION: 1. No acute abdominal/pelvic findings, mass lesions or lymphadenopathy. 2. Mild diffuse fatty infiltration of the liver. 3. Moderate distention of the bladder. 4. Stable calcified granulomas in the spleen. Electronically Signed   By: Marijo Sanes M.D.   On: 07/01/2015 16:15   Dg Chest Portable 1 View  07/01/2015  CLINICAL DATA:  Chest pain for 3 days. Recent cough and congestion with fever 3 weeks ago. EXAM: PORTABLE CHEST 1 VIEW COMPARISON:  02/25/2015 and prior chest radiographs dating back to  04/16/2009 FINDINGS: The cardiomediastinal silhouette is unremarkable. Mild peribronchial thickening and right hemidiaphragm elevation again noted. There is no evidence of focal airspace disease, pulmonary edema, suspicious pulmonary nodule/mass, pleural effusion, or pneumothorax. No acute bony abnormalities are identified. IMPRESSION: No evidence of acute cardiopulmonary disease. Mild chronic peribronchial thickening and elevation of the right hemidiaphragm. Electronically Signed   By: Margarette Canada M.D.   On: 07/01/2015 13:28    ASSESSMENT AND PLAN  This is a 64 y.o. female with a past medical history significant for mild CAD by remote angiography (40% LAD 2005, normal nuclear study 2013), HFPEF (moderate LVH, EF 70%), minimal aortic valve stenosis, moderate right carotid stenosis with TIA 2015 (left hemiparesis), DM, history of pulmonary embolism associated with knee surgery, palpitations, GERD and esophageal stricture requiring dilation, depression, untreated OSA (did use CPAP in the past), chronic hepatitis C who was admitted on 07/01/15 with disorientation and dysuria. She also had some chest pain and cardiology was consulted.   Atypical chest pain, likely non cardiac, low risk ECG/enzymes. Troponin neg x3  -- Known mild  CAD by remote cath and multiple coronary risk factors. Coronary calcification incidentally seen on CT abdomen, low thoracic slices. -- 2D ECHO pending -- Stress test was supposed to be done today but order was never placed. Nuclear stress tomorrow AM. NPO after midnight.   Lethargy/confusion: sedatives? UTI related? Seems resolved now.   HFpEF: 10/2013 EF 88-82%, diastolic dysfunction grade 1. BNP 39.5  -- Repeat 2D ECHO pending. No s/s CHF  DM- HgA1c 8.5. Uncontrolled.   Mable Fill R PA-C  Pager [phone]  I have seen and examined the patient along with Angelena Form R PA-C.  I have reviewed the chart, notes and new data.  I agree with PA's note.  Key  new complaints: no real change since yesterday, more alert Key examination changes: no arrhythmia or signs of hypervolemia Key new findings / data: low risk enzymes, normal wall motion on echo  PLAN: Suspicion for coronary etiology of chest symptoms is low. I think Lexiscan Myoview can be performed as an outpatient next week if she is otherwise ready for discharge  Sanda Klein, MD, [place] [phone] 07/02/2015, 1:06 PM

## 2015-07-02 NOTE — Care Management Note (Signed)
Case Management Note  Patient Details  Name: [name] [name] MRN: [id number] Date of Birth: 14-Nov-1950  Subjective/Objective:                  Date-07-02-15 Initial Assessment  Spoke with patient at the bedside Introduced self as case manager and explained role in discharge planning and how to be reached.  Verified patient lives at [facility] with sister and son and grandson.  Verified patient anticipates to go home with family, at time of discharge and will have part-time supervision by family at this time to best of their knowledge.  Patient has DME cane walker  wheelchair oxygen through [facility] . Expressed potential need for no other DME.  Patient denied  needing help with their medication.  Patient drives to MD appointments.  Verified patient has PCP Dr Vicenta Aly. Plan: CM will continue to follow for discharge planning and [facility] resources.   Carles Collet RN BSN CM [phone]   Action/Plan:   Expected Discharge Date:                  Expected Discharge Plan:  Home/Self Care  In-House Referral:     Discharge planning Services  CM Consult  Post Acute Care Choice:    Choice offered to:     DME Arranged:    DME Agency:     HH Arranged:    HH Agency:     Status of Service:  In process, will continue to follow  Medicare Important Message Given:    Date Medicare IM Given:    Medicare IM give by:    Date Additional Medicare IM Given:    Additional Medicare Important Message give by:     If discussed at [place] of Stay Meetings, dates discussed:    Additional Comments:  Carles Collet, RN 07/02/2015, 10:55 AM

## 2015-07-02 NOTE — Progress Notes (Signed)
  Echocardiogram 2D Echocardiogram has been performed.  Jennette Dubin 07/02/2015, 10:35 AM

## 2015-07-02 NOTE — Evaluation (Signed)
Speech Language Pathology Evaluation Patient Details Name: [name] [name] MRN: [id number] DOB: 08/10/1951 Today's Date: 07/02/2015 Time: 8315-1761 SLP Time Calculation (min) (ACUTE ONLY): 10 min  Problem List:  Patient Active Problem List   Diagnosis Date Noted  . Chest pain   . Altered mental state 07/01/2015  . Altered mental status   . Viral syndrome 01/11/2015  . Chronic hypoxemic respiratory failure ([place]) 08/04/2014  . Dependence on supplemental oxygen 08/04/2014  . Internal hemorrhoids 06/04/2014  . Allergic rhinitis 01/22/2014  . Diabetes mellitus, type 2 ([place]) 12/17/2013  . Personal history of transient ischemic attack (TIA) and cerebral infarction without residual deficit 11/20/2013  . Anemia 11/19/2013  . Chronic respiratory failure ([place]) 11/13/2013  . AKI (acute kidney injury) ([place]) 11/12/2013  . Dehydration 11/12/2013  . CVA (cerebral infarction) 11/11/2013  . Hemiparesis ([place]) 11/11/2013  . Problem situation relating to social and personal history 09/18/2013  . Obstructive sleep apnea 05/15/2013  . Herpes zona 02/20/2013  . Polypharmacy 07/02/2012  . Acute pulmonary embolism ([place]) 06/21/2012  . Cellulitis of left leg 06/21/2012  . Arthritis of knee, degenerative 06/18/2012  . Osteoarthritis of left knee 06/14/2012  . Torn medial meniscus 06/14/2012  . Tear of medial meniscus of knee joint 06/14/2012  . Fatty liver disease, nonalcoholic 60/73/7106  . Obstructive apnea 09/08/2011  . HCV antibody positive 09/05/2011  . Back pain, chronic 06/27/2011  . Depression, major, in remission ([place]) 06/27/2011  . Cardiac failure ([place]) 06/27/2011  . HLD (hyperlipidemia) 06/27/2011  . BP (high blood pressure) 06/27/2011  . Adaptive colitis 06/27/2011  . Headache, migraine 06/27/2011  . Absence of bladder continence 06/27/2011  . Edema 03/13/2011  . FATIGUE 11/08/2010  . PALPITATIONS 11/08/2010  . Malaise and fatigue 11/08/2010  . MIXED HYPERLIPIDEMIA 11/18/2009  .  Essential hypertension, benign 11/18/2009  . Benign hypertension 11/18/2009  . CAD, NATIVE VESSEL 11/08/2009  . CAROTID ARTERY STENOSIS, WITHOUT INFARCTION 11/08/2009  . DIABETES MELLITUS 10/07/2009  . ABDOMINAL PAIN RIGHT UPPER QUADRANT 10/07/2009  . HYPERCHOLESTEROLEMIA 07/29/2009  . HYPERURICEMIA 07/29/2009  . DYSPNEA ON EXERTION 03/29/2009  . Breath shortness 03/29/2009  . GASTRIC POLYP 11/18/2008  . DYSLIPIDEMIA 11/18/2008  . DIZZINESS 11/18/2008  . CANDIDIASIS, ORAL 08/25/2008  . Diarrhea 07/27/2008  . POLYURIA 07/27/2008  . Chronic hepatitis C virus infection ([place]) 10/02/2007  . Esophageal reflux 10/02/2007  . Gastroparesis 10/02/2007  . CHEST PAIN UNSPECIFIED 10/02/2007  . DYSPHAGIA UNSPECIFIED 10/02/2007  . Can't get food down 10/02/2007   Past Medical History:  Past Medical History  Diagnosis Date  . Hyperlipidemia   . Benign neoplasm of stomach   . Dizziness and giddiness   . Gastroparesis   . Esophageal reflux   . CAD (coronary artery disease)     a.  cath 2005: LAD 40%;  b. Myoview 3/12: EF 70%, no ischemia;  c. Myoview 9/13: EF 76%, no ischemia  . HTN (hypertension)   . Palpitations   . Depression   . Aortic stenosis     Echo 8/13: EF 55-60%, Mild LVH, mild AS, mean gradient 12 mmHg,   . PONV (postoperative nausea and vomiting)   . TIA (transient ischemic attack) 2015  . Pulmonary embolism ([place])   . Carotid stenosis     right, moderate/notes 07/01/2015  . Esophageal stricture   . Family history of adverse reaction to anesthesia     "my sister gets sick"  . CHF (congestive heart failure) ([place]) 06-13-12    episode after Cancer surgery  .  COPD (chronic obstructive pulmonary disease) ([place])   . On home oxygen therapy     "2L all the time" (07/11/2015)  . Chronic bronchitis ([place])     "get it ~ q yr"  (07/11/2015)  . Sleep apnea 06-13-12    "used to wear CPAP; can't sleep w/it" (07/01/2015) cpap setting of 2 when used cpap  . Type II diabetes mellitus ([place])    . Anemia   . History of blood transfusion     "when I had my hemorrhoid OR"  . Hepatitis C     "still active" (07/01/2015)  . Daily headache   . Migraine     "~ 5 times/wk" (07/01/2015)  . Arthritis     "qwhere; especially in my back and hands" (07/01/2015)  . Fibromyalgia   . Chronic lower back pain   . Anxiety   . Chronic kidney disease     "my kidneys have been about to shut down" (07/01/2015)  . Ovarian cancer ([place]) 2000; 2002   Past Surgical History:  Past Surgical History  Procedure Laterality Date  . Tubal ligation    . Oophorectomy      1 ovary removed, then later last ovary removed  . Anterior and posterior vaginal repair    . Knee arthroscopy Left   . Cataract extraction w/ intraocular lens  implant, bilateral Bilateral 03/2013  . Breast biopsy Left 1972  . Colonoscopy N/A 08/11/2013    Procedure: COLONOSCOPY;  Surgeon: Inda Castle, MD;  Location: [facility] ENDOSCOPY;  Service: Endoscopy;  Laterality: N/A;  . Esophagogastroduodenoscopy (egd) with esophageal dilation      Archie Endo 07/01/2015  . Appendectomy    . Cholecystectomy open    . Abdominal hysterectomy    . Band hemorrhoidectomy    . Back surgery    . Lumbar disc surgery    . Dilation and curettage of uterus    . Tonsillectomy  ~ 1958   HPI:  64 y.o. female presenting with altered mental status and chest pain. PMH is significant for GERD, ovarian cancer, DM, HLD, CAD, hep C, OSA on CPAP, gastroparesis, HTN, HFpEF (60-65%), Acute pulmonary embolism (2013), TIA (left hemiparesis 2015 ), internal hemorrhoids, chronic respiratory failure. CT no acute intracranial abnormality. CXR no evidence of acute cardiopulmonary disease.   Assessment / Plan / Recommendation Clinical Impression  Pt exhibited mild working memory deficits per 4 word recall on Cognistat assessment. Pt with mildly delayed processing and responses. Basic language and cognitve abilities functional, suspect challenges with higher level information and  cognitive demands (pt responsible for paying bills). Pt would benefit from continued treatment for return to independence re: home responsibilities.      SLP Assessment  Patient needs continued Speech Lanaguage Pathology Services    Follow Up Recommendations   (TBD)    Frequency and Duration min 1 x/week  2 weeks   Pertinent Vitals/Pain Pain Assessment: No/denies pain   SLP Goals  Potential to Achieve Goals (ACUTE ONLY): Good  SLP Evaluation Prior Functioning  Cognitive/Linguistic Baseline: Within functional limits Type of Home: House  Lives With: Family (lives w/ sister and son (?)) Available Help at Discharge: Family;Available PRN/intermittently (grandson works, sister works, son not home during day)   Cognition  Overall Cognitive Status: Impaired/Different from baseline Arousal/Alertness: Awake/alert ("groggy") Orientation Level: Oriented X4 (min cues for date) Attention: Sustained Sustained Attention: Appears intact Memory: Impaired Memory Impairment: Retrieval deficit;Storage deficit Awareness: Appears intact Problem Solving: Appears intact Safety/Judgment: Appears intact    Comprehension  Auditory Comprehension Overall Auditory Comprehension: Appears within functional limits for tasks assessed Commands: Within Functional Limits Visual Recognition/Discrimination Discrimination: Not tested Reading Comprehension Reading Status: Within funtional limits    Expression Expression Primary Mode of Expression: Verbal Verbal Expression Overall Verbal Expression: Appears within functional limits for tasks assessed Initiation: No impairment Level of Generative/Spontaneous Verbalization: Conversation Repetition:  (NT) Naming: Not tested Pragmatics: No impairment Written Expression Dominant Hand: Right Written Expression: Not tested   Oral / Motor Oral Motor/Sensory Function Overall Oral Motor/Sensory Function: Appears within functional limits for tasks assessed Motor  Speech Overall Motor Speech: Appears within functional limits for tasks assessed Respiration: Within functional limits Phonation: Normal Resonance: Within functional limits Articulation: Within functional limitis Intelligibility: Intelligible Motor Planning: Witnin functional limits   GO     Houston Siren 07/02/2015, 1:25 PM  Orbie Pyo Fryda Molenda M.Ed [employer] [phone]

## 2015-07-02 NOTE — Progress Notes (Signed)
Patient complains of constant headache 5/10 and requests for pain medications. Patient currently NPO after failing her swallowing screen. Family Medicine Teaching Service MD paged and notified.

## 2015-07-02 NOTE — Consult Note (Signed)
NEURO HOSPITALIST CONSULT NOTE   Referring physician:Hensel   Reason for Consult:AMS  HPI:                                                                                                                                          [name] [name] is an 64 y.o. female who was brought to hospital yesterday due to CP and AMS. Per chart "She states that was feeling terrible overnight. This morning patient was confused, she could not remember the name of her son or grandson. She was not very arousable, seemed confused and sleepy per sister. She states that she had been having diarrhea, just this morning had some blood in diarrhea. She indicates that she has had some black tarry tools in the past week." currently she is alert and having no confusion.  She is able to recall events yesterday, tell me her grandsons name, the floor she is on,presidential candidates, how many quarters are in 1.75$, recall three objects.    Of note, she was Benzo positive on UDS and no Benzo's on her home med list.   Past Medical History  Diagnosis Date  . Hyperlipidemia   . Benign neoplasm of stomach   . Dizziness and giddiness   . Gastroparesis   . Esophageal reflux   . CAD (coronary artery disease)     a.  cath 2005: LAD 40%;  b. Myoview 3/12: EF 70%, no ischemia;  c. Myoview 9/13: EF 76%, no ischemia  . HTN (hypertension)   . Palpitations   . Depression   . Aortic stenosis     Echo 8/13: EF 55-60%, Mild LVH, mild AS, mean gradient 12 mmHg,   . PONV (postoperative nausea and vomiting)   . TIA (transient ischemic attack) 2015  . Pulmonary embolism ([place])   . Carotid stenosis     right, moderate/notes 07/01/2015  . Esophageal stricture   . Family history of adverse reaction to anesthesia     "my sister gets sick"  . CHF (congestive heart failure) ([place]) 06-13-12    episode after Cancer surgery  . COPD (chronic obstructive pulmonary disease) ([place])   . On home oxygen therapy     "2L all the  time" (07/11/2015)  . Chronic bronchitis ([place])     "get it ~ q yr"  (07/11/2015)  . Sleep apnea 06-13-12    "used to wear CPAP; can't sleep w/it" (07/01/2015) cpap setting of 2 when used cpap  . Type II diabetes mellitus ([place])   . Anemia   . History of blood transfusion     "when I had my hemorrhoid OR"  . Hepatitis C     "still active" (07/01/2015)  . Daily headache   . Migraine     "~ 5 times/wk" (07/01/2015)  .  Arthritis     "qwhere; especially in my back and hands" (07/01/2015)  . Fibromyalgia   . Chronic lower back pain   . Anxiety   . Chronic kidney disease     "my kidneys have been about to shut down" (07/01/2015)  . Ovarian cancer ([place]) 2000; 2002    Past Surgical History  Procedure Laterality Date  . Tubal ligation    . Oophorectomy      1 ovary removed, then later last ovary removed  . Anterior and posterior vaginal repair    . Knee arthroscopy Left   . Cataract extraction w/ intraocular lens  implant, bilateral Bilateral 03/2013  . Breast biopsy Left 1972  . Colonoscopy N/A 08/11/2013    Procedure: COLONOSCOPY;  Surgeon: Inda Castle, MD;  Location: [facility];  Service: Endoscopy;  Laterality: N/A;  . Esophagogastroduodenoscopy (egd) with esophageal dilation      Ar[facility] Endo 07/01/2015  . Appendectomy    . Cholecystectomy open    . Abdominal hysterectomy    . Band hemorrhoidectomy    . Back surgery    . Lumbar disc surgery    . Dilation and curettage of uterus    . Tonsillectomy  ~ 1958    Family History  Problem Relation Age of Onset  . Brain cancer Father     spine cancer  . Heart disease Mother   . Breast cancer Sister   . Kidney disease Sister   . Diabetes Brother   . Esophageal cancer Brother   . Diabetes Maternal Aunt   . Diabetes Paternal Aunt   . Stomach cancer Paternal Grandfather      Social History:  reports that she has never smoked. She has never used smokeless tobacco. She reports that she drinks alcohol. She reports that she uses  illicit drugs ("Crack" cocaine and Marijuana).  Allergies  Allergen Reactions  . Sunflower Oil Anaphylaxis  . Iodine Nausea And Vomiting    Fish causes n/v  . Metoclopramide Hcl Other (See Comments)    Nerves tense up, panic attack  . Sulfamethoxazole Nausea And Vomiting    MEDICATIONS:                                                                                                                     Prior to Admission:  Prescriptions prior to admission  Medication Sig Dispense Refill Last Dose  . aspirin 325 MG tablet Take 1 tablet (325 mg total) by mouth daily.   06/30/2015 at Unknown time  . atorvastatin (LIPITOR) 10 MG tablet Take 1 tablet (10 mg total) by mouth daily. 90 tablet 3 06/30/2015 at Unknown time  . bisoprolol (ZEBETA) 5 MG tablet TAKE ONE TABLET BY MOUTH ONCE DAILY 30 tablet 4 06/30/2015 at 0800  . DULoxetine (CYMBALTA) 30 MG capsule Take 30 mg by mouth 2 (two) times daily.    06/30/2015 at Unknown time  . gabapentin (NEURONTIN) 300 MG capsule Take 600 mg by mouth 3 (three) times daily.  06/30/2015 at Unknown time  . gemfibrozil (LOPID) 600 MG tablet Take 600 mg by mouth 2 (two) times daily before a meal.   06/30/2015 at Unknown time  . glimepiride (AMARYL) 4 MG tablet Take 8 mg by mouth daily with breakfast.    06/30/2015 at Unknown time  . hydrOXYzine (VISTARIL) 25 MG capsule Take 25 mg by mouth at bedtime.    06/30/2015 at Unknown time  . JANUVIA 100 MG tablet Take 100 mg by mouth daily.    06/30/2015 at Unknown time  . lisinopril (PRINIVIL,ZESTRIL) 20 MG tablet Take 20 mg by mouth daily.   06/30/2015 at Unknown time  . LORazepam (ATIVAN) 1 MG tablet Take 1 mg by mouth at bedtime.    06/30/2015 at Unknown time  . metFORMIN (GLUCOPHAGE) 1000 MG tablet Take 1,000 mg by mouth 2 (two) times daily.   06/30/2015 at Unknown time  . OXYGEN-HELIUM IN 2-3 L by Other route continuous.    continuous  . pantoprazole (PROTONIX) 40 MG tablet Take 40 mg by mouth 2 (two) times daily before a  meal.   06/30/2015 at Unknown time  . solifenacin (VESICARE) 5 MG tablet Take 5 mg by mouth daily.    06/30/2015 at Unknown time  . traZODone (DESYREL) 50 MG tablet Take 50 mg by mouth at bedtime as needed for sleep.    06/30/2015 at Unknown time   Scheduled: . aspirin  325 mg Oral Daily  . atorvastatin  10 mg Oral Daily  . bisoprolol  5 mg Oral Daily  . DULoxetine  30 mg Oral BID  . enoxaparin (LOVENOX) injection  40 mg Subcutaneous Q24H  . insulin aspart  0-9 Units Subcutaneous TID WC  . pantoprazole  40 mg Oral BID AC  . sodium chloride  3 mL Intravenous Q12H     ROS:                                                                                                                                       History obtained from the patient  General ROS: negative for - chills, fatigue, fever, night sweats, weight gain or weight loss Psychological ROS: negative for - behavioral disorder, hallucinations, memory difficulties, mood swings or suicidal ideation Ophthalmic ROS: negative for - blurry vision, double vision, eye pain or loss of vision ENT ROS: negative for - epistaxis, nasal discharge, oral lesions, sore throat, tinnitus or vertigo Allergy and Immunology ROS: negative for - hives or itchy/watery eyes Hematological and Lymphatic ROS: negative for - bleeding problems, bruising or swollen lymph nodes Endocrine ROS: negative for - galactorrhea, hair pattern changes, polydipsia/polyuria or temperature intolerance Respiratory ROS: negative for - cough, hemoptysis, shortness of breath or wheezing Cardiovascular ROS: negative for - chest pain, dyspnea on exertion, edema or irregular heartbeat Gastrointestinal ROS: negative for - abdominal pain, diarrhea, hematemesis, nausea/vomiting or stool incontinence Genito-Urinary ROS: negative for - dysuria, hematuria, incontinence  or urinary frequency/urgency Musculoskeletal ROS: negative for - joint swelling or muscular weakness Neurological ROS: as  noted in HPI Dermatological ROS: negative for rash and skin lesion changes   Blood pressure 100/57, pulse 80, temperature 98.4 F (36.9 C), temperature source Oral, resp. rate 16, height 5\' 6"  (1.676 m), weight 83.3 kg (183 lb 10.3 oz), SpO2 99 %.   Neurologic Examination:                                                                                                      HEENT-  Normocephalic, no lesions, without obvious abnormality.  Normal external eye and conjunctiva.  Normal TM's bilaterally.  Normal auditory canals and external ears. Normal external nose, mucus membranes and septum.  Normal pharynx. Cardiovascular- S1, S2 normal, pulses palpable throughout   Lungs- chest clear, no wheezing, rales, normal symmetric air entry Abdomen- normal findings: bowel sounds normal Extremities- no edema Lymph-no adenopathy palpable Musculoskeletal-no joint tenderness, deformity or swelling Skin-warm and dry, no hyperpigmentation, vitiligo, or suspicious lesions  Neurological Examination Mental Status: Alert, oriented, thought content appropriate.  Speech fluent without evidence of aphasia.  Able to follow 3 step commands without difficulty. Cranial Nerves: II: Discs flat bilaterally; Visual fields grossly normal, pupils equal, round, reactive to light and accommodation III,IV, VI: ptosis not present, extra-ocular motions intact bilaterally V,VII: smile symmetric, facial light touch sensation normal bilaterally VIII: hearing normal bilaterally IX,X: uvula rises symmetrically XI: bilateral shoulder shrug XII: midline tongue extension Motor: Right : Upper extremity   5/5    Left:     Upper extremity   5/5  Lower extremity   5/5     Lower extremity   5/5 Tone and bulk:normal tone throughout; no atrophy noted Sensory: Pinprick and light touch intact throughout, bilaterally Deep Tendon Reflexes: 2+ and symmetric throughout Plantars: Right: downgoing   Left: downgoing Cerebellar: normal  finger-to-nose, normal heel-to-shin test Gait: not tested      Lab Results: Basic Metabolic Panel:  Recent Labs Lab 07/01/15 1030 07/02/15 0638  NA 136 142  K 4.5 4.0  CL 107 108  CO2 21* 27  GLUCOSE 243* 156*  BUN 12 6  CREATININE 1.07* 0.75  CALCIUM 8.2* 8.4*    Liver Function Tests:  Recent Labs Lab 07/01/15 1030 07/02/15 0638  AST 112* 75*  ALT 73* 56*  ALKPHOS 71 61  BILITOT 0.8 0.5  PROT 7.3 6.1*  ALBUMIN 3.0* 2.6*   No results for input(s): LIPASE, AMYLASE in the last 168 hours.  Recent Labs Lab 07/01/15 1030  AMMONIA 30    CBC:  Recent Labs Lab 07/01/15 1030 07/02/15 0638  WBC 7.6 4.7  HGB 10.8* 10.3*  HCT 33.7* 31.9*  MCV 89.6 91.7  PLT 172 158    Cardiac Enzymes:  Recent Labs Lab 07/01/15 1030 07/01/15 1905 07/02/15 0023 07/02/15 0638  TROPONINI <0.03 <0.03 <0.03 <0.03    Lipid Panel:  Recent Labs Lab 07/02/15 0638  CHOL 102  TRIG 218*  HDL 18*  CHOLHDL 5.7  VLDL 44*  LDLCALC 40    CBG:  Recent Labs Lab 07/01/15 2138 07/02/15 0748  GLUCAP 104* 161*    Microbiology: Results for orders placed or performed during the hospital encounter of 11/11/13  Culture, Urine     Status: None   Collection Time: 11/12/13  4:00 PM  Result Value Ref Range Status   Specimen Description URINE, RANDOM  Final   Special Requests NONE  Final   Culture  Setup Time   Final    11/12/2013 17:14 Performed at [facility]   Final    5,000 COLONIES/ML Performed at [facility]   Final    INSIGNIFICANT GROWTH Performed at [facility]   Report Status 11/13/2013 FINAL  Final    Coagulation Studies:  Recent Labs  07/02/15 0638  LABPROT 14.5  INR 1.11    Imaging: Ct Head Wo Contrast  07/01/2015  CLINICAL DATA:  Chest pain, hypotension, altered mental status EXAM: CT HEAD WITHOUT CONTRAST TECHNIQUE: Contiguous axial images were obtained from the base of the skull through the  vertex without intravenous contrast. COMPARISON:  11/12/2013 FINDINGS: No skull fracture is noted. Paranasal sinuses and mastoid air cells are unremarkable. No intracranial hemorrhage, mass effect or midline shift. No acute cortical infarction. No mass lesion is noted on this unenhanced scan. IMPRESSION: No acute intracranial abnormality. Electronically Signed   By: Lahoma Crocker M.D.   On: 07/01/2015 11:44   Ct Abdomen Pelvis W Contrast  07/01/2015  CLINICAL DATA:  Nausea, vomiting abdominal pain for 2 days. History of hepatitis-C. EXAM: CT ABDOMEN AND PELVIS WITH CONTRAST TECHNIQUE: Multidetector CT imaging of the abdomen and pelvis was performed using the standard protocol following bolus administration of intravenous contrast. CONTRAST:  140mL OMNIPAQUE IOHEXOL 300 MG/ML  SOLN COMPARISON:  02/26/2015 FINDINGS: Lower chest: The lung bases are clear of acute process. Minimal dependent subpleural atelectasis. No pulmonary lesions or pleural effusion. The heart is borderline enlarged. No pericardial effusion. Coronary artery calcifications are noted. The distal esophagus is grossly normal. Hepatobiliary: Mild diffuse fatty infiltration of the liver. No focal hepatic lesions or intrahepatic biliary dilatation. The gallbladder is surgically absent. No common bile duct dilatation. Pancreas: No mass, inflammation or ductal dilatation. Spleen: Within normal limits in size. Multiple calcified granulomas are noted. Adrenals/Urinary Tract: The adrenal glands are normal in stable. Small renal cysts but no worrisome renal lesions, renal calculi or hydronephrosis. No obstructing ureteral calculi or bladder calculi. Moderate distention of the bladder is noted. Stomach/Bowel: The stomach, duodenum, small bowel and colon are grossly normal. No inflammatory changes, mass lesions or obstructive findings. The terminal ileum is normal. Vascular/Lymphatic: No mesenteric or retroperitoneal mass or adenopathy. Stable scattered  borderline celiac axis and periportal lymph nodes. The aorta is normal in caliber. The branch vessels are patent. Stable atherosclerotic calcifications involving the aorta and branch vessel ostia. Other: The uterus is surgically absent. No pelvic mass or adenopathy. No free pelvic fluid collections. No inguinal mass or adenopathy. Musculoskeletal: No significant bony findings. Stable moderate degenerative changes involving the spine and osteoporosis. IMPRESSION: 1. No acute abdominal/pelvic findings, mass lesions or lymphadenopathy. 2. Mild diffuse fatty infiltration of the liver. 3. Moderate distention of the bladder. 4. Stable calcified granulomas in the spleen. Electronically Signed   By: Marijo Sanes M.D.   On: 07/01/2015 16:15   Dg Chest Portable 1 View  07/01/2015  CLINICAL DATA:  Chest pain for 3 days. Recent cough and congestion with fever 3 weeks ago. EXAM: PORTABLE CHEST 1 VIEW  COMPARISON:  02/25/2015 and prior chest radiographs dating back to 04/16/2009 FINDINGS: The cardiomediastinal silhouette is unremarkable. Mild peribronchial thickening and right hemidiaphragm elevation again noted. There is no evidence of focal airspace disease, pulmonary edema, suspicious pulmonary nodule/mass, pleural effusion, or pneumothorax. No acute bony abnormalities are identified. IMPRESSION: No evidence of acute cardiopulmonary disease. Mild chronic peribronchial thickening and elevation of the right hemidiaphragm. Electronically Signed   By: Margarette Canada M.D.   On: 07/01/2015 13:28       Assessment and plan per attending neurologist  Etta Quill PA-C [facility] [phone]  07/02/2015, 11:18 AM   Assessment/Plan:   64 yo F with Transient AMS in the setting of benzodiazepine use and UTI. I susepct that this was a TME secondary to UTI. She has some episodes of confusion, but also has sugars up in the 500s which may be related. She raises some concerns about her memory. A B12 level would be  reasonable.   1) B12, If < 400, then would send MMA if not treating.  2) Please call with any further questions or concerns. Neurology will sign off.   Roland Rack, MD [facility] [phone]  If 7pm- 7am, please page neurology on call as listed in [place].

## 2015-07-02 NOTE — Progress Notes (Signed)
Family Medicine Teaching Service Daily Progress Note Intern Pager: [phone]  Patient name: [name] [name] Medical record number: [id number] Date of birth: 1950/12/02 Age: 64 y.o. Gender: female  Primary Care Provider: Vicenta Aly, FNP Consultants: Neurology, Cardiology  Code Status: FULL  Pt Overview and Major Events to Date:  11/3: admitted for AMS and atypical chest pain  Assessment and Plan: [name] [name] is a 64 y.o. female presenting with altered mental status. PMH is significant for DM, HLD, CAD, hep C, OSA on CPAP, gastroparesis, HTN, HFpEF (60-65%), Acute pulmonary embolism (2013), TIA (left hemiparesis 2015 ), internal hemorrhoids, chronic respiratory failure.  Altered Mental Status: Patient confused and altered per family earlier today not able to remember son's and grandsons name. Neurologic exam intact without any deficiency found. Patient with hx of chronic hepatitis C and fatty liver disease, however patient's ammonia level was 30, making hepatic encephalopathy unlikely. Electrolytes wnls. Patient with chronic respiratory failure on 2 Liters of oxygen, ABGs were wnl, making respiratory depression unlikely. Patient stable on 2 Liters of oxygen. On admission patient's vitals were stable, afebrile, blood pressure normotensive. UA was positive for leukocytosis, however had multiple squamous cells seen, patient with compliant of dysuria. Therefore, possible that patient was altered due to UTI. WBC wnl; lactic acid wnl. Patient altered mental status could be due to medications (vistaril, ativan, trazodone, solifeacin). UDS was negative, except for benzos. Furthermore. patient has significant hx of TIA (2015), T2DM, HLD, HTN. CT negative for any acute intercranial process. Will need to consider ruling out ischemic stroke. TSH 0.413 making myxedema crisis not likely.RPR negative as well.  - Consulted Neurology, appreciate recs - Neuro Checks every 2 hours - Will consider getting MRI,  MRA, carotid dopplers pending neurology consult  - Check Lipid panel/A1c  - HIV antibody pending  - Holding sedating home meds  - Continue ASA and statin  - PT/OT to see  - SLP: regular diet  Chest Pain: Heart score of 4. Significant hx includes stroke, HTN, HLD, Angina, and HTN. On admission EKG negative for ST elevation. Troponin negative x 1. Chest pain is mid-sternal, intermittent in nature, not improved with rest, possibly exacerbated by exertion, however patient states that the pain is positional, worse on her left side, however not reproducible on exam. EKG 11/3: normal sinus, with old inferior infarct.  - troponins: negative x 3 - Lipids: Chol 102, TAG 218, LDL40, HDL 18 - A1c 8.5 - Cardiology consulted, appreciate recs: likely noncardiac, low risk ECG/enzymes; plan for Lexiscan Myoview in AM if enzymes normal - ECHO ordered  Dysuria: UA positive for leukocytes and squamous cells. WBC wnl  - IN and Out Cath for UA and urine culturepending - S/p ceftriaxone in the ED, will consider treatment tomorrow   Blood in Stool: Patient with hx of black tarry stools about a week ago, frank blood in stools today. Epigastric pain worse after meals >> cannot r/o duodenal ulcer. Patient with hx of internal hemorrhoids. hgb 10.8 . Fecal occult Blood negative. Hgb/Hct stable from admission.  - Will continue to monitor CBC  - Abdominal CT negative for any acute finding, stable calcified granuloma on spleen   Anemia: Admit hgb 10. Baseline Hgb 12.  - hgb stable at 10.3 this AM - Will get Iron labs   T2DM: HA1C 7.5> 8.5 Home Metformin, Javuna, and Amaryl. Holding these for now.  - Continue SSI, sensitive  - CBGs ACQHS   HFpEF: 10/2013 EF 36-14%, diastolic dysfunction grade 1. BNP 39.5  -  Will continue monitor  - Continue Bisoprolol  Chronic Hepatitis C: AST/ALT 112/73> 75/56  - INR/PT to check hepatic function   HTN: BP 118/80.  - Holding lisinopril due low blood  pressures  CAD: cath 2005: LAD 40%; b. Myoview 3/12: EF 70%, no ischemia; c. Myoview 9/13: EF 76%, no ischemia. Carotid Dopplers 10/10: RICA 40-59%.  - Per cardiology on 12/2014 not concerned for agnina   FEN/GI: PPI, NPO  Prophylaxis: Lovenox   Disposition: Home  Subjective:  - HA overnight 5/10; given Toradol 30mg  x1 as patient failed swallow study; passed this morning - notes she feels better but still has some confusion.  - notes of chest pressure earlier this morning that lasted 2 mins. No radiation of pressure. No associated symptoms - continues to have dysuria   Objective: Temp:  [98 F (36.7 C)-98.8 F (37.1 C)] 98.4 F (36.9 C) (11/04 0812) Pulse Rate:  [80-94] 80 (11/04 0812) Resp:  [15-26] 16 (11/04 0812) BP: (90-135)/(48-80) 100/57 mmHg (11/04 0812) SpO2:  [95 %-100 %] 99 % (11/04 0812) Weight:  [183 lb 10.3 oz (83.3 kg)] 183 lb 10.3 oz (83.3 kg) (11/03 1745) Physical Exam: General: NAD, alert and oriented x3  HEENT: Atraumatic, normocephalic, neck supple, EOMI, sclera clear  CV: RRR, 2/6 systolic murmur; no rubs, or gallops PULM: CTAB, normal effort ABD: Soft, nontender, nondistended, NABS, no organomegaly SKIN: No rash or cyanosis; warm and well-perfused EXTR: No lower extremity edema or calf tenderness PSYCH: normal rate and volume of speech NEURO: Awake, alert, no focal deficits grossly, normal speech; notes of decreased sensation on right forehead and maxillary areas; notes of "pain" sensation to light touch on right upper and lower extremity; normal strength in upper and lower extremities; finger to nose test normal   Laboratory:  Recent Labs Lab 07/01/15 1030 07/02/15 0638  WBC 7.6 4.7  HGB 10.8* 10.3*  HCT 33.7* 31.9*  PLT 172 158    Recent Labs Lab 07/01/15 1030 07/02/15 0638  NA 136 142  K 4.5 4.0  CL 107 108  CO2 21* 27  BUN 12 6  CREATININE 1.07* 0.75  CALCIUM 8.2* 8.4*  PROT 7.3 6.1*  BILITOT 0.8 0.5  ALKPHOS 71 61  ALT 73* 56*   AST 112* 75*  GLUCOSE 243* 156*   Imaging/Diagnostic Tests: CT head: negativve  Smiley Houseman, MD 07/02/2015, 9:54 AM PGY-1, [place] Intern pager: [phone], text pages welcome

## 2015-07-02 NOTE — Evaluation (Signed)
Physical Therapy Evaluation Patient Details Name: [name] [name] MRN: [id number] DOB: 05-Oct-1950 Today's Date: 07/02/2015   History of Present Illness  Patient is a 64 y/o female presents with AMS and chest pain. UDS positive for benzos. Concern for unintentional OD with prescribed meds. PMH is significant for DM, HLD, CAD, hep C, OSA on CPAP,gastroparesis, HTN, HFpEF (60-65%), Acute PE (2013), TIA (left hemiparesis 2015 ), chronic respiratory failure.  Clinical Impression  Patient presents with generalized weakness and balance deficits impacting safe mobility. Pt reports 5 falls in the last 6 months (most of which were on the steep steps). Encouraged pt to use back steps that have a rail or have son build rail on front steps for safety. Pt pushing IV pole for support during ambulation and tolerated stair training with close Min guard assist for safety. Encouraged use of RW for ambulation esp when feeling weak/fatigued. Will follow acutely to maximize independence and mobility prior to return home. Pt high falls risk.      Follow Up Recommendations Home health PT;Supervision - Intermittent    Equipment Recommendations  None recommended by PT    Recommendations for Other Services       Precautions / Restrictions Precautions Precautions: Fall Precaution Comments: 5 falls in last 6 months. Restrictions Weight Bearing Restrictions: No      Mobility  Bed Mobility               General bed mobility comments: In bathroom upon PT arrival.  Transfers Overall transfer level: Needs assistance Equipment used: None Transfers: Sit to/from Stand Sit to Stand: Min guard         General transfer comment: Min guard for safety. Stood from toilet x1. Transferred to chair post ambulation bout.  Ambulation/Gait Ambulation/Gait assistance: Min guard Ambulation Distance (Feet): 200 Feet Assistive device:  (pushing IV pole.) Gait Pattern/deviations: Step-through pattern;Decreased stride  length   Gait velocity interpretation: <1.8 ft/sec, indicative of risk for recurrent falls General Gait Details: Slow, mildly unsteady gait. Pushing IV pole for support. Declined using RW.  Stairs Stairs: Yes Stairs assistance: Min guard Stair Management: Step to pattern;Forwards;One rail Right Number of Stairs: 4 General stair comments: Cues for technique and safety.   Wheelchair Mobility    Modified Rankin (Stroke Patients Only)       Balance Overall balance assessment: Needs assistance;History of Falls Sitting-balance support: Feet supported;No upper extremity supported Sitting balance-Leahy Scale: Good     Standing balance support: During functional activity Standing balance-Leahy Scale: Fair Standing balance comment: Furniture walking within room. Requires UE support for dynamic standing.                             Pertinent Vitals/Pain Pain Assessment: No/denies pain    Home Living Family/patient expects to be discharged to:: Private residence Living Arrangements: Other relatives;Children Available Help at Discharge: Family;Available PRN/intermittently (grandson works, sister works, son not home during day) Type of Home: House Home Access: Stairs to enter Point Clear: None Technical brewer of Steps: 2 without rails or 3 with rails Home Layout: One [place] Furnace: Environmental consultant - 2 wheels;Cane - single point;Wheelchair - manual;Shower seat      Prior Function Level of Independence: Needs assistance   Gait / Transfers Assistance Needed: No AD but reports multiple falls.   ADL's / Homemaking Assistance Needed: Reports having trouble getting dressed recently. Does bird baths. Does mopping and some housework.  Hand Dominance   Dominant Hand: Right    Extremity/Trunk Assessment   Upper Extremity Assessment: Defer to OT evaluation           Lower Extremity Assessment: Generalized weakness         Communication    Communication: No difficulties  Cognition Arousal/Alertness: Awake/alert Behavior During Therapy: WFL for tasks assessed/performed Overall Cognitive Status: Impaired/Different from baseline Area of Impairment: Safety/judgement;Problem solving;Memory         Safety/Judgement: Decreased awareness of safety;Decreased awareness of deficits   Problem Solving: Slow processing      General Comments General comments (skin integrity, edema, etc.): Discussion with pt re: needing to use DME for ambulation, not safe to be doing mopping or housework at this time due to high falls risk.     Exercises        Assessment/Plan    PT Assessment Patient needs continued PT services  PT Diagnosis Difficulty walking;Generalized weakness   PT Problem List Decreased strength;Decreased activity tolerance;Decreased safety awareness;Decreased balance;Decreased mobility;Decreased cognition  PT Treatment Interventions Balance training;Gait training;Stair training;Functional mobility training;Therapeutic activities;Therapeutic exercise;Patient/family education   PT Goals (Current goals can be found in the Care Plan section) Acute Rehab PT Goals Patient Stated Goal: none stated PT Goal Formulation: With patient Time For Goal Achievement: 07/16/15 Potential to Achieve Goals: Fair    Frequency Min 3X/week   Barriers to discharge Decreased caregiver support Pt home alone during the day and has 2 steps to climb to enter hme without rails.    Co-evaluation               End of Session Equipment Utilized During Treatment: Gait belt Activity Tolerance: Patient tolerated treatment well Patient left: in chair;with call bell/phone within reach (tech coming in room to assist with bath) Nurse Communication: Mobility status         Time: 2423-5361 PT Time Calculation (min) (ACUTE ONLY): 16 min   Charges:   PT Evaluation $Initial PT Evaluation Tier I: 1 Procedure     PT G Codes:        Ryland Smoots  A Velma Hanna 07/02/2015, 11:25 AM Wray Kearns, PT, DPT [phone]

## 2015-07-03 ENCOUNTER — Inpatient Hospital Stay (HOSPITAL_COMMUNITY): Payer: Medicare Other

## 2015-07-03 DIAGNOSIS — E1165 Type 2 diabetes mellitus with hyperglycemia: Secondary | ICD-10-CM

## 2015-07-03 DIAGNOSIS — R079 Chest pain, unspecified: Secondary | ICD-10-CM

## 2015-07-03 DIAGNOSIS — I10 Essential (primary) hypertension: Secondary | ICD-10-CM

## 2015-07-03 DIAGNOSIS — I25118 Atherosclerotic heart disease of native coronary artery with other forms of angina pectoris: Secondary | ICD-10-CM

## 2015-07-03 DIAGNOSIS — R0789 Other chest pain: Secondary | ICD-10-CM

## 2015-07-03 DIAGNOSIS — Z136 Encounter for screening for cardiovascular disorders: Secondary | ICD-10-CM

## 2015-07-03 DIAGNOSIS — E1142 Type 2 diabetes mellitus with diabetic polyneuropathy: Secondary | ICD-10-CM

## 2015-07-03 LAB — COMPREHENSIVE METABOLIC PANEL
ALK PHOS: 64 U/L (ref 38–126)
ALT: 50 U/L (ref 14–54)
ANION GAP: 9 (ref 5–15)
AST: 67 U/L — ABNORMAL HIGH (ref 15–41)
Albumin: 2.6 g/dL — ABNORMAL LOW (ref 3.5–5.0)
BUN: 7 mg/dL (ref 6–20)
CO2: 25 mmol/L (ref 22–32)
Calcium: 8.5 mg/dL — ABNORMAL LOW (ref 8.9–10.3)
Chloride: 105 mmol/L (ref 101–111)
Creatinine, Ser: 0.81 mg/dL (ref 0.44–1.00)
GFR calc non Af Amer: >60 mL/min (ref >60)
Glucose, Bld: 209 mg/dL — ABNORMAL HIGH (ref 65–99)
Potassium: 4.5 mmol/L (ref 3.5–5.1)
SODIUM: 139 mmol/L (ref 135–145)
Total Bilirubin: 0.5 mg/dL (ref 0.3–1.2)
Total Protein: 6.3 g/dL — ABNORMAL LOW (ref 6.5–8.1)

## 2015-07-03 LAB — CBC
HEMATOCRIT: 32.2 % — AB (ref 36.0–46.0)
Hemoglobin: 10.2 g/dL — ABNORMAL LOW (ref 12.0–15.0)
MCH: 28.3 pg (ref 26.0–34.0)
MCHC: 31.7 g/dL (ref 30.0–36.0)
MCV: 89.2 fL (ref 78.0–100.0)
Platelets: 165 10*3/uL (ref 150–400)
RBC: 3.61 MIL/uL — AB (ref 3.87–5.11)
RDW: 13.2 % (ref 11.5–15.5)
WBC: 4.7 10*3/uL (ref 4.0–10.5)

## 2015-07-03 LAB — GLUCOSE, CAPILLARY
Glucose-Capillary: 169 mg/dL — ABNORMAL HIGH (ref 65–99)
Glucose-Capillary: 175 mg/dL — ABNORMAL HIGH (ref 65–99)
Glucose-Capillary: 266 mg/dL — ABNORMAL HIGH (ref 65–99)

## 2015-07-03 LAB — NM MYOCAR MULTI W/SPECT W/WALL MOTION / EF
CHL CUP MPHR: 157 {beats}/min
CSEPED: 7 min
CSEPHR: 63 %
CSEPPHR: 100 {beats}/min
Estimated workload: 1 METS
Exercise duration (sec): 15 s
Rest HR: 81 {beats}/min

## 2015-07-03 LAB — URINE CULTURE: Culture: 4000

## 2015-07-03 MED ORDER — DEXTROSE 5 % IV SOLN
1.0000 g | INTRAVENOUS | Status: AC
  Administered 2015-07-03: 1 g via INTRAVENOUS
  Filled 2015-07-03: qty 10

## 2015-07-03 MED ORDER — GABAPENTIN 300 MG PO CAPS: 300.0000 mg | ORAL_CAPSULE | Freq: Two times a day (BID) | ORAL | Status: DC

## 2015-07-03 MED ORDER — REGADENOSON 0.4 MG/5ML IV SOLN
INTRAVENOUS | Status: AC
  Filled 2015-07-03: qty 5

## 2015-07-03 MED ORDER — TECHNETIUM TC 99M SESTAMIBI GENERIC - CARDIOLITE
10.0000 | Freq: Once | INTRAVENOUS | Status: AC | PRN
  Administered 2015-07-03: 10 via INTRAVENOUS

## 2015-07-03 MED ORDER — REGADENOSON 0.4 MG/5ML IV SOLN
0.4000 mg | Freq: Once | INTRAVENOUS | Status: AC
  Administered 2015-07-03: 0.4 mg via INTRAVENOUS
  Filled 2015-07-03: qty 5

## 2015-07-03 MED ORDER — TECHNETIUM TC 99M SESTAMIBI GENERIC - CARDIOLITE
30.0000 | Freq: Once | INTRAVENOUS | Status: AC | PRN
  Administered 2015-07-03: 30 via INTRAVENOUS

## 2015-07-03 NOTE — Progress Notes (Signed)
Pt given discharge instructions, prescriptions, and care notes. Pt verbalized understanding AEB no further questions or concerns at this time. IV was discontinued, no redness, pain, or swelling noted at this time. Telemetry discontinued and Centralized Telemetry was notified. Pt left the floor via wheelchair with staff in stable condition. 

## 2015-07-03 NOTE — Progress Notes (Signed)
Family Medicine Teaching Service Daily Progress Note Intern Pager: [phone]  Patient name: [name] [name] Medical record number: [id number] Date of birth: 1951-02-20 Age: 64 y.o. Gender: female  Primary Care Provider: Vicenta Aly, [place] Consultants: Neurology, Cardiology Code Status: FULL  Pt Overview and Major Events to Date:  11/3: admitted for AMS and atypical chest pain; AMS likely due to UTI; started on Ceftriaxone  Assessment and Plan: [name] [name] is a 63 y.o. female presenting with altered mental status. PMH is significant for DM, HLD, CAD, hep C, OSA on CPAP, gastroparesis, HTN, HFpEF (60-65%), Acute pulmonary embolism (2013), TIA (left hemiparesis 2015 ), internal hemorrhoids, chronic respiratory failure.  Altered Mental Status likely due to UTI: Patient confused and altered per family earlier today not able to remember son's and grandsons name. Neurologic exam intact without any deficiency found. Patient with hx of chronic hepatitis C and fatty liver disease, however patient's ammonia level was 30, making hepatic encephalopathy unlikely. Electrolytes wnls. Patient with chronic respiratory failure on 2 Liters of oxygen, ABGs were wnl, making respiratory depression unlikely. Patient stable on 2 Liters of oxygen. On admission patient's vitals were stable, afebrile, blood pressure normotensive. UA (I/O cath) few bacterial, trace LE, negative nitrite, 3-6 WBC; patient with compliant of dysuria. Therefore, possible that patient was altered due to UTI. WBC wnl; lactic acid wnl. Patient altered mental status could be due to medications (vistaril, ativan, trazodone, solifeacin). UDS was negative, except for benzos. Furthermore. patient has significant hx of TIA (2015), T2DM, HLD, HTN. CT negative for any acute intercranial process. TSH 0.413 making myxedema crisis not likely.RPR negative as well. Vitamin B12 wnl. HIV nonreactive. - Consulted Neurology, appreciate recs: likely due to UTI also  benzodiazepine use, elevated CBGs; recommend order Vitamin B12.; signed off - Holding sedating home meds (to review prior to discharge) - Continue ASA and statin  - PT/OT: PT recc HH PT (face to face ordered); OT pending  - SLP: regular diet  Chest Pain: Heart score of 4. Significant hx includes stroke, HTN, HLD, Angina, and HTN. On admission EKG negative for ST elevation. Troponin negative x 1. Chest pain is mid-sternal, intermittent in nature, not improved with rest, possibly exacerbated by exertion, however patient states that the pain is positional, worse on her left side, however not reproducible on exam. EKG 11/3: normal sinus, with old inferior infarct.  - troponins: negative x 3 - Lipids: Chol 102, TAG 218, LDL40, HDL 18 - A1c 8.5 - Cardiology consulted, appreciate recs: likely noncardiac, low risk ECG/enzymes; plan for Lexiscan Myoview as outpatient - ECHO: EF 60-65%, calcfied annulus of mitral valve, mild mitral regurgitation  UTI: UA (I/O cath) few bacterial, trace LE, negative nitrite, 3-6 WBC. CBC wnl;  - Urine culturepending - Ceftriaxone(day 3 today @ 1400)  ?Blood in Stool: Patient with hx of black tarry stools about a week ago, frank blood in stools today. Epigastric pain worse after meals >> cannot r/o duodenal ulcer. Patient with hx of internal hemorrhoids. hgb 10.8 . Fecal occult Blood negative. Hgb/Hct stable from admission.  - Will continue to monitor CBC  - Abdominal CT negative for any acute finding, stable calcified granuloma on spleen   Normocytic Anemia: Admit hgb 10. Baseline Hgb 12. Iron studies unremarkable: Fe 48, TIBC 248, Ferritin 190, Transferrin 177; Saturation ratio 19% - hgb stable at 10.2 this AM   T2DM: HA1C 7.5> 8.5 Home Metformin, Januvia, and Amaryl. Holding these for now.  - Continue SSI, sensitive  - CBGs ACQHS  HFpEF: 10/2013 EF 97-41%, diastolic dysfunction grade 1. BNP 39.5  - ECHO 11/4: EF 60-65%, calcfied annulus of  mitral valve, mild mitral regurgitation - Will continue monitor  - Continue Bisoprolol  Chronic Hepatitis C: AST/ALT 112/73> 75/56>67/50 - INR/PT: 1.11/14.5 (11/4)  HTN: BP stable 103-130/65-70s - Holding lisinopril due soft blood pressures  CAD: cath 2005: LAD 40%; b. Myoview 3/12: EF 70%, no ischemia; c. Myoview 9/13: EF 76%, no ischemia. Carotid Dopplers 10/10: RICA 40-59%.  - Per cardiology on 12/2014 not concerned for agnina   FEN/GI: PPI, carb modified  Prophylaxis: Lovenox   Disposition: Home   Subjective:  - Requested Trazodone for insomnia overnight (home med). Given x 1 - sister not here today; did not leave home meds with patient. Spoke with son who states that patient's sister will return sometime today. - states she feels back to baseline.   Objective: Temp:  [98.1 F (36.7 C)-98.4 F (36.9 C)] 98.3 F (36.8 C) (11/05 0602) Pulse Rate:  [80-88] 83 (11/05 0602) Resp:  [16-18] 18 (11/05 0602) BP: (103-134)/(65-90) 126/65 mmHg (11/05 0602) SpO2:  [92 %-100 %] 97 % (11/05 0602) Physical Exam: General: NAD, alert and oriented x 3 CV: RRR, 2/6 systolic murmur; no rubs, or gallops PULM: CTAB, normal effort ABD: Soft, nontender, nondistended, NABS, no organomegaly SKIN: No rash or cyanosis; warm and well-perfused Neuro: awake, alert, no focal deficits  Laboratory:  Recent Labs Lab 07/01/15 1030 07/02/15 0638 07/03/15 0454  WBC 7.6 4.7 4.7  HGB 10.8* 10.3* 10.2*  HCT 33.7* 31.9* 32.2*  PLT 172 158 165    Recent Labs Lab 07/01/15 1030 07/02/15 0638 07/03/15 0454  NA 136 142 139  K 4.5 4.0 4.5  CL 107 108 105  CO2 21* 27 25  BUN 12 6 7   CREATININE 1.07* 0.75 0.81  CALCIUM 8.2* 8.4* 8.5*  PROT 7.3 6.1* 6.3*  BILITOT 0.8 0.5 0.5  ALKPHOS 71 61 64  ALT 73* 56* 50  AST 112* 75* 67*  GLUCOSE 243* 156* 209*    Smiley Houseman, MD 07/03/2015, 8:32 AM PGY-1, [facility] Intern pager: [phone], text pages welcome

## 2015-07-03 NOTE — Progress Notes (Signed)
Patient Name: [name] [name] Date of Encounter: 07/03/2015   SUBJECTIVE  Seen in nuc med. Continue to have mild intermittent chest pain. Currently having some 2/10 substernal sharp chest pain and sob.   CURRENT MEDS . aspirin  325 mg Oral Daily  . atorvastatin  10 mg Oral Daily  . bisoprolol  5 mg Oral Daily  . cefTRIAXone (ROCEPHIN)  IV  1 g Intravenous Q24H  . DULoxetine  30 mg Oral BID  . enoxaparin (LOVENOX) injection  40 mg Subcutaneous Q24H  . insulin aspart  0-9 Units Subcutaneous TID WC  . pantoprazole  40 mg Oral BID AC  . regadenoson      . regadenoson  0.4 mg Intravenous Once  . sodium chloride  3 mL Intravenous Q12H    OBJECTIVE  Filed Vitals:   07/03/15 1005 07/03/15 1007 07/03/15 1009 07/03/15 1011  BP: 154/73 132/68 140/70 139/72  Pulse: 93 98 96 94  Temp:      TempSrc:      Resp:      Height:      Weight:      SpO2:        Intake/Output Summary (Last 24 hours) at 07/03/15 1026 Last data filed at 07/03/15 0927  Gross per 24 hour  Intake 1458.33 ml  Output    500 ml  Net 958.33 ml   Filed Weights   07/01/15 1745  Weight: 183 lb 10.3 oz (83.3 kg)    PHYSICAL EXAM  General: Pleasant, NAD. Neuro: Alert and oriented X 3. Moves all extremities spontaneously. Psych: Normal affect. HEENT:  Normal  Neck: Supple without bruits or JVD. Lungs:  Resp regular and unlabored, CTA. Heart: RRR no s3, s4. systolic murmurs. Abdomen: Soft, non-tender, non-distended, BS + x 4.  Extremities: No clubbing, cyanosis or edema. DP/PT/Radials 2+ and equal bilaterally.  Accessory Clinical Findings  CBC  Recent Labs  07/02/15 0638 07/03/15 0454  WBC 4.7 4.7  HGB 10.3* 10.2*  HCT 31.9* 32.2*  MCV 91.7 89.2  PLT 158 517   Basic Metabolic Panel  Recent Labs  07/02/15 0638 07/03/15 0454  NA 142 139  K 4.0 4.5  CL 108 105  CO2 27 25  GLUCOSE 156* 209*  BUN 6 7  CREATININE 0.75 0.81  CALCIUM 8.4* 8.5*   Liver Function Tests  Recent Labs  07/02/15 0638 07/03/15 0454  AST 75* 67*  ALT 56* 50  ALKPHOS 61 64  BILITOT 0.5 0.5  PROT 6.1* 6.3*  ALBUMIN 2.6* 2.6*   No results for input(s): LIPASE, AMYLASE in the last 72 hours. Cardiac Enzymes  Recent Labs  07/01/15 1905 07/02/15 0023 07/02/15 0638  TROPONINI <0.03 <0.03 <0.03   BNP Invalid input(s): POCBNP D-Dimer No results for input(s): DDIMER in the last 72 hours. Hemoglobin A1C  Recent Labs  07/01/15 1905  HGBA1C 8.5*   Fasting Lipid Panel  Recent Labs  07/02/15 0638  CHOL 102  HDL 18*  LDLCALC 40  TRIG 218*  CHOLHDL 5.7   Thyroid Function Tests  Recent Labs  07/01/15 1905  TSH 0.413    TELE  Unable to review as patient seen in nuc med  Radiology/Studies  Ct Head Wo Contrast  07/01/2015  CLINICAL DATA:  Chest pain, hypotension, altered mental status EXAM: CT HEAD WITHOUT CONTRAST TECHNIQUE: Contiguous axial images were obtained from the base of the skull through the vertex without intravenous contrast. COMPARISON:  11/12/2013 FINDINGS: No skull fracture is noted. Paranasal sinuses and  mastoid air cells are unremarkable. No intracranial hemorrhage, mass effect or midline shift. No acute cortical infarction. No mass lesion is noted on this unenhanced scan. IMPRESSION: No acute intracranial abnormality. Electronically Signed   By: Lahoma Crocker M.D.   On: 07/01/2015 11:44   Ct Abdomen Pelvis W Contrast  07/01/2015  CLINICAL DATA:  Nausea, vomiting abdominal pain for 2 days. History of hepatitis-C. EXAM: CT ABDOMEN AND PELVIS WITH CONTRAST TECHNIQUE: Multidetector CT imaging of the abdomen and pelvis was performed using the standard protocol following bolus administration of intravenous contrast. CONTRAST:  152mL OMNIPAQUE IOHEXOL 300 MG/ML  SOLN COMPARISON:  02/26/2015 FINDINGS: Lower chest: The lung bases are clear of acute process. Minimal dependent subpleural atelectasis. No pulmonary lesions or pleural effusion. The heart is borderline enlarged.  No pericardial effusion. Coronary artery calcifications are noted. The distal esophagus is grossly normal. Hepatobiliary: Mild diffuse fatty infiltration of the liver. No focal hepatic lesions or intrahepatic biliary dilatation. The gallbladder is surgically absent. No common bile duct dilatation. Pancreas: No mass, inflammation or ductal dilatation. Spleen: Within normal limits in size. Multiple calcified granulomas are noted. Adrenals/Urinary Tract: The adrenal glands are normal in stable. Small renal cysts but no worrisome renal lesions, renal calculi or hydronephrosis. No obstructing ureteral calculi or bladder calculi. Moderate distention of the bladder is noted. Stomach/Bowel: The stomach, duodenum, small bowel and colon are grossly normal. No inflammatory changes, mass lesions or obstructive findings. The terminal ileum is normal. Vascular/Lymphatic: No mesenteric or retroperitoneal mass or adenopathy. Stable scattered borderline celiac axis and periportal lymph nodes. The aorta is normal in caliber. The branch vessels are patent. Stable atherosclerotic calcifications involving the aorta and branch vessel ostia. Other: The uterus is surgically absent. No pelvic mass or adenopathy. No free pelvic fluid collections. No inguinal mass or adenopathy. Musculoskeletal: No significant bony findings. Stable moderate degenerative changes involving the spine and osteoporosis. IMPRESSION: 1. No acute abdominal/pelvic findings, mass lesions or lymphadenopathy. 2. Mild diffuse fatty infiltration of the liver. 3. Moderate distention of the bladder. 4. Stable calcified granulomas in the spleen. Electronically Signed   By: Marijo Sanes M.D.   On: 07/01/2015 16:15   Dg Chest Portable 1 View  07/01/2015  CLINICAL DATA:  Chest pain for 3 days. Recent cough and congestion with fever 3 weeks ago. EXAM: PORTABLE CHEST 1 VIEW COMPARISON:  02/25/2015 and prior chest radiographs dating back to 04/16/2009 FINDINGS: The  cardiomediastinal silhouette is unremarkable. Mild peribronchial thickening and right hemidiaphragm elevation again noted. There is no evidence of focal airspace disease, pulmonary edema, suspicious pulmonary nodule/mass, pleural effusion, or pneumothorax. No acute bony abnormalities are identified. IMPRESSION: No evidence of acute cardiopulmonary disease. Mild chronic peribronchial thickening and elevation of the right hemidiaphragm. Electronically Signed   By: Margarette Canada M.D.   On: 07/01/2015 13:28    Echo 07/02/15 LV EF: 60% -  65%  ------------------------------------------------------------------- Indications:   Chest pain 786.51.  ------------------------------------------------------------------- History:  PMH:  Congestive heart failure. Chronic obstructive pulmonary disease. Risk factors: Diabetes mellitus.  ------------------------------------------------------------------- Study Conclusions  - Left ventricle: The cavity size was normal. Wall thickness was increased in a pattern of mild LVH. Systolic function was normal. The estimated ejection fraction was in the range of 60% to 65%. Wall motion was normal; there were no regional wall motion abnormalities. Left ventricular diastolic function parameters were normal. - Aortic valve: Valve area (VTI): 2.58 cm^2. Valve area (Vmax): 2.17 cm^2. Valve area (Vmean): 1.91 cm^2. - Mitral valve: Calcified annulus. Mildly  thickened leaflets . There was mild regurgitation.  ASSESSMENT AND PLAN This is a 64 y.o. female with a past medical history significant for mild CAD by remote angiography (40% LAD 2005, normal nuclear study 2013), HFPEF (moderate LVH, EF 70%), minimal aortic valve stenosis, moderate right carotid stenosis with TIA 2015 (left hemiparesis), DM, history of pulmonary embolism associated with knee surgery, palpitations, GERD and esophageal stricture requiring dilation, depression, untreated OSA (did use  CPAP in the past), chronic hepatitis C who was admitted on 07/01/15 with disorientation and dysuria. She also had some chest pain and cardiology was consulted.   Atypical chest pain, likely non cardiac, low risk ECG/enzymes. Troponin neg x3  -- Known mild CAD by remote cath and multiple coronary risk factors. Coronary calcification incidentally seen on CT abdomen, low thoracic slices. -- 2D ECHO showed LV EF of 60-65%, mild LVH, aortic sclerosis without stenosis, mild MR -- Myoview pending  Lethargy/confusion: sedatives? UTI related? Seems resolved now.   HFpEF: 10/2013 EF 27-74%, diastolic dysfunction grade 1. BNP 39.5  -- No s/s CHF.Repeat echo yesterday as above.   DM- HgA1c 8.5. Uncontrolled. Per primary  Signed, Bhagat,Bhavinkumar PA-C Pager [phone]  The patient was seen and examined, and I agree with the assessment and plan as documented above, with modifications as noted below. Nuclear stress test was normal. Continue ASA, Lipitor, and bisoprolol. Can be discharged from cardiovascular standpoint.  Kate Sable, MD, [facility]  07/03/2015 12:26 PM

## 2015-07-03 NOTE — Progress Notes (Signed)
Patient presented for Lexiscan. Tolerated procedure well. Result to follow.   Moncerrat Burnstein, PAC 

## 2015-07-03 NOTE — Discharge Summary (Signed)
[name] [name] Discharge Summary  Patient name: [name] [name] Medical record number: [id number] Date of birth: July 01, 1951 Age: 64 y.o. Gender: female Date of Admission: 07/01/2015  Date of Discharge: 07/03/15 Admitting Physician: Zenia Resides, MD  Primary Care Provider: Vicenta Aly, [place] Consultants: none  Indication for Hospitalization: Altered mental status, UTI  Discharge Diagnoses/Problem List:  AMS  UTI Atypical Chest pain  DM2 HFpEF Chronic Hepatitis C HTN CAD  Disposition: home   Discharge Condition: improved  Discharge Exam: Please refer to progress note from day of discharge  Brief [name] Course:  [name] is a 64 y.o. female presenting with altered mental status. PMH is significant for DM, HLD, CAD, hep C, OSA on CPAP, gastroparesis, HTN, HFpEF (60-65%), Acute pulmonary embolism (2013), TIA (left hemiparesis 2015 ), internal hemorrhoids, chronic respiratory failure.  Altered Mental Status:  Patient stated she "felt terrible" the night prior to admission, and the morning of admission she was confused as she could not remember the name of her son or grandson. She was not very arousable, seemed confused and sleepy per sister.   Neurologic exam intact without any deficiency found and patient was alert and oriented x 3 on admission. Patient was afebrile and had stable vitals. Altered mental status was likely due to UTI; patient did note of dysuria. UA obtained via I/O cath significant for few bacteria, trace LE, negative nitrite, and 3-6 WBC. Urine culture was collected (however, after one dose of Ceftriaxone). Patient received three doses of Ceftriaxone which was thought to be sufficient to treat patient's UTI as it was uncomplicated and additional patient was not febrile and did not have leukocytosis. Patient with history of chronic hepatitis C and fatty liver disease, however patient's ammonia level was 30, making hepatic  encephalopathy unlikely. Electrolytes and serum glucose were within normal limits. Patient with chronic respiratory failure on 2 Liters of oxygen, ABGs were within normal limits, making respiratory depression unlikely. Patient stable on 2 Liters of oxygen. On admission patient's vitals were stable, afebrile, blood pressure normotensive. WBC and lactic acid were within normal limits. UDS was negative, except for benzos. Furthermore, patient has significant history of TIA (2015), T2DM, HLD, HTN. CT was negative for any acute intercranial process and patient's neurological exam was unremarkable. TSH 0.413 making myxedema crisis not likely.RPR negative as well. Vitamin B12 was within normal limits. Sedating home medications were held during hospitalization (vistaril, ativan, trazodone, solifeacin), and only Trazodone PRN was restarted upton discharge.  Neurology was consulted who agreed AMS is likely due to UTI in combination of sedating home medications.   Dysuria:  UA obtained via I/O cath significant for few bacteria, trace LE, negative nitrite, and 3-6 WBC. Urine culture was collected (however, after one dose of Ceftriaxone). Patient received three doses of Ceftriaxone for UTI as mentioned above.   Subacute Chest Pain with history of CAD:  On admission,  noted of atypical intermittent mid-sternal chest pain which worsened with ambulation but not better with rest; has had for several months. Initial EKG showed no acute changes. Troponins were negative x 3. Cardiology was consulted for further evaluation; lexiscan myoview was completed and was a low risk study. The interpretation is noted below:  Nuclear Study Quality Overall image quality is good.Breast attenuation artifact was present and diaphragmatic attenuation artifact was present.     Rest Perfusion There is a defect present in the basal inferolateral and mid inferolateral location.There is a minimal amount of attenuation involving the inferior  and  lateral walls of the left ventricle which resolves the provided stress images. No scintigraphic evidence of prior infarction or pharmacologically induced ischemia.    Stress Perfusion There is a minimal amount of attenuation involving the inferior and lateral walls of the left ventricle which resolves the provided stress images. No scintigraphic evidence of prior infarction or pharmacologically induced ischemia.    Overall Study Impression Myocardial perfusion is normal. The study is normal. This is a low risk study. Overall left ventricular systolic function was normal. LV cavity size is normal. Nuclear stress EF: 66%. The left ventricular ejection fraction is normal (55-65%). There is no prior study for comparison.        Reported history of black tarry stools and frank blood in stools on day of admission: Patient does have a history of internal hemorrhoids. Additionally, noted of epigastric pain that is worse with after meals for the past 3-4 months.  Abdominal CT negative for any acute finding but does note of stable calcified granuloma on spleen. Fecal occult blood test was negative. Hemoglobin and hematocrit remained stable through out [name] course.   Normocytic Anemia: Per chart review, baseline hemoglobin 12. On admission hemoglobin was 10. Iron studies were unremarkable: Fe 48, TIBC 248, Saturation 19%, Ferrtin 190, Transferrin 177.   DM2:  Ac1 7.5. Patient was treated with SSI while in [name]. Diabetes medications were restarted at discharge.  HFpEF:  ECHO completed during this admission showed EF 60-65%, mild LVH, normal wall motion, mildly thickened mitral leaflets with mild regurgitation. (Previous ECHO on 10/2013 showed preserved ejection fraction with G1DD).  BNP 39.5. Patient was euvolemic on exam. Continued home Bisoprolol.   Chronic Hepatitis C: Liver function tests were followed through [name] course. AST/ALT 112/73> 75/56>67/50; INR/PT: 1.11/14.5  (11/4)  HTN: Blood pressures were soft/stable without home medication Lisinopril. Therefore, lisinopril was held at discharge to avoid low blood pressures  Issues for Follow Up:  - Follow up Blood Pressures as home Lisinopril was held during [name] stay and at discharge due to stable blood pressures without this medication and to avoid lowering blood pressure too much. Consider restarting if blood pressures are elevated.  - follow up UTI after treatment - vistaril, ativan, solifeacin were held during hospitalization and at discharge to decrease risk of sedation and AMS.   Significant Procedures:  Lexiscan Myoview: Nuclear Study Quality Overall image quality is good.Breast attenuation artifact was present and diaphragmatic attenuation artifact was present.     Rest Perfusion There is a defect present in the basal inferolateral and mid inferolateral location.There is a minimal amount of attenuation involving the inferior and lateral walls of the left ventricle which resolves the provided stress images. No scintigraphic evidence of prior infarction or pharmacologically induced ischemia.    Stress Perfusion There is a minimal amount of attenuation involving the inferior and lateral walls of the left ventricle which resolves the provided stress images. No scintigraphic evidence of prior infarction or pharmacologically induced ischemia.    Overall Study Impression Myocardial perfusion is normal. The study is normal. This is a low risk study. Overall left ventricular systolic function was normal. LV cavity size is normal. Nuclear stress EF: 66%. The left ventricular ejection fraction is normal (55-65%). There is no prior study for comparison.        Significant Labs and Imaging:   Recent Labs Lab 07/01/15 1030 07/02/15 0638 07/03/15 0454  WBC 7.6 4.7 4.7  HGB 10.8* 10.3* 10.2*  HCT 33.7* 31.9* 32.2*  PLT 172 158 165  Recent Labs Lab 07/01/15 1030 07/02/15 0638  07/03/15 0454  NA 136 142 139  K 4.5 4.0 4.5  CL 107 108 105  CO2 21* 27 25  GLUCOSE 243* 156* 209*  BUN 12 6 7   CREATININE 1.07* 0.75 0.81  CALCIUM 8.2* 8.4* 8.5*  ALKPHOS 71 61 64  AST 112* 75* 67*  ALT 73* 56* 50  ALBUMIN 3.0* 2.6* 2.6*   Results/Tests Pending at Time of Discharge: none  Discharge Medications:    Medication List    STOP taking these medications        hydrOXYzine 25 MG capsule  Commonly known as:  VISTARIL     lisinopril 20 MG tablet  Commonly known as:  PRINIVIL,ZESTRIL     LORazepam 1 MG tablet  Commonly known as:  ATIVAN     solifenacin 5 MG tablet  Commonly known as:  VESICARE      TAKE these medications        aspirin 325 MG tablet  Take 1 tablet (325 mg total) by mouth daily.     atorvastatin 10 MG tablet  Commonly known as:  LIPITOR  Take 1 tablet (10 mg total) by mouth daily.     bisoprolol 5 MG tablet  Commonly known as:  ZEBETA  TAKE ONE TABLET BY MOUTH ONCE DAILY     DULoxetine 30 MG capsule  Commonly known as:  CYMBALTA  Take 30 mg by mouth 2 (two) times daily.     gabapentin 300 MG capsule  Commonly known as:  NEURONTIN  Take 1 capsule (300 mg total) by mouth 2 (two) times daily.     gemfibrozil 600 MG tablet  Commonly known as:  LOPID  Take 600 mg by mouth 2 (two) times daily before a meal.     glimepiride 4 MG tablet  Commonly known as:  AMARYL  Take 8 mg by mouth daily with breakfast.     JANUVIA 100 MG tablet  Generic drug:  sitaGLIPtin  Take 100 mg by mouth daily.     metFORMIN 1000 MG tablet  Commonly known as:  GLUCOPHAGE  Take 1,000 mg by mouth 2 (two) times daily.     OXYGEN  2-3 L by Other route continuous.     pantoprazole 40 MG tablet  Commonly known as:  PROTONIX  Take 40 mg by mouth 2 (two) times daily before a meal.     traZODone 50 MG tablet  Commonly known as:  DESYREL  Take 50 mg by mouth at bedtime as needed for sleep.        Discharge Instructions: Please refer to Patient  Instructions section of EMR for full details.  Patient was counseled important signs and symptoms that should prompt return to medical care, changes in medications, dietary instructions, activity restrictions, and follow up appointments.   Follow-Up Appointments: Follow-up Information    Follow up with Vicenta Aly, FNP.   Specialty:  Nurse Practitioner   Why:  Please make a [name] follow up appointment in about 1 week   Contact information:   [place] [address] [phone]       Smiley Houseman, MD 07/04/2015, 8:17 PM PGY-1, [place]

## 2015-07-03 NOTE — Care Management Note (Signed)
Case Management Note  Patient Details  Name: [name] [name] MRN: [id number] Date of Birth: [birth year]  Subjective/Objective:                   AMS Action/Plan: Discharge planning  Expected Discharge Date:  07/03/15               Expected Discharge Plan:  Home/Self Care  In-House Referral:     Discharge planning Services  CM Consult  Post Acute Care Choice:  Home Health Choice offered to:  Patient  DME Arranged:  [facility] rolling DME Agency:  [place]:  PT [facility] Agency:  [place]  Status of Service:  Completed, signed off  Medicare Important Message Given:    Date Medicare IM Given:    Medicare IM give by:    Date Additional Medicare IM Given:    Additional Medicare Important Message give by:     If discussed at [place] of Stay Meetings, dates discussed:    Additional Comments: CM spoke with pt to offer choice of home health agency.  Pt chooses [facility] to render HHPT.  Address and contact information verified with pt with new number [phone].  Referral called to [facility] rep, Tiffany with new number.  CM called [facility] DME rep, Merry Proud to please deliver the rolling walker to room so pt can discharge.  No other CM needs were communicated. Dellie Catholic, RN 07/03/2015, 1:01 PM

## 2015-07-03 NOTE — Discharge Instructions (Signed)
You were admitted for confusion, chest pain, and burning with urination.  - Your confusion was likely due to the urinary tract infection you were found to have. You completed a course of antibiotics for the infection.  - you noted of some chest pain on admission. Your EKG was unremarkable. The heart doctors saw you while you were in the hospital. They recommended a stress test which was completed during your hospital stay.  - For your diabetes, your A1c was repeated and was elevated compared to the prior value which means your blood sugars were not controlled as well as they should be. Please discuss this with your primary care provider. - Please make an appointment with you primary care provider in about a week for a hospital follow up visit to make sure you are doing well after leaving the hospital  - We have made some changes to your medications. Please take the medications as noted on the discharge paperwork.  - We have asked you to not take Lisinopril (a blood pressure medication) until you can discuss this with your primary care provider. This is because your blood pressure has been okay without this medication while you were in the hospital. You primary care provider will decide when to restart this medication.

## 2015-07-04 ENCOUNTER — Emergency Department (HOSPITAL_COMMUNITY)
Admission: EM | Admit: 2015-07-04 | Discharge: 2015-07-04 | Disposition: A | Discharge status: Home / Self Care | Payer: Medicare Other | Attending: Emergency Medicine | Admitting: Emergency Medicine

## 2015-07-04 ENCOUNTER — Encounter (HOSPITAL_COMMUNITY): Payer: Self-pay | Admitting: *Deleted

## 2015-07-04 DIAGNOSIS — J449 Chronic obstructive pulmonary disease, unspecified: Secondary | ICD-10-CM | POA: Insufficient documentation

## 2015-07-04 DIAGNOSIS — M797 Fibromyalgia: Secondary | ICD-10-CM | POA: Insufficient documentation

## 2015-07-04 DIAGNOSIS — G8929 Other chronic pain: Secondary | ICD-10-CM | POA: Insufficient documentation

## 2015-07-04 DIAGNOSIS — R109 Unspecified abdominal pain: Secondary | ICD-10-CM

## 2015-07-04 DIAGNOSIS — R300 Dysuria: Secondary | ICD-10-CM

## 2015-07-04 DIAGNOSIS — Z8543 Personal history of malignant neoplasm of ovary: Secondary | ICD-10-CM | POA: Diagnosis not present

## 2015-07-04 DIAGNOSIS — I2510 Atherosclerotic heart disease of native coronary artery without angina pectoris: Secondary | ICD-10-CM | POA: Diagnosis not present

## 2015-07-04 DIAGNOSIS — Z86711 Personal history of pulmonary embolism: Secondary | ICD-10-CM | POA: Diagnosis not present

## 2015-07-04 DIAGNOSIS — G4730 Sleep apnea, unspecified: Secondary | ICD-10-CM | POA: Insufficient documentation

## 2015-07-04 DIAGNOSIS — E119 Type 2 diabetes mellitus without complications: Secondary | ICD-10-CM | POA: Insufficient documentation

## 2015-07-04 DIAGNOSIS — R110 Nausea: Secondary | ICD-10-CM

## 2015-07-04 DIAGNOSIS — N189 Chronic kidney disease, unspecified: Secondary | ICD-10-CM | POA: Diagnosis not present

## 2015-07-04 DIAGNOSIS — I509 Heart failure, unspecified: Secondary | ICD-10-CM | POA: Diagnosis not present

## 2015-07-04 DIAGNOSIS — F419 Anxiety disorder, unspecified: Secondary | ICD-10-CM | POA: Insufficient documentation

## 2015-07-04 DIAGNOSIS — N390 Urinary tract infection, site not specified: Secondary | ICD-10-CM | POA: Diagnosis not present

## 2015-07-04 DIAGNOSIS — G43909 Migraine, unspecified, not intractable, without status migrainosus: Secondary | ICD-10-CM | POA: Diagnosis not present

## 2015-07-04 DIAGNOSIS — I129 Hypertensive chronic kidney disease with stage 1 through stage 4 chronic kidney disease, or unspecified chronic kidney disease: Secondary | ICD-10-CM | POA: Insufficient documentation

## 2015-07-04 DIAGNOSIS — Z86718 Personal history of other venous thrombosis and embolism: Secondary | ICD-10-CM | POA: Insufficient documentation

## 2015-07-04 DIAGNOSIS — M1990 Unspecified osteoarthritis, unspecified site: Secondary | ICD-10-CM | POA: Diagnosis not present

## 2015-07-04 DIAGNOSIS — K219 Gastro-esophageal reflux disease without esophagitis: Secondary | ICD-10-CM | POA: Diagnosis not present

## 2015-07-04 DIAGNOSIS — R350 Frequency of micturition: Secondary | ICD-10-CM

## 2015-07-04 DIAGNOSIS — Z8619 Personal history of other infectious and parasitic diseases: Secondary | ICD-10-CM | POA: Diagnosis not present

## 2015-07-04 DIAGNOSIS — Z9981 Dependence on supplemental oxygen: Secondary | ICD-10-CM | POA: Diagnosis not present

## 2015-07-04 DIAGNOSIS — F329 Major depressive disorder, single episode, unspecified: Secondary | ICD-10-CM | POA: Diagnosis not present

## 2015-07-04 DIAGNOSIS — E785 Hyperlipidemia, unspecified: Secondary | ICD-10-CM | POA: Insufficient documentation

## 2015-07-04 LAB — URINALYSIS, ROUTINE W REFLEX MICROSCOPIC
BILIRUBIN URINE: NEGATIVE
GLUCOSE, UA: NEGATIVE mg/dL
HGB URINE DIPSTICK: NEGATIVE
KETONES UR: NEGATIVE mg/dL
Leukocytes, UA: NEGATIVE
Nitrite: NEGATIVE
PROTEIN: NEGATIVE mg/dL
Specific Gravity, Urine: 1.019 (ref 1.005–1.030)
Urobilinogen, UA: 1 mg/dL (ref 0.0–1.0)
pH: 5.5 (ref 5.0–8.0)

## 2015-07-04 MED ORDER — CEPHALEXIN 500 MG PO CAPS: ORAL_CAPSULE | ORAL | Status: DC

## 2015-07-04 MED ORDER — ONDANSETRON 4 MG PO TBDP
8.0000 mg | ORAL_TABLET | Freq: Once | ORAL | Status: AC
  Administered 2015-07-04: 8 mg via ORAL
  Filled 2015-07-04: qty 2

## 2015-07-04 MED ORDER — ONDANSETRON HCL 8 MG PO TABS: 8.0000 mg | ORAL_TABLET | Freq: Three times a day (TID) | ORAL | Status: DC | PRN

## 2015-07-04 NOTE — Discharge Instructions (Signed)
Stay very well hydrated with plenty of water throughout the day. Take antibiotic until completed. Use zofran as needed for nausea. Take tylenol or motrin as needed for pain. Follow up with primary care physician in 4 days for recheck of ongoing symptoms but return to ER for emergent changing or worsening of symptoms. Please seek immediate care if you develop the following: You develop back pain.  Your symptoms are no better, or worse in 3 days. There is severe back pain or lower abdominal pain.  You develop chills.  You have a fever.  There is nausea or vomiting.  There is continued burning or discomfort with urination.     Urinary Tract Infection A urinary tract infection (UTI) can occur any place along the urinary tract. The tract includes the kidneys, ureters, bladder, and urethra. A type of germ called bacteria often causes a UTI. UTIs are often helped with antibiotic medicine.  HOME CARE   If given, take antibiotics as told by your doctor. Finish them even if you start to feel better.  Drink enough fluids to keep your pee (urine) clear or pale yellow.  Avoid tea, drinks with caffeine, and bubbly (carbonated) drinks.  Pee often. Avoid holding your pee in for a long time.  Pee before and after having sex (intercourse).  Wipe from front to back after you poop (bowel movement) if you are a woman. Use each tissue only once. GET HELP RIGHT AWAY IF:   You have back pain.  You have lower belly (abdominal) pain.  You have chills.  You feel sick to your stomach (nauseous).  You throw up (vomit).  Your burning or discomfort with peeing does not go away.  You have a fever.  Your symptoms are not better in 3 days. MAKE SURE YOU:   Understand these instructions.  Will watch your condition.  Will get help right away if you are not doing well or get worse.   This information is not intended to replace advice given to you by your health care provider. Make sure you discuss any  questions you have with your health care provider.   Document Released: 01/31/2008 Document Revised: 09/04/2014 Document Reviewed: 03/14/2012 Elsevier Interactive Patient Education 2016 Elsevier Inc.  Nausea, Adult Nausea means you feel sick to your stomach or need to throw up (vomit). It may be a sign of a more serious problem. If nausea gets worse, you may throw up. If you throw up a lot, you may lose too much body fluid (dehydration). HOME CARE   Get plenty of rest.  Ask your doctor how to replace body fluid losses (rehydrate).  Eat small amounts of food. Sip liquids more often.  Take all medicines as told by your doctor. GET HELP RIGHT AWAY IF:  You have a fever.  You pass out (faint).  You keep throwing up or have blood in your throw up.  You are very weak, have dry lips or a dry mouth, or you are very thirsty (dehydrated).  You have dark or bloody poop (stool).  You have very bad chest or belly (abdominal) pain.  You do not get better after 2 days, or you get worse.  You have a headache. MAKE SURE YOU:  Understand these instructions.  Will watch your condition.  Will get help right away if you are not doing well or get worse.   This information is not intended to replace advice given to you by your health care provider. Make sure you discuss any  questions you have with your health care provider.   Document Released: 08/03/2011 Document Revised: 11/06/2011 Document Reviewed: 08/03/2011 Elsevier Interactive Patient Education 2016 Elsevier Inc.  Flank Pain Flank pain is pain in your side. The flank is the area of your side between your upper belly (abdomen) and your back. Pain in this area can be caused by many different things. [place] care and treatment will depend on the cause of your pain.  Rest as told by your doctor.  Drink enough fluids to keep your pee (urine) clear or pale yellow.  Only take medicine as told by your doctor.  Tell your  doctor about any changes in your pain.  Follow up with your doctor. GET HELP RIGHT AWAY IF:   Your pain does not get better with medicine.   You have new symptoms or your symptoms get worse.  Your pain gets worse.   You have belly (abdominal) pain.   You are short of breath.   You always feel sick to your stomach (nauseous).   You keep throwing up (vomiting).   You have puffiness (swelling) in your belly.   You feel light-headed or you pass out (faint).   You have blood in your pee.  You have a fever or lasting symptoms for more than 2-3 days.  You have a fever and your symptoms suddenly get worse. MAKE SURE YOU:   Understand these instructions.  Will watch your condition.  Will get help right away if you are not doing well or get worse.   This information is not intended to replace advice given to you by your health care provider. Make sure you discuss any questions you have with your health care provider.   Document Released: 05/23/2008 Document Revised: 09/04/2014 Document Reviewed: 03/28/2012 Elsevier Interactive Patient Education [employer].

## 2015-07-04 NOTE — ED Provider Notes (Signed)
CSN: [id number]     Arrival date & time 07/04/15  1145 History   First MD Initiated Contact with Patient 07/04/15 1230     Chief Complaint  Patient presents with  . Dysuria     (Consider location/radiation/quality/duration/timing/severity/associated sxs/prior Treatment) HPI Comments: [name] [name] is a 64 y.o. female with a PMHx of HLD, dizziness, gastroparesis, GERD, CAD, HTN, depression, aortic stenosis, CHF, DM2, ovarian cancer, HepC, and OSA, with a PSHx of abd hysterectomy, tonsillectomy, and cholecystectomy, who presents to the ED with complaints of ongoing dysuria, urinary frequency and urgency, malodorous urine, and intermittent lower abdominal pain. She was just discharged yesterday after a hospital stay for altered mental status/question UTI, was given ceftriaxone 3 in the hospital, but was not sent home with any antibiotics. She reports her lower abdominal pain is 5/10 radiating to the low back/flank area, intermittent, pressure-like and burning, only occurring with urination, with no treatments tried prior to arrival. Associated symptoms include nausea and chills. She denies any fevers, chest pain, shortness breath, vomiting, diarrhea, constipation, melena, hematochezia, hematuria, vaginal leading or discharge, numbness, tingling, weakness, recent travel, EtOH use, NSAID use, or suspicious food intake. Her grandson is sick at home with a cold. No other known sick contacts.  Patient is a 64 y.o. female presenting with dysuria. The history is provided by the patient. No language interpreter was used.  Dysuria Pain quality:  Burning Pain severity:  Moderate Onset quality:  Gradual Duration:  1 day Timing:  Intermittent Progression:  Unchanged Chronicity:  Recurrent Recent urinary tract infections: yes   Relieved by:  None tried Worsened by:  Nothing tried Ineffective treatments:  None tried Urinary symptoms: foul-smelling urine and frequent urination   Urinary symptoms: no  hematuria   Associated symptoms: abdominal pain (intermittent, currently resolved), flank pain and nausea   Associated symptoms: no fever, no vaginal discharge and no vomiting     Past Medical History  Diagnosis Date  . Hyperlipidemia   . Benign neoplasm of stomach   . Dizziness and giddiness   . Gastroparesis   . Esophageal reflux   . CAD (coronary artery disease)     a.  cath 2005: LAD 40%;  b. Myoview 3/12: EF 70%, no ischemia;  c. Myoview 9/13: EF 76%, no ischemia  . HTN (hypertension)   . Palpitations   . Depression   . Aortic stenosis     Echo 8/13: EF 55-60%, Mild LVH, mild AS, mean gradient 12 mmHg,   . PONV (postoperative nausea and vomiting)   . TIA (transient ischemic attack) 2015  . Pulmonary embolism ([place])   . Carotid stenosis     right, moderate/notes 07/01/2015  . Esophageal stricture   . Family history of adverse reaction to anesthesia     "my sister gets sick"  . CHF (congestive heart failure) ([place]) 06-13-12    episode after Cancer surgery  . COPD (chronic obstructive pulmonary disease) ([place])   . On home oxygen therapy     "2L all the time" (07/11/2015)  . Chronic bronchitis ([place])     "get it ~ q yr"  (07/11/2015)  . Sleep apnea 06-13-12    "used to wear CPAP; can't sleep w/it" (07/01/2015) cpap setting of 2 when used cpap  . Type II diabetes mellitus ([place])   . Anemia   . History of blood transfusion     "when I had my hemorrhoid OR"  . Hepatitis C     "still active" (07/01/2015)  . Daily  headache   . Migraine     "~ 5 times/wk" (07/01/2015)  . Arthritis     "qwhere; especially in my back and hands" (07/01/2015)  . Fibromyalgia   . Chronic lower back pain   . Anxiety   . Chronic kidney disease     "my kidneys have been about to shut down" (07/01/2015)  . Ovarian cancer ([place]) 2000; 2002   Past Surgical History  Procedure Laterality Date  . Tubal ligation    . Oophorectomy      1 ovary removed, then later last ovary removed  . Anterior and posterior  vaginal repair    . Knee arthroscopy Left   . Cataract extraction w/ intraocular lens  implant, bilateral Bilateral 03/2013  . Breast biopsy Left 1972  . Colonoscopy N/A 08/11/2013    Procedure: COLONOSCOPY;  Surgeon: Inda Castle, MD;  Location: [facility];  Service: Endoscopy;  Laterality: N/A;  . Esophagogastroduodenoscopy (egd) with esophageal dilation      Archie Endo 07/01/2015  . Appendectomy    . Cholecystectomy open    . Abdominal hysterectomy    . Band hemorrhoidectomy    . Back surgery    . Lumbar disc surgery    . Dilation and curettage of uterus    . Tonsillectomy  ~ 1958   Family History  Problem Relation Age of Onset  . Brain cancer Father     spine cancer  . Heart disease Mother   . Breast cancer Sister   . Kidney disease Sister   . Diabetes Brother   . Esophageal cancer Brother   . Diabetes Maternal Aunt   . Diabetes Paternal Aunt   . Stomach cancer Paternal Grandfather    Social History  Substance Use Topics  . Smoking status: Never Smoker   . Smokeless tobacco: Never Used  . Alcohol Use: No     Comment: 07/01/2015 "stopped  in before 2000"   OB History    No data available     Review of Systems  Constitutional: Positive for chills. Negative for fever.  Respiratory: Negative for shortness of breath.   Cardiovascular: Negative for chest pain.  Gastrointestinal: Positive for nausea and abdominal pain (intermittent, currently resolved). Negative for vomiting, diarrhea, constipation and blood in stool.  Genitourinary: Positive for dysuria, urgency, frequency and flank pain. Negative for hematuria, vaginal bleeding and vaginal discharge.  Musculoskeletal: Negative for myalgias and arthralgias.  Skin: Negative for color change.  Allergic/Immunologic: Positive for immunocompromised state (diabetic).  Neurological: Negative for weakness and numbness.  Psychiatric/Behavioral: Negative for confusion.   10 Systems reviewed and are negative for acute change  except as noted in the HPI.    Allergies  Sunflower oil; Iodine; Metoclopramide hcl; Sulfamethoxazole; and Shellfish allergy  Home Medications   Prior to Admission medications   Medication Sig Start Date End Date Taking? Authorizing Provider  aspirin 325 MG tablet Take 1 tablet (325 mg total) by mouth daily. 11/13/13   Orson Eva, MD  atorvastatin (LIPITOR) 10 MG tablet Take 1 tablet (10 mg total) by mouth daily. 06/04/15   Fay Records, MD  bisoprolol (ZEBETA) 5 MG tablet TAKE ONE TABLET BY MOUTH ONCE DAILY 06/01/15   Fay Records, MD  DULoxetine (CYMBALTA) 30 MG capsule Take 30 mg by mouth 2 (two) times daily.  06/02/15   Historical Provider, MD  gabapentin (NEURONTIN) 300 MG capsule Take 1 capsule (300 mg total) by mouth 2 (two) times daily. 07/03/15   Smiley Houseman, MD  gemfibrozil (LOPID) 600 MG tablet Take 600 mg by mouth 2 (two) times daily before a meal.    Historical Provider, MD  glimepiride (AMARYL) 4 MG tablet Take 8 mg by mouth daily with breakfast.  06/21/13   Historical Provider, MD  JANUVIA 100 MG tablet Take 100 mg by mouth daily.  06/02/15   Historical Provider, MD  metFORMIN (GLUCOPHAGE) 1000 MG tablet Take 1,000 mg by mouth 2 (two) times daily. 01/07/15   Historical Provider, MD  OXYGEN-HELIUM IN 2-3 L by Other route continuous.     Historical Provider, MD  pantoprazole (PROTONIX) 40 MG tablet Take 40 mg by mouth 2 (two) times daily before a meal.    Historical Provider, MD  traZODone (DESYREL) 50 MG tablet Take 50 mg by mouth at bedtime as needed for sleep.  05/07/14   Historical Provider, MD   BP 131/66 mmHg  Pulse 86  Temp(Src) 98.7 F (37.1 C) (Oral)  Resp 18  Ht 5\' 6"  (1.676 m)  Wt 180 lb (81.647 kg)  BMI 29.07 kg/m2  SpO2 97% Physical Exam  Constitutional: She is oriented to person, place, and time. Vital signs are normal. She appears well-developed and well-nourished.  Non-toxic appearance. No distress.  Afebrile, nontoxic, NAD  HENT:  Head: Normocephalic  and atraumatic.  Mouth/Throat: Oropharynx is clear and moist and mucous membranes are normal.  Eyes: Conjunctivae and EOM are normal. Right eye exhibits no discharge. Left eye exhibits no discharge.  Neck: Normal range of motion. Neck supple.  Cardiovascular: Normal rate, regular rhythm, normal heart sounds and intact distal pulses.  Exam reveals no gallop and no friction rub.   No murmur heard. Pulmonary/Chest: Effort normal and breath sounds normal. No respiratory distress. She has no decreased breath sounds. She has no wheezes. She has no rhonchi. She has no rales.  Abdominal: Soft. Normal appearance and bowel sounds are normal. She exhibits no distension. There is no tenderness. There is no rigidity, no rebound, no guarding, no CVA tenderness, no tenderness at McBurney's point and negative Murphy's sign.  Soft, NTND, +BS throughout, no r/g/r, neg murphy's, neg mcburney's, no CVA TTP   Musculoskeletal: Normal range of motion.  Neurological: She is alert and oriented to person, place, and time. She has normal strength. No sensory deficit.  Skin: Skin is warm, dry and intact. No rash noted.  Psychiatric: She has a normal mood and affect.  Nursing note and vitals reviewed.   ED Course  Procedures (including critical care time) Labs Review Labs Reviewed  URINALYSIS, ROUTINE W REFLEX MICROSCOPIC (NOT AT [facility]) - Abnormal; Notable for the following:    APPearance CLOUDY (*)    All other components within normal limits  URINE CULTURE   Results for KAYDON, CREEDON (MRN [id number]) as of 07/04/2015 13:31  Ref. Range 07/01/2015 14:25 07/02/2015 15:30  Appearance Latest Ref Range: CLEAR  CLOUDY (A) CLOUDY (A)  Bacteria, UA Latest Ref Range: RARE  MANY (A) FEW (A)  Bilirubin Urine Latest Ref Range: NEGATIVE  NEGATIVE SMALL (A)  Casts Latest Ref Range: NEGATIVE   HYALINE CASTS (A)  Color, Urine Latest Ref Range: YELLOW  YELLOW AMBER (A)  Glucose Latest Ref Range: NEGATIVE mg/dL NEGATIVE NEGATIVE   Hgb urine dipstick Latest Ref Range: NEGATIVE  NEGATIVE NEGATIVE  Ketones, ur Latest Ref Range: NEGATIVE mg/dL NEGATIVE NEGATIVE  Leukocytes, UA Latest Ref Range: NEGATIVE  MODERATE (A) TRACE (A)  Nitrite Latest Ref Range: NEGATIVE  NEGATIVE NEGATIVE  pH Latest Ref Range: 5.0-8.0  5.5 5.5  Protein Latest Ref Range: NEGATIVE mg/dL NEGATIVE NEGATIVE  RBC / HPF Latest Ref Range: <3 RBC/hpf 0-2 0-2  Specific Gravity, Urine Latest Ref Range: 1.005-1.030  1.010 1.022  Squamous Epithelial / LPF Latest Ref Range: RARE  MANY (A) FEW (A)  Urine-Other Unknown  MUCOUS PRESENT  Urobilinogen, UA Latest Ref Range: 0.0-1.0 mg/dL 1.0 1.0  WBC, UA Latest Ref Range: <3 WBC/hpf 21-50 3-6   UCx 07/02/15 (after first dose of rocephin): 4000 CFU/hpf insignificant growth  Imaging Review  CT abd/pelvis 07/01/15 Study Result     CLINICAL DATA: Nausea, vomiting abdominal pain for 2 days. History of hepatitis-C.  EXAM: CT ABDOMEN AND PELVIS WITH CONTRAST  TECHNIQUE: Multidetector CT imaging of the abdomen and pelvis was performed using the standard protocol following bolus administration of intravenous contrast.  CONTRAST: 182mL OMNIPAQUE IOHEXOL 300 MG/ML SOLN  COMPARISON: 02/26/2015  FINDINGS: Lower chest: The lung bases are clear of acute process. Minimal dependent subpleural atelectasis. No pulmonary lesions or pleural effusion. The heart is borderline enlarged. No pericardial effusion. Coronary artery calcifications are noted. The distal esophagus is grossly normal.  Hepatobiliary: Mild diffuse fatty infiltration of the liver. No focal hepatic lesions or intrahepatic biliary dilatation. The gallbladder is surgically absent. No common bile duct dilatation.  Pancreas: No mass, inflammation or ductal dilatation.  Spleen: Within normal limits in size. Multiple calcified granulomas are noted.  Adrenals/Urinary Tract: The adrenal glands are normal in stable. Small renal cysts but  no worrisome renal lesions, renal calculi or hydronephrosis. No obstructing ureteral calculi or bladder calculi. Moderate distention of the bladder is noted.  Stomach/Bowel: The stomach, duodenum, small bowel and colon are grossly normal. No inflammatory changes, mass lesions or obstructive findings. The terminal ileum is normal.  Vascular/Lymphatic: No mesenteric or retroperitoneal mass or adenopathy. Stable scattered borderline celiac axis and periportal lymph nodes. The aorta is normal in caliber. The branch vessels are patent. Stable atherosclerotic calcifications involving the aorta and branch vessel ostia.  Other: The uterus is surgically absent. No pelvic mass or adenopathy. No free pelvic fluid collections. No inguinal mass or adenopathy.  Musculoskeletal: No significant bony findings. Stable moderate degenerative changes involving the spine and osteoporosis.  IMPRESSION: 1. No acute abdominal/pelvic findings, mass lesions or lymphadenopathy. 2. Mild diffuse fatty infiltration of the liver. 3. Moderate distention of the bladder. 4. Stable calcified granulomas in the spleen.   Electronically Signed  By: Marijo Sanes M.D.  On: 07/01/2015 16:15    Nm Myocar Multi W/spect W/wall Motion / Ef  07/03/2015   No T wave inversion was noted during stress.  There was no ST segment deviation noted during stress.  The study is normal.  This is a low risk study.  The left ventricular ejection fraction is normal (55-65%).  Nuclear stress EF: 66%.    I have personally reviewed and evaluated these images and lab results as part of my medical decision-making.   EKG Interpretation None      MDM   Final diagnoses:  Dysuria  Urinary frequency  UTI (lower urinary tract infection)  Nausea  Flank pain    64 y.o. female here with dysuria, urinary frequency and urgency, intermittent lower abdominal pain, and dysuria. She was just discharged home after a hospital  stay for altered mental status/question UTI, was given 3 doses of ceftriaxone in the hospital, but was not discharged home with any antibiotics. U/A initially on 11/3 was contaminated, second U/A collected after first dose of ceftriaxone had trace  leuks, 3-6 WBC, few bacteria and squamous cells, and culture grew out 4,000CFU/hpf which was considered insignificant but she had already received 1 dose of rocephin. CT abd/pelvis done 07/01/15 showed small renal cysts, making her a more complicated UTI. Today she feels some mild flank pain and chills. On exam, no flank or abd tenderness. Doubt need for labs or imaging. Will obtain UA, and we'll give Zofran ODT and reassess shortly.  1:36 PM U/A cloudy but nitrite/leuk neg. Given symptoms, she could just be incompletely treated for UTI, especially since she is diabetic and has renal cysts, she is a more complicated UTI which warrants longer duration of treatment. Nausea improved, will PO challenge then likely discharge with keflex and zofran. F/up with PCP in 4 days.  2:14 PM Tolerating PO well. Will d/c home with previously discussed plan. I explained the diagnosis and have given explicit precautions to return to the ER including for any other new or worsening symptoms. The patient understands and accepts the medical plan as it's been dictated and I have answered their questions. Discharge instructions concerning home care and prescriptions have been given. The patient is STABLE and is discharged to home in good condition.   BP 132/100 mmHg  Pulse 78  Temp(Src) 98.7 F (37.1 C) (Oral)  Resp 19  Ht 5\' 6"  (1.676 m)  Wt 180 lb (81.647 kg)  BMI 29.07 kg/m2  SpO2 97%  Meds ordered this encounter  Medications  . ondansetron (ZOFRAN-ODT) disintegrating tablet 8 mg    Sig:   . cephALEXin (KEFLEX) 500 MG capsule    Sig: 2 caps po bid x 4 days    Dispense:  16 capsule    Refill:  0    Order Specific Question:  Supervising Provider    Answer:  MILLER,  BRIAN [[id number]]  . ondansetron (ZOFRAN) 8 MG tablet    Sig: Take 1 tablet (8 mg total) by mouth every 8 (eight) hours as needed for nausea or vomiting.    Dispense:  10 tablet    Refill:  0    Order Specific Question:  Supervising Provider    Answer:  Noemi Chapel [[id number]]     Dev Dhondt Camprubi-Soms, PA-C 07/04/15 1414  Blanchie Dessert, MD 07/04/15 1640

## 2015-07-04 NOTE — ED Notes (Signed)
Pt reports being discharged yesterday for tx for UTI, pt presents to ED today c/o dysuria & nausea, pt denies v/d, denies CP & SOB, A&O x4

## 2015-07-05 LAB — URINE CULTURE: Culture: NO GROWTH

## 2015-07-07 ENCOUNTER — Other Ambulatory Visit (HOSPITAL_COMMUNITY)
Admission: AD | Admit: 2015-07-07 | Discharge: 2015-07-07 | Disposition: A | Discharge status: Home / Self Care | Payer: Medicare Other | Source: Other Acute Inpatient Hospital | Attending: Family Medicine | Admitting: Family Medicine

## 2015-07-07 DIAGNOSIS — N390 Urinary tract infection, site not specified: Secondary | ICD-10-CM | POA: Diagnosis present

## 2015-07-07 LAB — URINALYSIS, ROUTINE W REFLEX MICROSCOPIC
GLUCOSE, UA: NEGATIVE mg/dL
HGB URINE DIPSTICK: NEGATIVE
Ketones, ur: NEGATIVE mg/dL
LEUKOCYTES UA: NEGATIVE
Nitrite: NEGATIVE
PROTEIN: NEGATIVE mg/dL
UROBILINOGEN UA: 0.2 mg/dL (ref 0.0–1.0)
pH: 6 (ref 5.0–8.0)

## 2015-07-08 LAB — URINE CULTURE: Culture: NO GROWTH

## 2015-07-10 DIAGNOSIS — R413 Other amnesia: Secondary | ICD-10-CM | POA: Insufficient documentation

## 2015-07-10 DIAGNOSIS — N3946 Mixed incontinence: Secondary | ICD-10-CM

## 2015-07-10 DIAGNOSIS — R54 Age-related physical debility: Secondary | ICD-10-CM | POA: Insufficient documentation

## 2015-07-10 HISTORY — DX: Mixed incontinence: N39.46

## 2015-07-12 ENCOUNTER — Ambulatory Visit: Payer: Medicare Other | Admitting: Emergency Medicine

## 2015-07-15 ENCOUNTER — Encounter: Payer: Self-pay | Admitting: Gastroenterology

## 2015-07-15 ENCOUNTER — Other Ambulatory Visit: Payer: Self-pay | Admitting: Gastroenterology

## 2015-07-15 ENCOUNTER — Ambulatory Visit (INDEPENDENT_AMBULATORY_CARE_PROVIDER_SITE_OTHER): Payer: Medicare Other | Admitting: Gastroenterology

## 2015-07-15 ENCOUNTER — Other Ambulatory Visit (INDEPENDENT_AMBULATORY_CARE_PROVIDER_SITE_OTHER): Payer: Medicare Other

## 2015-07-15 VITALS — BP 120/80 | HR 85 | Ht 65.0 in | Wt 172.0 lb

## 2015-07-15 DIAGNOSIS — R197 Diarrhea, unspecified: Secondary | ICD-10-CM

## 2015-07-15 DIAGNOSIS — I2510 Atherosclerotic heart disease of native coronary artery without angina pectoris: Secondary | ICD-10-CM | POA: Diagnosis not present

## 2015-07-15 DIAGNOSIS — R109 Unspecified abdominal pain: Secondary | ICD-10-CM

## 2015-07-15 DIAGNOSIS — K3184 Gastroparesis: Secondary | ICD-10-CM

## 2015-07-15 LAB — IGA: IGA: 505 mg/dL — AB (ref 68–378)

## 2015-07-15 MED ORDER — RIFAXIMIN 550 MG PO TABS: 550.0000 mg | ORAL_TABLET | Freq: Three times a day (TID) | ORAL | Status: DC

## 2015-07-15 NOTE — Progress Notes (Signed)
HPI :  64 y/o female, former patient of Dr. Deatra Ina here for follow up. She has a history of DM, CAD, oxygen dependant COPD, gastroparesis, and chronic diarrhea.   Patient reports she was in the ER earlier this month. She reports she had a syncopal episode and was fatigued, falling asleep easily. She underwent a cardiac workup which was unremarkable. Echocardiogram showed a normal EF, nuclear stress rest was a low risk probability for ischemia. She otherwise has had some diarrhea, abdominal pain, and weight loss which has been bothering her.   She has diarrhea that bothers her roughly every other day. She reports up to 10 BMs per day or so, liquid stools. No blood in the stools. She thinks this has been ongoing for 2 years or so. She reports explosive diarrhea, with significant urgency with some incontinence if the urge is strong. She has some abdominal cramping with diarrhea and is resolved with a bowel movement. She also has had some upper abdominal discomfort, she has some discomfort usually with her diarrhea. She feels bloated and distended frequently. She thinks her weight had been stable since symptoms began, but lost 12 lbs in the past month or so. She had had a prior colonoscopy in 2014 for these symptoms which was normal and biopsies ruled out microscopic colitis. She has had 2 CT scans of the abdomen in the past 6 months which showed fatty liver but otherwise no pathology to cause her pain.   She has frequent nausea otherwise. She takes zofran for nausea. She does not vomit. She feels full easily when she eats and has significant early satiety. She reports certain foods she tolerates better than others. Meats or foods with significant consistency make her symptoms worse. She has heartburn and reflux symptoms that are poorly controlled. She takes pantoprazole 40mg  twice daily which helps some. She has had a prior positive gastric emptying study in 2007, thought to have gastroparesis related to DM.  The patient has been on reglan previously and felt terrible on it, she reports this occurred within 3 days of starting this medications and "will never try it again".  She has also been tried on Domeperidone from what she describes, she was on it for a month, ordered from [place]. She thinks it helped slightly but not significantly, but could not afford it and does not wish to try this again.  EGD done in 2013 showed a benign GEJ stricture, otherwise normal.   Of note she endorses she has a history of hepatitis C that was never treated, however on last lab work in 2015 for this her HCV viral load was undetectable.    Past Medical History  Diagnosis Date  . Hyperlipidemia   . Benign neoplasm of stomach   . Dizziness and giddiness   . Gastroparesis   . Esophageal reflux   . CAD (coronary artery disease)     a.  cath 2005: LAD 40%;  b. Myoview 3/12: EF 70%, no ischemia;  c. Myoview 9/13: EF 76%, no ischemia  . HTN (hypertension)   . Palpitations   . Depression   . Aortic stenosis     Echo 8/13: EF 55-60%, Mild LVH, mild AS, mean gradient 12 mmHg,   . PONV (postoperative nausea and vomiting)   . TIA (transient ischemic attack) 2015  . Pulmonary embolism ([place])   . Carotid stenosis     right, moderate/notes 07/01/2015  . Esophageal stricture   . Family history of adverse reaction to anesthesia     "  my sister gets sick"  . CHF (congestive heart failure) ([place]) 06-13-12    episode after Cancer surgery  . COPD (chronic obstructive pulmonary disease) ([place])   . On home oxygen therapy     "2L all the time" (07/11/2015)  . Chronic bronchitis ([place])     "get it ~ q yr"  (07/11/2015)  . Sleep apnea 06-13-12    "used to wear CPAP; can't sleep w/it" (07/01/2015) cpap setting of 2 when used cpap  . Type II diabetes mellitus ([place])   . Anemia   . History of blood transfusion     "when I had my hemorrhoid OR"  . Hepatitis C     "still active" (07/01/2015)  . Daily headache   . Migraine     "~ 5  times/wk" (07/01/2015)  . Arthritis     "qwhere; especially in my back and hands" (07/01/2015)  . Fibromyalgia   . Chronic lower back pain   . Anxiety   . Chronic kidney disease     "my kidneys have been about to shut down" (07/01/2015)  . Ovarian cancer ([place]) 2000; 2002     Past Surgical History  Procedure Laterality Date  . Tubal ligation    . Oophorectomy      1 ovary removed, then later last ovary removed  . Anterior and posterior vaginal repair    . Knee arthroscopy Left   . Cataract extraction w/ intraocular lens  implant, bilateral Bilateral 03/2013  . Breast biopsy Left 1972  . Colonoscopy N/A 08/11/2013    Procedure: COLONOSCOPY;  Surgeon: Inda Castle, MD;  Location: [facility];  Service: Endoscopy;  Laterality: N/A;  . Esophagogastroduodenoscopy (egd) with esophageal dilation      [facility] End[facility] 07/01/2015  . Appendectomy    . Cholecystectomy open    . Abdominal hysterectomy    . Band hemorrhoidectomy    . Back surgery    . Lumbar disc surgery    . Dilation and curettage of uterus    . Tonsillectomy  ~ 1958   Family History  Problem Relation Age of Onset  . Brain cancer Father     spine cancer  . Heart disease Mother   . Breast cancer Sister   . Kidney disease Sister   . Diabetes Brother   . Esophageal cancer Brother   . Diabetes Maternal Aunt   . Diabetes Paternal Aunt   . Stomach cancer Paternal Grandfather    Social History  Substance Use Topics  . Smoking status: Never Smoker   . Smokeless tobacco: Never Used  . Alcohol Use: No     Comment: 07/01/2015 "stopped  in before 2000"   Current Outpatient Prescriptions  Medication Sig Dispense Refill  . aspirin 325 MG tablet Take 1 tablet (325 mg total) by mouth daily.    [name] atorvastatin (LIPITOR) 10 MG tablet Take 1 tablet (10 mg total) by mouth daily. 90 tablet 3  . bisoprolol (ZEBETA) 5 MG tablet TAKE ONE TABLET BY MOUTH ONCE DAILY 30 tablet 4  . DULoxetine (CYMBALTA) 30 MG capsule Take 30 mg by mouth 2  (two) times daily.     [name] gabapentin (NEURONTIN) 300 MG capsule Take 1 capsule (300 mg total) by mouth 2 (two) times daily.    [name] gemfibrozil (LOPID) 600 MG tablet Take 600 mg by mouth 2 (two) times daily before a meal.    . glimepiride (AMARYL) 4 MG tablet Take 8 mg by mouth daily with breakfast.     .  JANUVIA 100 MG tablet Take 100 mg by mouth daily.     [name] lisinopril (PRINIVIL,ZESTRIL) 20 MG tablet Take 20 mg by mouth daily.    . metFORMIN (GLUCOPHAGE) 1000 MG tablet Take 1,000 mg by mouth 2 (two) times daily.    . ondansetron (ZOFRAN) 8 MG tablet Take 1 tablet (8 mg total) by mouth every 8 (eight) hours as needed for nausea or vomiting. 10 tablet 0  . OXYGEN-HELIUM IN 2-3 L by Other route continuous.     . pantoprazole (PROTONIX) 40 MG tablet Take 40 mg by mouth 2 (two) times daily before a meal.    . traZODone (DESYREL) 50 MG tablet Take 50 mg by mouth at bedtime as needed for sleep.      No current facility-administered medications for this visit.   Allergies  Allergen Reactions  . Sunflower Oil Anaphylaxis  . Iodine Nausea And Vomiting    Fish causes n/v  . Metoclopramide Hcl Other (See Comments)    Nerves tense up, panic attack  . Sulfamethoxazole Nausea And Vomiting  . Shellfish Allergy Nausea And Vomiting     Review of Systems: All systems reviewed and negative except where noted in HPI.   Imaging and endoscopy review as above  Mild AST elevation to 60-70s, normal ALT  Physical Exam: BP 120/80 mmHg  Pulse 85  Ht 5\' 5"  (1.651 m)  Wt 172 lb (78.019 kg)  BMI 28.62 kg/m2  SpO2 99% Constitutional: Pleasant,well-developed, female in no acute distress. Wearing oxygen HEENT: Normocephalic and atraumatic. Conjunctivae are normal. No scleral icterus. Neck supple.  Cardiovascular: Normal rate, regular rhythm.  Pulmonary/chest: Effort normal but decreased BS B, otherwise clear Abdominal: Soft, nondistended, nontender. Bowel sounds active throughout. There are no masses  palpable.  Extremities: no edema Lymphadenopathy: No cervical adenopathy noted. Neurological: Alert and oriented to person place and time. Skin: Skin is warm and dry. No rashes noted. Psychiatric: Normal mood and affect. Behavior is normal.   ASSESSMENT AND PLAN: 64 y/o female with multiple medical problems as outlined above, presenting with ongoing symptoms of nausea/early satiety/upper abdominal discomfort and with diarrhea. She recently had an admission for a cardiac workup which was negative for ischemic changes.   For her upper symptoms, she has had an extensive evaluation over time to include upper endoscopy, CT imaging, gastric emptying study, barium studies. I suspect the majority of her upper abdominal symptoms are due to gastroparesis related to her underlying diabetes. She is intolerant of reglan and could not afford domperidone, although it appears she did not have significant benefit from it otherwise. I counseled her on gastroparesis diet which I don't think she is following, and gave her information about this. Otherwise, other things to consider moving forward are short courses of erythromycin PRN (long term can develop tachyphylaxis), a trial of a TCA, jejunal feeding tube placement, or gastric pacemaker. I offered her an evaluation at a tertiary care center with a motility specialist to be evaluated for a gastric pacemaker and she wishes to think about it.   She otherwise has ongoing chronic diarrhea. She has had a negative evaluation for microscopic colitis with negative colonoscopy for evaluation for this. I don't see screening for celiac and will send the serology. Will await this result, and in the interim give her a 2 week trial of Rifaxmin 550mg  TID and see how she does. If no improvement will try her on a course of colestid. I counseled her that metformin could also be playing a role  and to try decreasing the dose to see if this helps. We will await her course and follow up as  needed for this issue.   Regarding her hepatitis C status, I counseled her that her lab last year for this showed an undetectable viral load and she has cleared this. Fatty liver noted on [place]. She will need to avoid alcohol and ideally work on weight loss through diet as she cannot exert herself too much. Will monitor, follow up yearly for fatty liver.   Washakie Cellar, MD [facility] Pager [phone]

## 2015-07-15 NOTE — Patient Instructions (Signed)
Your physician has requested that you go to the basement for  lab work before leaving today.  We have sent medications to your pharmacy for you to pick up at your convenience.   

## 2015-07-15 NOTE — Addendum Note (Signed)
Addended by: Damian Leavell S on: 07/15/2015 04:45 PM   Modules accepted: Orders, Medications

## 2015-07-16 ENCOUNTER — Telehealth: Payer: Self-pay | Admitting: Gastroenterology

## 2015-07-16 LAB — TISSUE TRANSGLUTAMINASE, IGA: TISSUE TRANSGLUTAMINASE AB, IGA: 1 U/mL (ref <4)

## 2015-07-16 NOTE — Telephone Encounter (Signed)
Left vm for pt to callback 

## 2015-07-16 NOTE — Telephone Encounter (Signed)
There is unfortunately no alternative to Rifaximin. If we have some samples we could give her a course for a week or so and see if it helps (maybe dosed BID). If we don't have samples, we could try her on a course of Colestid tablets. 1gm tablet BID, to see if this helps. If she tries Colestid she will need to ensure she speaks with the pharmacist about taking this in relation to her other medications, and space it out appropriately. Thanks

## 2015-07-16 NOTE — Telephone Encounter (Signed)
Patient states insurance does not cover rifaximin. Is there an alternative?

## 2015-07-16 NOTE — Telephone Encounter (Signed)
Called [name] [name] and informed her that Dr. Havery Moros wants her to take 1/2 metformin pill twice a day since [name] [name]'s diarrhea is better. She will call Monday with progress.

## 2015-07-19 MED ORDER — PANTOPRAZOLE SODIUM 40 MG PO TBEC: 40.0000 mg | DELAYED_RELEASE_TABLET | Freq: Every day | ORAL | Status: DC

## 2015-07-19 MED ORDER — COLESTIPOL HCL 1 G PO TABS: 1.0000 g | ORAL_TABLET | Freq: Two times a day (BID) | ORAL | Status: DC

## 2015-07-19 NOTE — Telephone Encounter (Signed)
Patient states she is not having the same symptoms. She states she is having confusion, sharp abdominal pains, and gas. She also states her glucose levels are in the 160's when she checks. She wants to know if she can have refills of pantoprazole.

## 2015-07-19 NOTE — Telephone Encounter (Signed)
Called pt and she states diarrhea has started again. I sent Colestid into her pharmacy as well as protonix that she asked for. Pt states she will follow up with PCP to have talk to him about her metformin dosages.

## 2015-07-19 NOTE — Telephone Encounter (Signed)
Patient states that she is still having stomach pain, is shaky, and has a little bit of confusion.

## 2015-07-19 NOTE — Telephone Encounter (Signed)
We can refill her protonix if that is what she thinks would help. I am not sure what to make of her confusion unless her DM is out of control, but sounds as thought glucose levels were stable. I saw her last week regarding a multitude of symptoms. If confusion is a major symptom she is having she should touch base with her primary care for an evaluation. Regarding her abdominal discomfort and gas / bloating I had just evaluated her for this. If she wants an appointment for a tertiary care center for gastroparesis to consider a pacemaker I offered this to her, unless something has intervally changed and she needs another evaluation with [place] in the interim. Thanks

## 2015-07-19 NOTE — Telephone Encounter (Signed)
Patient states she no longer has diarrhea after cutting back on metformin but is now having sharp pains and gas. She wants to know what she should do about this.

## 2015-07-19 NOTE — Telephone Encounter (Signed)
If her symptoms are the same she was reporting when I spoke with her in clinic, I suspect her symptoms are due to gastroparesis and she has had an extensive evaluation for this. I discussed dietary counseling with her previously. She should monitor blood sugar closely while off metformin, as if her glucose levels rise this can make gastroparesis worse. Please ensure she is watching blood sugars closely. Otherwise I previously offered her Rifaximin but she did not want to take it if I recall due to cost. Please ask her to check glucose levels to ensure stable. If rising off metformin she may need to resume it at a lower dose and will need to discuss DM regimen with her primary provider

## 2015-07-20 ENCOUNTER — Telehealth: Payer: Self-pay

## 2015-07-20 NOTE — Telephone Encounter (Signed)
Pt called and wanted to know why colestipol and Xifaxan was called into her pharmacy. I informed her that Doreene Nest was not approved by her insurance at first and the alternative was called into her pharmacy. We then received an approval letter for Xifaxan andw as instructed to not take the Colestipol (epspecially since she called Monday and stated she no longer had diarrhea). Patient understands.

## 2015-07-20 NOTE — Telephone Encounter (Signed)
Called pt and informed her that Xifaxan was approved.

## 2015-07-21 ENCOUNTER — Telehealth: Payer: Self-pay | Admitting: Gastroenterology

## 2015-08-01 ENCOUNTER — Other Ambulatory Visit: Payer: Self-pay | Admitting: Gastroenterology

## 2015-08-04 ENCOUNTER — Telehealth: Payer: Self-pay | Admitting: Gastroenterology

## 2015-08-04 NOTE — Telephone Encounter (Signed)
Called pt and explained that she was to take Xifaxan for only 14 days and after that no refills. Pt understands

## 2015-08-05 ENCOUNTER — Other Ambulatory Visit (HOSPITAL_COMMUNITY)
Admission: RE | Admit: 2015-08-05 | Discharge: 2015-08-05 | Disposition: A | Discharge status: Home / Self Care | Payer: Medicare Other | Source: Other Acute Inpatient Hospital | Attending: Family Medicine | Admitting: Family Medicine

## 2015-08-05 DIAGNOSIS — E119 Type 2 diabetes mellitus without complications: Secondary | ICD-10-CM | POA: Diagnosis present

## 2015-08-05 LAB — CK: Total CK: 71 U/L (ref 38–234)

## 2015-08-24 ENCOUNTER — Telehealth: Payer: Self-pay | Admitting: Gastroenterology

## 2015-08-24 DIAGNOSIS — F0781 Postconcussional syndrome: Secondary | ICD-10-CM | POA: Insufficient documentation

## 2015-08-24 NOTE — Telephone Encounter (Signed)
Patient calls in regards to her chronic diarrhea. She finished a 14 day course of Xifaxan. The diarrhea returned 3 to 4 days ago. She has up to 10 small diarrhea stools a day, but has has not had any bowel movements toady. She also has nausea.Do you want tol try her on a course of colestid? The celiac panel was negative. Thank you

## 2015-08-24 NOTE — Telephone Encounter (Signed)
It sounds like the rifaximin did provide some benefit, and then symptoms recurred, is that correct? She want to see how she does over the next week if she has not had any further loose stools today, however a trial of Colestid is fine to see if this helps. If she had benefit from rifaximin we can try another course of it in the future as well. Thanks

## 2015-08-25 NOTE — Telephone Encounter (Signed)
Options offered to the patient. She reports she did have diarrhea stools yesterday. She will try the Colestid because she already has a bottle of it. Instructions written down by patient. She will call if she acutely worsens or fails to improve.

## 2015-08-26 ENCOUNTER — Telehealth: Payer: Self-pay | Admitting: Gastroenterology

## 2015-08-26 MED ORDER — COLESTIPOL HCL 1 G PO TABS: 1.0000 g | ORAL_TABLET | Freq: Two times a day (BID) | ORAL | Status: DC

## 2015-08-26 NOTE — Telephone Encounter (Signed)
Would you like me to resend this?

## 2015-08-26 NOTE — Telephone Encounter (Signed)
Yes that's fine. Thanks  

## 2015-09-15 ENCOUNTER — Encounter: Payer: Self-pay | Admitting: *Deleted

## 2015-09-15 ENCOUNTER — Ambulatory Visit (INDEPENDENT_AMBULATORY_CARE_PROVIDER_SITE_OTHER): Payer: Medicare Other | Admitting: Neurology

## 2015-09-15 ENCOUNTER — Encounter: Payer: Self-pay | Admitting: Neurology

## 2015-09-15 VITALS — BP 159/85 | HR 88 | Ht 66.0 in | Wt 176.8 lb

## 2015-09-15 DIAGNOSIS — R0683 Snoring: Secondary | ICD-10-CM | POA: Diagnosis not present

## 2015-09-15 DIAGNOSIS — G4733 Obstructive sleep apnea (adult) (pediatric): Secondary | ICD-10-CM

## 2015-09-15 DIAGNOSIS — F05 Delirium due to known physiological condition: Secondary | ICD-10-CM

## 2015-09-15 DIAGNOSIS — R410 Disorientation, unspecified: Secondary | ICD-10-CM

## 2015-09-15 DIAGNOSIS — R519 Headache, unspecified: Secondary | ICD-10-CM

## 2015-09-15 DIAGNOSIS — G4719 Other hypersomnia: Secondary | ICD-10-CM | POA: Diagnosis not present

## 2015-09-15 DIAGNOSIS — R51 Headache: Secondary | ICD-10-CM | POA: Diagnosis not present

## 2015-09-15 NOTE — Patient Instructions (Signed)
Remember to drink plenty of fluid, eat healthy meals and do not skip any meals. Try to eat protein with a every meal and eat a healthy snack such as fruit or nuts in between meals. Try to keep a regular sleep-wake schedule and try to exercise daily, particularly in the form of walking, 20-30 minutes a day, if you can.   As far as diagnostic testing: sleep evaluation, MRI of the brain  Our phone number is [phone]. We also have an after hours call service for urgent matters and there is a physician on-call for urgent questions. For any emergencies you know to call 911 or go to the nearest emergency room
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
## 2015-09-16 ENCOUNTER — Telehealth: Payer: Self-pay | Admitting: Neurology

## 2015-09-16 NOTE — Telephone Encounter (Signed)
[name] [name] or [name] - Would one of you please call patient and cancel this patient's appointment with Dr. Brett Fairy on Monday. I looked through all our records. Dr. Brett Fairy is not her sleep doctor and did not perform her sleep test.. Tell patient she needs to follow up with the sleep doctor she saw in the past (or possibly pulmonary did her sleep test?) for her cpap difficulties. I see that an order has been placed by her primary care for DME for her cpap. She doesn't need to see Dr. Brett Fairy. Thanks.

## 2015-09-16 NOTE — Telephone Encounter (Signed)
error 

## 2015-09-16 NOTE — Telephone Encounter (Signed)
Call patient's office and see who her sleep doctor is

## 2015-09-17 NOTE — Telephone Encounter (Signed)
I called the patient. I explained Dr. Cathren Laine message to her and she verbalized understanding. Appointment has been canceled.

## 2015-09-17 NOTE — Telephone Encounter (Signed)
Thank you :)

## 2015-09-20 ENCOUNTER — Institutional Professional Consult (permissible substitution): Payer: Medicare Other | Admitting: Neurology

## 2015-09-22 ENCOUNTER — Other Ambulatory Visit: Payer: Medicare Other

## 2015-09-25 ENCOUNTER — Ambulatory Visit
Admission: RE | Admit: 2015-09-25 | Discharge: 2015-09-25 | Disposition: A | Discharge status: Home / Self Care | Payer: Medicare Other | Source: Ambulatory Visit | Attending: Neurology | Admitting: Neurology

## 2015-09-25 DIAGNOSIS — R410 Disorientation, unspecified: Secondary | ICD-10-CM

## 2015-09-25 DIAGNOSIS — F05 Delirium due to known physiological condition: Secondary | ICD-10-CM

## 2015-09-25 DIAGNOSIS — R51 Headache: Principal | ICD-10-CM

## 2015-09-25 DIAGNOSIS — G4719 Other hypersomnia: Secondary | ICD-10-CM

## 2015-09-25 DIAGNOSIS — R519 Headache, unspecified: Secondary | ICD-10-CM

## 2015-09-27 ENCOUNTER — Telehealth: Payer: Self-pay | Admitting: *Deleted

## 2015-09-27 NOTE — Telephone Encounter (Signed)
Spoke to pt about unremarkable MRI brain per Dr Jaynee Eagles. Nothing concerning. No changes since 2015. She needs to make sure she f/u about her CPAP and who helps manage that. She verbalized understanding.

## 2015-09-27 NOTE — Telephone Encounter (Signed)
-----   Message from Melvenia Beam, MD sent at 09/27/2015  1:07 PM EST ----- Unremarkable MRI of the brain. No changes since 2015. Nothing concerning.

## 2015-10-05 ENCOUNTER — Other Ambulatory Visit: Payer: Self-pay | Admitting: Gastroenterology

## 2015-10-10 ENCOUNTER — Other Ambulatory Visit: Payer: Self-pay | Admitting: Gastroenterology

## 2015-10-10 ENCOUNTER — Other Ambulatory Visit: Payer: Self-pay | Admitting: Internal Medicine

## 2015-10-22 ENCOUNTER — Other Ambulatory Visit: Payer: Self-pay | Admitting: Gastroenterology

## 2015-10-26 ENCOUNTER — Other Ambulatory Visit: Payer: Self-pay | Admitting: Gastroenterology

## 2015-11-04 ENCOUNTER — Ambulatory Visit: Payer: Medicare Other | Admitting: Gastroenterology

## 2015-11-09 ENCOUNTER — Telehealth: Payer: Self-pay | Admitting: Gastroenterology

## 2015-11-09 NOTE — Telephone Encounter (Signed)
I have tried her on Rifaximin and colestid since her last visit. Has she had any benefit with these? Otherwise, has she tried decreasing her metformin dose to see if this helps. She can take some immodium if she hasn't tried taking this daily yet to see if it helps and I will reassess her in the clinic to discuss other options. Thanks

## 2015-11-10 NOTE — Telephone Encounter (Signed)
Left a message for patient to call back. 

## 2015-11-10 NOTE — Telephone Encounter (Signed)
Sounds good. Thanks 

## 2015-11-10 NOTE — Telephone Encounter (Signed)
Spoke with patient and she states the Rifaximin helped but he Colestid did not help. She has not decreased the Metformin because her MD did not want her too. She will call them again. She will try Imodium daily.

## 2015-11-18 ENCOUNTER — Ambulatory Visit (INDEPENDENT_AMBULATORY_CARE_PROVIDER_SITE_OTHER): Payer: Medicare Other | Admitting: Gastroenterology

## 2015-11-18 ENCOUNTER — Encounter: Payer: Self-pay | Admitting: Gastroenterology

## 2015-11-18 VITALS — BP 130/80 | HR 91 | Ht 66.0 in | Wt 179.0 lb

## 2015-11-18 DIAGNOSIS — K3184 Gastroparesis: Secondary | ICD-10-CM

## 2015-11-18 DIAGNOSIS — K529 Noninfective gastroenteritis and colitis, unspecified: Secondary | ICD-10-CM

## 2015-11-18 DIAGNOSIS — D649 Anemia, unspecified: Secondary | ICD-10-CM | POA: Diagnosis not present

## 2015-11-18 DIAGNOSIS — K644 Residual hemorrhoidal skin tags: Secondary | ICD-10-CM | POA: Diagnosis not present

## 2015-11-18 DIAGNOSIS — R195 Other fecal abnormalities: Secondary | ICD-10-CM

## 2015-11-18 NOTE — Patient Instructions (Addendum)
We have scheduled your appointment with D[facility]atology specialists with [facility] Abbot, PA on 11/26/15 at 9:15am. Please bring your insurance cards and co-pay with you to the appointment. The address is [address] (bul[place] beside [place]).

## 2015-11-18 NOTE — Progress Notes (Signed)
HPI :  LAST VISIT 65 y/o female, former patient of Dr. Deatra Ina here for follow up. She has a history of DM, CAD, oxygen dependant COPD, gastroparesis, and chronic diarrhea.   Patient reports she was in the ER earlier this month. She reports she had a syncopal episode and was fatigued, falling asleep easily. She underwent a cardiac workup which was unremarkable. Echocardiogram showed a normal EF, nuclear stress rest was a low risk probability for ischemia. She otherwise has had some diarrhea, abdominal pain, and weight loss which has been bothering her.   She has diarrhea that bothers her roughly every other day. She reports up to 10 BMs per day or so, liquid stools. No blood in the stools. She thinks this has been ongoing for 2 years or so. She reports explosive diarrhea, with significant urgency with some incontinence if the urge is strong. She has some abdominal cramping with diarrhea and is resolved with a bowel movement. She also has had some upper abdominal discomfort, she has some discomfort usually with her diarrhea. She feels bloated and distended frequently. She thinks her weight had been stable since symptoms began, but lost 12 lbs in the past month or so. She had had a prior colonoscopy in 2014 for these symptoms which was normal and biopsies ruled out microscopic colitis. She has had 2 CT scans of the abdomen in the past 6 months which showed fatty liver but otherwise no pathology to cause her pain.   She has frequent nausea otherwise. She takes zofran for nausea. She does not vomit. She feels full easily when she eats and has significant early satiety. She reports certain foods she tolerates better than others. Meats or foods with significant consistency make her symptoms worse. She has heartburn and reflux symptoms that are poorly controlled. She takes pantoprazole 40mg  twice daily which helps some. She has had a prior positive gastric emptying study in 2007, thought to have gastroparesis  related to DM. The patient has been on reglan previously and felt terrible on it, she reports this occurred within 3 days of starting this medications and "will never try it again". She has also been tried on Domeperidone from what she describes, she was on it for a month, ordered from [place]. She thinks it helped slightly but not significantly, but could not afford it and does not wish to try this again. EGD done in 2013 showed a benign GEJ stricture, otherwise normal.   Of note she endorses she has a history of hepatitis C that was never treated, however on last lab work in 2015 for this her HCV viral load was undetectable  SINCE LAST VISIT:  She is here for follow up for chronic diarrhea and gastroparesis. She is also here at the request of her primary care for positive FOBT testing.   For her diarrhea she has been taking immodium one tablet per day, sometimes more if needed. She has been taking colestipol twice daily as well. She is having one BM per day on this regimen. She reports it is solid stools for the most part. She was previously having upwards of 10 BMs per day at the last visit. She was given a course of rifaximin since the last visit. Notes report this benefitted her for a period of time but she states today she does not remember how it affected her. She denies any overt blood in the stools. She has also since stopped her metformin which she thnks has also helped her diarrhea.  She reports she tested positive for occult blood recently done by her PCM. Records obtained showing 1 out of 3 positive for occult blood. She had a colonoscopy in 2014 which showed a normal colon, told to follow up in 10 years. EGD done in 2013 showing a benign esophageal stricture, otherwise normal.   In regards to her gastroparesis, these symptoms are stable. She has chronic nausea and early satiety from her gastroparesis. Prior trial of Reglan did not help and was poorly tolerated. She thinks domperidone  helped her previously to some extent although she used it for only a few months and thought it was expensive. She thinks she is gaining weight, no weight loss, and is eating okay in general. No significant vomiting. She doesn't think these symptoms bother her significantly and wishes to avoid taking anything for this further.     She otherwise endorses "knots in her perianal area" for about week or so. She thinks it can cause discomfort or become irritated. She thinks it is there all the time.         Past Medical History  Diagnosis Date  . Hyperlipidemia   . Benign neoplasm of stomach   . Dizziness and giddiness   . Gastroparesis   . Esophageal reflux   . CAD (coronary artery disease)     a.  cath 2005: LAD 40%;  b. Myoview 3/12: EF 70%, no ischemia;  c. Myoview 9/13: EF 76%, no ischemia  . HTN (hypertension)   . Palpitations   . Depression   . Aortic stenosis     Echo 8/13: EF 55-60%, Mild LVH, mild AS, mean gradient 12 mmHg,   . PONV (postoperative nausea and vomiting)   . TIA (transient ischemic attack) 2015  . Pulmonary embolism ([place])   . Carotid stenosis     right, moderate/notes 07/01/2015  . Esophageal stricture   . Family history of adverse reaction to anesthesia     "my sister gets sick"  . CHF (congestive heart failure) ([place]) 06-13-12    episode after Cancer surgery  . COPD (chronic obstructive pulmonary disease) ([place])   . On home oxygen therapy     "2L all the time" (07/11/2015)  . Chronic bronchitis ([place])     "get it ~ q yr"  (07/11/2015)  . Sleep apnea 06-13-12    "used to wear CPAP; can't sleep w/it" (07/01/2015) cpap setting of 2 when used cpap  . Type II diabetes mellitus ([place])   . Anemia   . History of blood transfusion     "when I had my hemorrhoid OR"  . Hepatitis C     "still active" (07/01/2015)  . Daily headache   . Migraine     "~ 5 times/wk" (07/01/2015)  . Arthritis     "qwhere; especially in my back and hands" (07/01/2015)  . Fibromyalgia   .  Chronic lower back pain   . Anxiety   . Chronic kidney disease     "my kidneys have been about to shut down" (07/01/2015)  . Ovarian cancer ([place]) 2000; 2002  . Chronic headaches      Past Surgical History  Procedure Laterality Date  . Tubal ligation    . Oophorectomy      1 ovary removed, then later last ovary removed  . Anterior and posterior vaginal repair    . Knee arthroscopy Left   . Cataract extraction w/ intraocular lens  implant, bilateral Bilateral 03/2013  . Breast biopsy Left 1972  .  Colonoscopy N/A 08/11/2013    Procedure: COLONOSCOPY;  Surgeon: Inda Castle, MD;  Location: [facility];  Service: Endoscopy;  Laterality: N/A;  . Esophagogastroduodenoscopy (egd) with esophageal dilation      [facility] 07/01/2015  . Appendectomy    . Cholecystectomy open    . Abdominal hysterectomy    . Band hemorrhoidectomy    . Back surgery    . Lumbar disc surgery    . Dilation and curettage of uterus    . Tonsillectomy  ~ 1958   Family History  Problem Relation Age of Onset  . Brain cancer Father     spine cancer  . Heart disease Mother   . Breast cancer Sister   . Kidney disease Sister   . Diabetes Brother   . Esophageal cancer Brother   . Diabetes Maternal Aunt   . Diabetes Paternal Aunt   . Stomach cancer Paternal Grandfather    Social History  Substance Use Topics  . Smoking status: Never Smoker   . Smokeless tobacco: Never Used  . Alcohol Use: No     Comment: 07/01/2015 "stopped  in before 2000"   Current Outpatient Prescriptions  Medication Sig Dispense Refill  . aspirin 325 MG tablet Take 1 tablet (325 mg total) by mouth daily.    [name] atorvastatin (LIPITOR) 10 MG tablet Take 1 tablet (10 mg total) by mouth daily. 90 tablet 3  . azelastine (ASTELIN) 0.1 % nasal spray 2 sprays each nostril four times daily for 3-5 days then twice a day    . B-D ULTRA-FINE 33 LANCETS MISC TEST BLOOD GLUCOSE 2 x daily    . bisoprolol (ZEBETA) 5 MG tablet TAKE ONE TABLET BY MOUTH  ONCE DAILY 30 tablet 6  . colestipol (COLESTID) 1 g tablet TAKE ONE TABLET BY MOUTH TWICE DAILY 60 tablet 0  . cycloSPORINE (RESTASIS) 0.05 % ophthalmic emulsion 1 drop.    . DULoxetine (CYMBALTA) 30 MG capsule Take 30 mg by mouth 2 (two) times daily.     [name] gabapentin (NEURONTIN) 300 MG capsule Take 1 capsule (300 mg total) by mouth 2 (two) times daily.    [name] gemfibrozil (LOPID) 600 MG tablet Take 600 mg by mouth 2 (two) times daily before a meal.    . glimepiride (AMARYL) 4 MG tablet Take 8 mg by mouth daily with breakfast.     . Insulin Pen Needle 31G X 6 MM MISC Use as directed    . JANUVIA 100 MG tablet Take 100 mg by mouth daily.     [name] lisinopril (PRINIVIL,ZESTRIL) 20 MG tablet Take 20 mg by mouth daily.    . metFORMIN (GLUCOPHAGE) 1000 MG tablet Take 1,000 mg by mouth 2 (two) times daily.    . ondansetron (ZOFRAN) 8 MG tablet Take 1 tablet (8 mg total) by mouth every 8 (eight) hours as needed for nausea or vomiting. 10 tablet 0  . ondansetron (ZOFRAN) 8 MG tablet Take 8 mg by mouth.    . OXYGEN-HELIUM IN 2-3 L by Other route continuous.     . pantoprazole (PROTONIX) 40 MG tablet TAKE ONE TABLET BY MOUTH ONCE DAILY 30 tablet 0  . rifaximin (XIFAXAN) 550 MG TABS tablet Take 1 tablet (550 mg total) by mouth 3 (three) times daily. 42 tablet 0  . solifenacin (VESICARE) 5 MG tablet Take 5 mg by mouth.    . traZODone (DESYREL) 50 MG tablet Take 50 mg by mouth at bedtime as needed for sleep.  No current facility-administered medications for this visit.   Allergies  Allergen Reactions  . Other Shortness Of Breath and Swelling  . Sunflower Oil Anaphylaxis  . Iodine Nausea And Vomiting    Fish causes n/v  . Metoclopramide Hcl Other (See Comments)    Nerves tense up, panic attack  . Sulfamethoxazole Nausea And Vomiting  . Shellfish Allergy Nausea And Vomiting     Review of Systems: All systems reviewed and negative except where noted in HPI.   Lab Results  Component Value Date    WBC 4.7 07/03/2015   HGB 10.2* 07/03/2015   HCT 32.2* 07/03/2015   MCV 89.2 07/03/2015   PLT 165 07/03/2015    Lab Results  Component Value Date   CREATININE 0.81 07/03/2015   BUN 7 07/03/2015   NA 139 07/03/2015   K 4.5 07/03/2015   CL 105 07/03/2015   CO2 25 07/03/2015      Physical Exam: BP 130/80 mmHg  Pulse 91  Ht 5\' 6"  (1.676 m)  Wt 179 lb (81.194 kg)  BMI 28.91 kg/m2 Constitutional: Pleasant,well-developed, female in no acute distress. HEENT: Normocephalic and atraumatic. Conjunctivae are normal. No scleral icterus. Neck supple.  Cardiovascular: Normal rate, regular rhythm.  Pulmonary/chest: Effort normal and breath sounds normal. No wheezing, rales or rhonchi. Abdominal: Soft, nondistended, nontender. Bowel sounds active throughout. There are no masses palpable.  Rectal exam / Anoscopy - internal hemorrhoids, no nodules or polyps, no mass lesions. Externally with superficial skin tags noted noted in the perianal area vs. Less likely condyloma Extremities: no edema Lymphadenopathy: No cervical adenopathy noted. Neurological: Alert and oriented to person place and time. Skin: Skin is warm and dry. No rashes noted. Psychiatric: Normal mood and affect. Behavior is normal.   ASSESSMENT AND PLAN: 65 y/o female with multiple medical problems as outlined above, presenting for follow up for the issues as outlined below:  Chronic diarrhea - she has had a negative evaluation for microscopic colitis with negative colonoscopy for evaluation for this. Celiac labs negative. I think likely functional or perhaps medication induce from metformin. She has stopped metformin with improvement, using immodium once daily with colestid BID. I would continue this regimen now, working much better for her, previously she had more than 10 BMs per day. She can follow up as needed for this issue.  Gastroparesis - intolerant to Reglan, she thinks domperidone may have provided her benefit but  could not afford it. Symptoms are mild and stable at this time, in fact she is gaining weight. She is not interested in medical therapy at this time. She will monitor symptoms, try to follow gastroparesis diet, eat smaller more frequent meals. Follow up as needed.   (+) FOBT - she has had a chronic normocytic anemia for the past 8 months which seems new. 1/3 cards positive for OB. She has had a relatively recent normal colonoscopy in 2014 and EGD in 2013. She has no overt symptoms. I will send repeat CBC with iron studies, B12/folate, TSH to further workup anemia. If iron deficient we will need to proceed with endoscopy. If normal iron stores suspect it less likely that GI bleeding is causing her anemia. 1/3 positive FOBT could be false positive but will further discuss with patient if she wants to proceed with endoscopic evaluation. I think this may be of low yield given relatively recent exams.   Perianal lesions - suspect benign skin tags vs less likely condyloma. Small and superfical. No internal lesions noted on anoscopy.  Will refer to dermatology for possible removal as she is bothered by them. Will await this consultation.   Chesterfield Cellar, MD [facility] Pager [phone]  CC: Vicenta Aly, [place]

## 2015-11-19 ENCOUNTER — Other Ambulatory Visit (INDEPENDENT_AMBULATORY_CARE_PROVIDER_SITE_OTHER): Payer: Medicare Other

## 2015-11-19 DIAGNOSIS — D649 Anemia, unspecified: Secondary | ICD-10-CM

## 2015-11-19 LAB — CBC WITH DIFFERENTIAL/PLATELET
Basophils Absolute: 0 10*3/uL (ref 0.0–0.1)
Basophils Relative: 0.5 % (ref 0.0–3.0)
EOS PCT: 4.8 % (ref 0.0–5.0)
Eosinophils Absolute: 0.2 10*3/uL (ref 0.0–0.7)
HCT: 31.2 % — ABNORMAL LOW (ref 36.0–46.0)
Hemoglobin: 10.6 g/dL — ABNORMAL LOW (ref 12.0–15.0)
LYMPHS ABS: 1.2 10*3/uL (ref 0.7–4.0)
Lymphocytes Relative: 27.2 % (ref 12.0–46.0)
MCHC: 34.1 g/dL (ref 30.0–36.0)
MCV: 85.1 fl (ref 78.0–100.0)
MONOS PCT: 7.6 % (ref 3.0–12.0)
Monocytes Absolute: 0.3 10*3/uL (ref 0.1–1.0)
NEUTROS ABS: 2.7 10*3/uL (ref 1.4–7.7)
NEUTROS PCT: 59.9 % (ref 43.0–77.0)
Platelets: 204 10*3/uL (ref 150.0–400.0)
RBC: 3.67 Mil/uL — AB (ref 3.87–5.11)
RDW: 14.1 % (ref 11.5–15.5)
WBC: 4.6 10*3/uL (ref 4.0–10.5)

## 2015-11-19 LAB — FOLATE

## 2015-11-19 LAB — VITAMIN B12: VITAMIN B 12: 362 pg/mL (ref 211–911)

## 2015-11-19 LAB — IBC PANEL
IRON: 91 ug/dL (ref 42–145)
Saturation Ratios: 20.6 % (ref 20.0–50.0)
Transferrin: 315 mg/dL (ref 212.0–360.0)

## 2015-11-19 LAB — TSH: TSH: 0.59 u[IU]/mL (ref 0.35–4.50)

## 2015-11-19 LAB — FERRITIN: FERRITIN: 69.4 ng/mL (ref 10.0–291.0)

## 2015-11-23 ENCOUNTER — Other Ambulatory Visit: Payer: Self-pay | Admitting: *Deleted

## 2015-11-23 DIAGNOSIS — K921 Melena: Secondary | ICD-10-CM

## 2015-11-26 ENCOUNTER — Other Ambulatory Visit: Payer: Self-pay | Admitting: Gastroenterology

## 2015-11-29 ENCOUNTER — Encounter (HOSPITAL_COMMUNITY): Payer: Self-pay | Admitting: *Deleted

## 2015-12-01 ENCOUNTER — Other Ambulatory Visit: Payer: Self-pay | Admitting: Gastroenterology

## 2015-12-02 ENCOUNTER — Ambulatory Visit (AMBULATORY_SURGERY_CENTER): Payer: Self-pay

## 2015-12-02 VITALS — Ht 66.0 in | Wt 181.8 lb

## 2015-12-02 DIAGNOSIS — Z860100 Personal history of colon polyps, unspecified: Secondary | ICD-10-CM

## 2015-12-02 MED ORDER — SUPREP BOWEL PREP KIT 17.5-3.13-1.6 GM/177ML PO SOLN: 1.0000 | Freq: Once | ORAL | Status: DC

## 2015-12-02 NOTE — Progress Notes (Signed)
No allergies to eggs or soy No past problems with anesthesia PONV No diet/weight loss meds On O2!!!  No internet

## 2015-12-08 ENCOUNTER — Encounter (HOSPITAL_COMMUNITY)
Admission: RE | Disposition: A | Discharge status: Home / Self Care | Payer: Self-pay | Source: Ambulatory Visit | Attending: Gastroenterology

## 2015-12-08 ENCOUNTER — Ambulatory Visit (HOSPITAL_COMMUNITY): Payer: Medicare Other | Admitting: Anesthesiology

## 2015-12-08 ENCOUNTER — Encounter (HOSPITAL_COMMUNITY): Payer: Self-pay

## 2015-12-08 ENCOUNTER — Ambulatory Visit (HOSPITAL_COMMUNITY)
Admission: RE | Admit: 2015-12-08 | Discharge: 2015-12-08 | Disposition: A | Discharge status: Home / Self Care | Payer: Medicare Other | Source: Ambulatory Visit | Attending: Gastroenterology | Admitting: Gastroenterology

## 2015-12-08 DIAGNOSIS — E1122 Type 2 diabetes mellitus with diabetic chronic kidney disease: Secondary | ICD-10-CM | POA: Diagnosis not present

## 2015-12-08 DIAGNOSIS — I350 Nonrheumatic aortic (valve) stenosis: Secondary | ICD-10-CM | POA: Diagnosis not present

## 2015-12-08 DIAGNOSIS — K644 Residual hemorrhoidal skin tags: Secondary | ICD-10-CM | POA: Insufficient documentation

## 2015-12-08 DIAGNOSIS — Z9981 Dependence on supplemental oxygen: Secondary | ICD-10-CM | POA: Diagnosis not present

## 2015-12-08 DIAGNOSIS — I129 Hypertensive chronic kidney disease with stage 1 through stage 4 chronic kidney disease, or unspecified chronic kidney disease: Secondary | ICD-10-CM | POA: Diagnosis not present

## 2015-12-08 DIAGNOSIS — Z794 Long term (current) use of insulin: Secondary | ICD-10-CM | POA: Diagnosis not present

## 2015-12-08 DIAGNOSIS — K648 Other hemorrhoids: Secondary | ICD-10-CM | POA: Diagnosis not present

## 2015-12-08 DIAGNOSIS — M797 Fibromyalgia: Secondary | ICD-10-CM | POA: Diagnosis not present

## 2015-12-08 DIAGNOSIS — E1151 Type 2 diabetes mellitus with diabetic peripheral angiopathy without gangrene: Secondary | ICD-10-CM | POA: Insufficient documentation

## 2015-12-08 DIAGNOSIS — Z8673 Personal history of transient ischemic attack (TIA), and cerebral infarction without residual deficits: Secondary | ICD-10-CM | POA: Insufficient documentation

## 2015-12-08 DIAGNOSIS — E785 Hyperlipidemia, unspecified: Secondary | ICD-10-CM | POA: Insufficient documentation

## 2015-12-08 DIAGNOSIS — K6289 Other specified diseases of anus and rectum: Secondary | ICD-10-CM

## 2015-12-08 DIAGNOSIS — K921 Melena: Secondary | ICD-10-CM

## 2015-12-08 DIAGNOSIS — Z86711 Personal history of pulmonary embolism: Secondary | ICD-10-CM | POA: Diagnosis not present

## 2015-12-08 DIAGNOSIS — G4730 Sleep apnea, unspecified: Secondary | ICD-10-CM | POA: Diagnosis not present

## 2015-12-08 DIAGNOSIS — Z8543 Personal history of malignant neoplasm of ovary: Secondary | ICD-10-CM | POA: Insufficient documentation

## 2015-12-08 DIAGNOSIS — K529 Noninfective gastroenteritis and colitis, unspecified: Secondary | ICD-10-CM | POA: Diagnosis not present

## 2015-12-08 DIAGNOSIS — Z79899 Other long term (current) drug therapy: Secondary | ICD-10-CM | POA: Diagnosis not present

## 2015-12-08 DIAGNOSIS — R6881 Early satiety: Secondary | ICD-10-CM | POA: Diagnosis not present

## 2015-12-08 DIAGNOSIS — J449 Chronic obstructive pulmonary disease, unspecified: Secondary | ICD-10-CM | POA: Insufficient documentation

## 2015-12-08 DIAGNOSIS — K3184 Gastroparesis: Secondary | ICD-10-CM | POA: Diagnosis not present

## 2015-12-08 DIAGNOSIS — K760 Fatty (change of) liver, not elsewhere classified: Secondary | ICD-10-CM | POA: Insufficient documentation

## 2015-12-08 DIAGNOSIS — K219 Gastro-esophageal reflux disease without esophagitis: Secondary | ICD-10-CM | POA: Diagnosis not present

## 2015-12-08 DIAGNOSIS — Z9049 Acquired absence of other specified parts of digestive tract: Secondary | ICD-10-CM | POA: Insufficient documentation

## 2015-12-08 DIAGNOSIS — R195 Other fecal abnormalities: Secondary | ICD-10-CM | POA: Insufficient documentation

## 2015-12-08 DIAGNOSIS — I2510 Atherosclerotic heart disease of native coronary artery without angina pectoris: Secondary | ICD-10-CM | POA: Insufficient documentation

## 2015-12-08 DIAGNOSIS — D649 Anemia, unspecified: Secondary | ICD-10-CM | POA: Diagnosis not present

## 2015-12-08 DIAGNOSIS — Z90722 Acquired absence of ovaries, bilateral: Secondary | ICD-10-CM | POA: Diagnosis not present

## 2015-12-08 DIAGNOSIS — E1143 Type 2 diabetes mellitus with diabetic autonomic (poly)neuropathy: Secondary | ICD-10-CM | POA: Insufficient documentation

## 2015-12-08 DIAGNOSIS — N189 Chronic kidney disease, unspecified: Secondary | ICD-10-CM | POA: Insufficient documentation

## 2015-12-08 HISTORY — PX: COLONOSCOPY: SHX5424

## 2015-12-08 LAB — GLUCOSE, CAPILLARY: Glucose-Capillary: 123 mg/dL — ABNORMAL HIGH (ref 65–99)

## 2015-12-08 SURGERY — COLONOSCOPY
Anesthesia: Monitor Anesthesia Care

## 2015-12-08 MED ORDER — PROPOFOL 10 MG/ML IV BOLUS
INTRAVENOUS | Status: AC
  Filled 2015-12-08: qty 20

## 2015-12-08 MED ORDER — ONDANSETRON HCL 4 MG/2ML IJ SOLN
INTRAMUSCULAR | Status: DC | PRN
  Administered 2015-12-08: 4 mg via INTRAVENOUS

## 2015-12-08 MED ORDER — PROPOFOL 10 MG/ML IV BOLUS
INTRAVENOUS | Status: DC | PRN
  Administered 2015-12-08 (×3): 20 mg via INTRAVENOUS

## 2015-12-08 MED ORDER — PROPOFOL 500 MG/50ML IV EMUL
INTRAVENOUS | Status: DC | PRN
  Administered 2015-12-08: 120 ug/kg/min via INTRAVENOUS

## 2015-12-08 MED ORDER — SODIUM CHLORIDE 0.9 % IV SOLN: INTRAVENOUS | Status: DC

## 2015-12-08 MED ORDER — LACTATED RINGERS IV SOLN
INTRAVENOUS | Status: DC | PRN
  Administered 2015-12-08: 12:00:00 via INTRAVENOUS

## 2015-12-08 MED ORDER — PROPOFOL 10 MG/ML IV BOLUS
INTRAVENOUS | Status: AC
  Filled 2015-12-08: qty 60

## 2015-12-08 MED ORDER — LIDOCAINE HCL (CARDIAC) 20 MG/ML IV SOLN
INTRAVENOUS | Status: DC | PRN
  Administered 2015-12-08: 25 mg via INTRATRACHEAL

## 2015-12-08 MED ORDER — ONDANSETRON HCL 4 MG/2ML IJ SOLN
INTRAMUSCULAR | Status: AC
  Filled 2015-12-08: qty 2

## 2015-12-08 NOTE — Discharge Instructions (Signed)

## 2015-12-08 NOTE — Transfer of Care (Signed)
Immediate Anesthesia Transfer of Care Note  Patient: [name] [name]  Procedure(s) Performed: Procedure(s): COLONOSCOPY (N/A)  Patient Location: PACU and Endoscopy Unit  Anesthesia Type:MAC  Level of Consciousness: awake and patient cooperative  Airway & Oxygen Therapy: Patient Spontanous Breathing and Patient connected to face mask oxygen  Post-op Assessment: Report given to RN and Post -op Vital signs reviewed and stable  Post vital signs: Reviewed and stable  Last Vitals:  Filed Vitals:   12/08/15 1223  BP: 150/92  Pulse: 82  Temp: 36.8 C  Resp: 16    Complications: No apparent anesthesia complications

## 2015-12-08 NOTE — H&P (View-Only) (Signed)
HPI :  LAST VISIT 65 y/o female, former patient of Dr. Deatra Ina here for follow up. She has a history of DM, CAD, oxygen dependant COPD, gastroparesis, and chronic diarrhea.   Patient reports she was in the ER earlier this month. She reports she had a syncopal episode and was fatigued, falling asleep easily. She underwent a cardiac workup which was unremarkable. Echocardiogram showed a normal EF, nuclear stress rest was a low risk probability for ischemia. She otherwise has had some diarrhea, abdominal pain, and weight loss which has been bothering her.   She has diarrhea that bothers her roughly every other day. She reports up to 10 BMs per day or so, liquid stools. No blood in the stools. She thinks this has been ongoing for 2 years or so. She reports explosive diarrhea, with significant urgency with some incontinence if the urge is strong. She has some abdominal cramping with diarrhea and is resolved with a bowel movement. She also has had some upper abdominal discomfort, she has some discomfort usually with her diarrhea. She feels bloated and distended frequently. She thinks her weight had been stable since symptoms began, but lost 12 lbs in the past month or so. She had had a prior colonoscopy in 2014 for these symptoms which was normal and biopsies ruled out microscopic colitis. She has had 2 CT scans of the abdomen in the past 6 months which showed fatty liver but otherwise no pathology to cause her pain.   She has frequent nausea otherwise. She takes zofran for nausea. She does not vomit. She feels full easily when she eats and has significant early satiety. She reports certain foods she tolerates better than others. Meats or foods with significant consistency make her symptoms worse. She has heartburn and reflux symptoms that are poorly controlled. She takes pantoprazole 40mg  twice daily which helps some. She has had a prior positive gastric emptying study in 2007, thought to have gastroparesis  related to DM. The patient has been on reglan previously and felt terrible on it, she reports this occurred within 3 days of starting this medications and "will never try it again". She has also been tried on Domeperidone from what she describes, she was on it for a month, ordered from [place]. She thinks it helped slightly but not significantly, but could not afford it and does not wish to try this again. EGD done in 2013 showed a benign GEJ stricture, otherwise normal.   Of note she endorses she has a history of hepatitis C that was never treated, however on last lab work in 2015 for this her HCV viral load was undetectable  SINCE LAST VISIT:  She is here for follow up for chronic diarrhea and gastroparesis. She is also here at the request of her primary care for positive FOBT testing.   For her diarrhea she has been taking immodium one tablet per day, sometimes more if needed. She has been taking colestipol twice daily as well. She is having one BM per day on this regimen. She reports it is solid stools for the most part. She was previously having upwards of 10 BMs per day at the last visit. She was given a course of rifaximin since the last visit. Notes report this benefitted her for a period of time but she states today she does not remember how it affected her. She denies any overt blood in the stools. She has also since stopped her metformin which she thnks has also helped her diarrhea.  She reports she tested positive for occult blood recently done by her PCM. Records obtained showing 1 out of 3 positive for occult blood. She had a colonoscopy in 2014 which showed a normal colon, told to follow up in 10 years. EGD done in 2013 showing a benign esophageal stricture, otherwise normal.   In regards to her gastroparesis, these symptoms are stable. She has chronic nausea and early satiety from her gastroparesis. Prior trial of Reglan did not help and was poorly tolerated. She thinks domperidone  helped her previously to some extent although she used it for only a few months and thought it was expensive. She thinks she is gaining weight, no weight loss, and is eating okay in general. No significant vomiting. She doesn't think these symptoms bother her significantly and wishes to avoid taking anything for this further.     She otherwise endorses "knots in her perianal area" for about week or so. She thinks it can cause discomfort or become irritated. She thinks it is there all the time.         Past Medical History  Diagnosis Date  . Hyperlipidemia   . Benign neoplasm of stomach   . Dizziness and giddiness   . Gastroparesis   . Esophageal reflux   . CAD (coronary artery disease)     a.  cath 2005: LAD 40%;  b. Myoview 3/12: EF 70%, no ischemia;  c. Myoview 9/13: EF 76%, no ischemia  . HTN (hypertension)   . Palpitations   . Depression   . Aortic stenosis     Echo 8/13: EF 55-60%, Mild LVH, mild AS, mean gradient 12 mmHg,   . PONV (postoperative nausea and vomiting)   . TIA (transient ischemic attack) 2015  . Pulmonary embolism ([place])   . Carotid stenosis     right, moderate/notes 07/01/2015  . Esophageal stricture   . Family history of adverse reaction to anesthesia     "my sister gets sick"  . CHF (congestive heart failure) ([place]) 06-13-12    episode after Cancer surgery  . COPD (chronic obstructive pulmonary disease) ([place])   . On home oxygen therapy     "2L all the time" (07/11/2015)  . Chronic bronchitis ([place])     "get it ~ q yr"  (07/11/2015)  . Sleep apnea 06-13-12    "used to wear CPAP; can't sleep w/it" (07/01/2015) cpap setting of 2 when used cpap  . Type II diabetes mellitus ([place])   . Anemia   . History of blood transfusion     "when I had my hemorrhoid OR"  . Hepatitis C     "still active" (07/01/2015)  . Daily headache   . Migraine     "~ 5 times/wk" (07/01/2015)  . Arthritis     "qwhere; especially in my back and hands" (07/01/2015)  . Fibromyalgia   .  Chronic lower back pain   . Anxiety   . Chronic kidney disease     "my kidneys have been about to shut down" (07/01/2015)  . Ovarian cancer ([place]) 2000; 2002  . Chronic headaches      Past Surgical History  Procedure Laterality Date  . Tubal ligation    . Oophorectomy      1 ovary removed, then later last ovary removed  . Anterior and posterior vaginal repair    . Knee arthroscopy Left   . Cataract extraction w/ intraocular lens  implant, bilateral Bilateral 03/2013  . Breast biopsy Left 1972  .  Colonoscopy N/A 08/11/2013    Procedure: COLONOSCOPY;  Surgeon: Inda Castle, MD;  Location: [facility]COPY;  Service: Endoscopy;  Laterality: N/A;  . Esophagogastroduodenoscopy (egd) with esophageal dilation      [facility] Endo 07/01/2015  . Appendectomy    . Cholecystectomy open    . Abdominal hysterectomy    . Band hemorrhoidectomy    . Back surgery    . Lumbar disc surgery    . Dilation and curettage of uterus    . Tonsillectomy  ~ 1958   Family History  Problem Relation Age of Onset  . Brain cancer Father     spine cancer  . Heart disease Mother   . Breast cancer Sister   . Kidney disease Sister   . Diabetes Brother   . Esophageal cancer Brother   . Diabetes Maternal Aunt   . Diabetes Paternal Aunt   . Stomach cancer Paternal Grandfather    Social History  Substance Use Topics  . Smoking status: Never Smoker   . Smokeless tobacco: Never Used  . Alcohol Use: No     Comment: 07/01/2015 "stopped  in before 2000"   Current Outpatient Prescriptions  Medication Sig Dispense Refill  . aspirin 325 MG tablet Take 1 tablet (325 mg total) by mouth daily.    [name] atorvastatin (LIPITOR) 10 MG tablet Take 1 tablet (10 mg total) by mouth daily. 90 tablet 3  . azelastine (ASTELIN) 0.1 % nasal spray 2 sprays each nostril four times daily for 3-5 days then twice a day    . B-D ULTRA-FINE 33 LANCETS MISC TEST BLOOD GLUCOSE 2 x daily    . bisoprolol (ZEBETA) 5 MG tablet TAKE ONE TABLET BY MOUTH  ONCE DAILY 30 tablet 6  . colestipol (COLESTID) 1 g tablet TAKE ONE TABLET BY MOUTH TWICE DAILY 60 tablet 0  . cycloSPORINE (RESTASIS) 0.05 % ophthalmic emulsion 1 drop.    . DULoxetine (CYMBALTA) 30 MG capsule Take 30 mg by mouth 2 (two) times daily.     [name] gabapentin (NEURONTIN) 300 MG capsule Take 1 capsule (300 mg total) by mouth 2 (two) times daily.    [name] gemfibrozil (LOPID) 600 MG tablet Take 600 mg by mouth 2 (two) times daily before a meal.    . glimepiride (AMARYL) 4 MG tablet Take 8 mg by mouth daily with breakfast.     . Insulin Pen Needle 31G X 6 MM MISC Use as directed    . JANUVIA 100 MG tablet Take 100 mg by mouth daily.     [name] lisinopril (PRINIVIL,ZESTRIL) 20 MG tablet Take 20 mg by mouth daily.    . metFORMIN (GLUCOPHAGE) 1000 MG tablet Take 1,000 mg by mouth 2 (two) times daily.    . ondansetron (ZOFRAN) 8 MG tablet Take 1 tablet (8 mg total) by mouth every 8 (eight) hours as needed for nausea or vomiting. 10 tablet 0  . ondansetron (ZOFRAN) 8 MG tablet Take 8 mg by mouth.    . OXYGEN-HELIUM IN 2-3 L by Other route continuous.     . pantoprazole (PROTONIX) 40 MG tablet TAKE ONE TABLET BY MOUTH ONCE DAILY 30 tablet 0  . rifaximin (XIFAXAN) 550 MG TABS tablet Take 1 tablet (550 mg total) by mouth 3 (three) times daily. 42 tablet 0  . solifenacin (VESICARE) 5 MG tablet Take 5 mg by mouth.    . traZODone (DESYREL) 50 MG tablet Take 50 mg by mouth at bedtime as needed for sleep.  No current facility-administered medications for this visit.   Allergies  Allergen Reactions  . Other Shortness Of Breath and Swelling  . Sunflower Oil Anaphylaxis  . Iodine Nausea And Vomiting    Fish causes n/v  . Metoclopramide Hcl Other (See Comments)    Nerves tense up, panic attack  . Sulfamethoxazole Nausea And Vomiting  . Shellfish Allergy Nausea And Vomiting     Review of Systems: All systems reviewed and negative except where noted in HPI.   Lab Results  Component Value Date    WBC 4.7 07/03/2015   HGB 10.2* 07/03/2015   HCT 32.2* 07/03/2015   MCV 89.2 07/03/2015   PLT 165 07/03/2015    Lab Results  Component Value Date   CREATININE 0.81 07/03/2015   BUN 7 07/03/2015   NA 139 07/03/2015   K 4.5 07/03/2015   CL 105 07/03/2015   CO2 25 07/03/2015      Physical Exam: BP 130/80 mmHg  Pulse 91  Ht 5\' 6"  (1.676 m)  Wt 179 lb (81.194 kg)  BMI 28.91 kg/m2 Constitutional: Pleasant,well-developed, female in no acute distress. HEENT: Normocephalic and atraumatic. Conjunctivae are normal. No scleral icterus. Neck supple.  Cardiovascular: Normal rate, regular rhythm.  Pulmonary/chest: Effort normal and breath sounds normal. No wheezing, rales or rhonchi. Abdominal: Soft, nondistended, nontender. Bowel sounds active throughout. There are no masses palpable.  Rectal exam / Anoscopy - internal hemorrhoids, no nodules or polyps, no mass lesions. Externally with superficial skin tags noted noted in the perianal area vs. Less likely condyloma Extremities: no edema Lymphadenopathy: No cervical adenopathy noted. Neurological: Alert and oriented to person place and time. Skin: Skin is warm and dry. No rashes noted. Psychiatric: Normal mood and affect. Behavior is normal.   ASSESSMENT AND PLAN: 65 y/o female with multiple medical problems as outlined above, presenting for follow up for the issues as outlined below:  Chronic diarrhea - she has had a negative evaluation for microscopic colitis with negative colonoscopy for evaluation for this. Celiac labs negative. I think likely functional or perhaps medication induce from metformin. She has stopped metformin with improvement, using immodium once daily with colestid BID. I would continue this regimen now, working much better for her, previously she had more than 10 BMs per day. She can follow up as needed for this issue.  Gastroparesis - intolerant to Reglan, she thinks domperidone may have provided her benefit but  could not afford it. Symptoms are mild and stable at this time, in fact she is gaining weight. She is not interested in medical therapy at this time. She will monitor symptoms, try to follow gastroparesis diet, eat smaller more frequent meals. Follow up as needed.   (+) FOBT - she has had a chronic normocytic anemia for the past 8 months which seems new. 1/3 cards positive for OB. She has had a relatively recent normal colonoscopy in 2014 and EGD in 2013. She has no overt symptoms. I will send repeat CBC with iron studies, B12/folate, TSH to further workup anemia. If iron deficient we will need to proceed with endoscopy. If normal iron stores suspect it less likely that GI bleeding is causing her anemia. 1/3 positive FOBT could be false positive but will further discuss with patient if she wants to proceed with endoscopic evaluation. I think this may be of low yield given relatively recent exams.   Perianal lesions - suspect benign skin tags vs less likely condyloma. Small and superfical. No internal lesions noted on anoscopy.  Will refer to dermatology for possible removal as she is bothered by them. Will await this consultation.   [place] Cellar, MD [facility] Pager [phone]  CC: Vicenta Aly, [place]

## 2015-12-08 NOTE — Interval H&P Note (Signed)
History and Physical Interval Note:  [id number] PM  [name]  has presented today for surgery, with the diagnosis of +FOBT, anemia, hx CHF on oxygen  The various methods of treatment have been discussed with the patient and family. After consideration of risks, benefits and other options for treatment, the patient has consented to  Procedure(s): COLONOSCOPY (N/A) as a surgical intervention .  The patient's history has been reviewed, patient examined, no change in status, stable for surgery.  I have reviewed the patient's chart and labs.  Questions were answered to the patient's satisfaction.     Renelda Loma Sinclair [place]

## 2015-12-08 NOTE — Op Note (Signed)
[facility] Patient Name: [name] [name] Procedure Date: 12/08/2015 MRN: [id number] Attending MD: Carlota Raspberry. Merel Santoli MD, MD Date of Birth: 1950/11/10 CSN:  Age: 65 Admit Type: Outpatient Procedure:                Colonoscopy Indications:              Chronic diarrhea, Heme positive stool - 1 of 3                            FOBT, normocytic anemia with normal iron stores Providers:                Carlota Raspberry. Sueellen Kayes MD, MD, Cleda Daub, RN, Alfonso Patten, Technician, Dione Booze, CRNA Referring MD:              Medicines:                Monitored Anesthesia Care Complications:            No immediate complications. Estimated blood loss:                            Minimal. Estimated Blood Loss:     Estimated blood loss was minimal. Procedure:                Pre-Anesthesia Assessment:                           - Prior to the procedure, a History and Physical                            was performed, and patient medications and                            allergies were reviewed. The patient's tolerance of                            previous anesthesia was also reviewed. The risks                            and benefits of the procedure and the sedation                            options and risks were discussed with the patient.                            All questions were answered, and informed consent                            was obtained. Prior Anticoagulants: The patient has                            taken aspirin, last dose was 1 day prior to  procedure. ASA Grade Assessment: III - A patient                            with severe systemic disease. After reviewing the                            risks and benefits, the patient was deemed in                            satisfactory condition to undergo the procedure.                           After obtaining informed consent, the colonoscope            was passed under direct vision. Throughout the                            procedure, the patient's blood pressure, pulse, and                            oxygen saturations were monitored continuously. The                            was introduced through the anus and advanced to the                            the terminal ileum, with identification of the                            appendiceal orifice and IC valve. The colonoscopy                            was technically difficult and complex due to                            significant looping and a tortuous colon. The                            patient tolerated the procedure well. The quality                            of the bowel preparation was adequate. The                            ileocecal valve, appendiceal orifice, and rectum                            were photographed. Scope In: 12:42:01 PM Scope Out: 1:19:12 PM Scope Withdrawal Time: 0 hours 11 minutes 45 seconds  Total Procedure Duration: 0 hours 37 minutes 11 seconds  Findings:      The perianal exam findings include skin tags.      A patchy area of mildly erythematous mucosa was found in the rectum       without other inflammatory changes. This may likely  be bowel prep       artifact however given the patient's symptoms, this was biopsied with a       cold forceps for histology.      Non-bleeding internal hemorrhoids were found during retroflexion.      The exam was otherwise normal throughout the examined colon. No other       inflammatory changes were noted. The ileum was normal but photograph not       obtained. No polyps appreciated. The colon was very tortous and cecum       folded back upon itself, with required significant lavage for       visualization in this area.      Biopsies for histology were taken with a cold forceps from the right       colon and left colon for evaluation of microscopic colitis. Impression:               - Perianal skin  tags found on perianal exam.                           - Erythematous mucosa in the rectum. Biopsied.                           - Non-bleeding internal hemorrhoids.                           - Normal remainder colon and ileum. Biopsies were                            taken with a cold forceps from the right colon and                            left colon for evaluation of microscopic colitis.                           Overall, hemorrhoids could have caused (+) FOBT. No                            cause for the patient's anemia noted on this exam.                            Anemia is normocytic with normal iron stores,                            consideration for hematology consult should be made. Moderate Sedation:      N/A- Per Anesthesia Care Recommendation:           - Patient has a contact number available for                            emergencies. The signs and symptoms of potential                            delayed complications were discussed with the  patient. Return to normal activities tomorrow.                            Written discharge instructions were provided to the                            patient.                           - Resume previous diet.                           - Continue present medications.                           - Await pathology results.                           - Repeat colonoscopy in 10 years for screening                            purposes. Procedure Code(s):        --- Professional ---                           [phone], Colonoscopy, flexible; with biopsy, single                            or multiple Diagnosis Code(s):        --- Professional ---                           K62.89, Other specified diseases of anus and rectum                           K64.8, Other hemorrhoids                           K64.4, Residual hemorrhoidal skin tags                           K52.9, Noninfective gastroenteritis and colitis,                             unspecified                           R19.5, Other fecal abnormalities CPT copyright 2016 American Medical Association. All rights reserved. The codes documented in this report are preliminary and upon coder review may  be revised to meet current compliance requirements. Remo Lipps P. Ovadia Lopp MD, MD 12/08/2015 1:27:51 PM This report has been signed electronically. Number of Addenda: 0

## 2015-12-08 NOTE — Anesthesia Postprocedure Evaluation (Signed)
Anesthesia Post Note  Patient: [name] [name]  Procedure(s) Performed: Procedure(s) (LRB): COLONOSCOPY (N/A)  Patient location during evaluation: Endoscopy Anesthesia Type: MAC Level of consciousness: awake and alert Pain management: pain level controlled Vital Signs Assessment: post-procedure vital signs reviewed and stable Respiratory status: spontaneous breathing, nonlabored ventilation, respiratory function stable and patient connected to nasal cannula oxygen Cardiovascular status: stable and blood pressure returned to baseline Anesthetic complications: no    Last Vitals:  Filed Vitals:   12/08/15 1355 12/08/15 1400  BP:  150/52  Pulse: 77 73  Temp:    Resp: 15 20    Last Pain: There were no vitals filed for this visit.               Catalina Gravel

## 2015-12-08 NOTE — Anesthesia Preprocedure Evaluation (Addendum)
Anesthesia Evaluation  Patient identified by MRN, date of birth, ID band Patient awake  General Assessment Comment:She has a history of DM, CAD, oxygen dependant COPD, gastroparesis, and chronic diarrhea  Reviewed: Allergy & Precautions, NPO status , Patient's Chart, lab work & pertinent test results, reviewed documented beta blocker date and time   History of Anesthesia Complications (+) PONV, Family history of anesthesia reaction and history of anesthetic complications  Airway Mallampati: II  TM Distance: >3 FB Neck ROM: Full    Dental  (+) Dental Advisory Given, Edentulous Upper, Upper Dentures   Pulmonary shortness of breath, sleep apnea and Continuous Positive Airway Pressure Ventilation , COPD (2L [place]),  COPD inhaler and oxygen dependent,    Pulmonary exam normal breath sounds clear to auscultation       Cardiovascular hypertension, Pt. on medications and Pt. on home beta blockers + CAD, + Peripheral Vascular Disease and +CHF  + Valvular Problems/Murmurs AS  Rhythm:Regular Rate:Normal + Systolic murmurs Echo [id number]: Study Conclusions  - Left ventricle: The cavity size was normal. Wall thickness was increased in a pattern of mild LVH. Systolic function was normal. The estimated ejection fraction was in the range of 60% to 65%. Wall motion was normal; there were no regional wall motion abnormalities. Left ventricular diastolic function parameters were normal. - Aortic valve: Valve area (VTI): 2.58 cm^2. Valve area (Vmax): 2.17 cm^2. Valve area (Vmean): 1.91 cm^2. - Mitral valve: Calcified annulus. Mildly thickened leaflets .  There was mild regurgitation.   Neuro/Psych  Headaches, PSYCHIATRIC DISORDERS Anxiety Depression TIA   GI/Hepatic GERD  Medicated,(+) Hepatitis -, CBenign neoplasm of stomach Nausea Gastroparesis Esophageal stricture    Endo/Other  diabetes, Type 2, Oral Hypoglycemic Agents  Renal/GU negative  Renal ROS     Musculoskeletal  (+) Arthritis , Fibromyalgia -  Abdominal   Peds  Hematology  (+) Blood dyscrasia, anemia ,   Anesthesia Other Findings Day of surgery medications reviewed with the patient.  Reproductive/Obstetrics                          Anesthesia Physical Anesthesia Plan  ASA: III  Anesthesia Plan: MAC   Post-op Pain Management:    Induction: Intravenous  Airway Management Planned: Nasal Cannula  Additional Equipment:   Intra-op Plan:   Post-operative Plan:   Informed Consent: I have reviewed the patients History and Physical, chart, labs and discussed the procedure including the risks, benefits and alternatives for the proposed anesthesia with the patient or authorized representative who has indicated his/her understanding and acceptance.   Dental advisory given  Plan Discussed with: CRNA and Anesthesiologist  Anesthesia Plan Comments: (Discussed risks/benefits/alternatives to MAC sedation including need for ventilatory support, hypotension, need for conversion to general anesthesia.  All patient questions answered.  Patient/guardian wishes to proceed.)        Anesthesia Quick Evaluation

## 2015-12-09 ENCOUNTER — Encounter (HOSPITAL_COMMUNITY): Payer: Self-pay | Admitting: Gastroenterology

## 2015-12-11 ENCOUNTER — Encounter: Payer: Self-pay | Admitting: Gastroenterology

## 2015-12-13 ENCOUNTER — Ambulatory Visit (INDEPENDENT_AMBULATORY_CARE_PROVIDER_SITE_OTHER): Payer: Medicare Other | Admitting: Internal Medicine

## 2015-12-13 ENCOUNTER — Encounter: Payer: Self-pay | Admitting: Internal Medicine

## 2015-12-13 VITALS — BP 140/78 | HR 100 | Ht 66.0 in | Wt 173.8 lb

## 2015-12-13 DIAGNOSIS — I10 Essential (primary) hypertension: Secondary | ICD-10-CM

## 2015-12-13 DIAGNOSIS — E782 Mixed hyperlipidemia: Secondary | ICD-10-CM

## 2015-12-13 DIAGNOSIS — I2510 Atherosclerotic heart disease of native coronary artery without angina pectoris: Secondary | ICD-10-CM

## 2015-12-13 DIAGNOSIS — R42 Dizziness and giddiness: Secondary | ICD-10-CM | POA: Diagnosis not present

## 2015-12-13 LAB — BASIC METABOLIC PANEL
BUN: 23 mg/dL (ref 7–25)
CHLORIDE: 105 mmol/L (ref 98–110)
CO2: 23 mmol/L (ref 20–31)
Calcium: 9.4 mg/dL (ref 8.6–10.4)
Creat: 1.46 mg/dL — ABNORMAL HIGH (ref 0.50–0.99)
Glucose, Bld: 166 mg/dL — ABNORMAL HIGH (ref 65–99)
POTASSIUM: 4.3 mmol/L (ref 3.5–5.3)
Sodium: 142 mmol/L (ref 135–146)

## 2015-12-13 LAB — LIPID PANEL
Cholesterol: 151 mg/dL (ref 125–200)
HDL: 49 mg/dL (ref >=46)
LDL CALC: 41 mg/dL (ref <130)
TRIGLYCERIDES: 306 mg/dL — AB (ref <150)
Total CHOL/HDL Ratio: 3.1 Ratio (ref <=5.0)
VLDL: 61 mg/dL — AB (ref <30)

## 2015-12-13 NOTE — Progress Notes (Signed)
Cardiology Office Note   Date:  2/95/1884   ID:  [name] [name], DOB [birth year], MRN [id number]  PCP:  Vicenta Aly, FNP  Cardiologist:   Dorris Carnes, MD   No chief complaint on file.     History of Present Illness: [name] [name] is a 65 y.o. female with a history of  history of minimal CAD by cath in 2005 (LAD 40%), DM2, HL, GERD, HTN, chronic HCV, carotid stenosis, chest pain and palpitations. Myoview 3/12: EF 70%, no ischemia. Carotid Dopplers 10/10: RICA 40-59%. She has required esophageal dilatation in the past. She is followed at the [facility] for depression. Echo demonstrated normal LVF, mild AS, mild LVH She had knee surgery last year Post procedure developed a PE  I saw the patient in October 2016  The patient denies CP  Has had a lot of dizziness esp if lying down   Uses oxygen  Says her breathing is OK with it      Outpatient Prescriptions Prior to Visit  Medication Sig Dispense Refill  . aspirin 325 MG tablet Take 1 tablet (325 mg total) by mouth daily.    [name] atorvastatin (LIPITOR) 10 MG tablet Take 1 tablet (10 mg total) by mouth daily. 90 tablet 3  . azelastine (ASTELIN) 0.1 % nasal spray 2 sprays each nostril four times daily for 3-5 days then twice a day    . B-D ULTRA-FINE 33 LANCETS MISC TEST BLOOD GLUCOSE 2 x daily    . bisoprolol (ZEBETA) 5 MG tablet TAKE ONE TABLET BY MOUTH ONCE DAILY 30 tablet 6  . colestipol (COLESTID) 1 g tablet TAKE ONE TABLET BY MOUTH TWICE DAILY 60 tablet 0  . cycloSPORINE (RESTASIS) 0.05 % ophthalmic emulsion Place 1 drop into both eyes 2 (two) times daily.     . DULoxetine (CYMBALTA) 30 MG capsule Take 30 mg by mouth 2 (two) times daily.     [name] gabapentin (NEURONTIN) 300 MG capsule Take 1 capsule (300 mg total) by mouth 2 (two) times daily.    [name] gemfibrozil (LOPID) 600 MG tablet Take 600 mg by mouth 2 (two) times daily before a meal.    . glimepiride (AMARYL) 4 MG tablet Take 8 mg by mouth daily with breakfast.     .  JANUVIA 100 MG tablet Take 100 mg by mouth daily.     [name] lisinopril (PRINIVIL,ZESTRIL) 20 MG tablet Take 20 mg by mouth daily.    [name] loperamide (IMODIUM A-D) 2 MG tablet Take 2 mg by mouth 4 (four) times daily as needed for diarrhea or loose stools.    . ondansetron (ZOFRAN) 8 MG tablet Take 1 tablet (8 mg total) by mouth every 8 (eight) hours as needed for nausea or vomiting. 10 tablet 0  . OXYGEN-HELIUM IN 2-3 L by Other route continuous.     . pantoprazole (PROTONIX) 40 MG tablet TAKE ONE TABLET BY MOUTH ONCE DAILY 30 tablet 5  . rifaximin (XIFAXAN) 550 MG TABS tablet Take 1 tablet (550 mg total) by mouth 3 (three) times daily. (Patient taking differently: Take 550 mg by mouth 2 (two) times daily. ) 42 tablet 0  . solifenacin (VESICARE) 5 MG tablet Take 5 mg by mouth daily.     . traZODone (DESYREL) 50 MG tablet Take 50 mg by mouth at bedtime as needed for sleep.     . Insulin Pen Needle 31G X 6 MM MISC Reported on 12/13/2015    . SUPREP BOWEL PREP SOLN Take 1  kit by mouth once. (Patient not taking: Reported on 12/13/2015) 354 mL 0   No facility-administered medications prior to visit.     Allergies:   Sunflower oil; Iodine; Metoclopramide hcl; Sulfamethoxazole; and Shellfish allergy   Past Medical History  Diagnosis Date  . Hyperlipidemia   . Benign neoplasm of stomach   . Dizziness and giddiness   . Gastroparesis   . Esophageal reflux   . CAD (coronary artery disease)     a.  cath 2005: LAD 40%;  b. Myoview 3/12: EF 70%, no ischemia;  c. Myoview 9/13: EF 76%, no ischemia  . HTN (hypertension)   . Palpitations   . Depression   . Aortic stenosis     Echo 8/13: EF 55-60%, Mild LVH, mild AS, mean gradient 12 mmHg,   . PONV (postoperative nausea and vomiting)   . TIA (transient ischemic attack) 2015  . Pulmonary embolism ([place])   . Carotid stenosis     right, moderate/notes 07/01/2015  . Esophageal stricture   . Family history of adverse reaction to anesthesia     "my sister gets  sick"  . CHF (congestive heart failure) ([place]) 06-13-12    episode after Cancer surgery  . COPD (chronic obstructive pulmonary disease) ([place])   . On home oxygen therapy     "2L all the time" (07/11/2015)  . Chronic bronchitis ([place])     "get it ~ q yr"  (07/11/2015)  . Sleep apnea 06-13-12    "used to wear CPAP; can't sleep w/it" (07/01/2015) cpap setting of 2 when used cpap  . Type II diabetes mellitus ([place])   . Anemia   . History of blood transfusion     "when I had my hemorrhoid OR"  . Hepatitis C     "still active" (07/01/2015)  . Daily headache   . Migraine     "~ 5 times/wk" (07/01/2015)  . Arthritis     "qwhere; especially in my back and hands" (07/01/2015)  . Fibromyalgia   . Chronic lower back pain   . Anxiety   . Chronic kidney disease     "my kidneys have been about to shut down" (07/01/2015)  . Ovarian cancer ([place]) 2000; 2002  . Chronic headaches     Past Surgical History  Procedure Laterality Date  . Tubal ligation    . Oophorectomy      1 ovary removed, then later last ovary removed  . Anterior and posterior vaginal repair    . Knee arthroscopy Left   . Cataract extraction w/ intraocular lens  implant, bilateral Bilateral 03/2013  . Breast biopsy Left 1972  . Colonoscopy N/A 08/11/2013    Procedure: COLONOSCOPY;  Surgeon: Inda Castle, MD;  Location: [facility];  Service: Endoscopy;  Laterality: N/A;  . Esophagogastroduodenoscopy (egd) with esophageal dilation      [facility] Endo 07/01/2015  . Appendectomy    . Cholecystectomy open    . Abdominal hysterectomy    . Band hemorrhoidectomy    . Back surgery    . Lumbar disc surgery    . Dilation and curettage of uterus    . Tonsillectomy  ~ 1958  . Colonoscopy N/A 12/08/2015    Procedure: COLONOSCOPY;  Surgeon: Manus Gunning, MD;  Location: [facility];  Service: Gastroenterology;  Laterality: N/A;     Social History:  The patient  reports that she has never smoked. She has never used smokeless tobacco.  She reports that she does not drink alcohol or use  illicit drugs.   Family History:  The patient's family history includes Brain cancer in her father; Breast cancer in her sister; Diabetes in her brother, maternal aunt, and paternal aunt; Esophageal cancer in her brother; Heart disease in her mother; Kidney disease in her sister; Stomach cancer in her paternal grandfather. There is no history of Colon cancer.    ROS:  Please see the history of present illness. All other systems are reviewed and  Negative to the above problem except as noted.    PHYSICAL EXAM: VS:  BP 140/78 mmHg  Pulse 100  Ht '5\' 6"'  (1.676 m)  Wt 173 lb 12.8 oz (78.835 kg)  BMI 28.07 kg/m2  SpO2 96%  GEN: Well nourished, well developed, in no acute distress HEENT: normal Neck: no JVD, carotid bruits, or masses Cardiac: RRR; no murmurs, rubs, or gallops,no edema  Respiratory:  clear to auscultation bilaterally, normal work of breathing GI: soft, nontender, nondistended, + BS  No hepatomegaly  MS: no deformity Moving all extremities   Skin: warm and dry, no rash Neuro:  Strength and sensation are intact Psych: euthymic mood, full affect   EKG:  EKG is not ordered today.   Lipid Panel    Component Value Date/Time   CHOL 102 07/02/2015 0638   TRIG 218* 07/02/2015 0638   HDL 18* 07/02/2015 0638   CHOLHDL 5.7 07/02/2015 0638   VLDL 44* 07/02/2015 0638   LDLCALC 40 07/02/2015 0638   LDLDIRECT 63.6 07/29/2009 0945      Wt Readings from Last 3 Encounters:  12/13/15 173 lb 12.8 oz (78.835 kg)  12/08/15 181 lb (82.101 kg)  12/02/15 181 lb 12.8 oz (82.464 kg)      ASSESSMENT AND PLAN:  1  Dizziness  Will refer to vestibular rehab  2  HL  Get lipids   3  CAD  I am not convinced of any active angina     F/u in 6 months F/U in November  Signed, Kynnadi Dicenso, MD  12/13/2015 4:01 PM    [facility] [address], [place], [place]  [address] Phone: [phone]; Fax: [phone]

## 2015-12-13 NOTE — Patient Instructions (Signed)
Medication Instructions:  Your physician recommends that you continue on your current medications as directed. Please refer to the Current Medication list given to you today.   Labwork: Lipid and Bmet today  Testing/Procedures: None ordered  Follow-Up: You have been referred to Vestibular Rehab  Your physician wants you to follow-up in: 6 months with Dr.Ross You will receive a reminder letter in the mail two months in advance. If you don't receive a letter, please call our office to schedule the follow-up appointment.    Any Other Special Instructions Will Be Listed Below (If Applicable).     If you need a refill on your cardiac medications before your next appointment, please call your pharmacy.

## 2015-12-14 ENCOUNTER — Other Ambulatory Visit: Payer: Self-pay | Admitting: *Deleted

## 2015-12-14 DIAGNOSIS — I10 Essential (primary) hypertension: Secondary | ICD-10-CM

## 2015-12-14 DIAGNOSIS — Z79899 Other long term (current) drug therapy: Secondary | ICD-10-CM

## 2015-12-17 ENCOUNTER — Ambulatory Visit: Payer: Medicare Other | Attending: Internal Medicine | Admitting: Physical Therapy

## 2015-12-17 DIAGNOSIS — H8111 Benign paroxysmal vertigo, right ear: Secondary | ICD-10-CM

## 2015-12-17 DIAGNOSIS — Z9181 History of falling: Secondary | ICD-10-CM | POA: Diagnosis present

## 2015-12-17 DIAGNOSIS — R42 Dizziness and giddiness: Secondary | ICD-10-CM | POA: Diagnosis present
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
## 2015-12-21 ENCOUNTER — Ambulatory Visit: Payer: Medicare Other | Admitting: Physical Therapy

## 2015-12-22 ENCOUNTER — Ambulatory Visit: Payer: Medicare Other | Admitting: Physical Therapy

## 2015-12-28 ENCOUNTER — Encounter: Payer: Medicare Other | Admitting: Rehabilitative and Restorative Service Providers"

## 2015-12-29 ENCOUNTER — Other Ambulatory Visit (INDEPENDENT_AMBULATORY_CARE_PROVIDER_SITE_OTHER): Payer: Medicare Other | Admitting: *Deleted

## 2015-12-29 DIAGNOSIS — I10 Essential (primary) hypertension: Secondary | ICD-10-CM | POA: Diagnosis not present

## 2015-12-29 DIAGNOSIS — Z79899 Other long term (current) drug therapy: Secondary | ICD-10-CM

## 2015-12-29 LAB — BASIC METABOLIC PANEL
BUN: 36 mg/dL — ABNORMAL HIGH (ref 7–25)
CALCIUM: 9.3 mg/dL (ref 8.6–10.4)
CHLORIDE: 105 mmol/L (ref 98–110)
CO2: 22 mmol/L (ref 20–31)
CREATININE: 1.2 mg/dL — AB (ref 0.50–0.99)
Glucose, Bld: 146 mg/dL — ABNORMAL HIGH (ref 65–99)
Potassium: 4.5 mmol/L (ref 3.5–5.3)
Sodium: 140 mmol/L (ref 135–146)

## 2015-12-30 ENCOUNTER — Ambulatory Visit: Payer: Medicare Other | Attending: Internal Medicine | Admitting: Physical Therapy

## 2016-01-01 ENCOUNTER — Other Ambulatory Visit: Payer: Self-pay | Admitting: Gastroenterology

## 2016-01-03 ENCOUNTER — Telehealth: Payer: Self-pay | Admitting: Internal Medicine

## 2016-01-03 DIAGNOSIS — R0602 Shortness of breath: Secondary | ICD-10-CM

## 2016-01-03 DIAGNOSIS — I2510 Atherosclerotic heart disease of native coronary artery without angina pectoris: Secondary | ICD-10-CM

## 2016-01-03 NOTE — Telephone Encounter (Signed)
Pt calling re labs results   pls call  [phone]

## 2016-01-03 NOTE — Telephone Encounter (Signed)
Spoke with pt and reviewed lab work and recommendations from Dr. Harrington Challenger with her.  She will come in for lab work on 02/10/16

## 2016-01-04 ENCOUNTER — Encounter: Payer: Medicare Other | Admitting: Physical Therapy

## 2016-01-06 ENCOUNTER — Encounter: Payer: Medicare Other | Admitting: Physical Therapy

## 2016-01-07 ENCOUNTER — Ambulatory Visit (INDEPENDENT_AMBULATORY_CARE_PROVIDER_SITE_OTHER): Payer: Medicare Other | Admitting: Internal Medicine

## 2016-01-07 ENCOUNTER — Encounter: Payer: Self-pay | Admitting: Internal Medicine

## 2016-01-07 VITALS — BP 118/64 | HR 79 | Ht 66.0 in | Wt 181.6 lb

## 2016-01-07 DIAGNOSIS — G4733 Obstructive sleep apnea (adult) (pediatric): Secondary | ICD-10-CM | POA: Diagnosis not present

## 2016-01-07 DIAGNOSIS — I639 Cerebral infarction, unspecified: Secondary | ICD-10-CM | POA: Diagnosis not present

## 2016-01-07 DIAGNOSIS — J9611 Chronic respiratory failure with hypoxia: Secondary | ICD-10-CM

## 2016-01-07 NOTE — Assessment & Plan Note (Signed)
Previously documented mild obstructive sleep apnea. She remembers symptomatic improvement with CPAP but mask leak was bothersome. She needs support help from her DME company to get her CPAP machine repaired or replaced. Plan-appropriate orders to [facility] for CPAP 9

## 2016-01-07 NOTE — Assessment & Plan Note (Signed)
Apparently multifactorial lung disease. She is to follow-up with Dr. Lamonte Sakai for management including her oxygen.

## 2016-01-07 NOTE — Patient Instructions (Signed)
Order- D[facility]- continue CPAP 9, mask of choice, humidifier, supplies, AirView   For dx OSA                 Please evaluate for repair of machine- may be missing power cord. Evaluate eligibility for replacement of old machine. Patient needs assistance refitting mask of choice to reduce leaks.

## 2016-01-07 NOTE — Progress Notes (Signed)
01/07/2016-65 year old female never smoker for sleep medicine evaluation with history of OSA Referred courtesy of Vicenta Aly, PA-PCP for patient. Waking up gasping for air, snoring as well. DME: [facility] for CPAP(missing pieces and not wearing) and O2 . Complicating medical problems include chronic hepatitis C, DM 2, HBP, CAD, CVA with memory impairment, chronic hypoxic respiratory failure (Dr. Lamonte Sakai), history pulmonary embolism. NPSG-02/28/11- [facility] 10.8/ hr, desat to 77% on room air, body weight 184 lbs CPAP Advanced 9 cwp on AirView O2 2L/ continuous by Dr Lamonte Sakai  She has moved a couple of times and lost a power unit for her CPAP machine. She is aware of waking at night not breathing. Daytime sleepiness and witnessed loud snoring according to family. Caffeine intake limited to sodas. ENT surgery limited to tonsils. Has an upper denture plate.  Prior to Admission medications   Medication Sig Start Date End Date Taking? Authorizing Provider  aspirin 325 MG tablet Take 1 tablet (325 mg total) by mouth daily. 11/13/13  Yes Orson Eva, MD  atorvastatin (LIPITOR) 10 MG tablet Take 1 tablet (10 mg total) by mouth daily. 06/04/15  Yes Fay Records, MD  azelastine (ASTELIN) 0.1 % nasal spray 2 sprays each nostril four times daily for 3-5 days then twice a day 09/07/15  Yes Historical Provider, MD  B-D ULTRA-FINE 33 LANCETS MISC TEST BLOOD GLUCOSE 2 x daily 06/01/15  Yes Historical Provider, MD  bisoprolol (ZEBETA) 5 MG tablet TAKE ONE TABLET BY MOUTH ONCE DAILY 10/11/15  Yes Fay Records, MD  colestipol (COLESTID) 1 g tablet TAKE ONE TABLET BY MOUTH TWICE DAILY 01/03/16  Yes Manus Gunning, MD  cycloSPORINE (RESTASIS) 0.05 % ophthalmic emulsion Place 1 drop into both eyes 2 (two) times daily.  05/20/13  Yes Historical Provider, MD  DULoxetine (CYMBALTA) 30 MG capsule Take 30 mg by mouth 2 (two) times daily.  06/02/15  Yes Historical Provider, MD  gabapentin (NEURONTIN) 300 MG capsule Take 1 capsule (300 mg  total) by mouth 2 (two) times daily. 07/03/15  Yes Smiley Houseman, MD  gemfibrozil (LOPID) 600 MG tablet Take 600 mg by mouth 2 (two) times daily before a meal.   Yes Historical Provider, MD  glimepiride (AMARYL) 4 MG tablet Take 8 mg by mouth daily with breakfast.  06/21/13  Yes Historical Provider, MD  JANUVIA 100 MG tablet Take 100 mg by mouth daily.  06/02/15  Yes Historical Provider, MD  lisinopril (PRINIVIL,ZESTRIL) 20 MG tablet Take 20 mg by mouth daily.   Yes Historical Provider, MD  loperamide (IMODIUM A-D) 2 MG tablet Take 2 mg by mouth 4 (four) times daily as needed for diarrhea or loose stools.   Yes Historical Provider, MD  ondansetron (ZOFRAN) 8 MG tablet Take 1 tablet (8 mg total) by mouth every 8 (eight) hours as needed for nausea or vomiting. 07/04/15  Yes Mercedes Camprubi-Soms, PA-C  OXYGEN-HELIUM IN 2-3 L by Other route continuous.    Yes Historical Provider, MD  pantoprazole (PROTONIX) 40 MG tablet TAKE ONE TABLET BY MOUTH ONCE DAILY 12/01/15  Yes Manus Gunning, MD  rifaximin (XIFAXAN) 550 MG TABS tablet Take 1 tablet (550 mg total) by mouth 3 (three) times daily. Patient taking differently: Take 550 mg by mouth 2 (two) times daily.  07/15/15  Yes Manus Gunning, MD  solifenacin (VESICARE) 5 MG tablet Take 5 mg by mouth daily.  07/09/15 07/08/16 Yes Historical Provider, MD  traZODone (DESYREL) 50 MG tablet Take 50 mg by mouth at  bedtime as needed for sleep.  05/07/14  Yes Historical Provider, MD   Past Medical History  Diagnosis Date  . Hyperlipidemia   . Benign neoplasm of stomach   . Dizziness and giddiness   . Gastroparesis   . Esophageal reflux   . CAD (coronary artery disease)     a.  cath 2005: LAD 40%;  b. Myoview 3/12: EF 70%, no ischemia;  c. Myoview 9/13: EF 76%, no ischemia  . HTN (hypertension)   . Palpitations   . Depression   . Aortic stenosis     Echo 8/13: EF 55-60%, Mild LVH, mild AS, mean gradient 12 mmHg,   . PONV (postoperative  nausea and vomiting)   . TIA (transient ischemic attack) 2015  . Pulmonary embolism ([place])   . Carotid stenosis     right, moderate/notes 07/01/2015  . Esophageal stricture   . Family history of adverse reaction to anesthesia     "my sister gets sick"  . CHF (congestive heart failure) ([place]) 06-13-12    episode after Cancer surgery  . COPD (chronic obstructive pulmonary disease) ([place])   . On home oxygen therapy     "2L all the time" (07/11/2015)  . Chronic bronchitis ([place])     "get it ~ q yr"  (07/11/2015)  . Sleep apnea 06-13-12    "used to wear CPAP; can't sleep w/it" (07/01/2015) cpap setting of 2 when used cpap  . Type II diabetes mellitus ([place])   . Anemia   . History of blood transfusion     "when I had my hemorrhoid OR"  . Hepatitis C     "still active" (07/01/2015)  . Daily headache   . Migraine     "~ 5 times/wk" (07/01/2015)  . Arthritis     "qwhere; especially in my back and hands" (07/01/2015)  . Fibromyalgia   . Chronic lower back pain   . Anxiety   . Chronic kidney disease     "my kidneys have been about to shut down" (07/01/2015)  . Ovarian cancer ([place]) 2000; 2002  . Chronic headaches    Past Surgical History  Procedure Laterality Date  . Tubal ligation    . Oophorectomy      1 ovary removed, then later last ovary removed  . Anterior and posterior vaginal repair    . Knee arthroscopy Left   . Cataract extraction w/ intraocular lens  implant, bilateral Bilateral 03/2013  . Breast biopsy Left 1972  . Colonoscopy N/A 08/11/2013    Procedure: COLONOSCOPY;  Surgeon: Inda Castle, MD;  Location: [facility];  Service: Endoscopy;  Laterality: N/A;  . Esophagogastroduodenoscopy (egd) with esophageal dilation      Ar[facility] Endo 07/01/2015  . Appendectomy    . Cholecystectomy open    . Abdominal hysterectomy    . Band hemorrhoidectomy    . Back surgery    . Lumbar disc surgery    . Dilation and curettage of uterus    . Tonsillectomy  ~ 1958  . Colonoscopy N/A 12/08/2015     Procedure: COLONOSCOPY;  Surgeon: Manus Gunning, MD;  Location: [facility];  Service: Gastroenterology;  Laterality: N/A;   Family History  Problem Relation Age of Onset  . Brain cancer Father     spine cancer  . Heart disease Mother   . Breast cancer Sister   . Kidney disease Sister   . Diabetes Brother   . Esophageal cancer Brother   . Diabetes Maternal Aunt   .  Diabetes Paternal Aunt   . Stomach cancer Paternal Grandfather   . Colon cancer Neg Hx    Social History   Social History  . Marital Status: Widowed    Spouse Name: N/A  . Number of Children: 3  . Years of Education: N/A   Occupational History  . Disabled     memory loss   Social History Main Topics  . Smoking status: Never Smoker   . Smokeless tobacco: Never Used  . Alcohol Use: No     Comment: 07/01/2015 "stopped  in before 2000"  . Drug Use: No     Comment: 07/01/2015 "qut in 2000; tried pot, didn't enjoy it"  . Sexual Activity: No   Other Topics Concern  . Not on file   Social History Narrative   Lives   Caffeine use:   ROS-see HPI   Negative unless "+" Constitutional:    weight loss, night sweats, fevers, chills, +fatigue, lassitude. HEENT:    headaches, difficulty swallowing, tooth/dental problems, sore throat,       sneezing, itching, ear ache, nasal congestion, post nasal drip, snoring CV:    chest pain, orthopnea, PND, swelling in lower extremities, anasarca,                                                         dizziness, palpitations Resp:  + shortness of breath with exertion or at rest.                productive cough,   non-productive cough, coughing up of blood.              change in color of mucus.  wheezing.   Skin:    rash or lesions. GI:  No-   heartburn, indigestion, abdominal pain, nausea, vomiting, diarrhea,                 change in bowel habits, loss of appetite GU: dysuria, change in color of urine, no urgency or frequency.   flank pain. MS:   joint pain,  stiffness, decreased range of motion, back pain. Neuro-     nothing unusual Psych:  change in mood or affect.  depression or anxiety.   memory loss.  OBJ- Physical Exam   Wearing oxygen 2 L portable General- Alert, Oriented, + flat but responsive, Distress- none acute Skin- rash-none, lesions- none, excoriation- none Lymphadenopathy- none Head- atraumatic            Eyes- Gross vision intact, PERRLA, conjunctivae and secretions clear            Ears- Hearing, canals-normal            Nose- Clear, no-Septal dev, mucus, polyps, erosion, perforation             Throat- Mallampati IV , mucosa clear , drainage- none, tonsils- atrophic Neck- flexible , trachea midline, no stridor , thyroid nl, carotid no bruit Chest - symmetrical excursion , unlabored           Heart/CV- RRR , no murmur , no gallop  , no rub, nl s1 s2                           - JVD- none , edema- none, stasis changes- none, varices- none  Lung- clear to P&A, wheeze- none, cough- none , dullness-none, rub- none           Chest wall-  Abd-  Br/ Gen/ Rectal- Not done, not indicated Extrem- cyanosis- none, clubbing, none, atrophy- none, strength- nl Neuro- grossly intact to observation

## 2016-01-07 NOTE — Assessment & Plan Note (Signed)
Flat affect noted on exam. She appeared to have intact memory and judgment within the scope of this examination

## 2016-02-10 ENCOUNTER — Other Ambulatory Visit: Payer: Medicare Other

## 2016-02-14 ENCOUNTER — Other Ambulatory Visit: Payer: Self-pay | Admitting: Obstetrics and Gynecology

## 2016-02-14 ENCOUNTER — Other Ambulatory Visit: Payer: Self-pay | Admitting: *Deleted

## 2016-02-14 ENCOUNTER — Ambulatory Visit
Admission: RE | Admit: 2016-02-14 | Discharge: 2016-02-14 | Disposition: A | Discharge status: Home / Self Care | Payer: Medicare Other | Source: Ambulatory Visit | Attending: Obstetrics and Gynecology | Admitting: Obstetrics and Gynecology

## 2016-02-14 ENCOUNTER — Other Ambulatory Visit (INDEPENDENT_AMBULATORY_CARE_PROVIDER_SITE_OTHER): Payer: Medicare Other | Admitting: *Deleted

## 2016-02-14 DIAGNOSIS — K3184 Gastroparesis: Secondary | ICD-10-CM

## 2016-02-14 DIAGNOSIS — I2510 Atherosclerotic heart disease of native coronary artery without angina pectoris: Secondary | ICD-10-CM

## 2016-02-14 DIAGNOSIS — R0602 Shortness of breath: Secondary | ICD-10-CM | POA: Diagnosis not present

## 2016-02-14 LAB — BASIC METABOLIC PANEL
BUN: 33 mg/dL — AB (ref 7–25)
CALCIUM: 9.3 mg/dL (ref 8.6–10.4)
CHLORIDE: 105 mmol/L (ref 98–110)
CO2: 25 mmol/L (ref 20–31)
CREATININE: 1.14 mg/dL — AB (ref 0.50–0.99)
Glucose, Bld: 177 mg/dL — ABNORMAL HIGH (ref 65–99)
Potassium: 4.6 mmol/L (ref 3.5–5.3)
Sodium: 140 mmol/L (ref 135–146)

## 2016-02-14 LAB — BRAIN NATRIURETIC PEPTIDE: BRAIN NATRIURETIC PEPTIDE: 28.4 pg/mL (ref <100)

## 2016-02-22 ENCOUNTER — Encounter: Payer: Self-pay | Admitting: Emergency Medicine

## 2016-02-22 ENCOUNTER — Ambulatory Visit (INDEPENDENT_AMBULATORY_CARE_PROVIDER_SITE_OTHER): Payer: Medicare Other | Admitting: Emergency Medicine

## 2016-02-22 VITALS — BP 122/86 | HR 85 | Ht 66.0 in | Wt 182.0 lb

## 2016-02-22 DIAGNOSIS — I639 Cerebral infarction, unspecified: Secondary | ICD-10-CM | POA: Diagnosis not present

## 2016-02-22 DIAGNOSIS — G4733 Obstructive sleep apnea (adult) (pediatric): Secondary | ICD-10-CM | POA: Diagnosis not present

## 2016-02-22 DIAGNOSIS — J9611 Chronic respiratory failure with hypoxia: Secondary | ICD-10-CM

## 2016-02-22 NOTE — Patient Instructions (Addendum)
Please continue your oxygen at 3L/min We will ask [facility] to evaluate your oxygen tanks and concentrator to insure operating correctly.  You may benefit from restarting your astelin nasal spray, 2 sprays each nostril 2-3x a day.   You will benefit from restarting your CPAP every night. We will try to clarify getting the equipment that you need Follow with Dr Lamonte Sakai in 12 months or sooner if you have any problems

## 2016-02-22 NOTE — Assessment & Plan Note (Signed)
This appears to be multifactorial in the setting of restrictive disease without interstitial disease on imaging, history of pulmonary embolism, untreated obstructive sleep apnea, obesity. Her echocardiogram does not show evidence for associated pulmonary hypertension. At this time I believe she needs to continue her oxygen at 3 L/m. We will follow her clinically, follow chest x-ray. She does need to restart her CPAP and she is willing to do this when the equipment is obtained.   Please continue your oxygen at 3L/min We will ask [facility] to evaluate your oxygen tanks and concentrator to insure operating correctly.  You may benefit from restarting your astelin nasal spray, 2 sprays each nostril 2-3x a day.   You will benefit from restarting your CPAP every night. We will try to clarify getting the equipment that you need Follow with Dr Lamonte Sakai in 12 months or sooner if you have any problems

## 2016-02-22 NOTE — Progress Notes (Signed)
Subjective:    Patient ID: [name] [name], female    DOB: April 02, 1951, 65 y.o.   MRN: [id number]  HPI 65 yo woman, never smoker, mild CAD, mild AS, Hep C, HTN, small L PE in 05/2012 completed therapy, OSA not reliable on CPAP, DM, ovarian CA. Followed by Dr Harrington Challenger at [facility] and referred for dyspnea and hypoxemia. Of note was seen in ED 04/10/13 for chest pressure and dyspnea > V/q low prob. She has been having dyspnea and difficulty taking a deep breath for about 2 weeks. Never had this problem before. Bothers her when eating, when she wakes up in am while supine - better when she gets up. She describes recent falls - about 3 times in last several months. The last time was 3 weeks ago. No clear trauma to ribs, although she did hit her back and head. Just moved into a camper, not using CPAP for 9-10 months. She has been noted to have desats in the ED and at Cardiology. No wheeze, occas dry cough (on lisinopril). She has albuterol - may help her some.   ROV 05/15/13 -- follows up for dyspnea and hypoxemia. She returns for f/u. She has not restarted CPAP, although she did wear it for one night to get her ONO. We performed ONO on CPAP that showed desat < 88% for 20% of the night. PFT performed > show restriction and decreased DLCO. TTE from 2013 showed normal RV size and fxn.     ROV 09/19/13 - Hx dyspnea and hypoxemia.  She has not been able to go back to CPAP, feels the mask fit is poor  ROV 01/22/14 -- hx restrictive disease, OSA on CPAP but , dyspnea, hypoxemia. Also hx PE, DM, ovarian CA, CAD and AS. Last time we tried to restart CPAP with a new mask but she was unable to wear due to leak. Since last time she unfortunately suffered a TIA - she is still dealing with some memory problems. Oxygen compliance is good inside, but she cannot use it when she is walking her dogs because she is worried about getting tangled up. She is having increased PND, dyspnea. Not on allergy meds.   ROV 01/11/15 -- follow-up  visit for history of PE, CAD, AS. She is followed by me for restrictive lung disease and obstructive sleep apnea.   She presents today acutely ill - acute worsening of nausea, vomiting, diarrhea, also a diffuse rash that began about 2 weeks ago. She has cough, minimally productive of white mucous. She has not found a tick or tick bite. She has had HA for about two weeks.  She lives in a home with many kids, 77 people total, one of the kids had a similar illness.                                           She has a CPAP mask, does not use it - says it keeps her awake.   ROV 02/22/16 -- patient follows up for chronic hypoxemic respiratory failure. She has a history of coronary disease, aortic stenosis, diabetes, ovarian cancer, TIA and prior pulmonary embolism. She's been treated for obstructive sleep apnea unable to tolerate CPAP states that the machine is broken, followed by [facility] and Dr Annamaria Boots.  CT scan of the chest from 01/11/15 showed her small pulmonary embolism as well as calcified granulomas in the  spleen. There were no pulmonary nodules. She is on oxygen at . Her spirometry 2014 shows restriction and diffusion defect.  Since last time she has had a lot of stressors - has lived with each of her children, one of whom has drug use problem. She is now with her oldest son. She states that her breathing has been impacted to some degree. She is using O2 at 3L/min most of the time, notices more dyspnea when she does not wear it.  Having nasal gtt, no longer using astelin nasal spray  Her last TTE was reviewed [id number] > normal systolic fxn, normal RV fxn.   Review of Systems  Constitutional: Negative for fever and unexpected weight change.  HENT: Positive for congestion and postnasal drip. Negative for dental problem, ear pain, nosebleeds, rhinorrhea, sinus pressure, sneezing, sore throat and trouble swallowing.   Eyes: Negative for redness and itching.  Respiratory: Positive for cough. Negative for chest  tightness, shortness of breath and wheezing.   Cardiovascular: Negative for palpitations and leg swelling.  Gastrointestinal: Negative for nausea, vomiting and diarrhea.  Genitourinary: Negative for dysuria.  Musculoskeletal: Negative for joint swelling.  Skin: Positive for rash.  Neurological: Negative for dizziness, light-headedness and headaches.  Hematological: Does not bruise/bleed easily.  Psychiatric/Behavioral: Negative for dysphoric mood. The patient is not nervous/anxious.     06/21/12 --  Comparison: Chest x-ray of 05/24/2012  Findings: On the second contrast injection the pulmonary arteries  are moderately well opacified. The only questionable abnormality  is a tiny filling defect within a sub segmental branch of the  pulmonary artery to the left upper lobe. A very small pulmonary  embolism in this subsegmental branch cannot be excluded. No other  filling defect is seen throughout the pulmonary arterial system.  The thoracic aorta opacifies with no acute abnormality. The  origins of the great vessels are patent. No mediastinal or hilar  adenopathy is seen. Imaging through the upper abdomen, there is  diffuse fatty infiltration of the liver and the liver appears  enlarged. Surgical clips are present from prior cholecystectomy.  On the lung window images, no lung parenchymal lesion is seen. No  infiltrate is noted. No pleural effusion is noted. Incidental  calcified splenic granuloma are present from prior granulomatous  disease. No bony abnormality is seen.  IMPRESSION:  1. Tiny pulmonary embolus within a subsegmental branch of the left  upper lobe pulmonary artery. No other filling defect is seen.  2. No infiltrate, effusion, or nodule.  3. Diffuse fatty infiltration of the liver. Suspect hepatomegaly  4. Calcified splenic granulomas from prior granulomatous disease.       Objective:   Physical Exam Filed Vitals:   02/22/16 1057  BP: 122/86  Pulse: 85  Height:  5\' 6"  (1.676 m)  Weight: 182 lb (82.555 kg)  SpO2: 98%   Gen: Pleasant, overwt, in no distress, reserved affect  ENT: No lesions,  mouth clear,  oropharynx clear, no postnasal drip  Neck: No JVD, no TMG, no carotid bruits, no stridor  Lungs: No use of accessory muscles, no dullness to percussion, clear without rales or rhonchi  Cardiovascular: RRR, heart sounds normal, no murmur or gallops, no peripheral edema  Musculoskeletal: No deformities, no cyanosis or clubbing  Neuro: alert, non focal  Skin: Warm, no lesions or rashes     Assessment & Plan:  Chronic hypoxemic respiratory failure (HCC) This appears to be multifactorial in the setting of restrictive disease without interstitial disease on imaging, history of pulmonary  embolism, untreated obstructive sleep apnea, obesity. Her echocardiogram does not show evidence for associated pulmonary hypertension. At this time I believe she needs to continue her oxygen at 3 L/m. We will follow her clinically, follow chest x-ray. She does need to restart her CPAP and she is willing to do this when the equipment is obtained.   Please continue your oxygen at 3L/min We will ask [facility] to evaluate your oxygen tanks and concentrator to insure operating correctly.  You may benefit from restarting your astelin nasal spray, 2 sprays each nostril 2-3x a day.   You will benefit from restarting your CPAP every night. We will try to clarify getting the equipment that you need Follow with Dr Lamonte Sakai in 12 months or sooner if you have any problems  Obstructive apnea Not using CPAP at this time. I recommended that we follow up again with [facility] to see if we can get her equipment as she needs it.   Baltazar Apo, MD, PhD 02/22/2016, 11:41 AM [facility] [phone] or if no answer [phone]

## 2016-02-22 NOTE — Assessment & Plan Note (Signed)
Not using CPAP at this time. I recommended that we follow up again with [facility] to see if we can get her equipment as she needs it.

## 2016-02-23 LAB — BASIC METABOLIC PANEL
BUN: 33 mg/dL — AB (ref 4–21)
CREATININE: 1.1 mg/dL (ref <=1.1)

## 2016-02-23 LAB — HEMOGLOBIN A1C: Hemoglobin A1C: 7.8

## 2016-03-08 ENCOUNTER — Encounter: Payer: Self-pay | Admitting: Family Medicine

## 2016-03-08 ENCOUNTER — Ambulatory Visit (INDEPENDENT_AMBULATORY_CARE_PROVIDER_SITE_OTHER): Payer: Medicare Other | Admitting: Family Medicine

## 2016-03-08 VITALS — BP 153/84 | HR 91 | Ht 64.5 in | Wt 182.5 lb

## 2016-03-08 DIAGNOSIS — M1990 Unspecified osteoarthritis, unspecified site: Secondary | ICD-10-CM

## 2016-03-08 DIAGNOSIS — I10 Essential (primary) hypertension: Secondary | ICD-10-CM

## 2016-03-08 DIAGNOSIS — E1142 Type 2 diabetes mellitus with diabetic polyneuropathy: Secondary | ICD-10-CM

## 2016-03-08 DIAGNOSIS — E782 Mixed hyperlipidemia: Secondary | ICD-10-CM

## 2016-03-08 DIAGNOSIS — R413 Other amnesia: Secondary | ICD-10-CM

## 2016-03-08 DIAGNOSIS — N189 Chronic kidney disease, unspecified: Secondary | ICD-10-CM

## 2016-03-08 DIAGNOSIS — R5383 Other fatigue: Secondary | ICD-10-CM

## 2016-03-08 DIAGNOSIS — F325 Major depressive disorder, single episode, in full remission: Secondary | ICD-10-CM

## 2016-03-08 DIAGNOSIS — E669 Obesity, unspecified: Secondary | ICD-10-CM

## 2016-03-08 DIAGNOSIS — H04123 Dry eye syndrome of bilateral lacrimal glands: Secondary | ICD-10-CM

## 2016-03-08 DIAGNOSIS — Z79899 Other long term (current) drug therapy: Secondary | ICD-10-CM

## 2016-03-08 DIAGNOSIS — R5381 Other malaise: Secondary | ICD-10-CM

## 2016-03-08 DIAGNOSIS — I639 Cerebral infarction, unspecified: Secondary | ICD-10-CM

## 2016-03-08 MED ORDER — METFORMIN HCL ER 500 MG PO TB24: ORAL_TABLET | ORAL | Status: DC

## 2016-03-08 NOTE — Progress Notes (Signed)
Created in error- see other note from today

## 2016-03-08 NOTE — Patient Instructions (Addendum)
Cut back on diet Dr. Malachi Bonds and drink to a goal of 90 ounces of water per day-  5-6 bottles of water per day.  Please, bring back a list of all of your specialists and hand it to Dorothea Ogle or Kenney Houseman so they can put it in your medical records- and sign papers to release med records  We will start a low dose once a day metformin in hopes that it will not upset her stomach and you will get better control of your blood sugars.  Continue to monitor fasting blood sugars, first morning blood sugars ( should be less than 130-140 for your age and past med hx) and if possible 2 hours after largest meal of the day.        Diabetes and Exercise Exercising regularly is important. It is not just about losing weight. It has many health benefits, such as:  Improving your overall fitness, flexibility, and endurance.  Increasing your bone density.  Helping with weight control.  Decreasing your body fat.  Increasing your muscle strength.  Reducing stress and tension.  Improving your overall health. People with diabetes who exercise gain additional benefits because exercise:  Reduces appetite.  Improves the body's use of blood sugar (glucose).  Helps lower or control blood glucose.  Decreases blood pressure.  Helps control blood lipids (such as cholesterol and triglycerides).  Improves the body's use of the hormone insulin by:  Increasing the body's insulin sensitivity.  Reducing the body's insulin needs.  Decreases the risk for heart disease because exercising:  Lowers cholesterol and triglycerides levels.  Increases the levels of good cholesterol (such as high-density lipoproteins [HDL]) in the body.  Lowers blood glucose levels. YOUR ACTIVITY PLAN  Choose an activity that you enjoy, and set realistic goals. To exercise safely, you should begin practicing any new physical activity slowly, and gradually increase the intensity of the exercise over time. Your health care provider or  diabetes educator can help create an activity plan that works for you. General recommendations include:  Encouraging children to engage in at least 60 minutes of physical activity each day.  Stretching and performing strength training exercises, such as yoga or weight lifting, at least 2 times per week.  Performing a total of at least 150 minutes of moderate-intensity exercise each week, such as brisk walking or water aerobics.  Exercising at least 3 days per week, making sure you allow no more than 2 consecutive days to pass without exercising.  Avoiding long periods of inactivity (90 minutes or more). When you have to spend an extended period of time sitting down, take frequent breaks to walk or stretch. RECOMMENDATIONS FOR EXERCISING WITH TYPE 1 OR TYPE 2 DIABETES   Check your blood glucose before exercising. If blood glucose levels are greater than 240 mg/dL, check for urine ketones. Do not exercise if ketones are present.  Avoid injecting insulin into areas of the body that are going to be exercised. For example, avoid injecting insulin into:  The arms when playing tennis.  The legs when jogging.  Keep a record of:  Food intake before and after you exercise.  Expected peak times of insulin action.  Blood glucose levels before and after you exercise.  The type and amount of exercise you have done.  Review your records with your health care provider. Your health care provider will help you to develop guidelines for adjusting food intake and insulin amounts before and after exercising.  If you take insulin or oral hypoglycemic  agents, watch for signs and symptoms of hypoglycemia. They include:  Dizziness.  Shaking.  Sweating.  Chills.  Confusion.  Drink plenty of water while you exercise to prevent dehydration or heat stroke. Body water is lost during exercise and must be replaced.  Talk to your health care provider before starting an exercise program to make sure it  is safe for you. Remember, almost any type of activity is better than none.   This information is not intended to replace advice given to you by your health care provider. Make sure you discuss any questions you have with your health care provider.   Document Released: 11/04/2003 Document Revised: 12/29/2014 Document Reviewed: 01/21/2013 Elsevier Interactive Patient Education 2016 [employer].  Diabetes Mellitus and Food It is important for you to manage your blood sugar (glucose) level. Your blood glucose level can be greatly affected by what you eat. Eating healthier foods in the appropriate amounts throughout the day at about the same time each day will help you control your blood glucose level. It can also help slow or prevent worsening of your diabetes mellitus. Healthy eating may even help you improve the level of your blood pressure and reach or maintain a healthy weight.  General recommendations for healthful eating and cooking habits include:  Eating meals and snacks regularly. Avoid going long periods of time without eating to lose weight.  Eating a diet that consists mainly of plant-based foods, such as fruits, vegetables, nuts, legumes, and whole grains.  Using low-heat cooking methods, such as baking, instead of high-heat cooking methods, such as deep frying. Work with your dietitian to make sure you understand how to use the Nutrition Facts information on food labels. HOW CAN FOOD AFFECT ME? Carbohydrates Carbohydrates affect your blood glucose level more than any other type of food. Your dietitian will help you determine how many carbohydrates to eat at each meal and teach you how to count carbohydrates. Counting carbohydrates is important to keep your blood glucose at a healthy level, especially if you are using insulin or taking certain medicines for diabetes mellitus. Alcohol Alcohol can cause sudden decreases in blood glucose (hypoglycemia), especially if you use insulin or  take certain medicines for diabetes mellitus. Hypoglycemia can be a life-threatening condition. Symptoms of hypoglycemia (sleepiness, dizziness, and disorientation) are similar to symptoms of having too much alcohol.  If your health care provider has given you approval to drink alcohol, do so in moderation and use the following guidelines:  Women should not have more than one drink per day, and men should not have more than two drinks per day. One drink is equal to:  12 oz of beer.  5 oz of wine.  1 oz of hard liquor.  Do not drink on an empty stomach.  Keep yourself hydrated. Have water, diet soda, or unsweetened iced tea.  Regular soda, juice, and other mixers might contain a lot of carbohydrates and should be counted. WHAT FOODS ARE NOT RECOMMENDED? As you make food choices, it is important to remember that all foods are not the same. Some foods have fewer nutrients per serving than other foods, even though they might have the same number of calories or carbohydrates. It is difficult to get your body what it needs when you eat foods with fewer nutrients. Examples of foods that you should avoid that are high in calories and carbohydrates but low in nutrients include:  Trans fats (most processed foods list trans fats on the Nutrition Facts label).  Regular soda.  Juice.  Candy.  Sweets, such as cake, pie, doughnuts, and cookies.  Fried foods. WHAT FOODS CAN I EAT? Eat nutrient-rich foods, which will nourish your body and keep you healthy. The food you should eat also will depend on several factors, including:  The calories you need.  The medicines you take.  Your weight.  Your blood glucose level.  Your blood pressure level.  Your cholesterol level. You should eat a variety of foods, including:  Protein.  Lean cuts of meat.  Proteins low in saturated fats, such as fish, egg whites, and beans. Avoid processed meats.  Fruits and vegetables.  Fruits and vegetables  that may help control blood glucose levels, such as apples, mangoes, and yams.  Dairy products.  Choose fat-free or low-fat dairy products, such as milk, yogurt, and cheese.  Grains, bread, pasta, and rice.  Choose whole grain products, such as multigrain bread, whole oats, and brown rice. These foods may help control blood pressure.  Fats.  Foods containing healthful fats, such as nuts, avocado, olive oil, canola oil, and fish. DOES EVERYONE WITH DIABETES MELLITUS HAVE THE SAME MEAL PLAN? Because every person with diabetes mellitus is different, there is not one meal plan that works for everyone. It is very important that you meet with a dietitian who will help you create a meal plan that is just right for you.   This information is not intended to replace advice given to you by your health care provider. Make sure you discuss any questions you have with your health care provider.   Document Released: 05/11/2005 Document Revised: 09/04/2014 Document Reviewed: 07/11/2013 Elsevier Interactive Patient Education [employer].

## 2016-03-18 ENCOUNTER — Encounter: Payer: Self-pay | Admitting: Family Medicine

## 2016-03-18 DIAGNOSIS — C569 Malignant neoplasm of unspecified ovary: Secondary | ICD-10-CM | POA: Insufficient documentation

## 2016-03-18 DIAGNOSIS — I350 Nonrheumatic aortic (valve) stenosis: Secondary | ICD-10-CM | POA: Insufficient documentation

## 2016-03-18 DIAGNOSIS — Z8619 Personal history of other infectious and parasitic diseases: Secondary | ICD-10-CM | POA: Insufficient documentation

## 2016-03-18 DIAGNOSIS — G8929 Other chronic pain: Secondary | ICD-10-CM | POA: Insufficient documentation

## 2016-03-18 DIAGNOSIS — I2510 Atherosclerotic heart disease of native coronary artery without angina pectoris: Secondary | ICD-10-CM | POA: Insufficient documentation

## 2016-03-18 DIAGNOSIS — R51 Headache: Secondary | ICD-10-CM

## 2016-03-18 DIAGNOSIS — E669 Obesity, unspecified: Secondary | ICD-10-CM

## 2016-03-18 DIAGNOSIS — J449 Chronic obstructive pulmonary disease, unspecified: Secondary | ICD-10-CM | POA: Insufficient documentation

## 2016-03-18 DIAGNOSIS — H04123 Dry eye syndrome of bilateral lacrimal glands: Secondary | ICD-10-CM | POA: Insufficient documentation

## 2016-03-18 HISTORY — DX: Obesity, unspecified: E66.9

## 2016-03-18 NOTE — Progress Notes (Signed)
[name] [name], D.O. Primary care at [place]:    Chief Complaint  Patient presents with  . Establish Care   New pt, here to establish care.   HPI: [name] [name] is a pleasant 65 y.o. female who presents to [place] at [facility] today to become established.  Very complicated and extensive past medical history which I attempted to review with patient today.    Diabetes mellitus: Type II.-  Patient does not check her sugars.  She complains of polyuria, no polydipsia.  Has chronic neuralgias peripherally from her diabetes.  Last A1c was 7.8 on 02/23/16.   BUN of 33   Serum creatinine 1.1.  Pt states she was never on metformin; can't recall if she ever was.   Willing to try it despite long d/c her re: possible s-e   PCP prior:     Vicenta Aly, NP (last seen 02/23/16)  [place];  [place]   [phone] ( from March 06, 2012-current treating primarily patient's diabetes and performing routine health maintenance screening tests.)    Patient has multiple specialists who essentially treat the bulk of her diseases:  Cardiovascular physician: Dr. Dorris Carnes of [place] ( CAD, h/o CHF, AS, carotid artery stenosis, history CVA and TIA without sequelae, chronic dizziness, HTN, HLD)  UrologyGYN:   Review of chart reveals recent encounter on 6\7\2017 with Dr. Alta Corning- [facility] and f/up on 03/22/16.   (chronic mixed incontinence- urine and feces)  [facility]: Arthritic needs;  Dr Latanya Maudlin  ( multiple issues- multiple jts)  Pulmonary: ? Name but review of chart reveals Drs Annamaria Boots and [place] ( chronic hypoxia on home CO2, COPD, obstructive sleep apnea, daytime hypersomnolence,  Neurologist:   [facility], Dr.  Sarina Ill & Dr Izora Ribas ( chronic dizziness, chronic HA's, history of TIA and stroke, memory  issues)  Gastroenterologist:  Dr Kootenai Cellar  & Dr Erskine Emery & Dr Carlean Purl ( chronic dysphagia, chronic hep C virus infection, gastroparesis, chronic nausea, GERD, esophageal stricture, chronic diarrhea, internal hemorrhoids- chronic blood in stool)   Psychiatrist:  [facility] ( major depressive disorder, PTSD, anxiety, panic attacks)  Neuropsychiatrist:  ? name      Extracted from recent PCPs notes:    POCT Hemoglobin A1C (02/23/2016 8:57 AM)  Component Value Ref Range  Hemoglobin A1c 7.8 (A) 4.8 - 5.6 %   POCT Hemoglobin A1C (02/23/2016 8:57 AM)  Specimen Performing Laboratory  [facility]  [address]  [place], [place] [address]   Back to top of Results   PULMONOLOGY CONSULTATION (02/22/2016) Back to top of Results   Labs (02/15/2016) Back to top of Results   BUN (02/14/2016) BUN (02/14/2016)  Component Value Ref Range  BUN 33 (A) 5 - 26 mg/dL   BUN (02/14/2016)  Specimen Performing Laboratory  Blood    Back to top of Results   ALT (02/14/2016) ALT (02/14/2016)  Component Value Ref Range  ALT 67 13 - 69 U/L   ALT (02/14/2016)  Specimen Performing Laboratory  Blood    Back to top of Results   Sodium (02/14/2016) Sodium (02/14/2016)  Component Value Ref Range  Sodium 140 135 - 148 mEg/L   Sodium (02/14/2016)  Specimen Performing Laboratory  Blood    Back to top of Results   Potassium (  02/14/2016) Potassium (02/14/2016)  Component Value Ref Range  Potassium 4.6 3.5 - 5.5 mmol/L   Potassium (02/14/2016)  Specimen Performing Laboratory  Blood    Back to top of Results   Glucose, Random (02/14/2016) Glucose, Random (02/14/2016)  Component Value Ref Range  Glucose, POC 177 (A) 65 - 99 mg/dL   Glucose, Random (02/14/2016)  Specimen Performing Laboratory  Blood    Back to top of Results   Creatinine, Serum (02/14/2016) Creatinine, Serum  (02/14/2016)  Component Value Ref Range  Creatinine, POC 1.1 0.8 - 1.3 mg/dL   Creatinine, Serum (02/14/2016)  Specimen Performing Laboratory  Blood    Back to top of Results   Lipid Panel (02/14/2016) Lipid Panel (02/14/2016)  Component Value Ref Range  Cholesterol 151 100 - 199 mg/dL  HDL 49 39 mg/dl  Triglycerides 306 (A) 0 - 149 mg/dL  LDL 41 0 - 99 mg/dL        Past Medical History  Diagnosis Date  . Hyperlipidemia   . Benign neoplasm of stomach   . Dizziness and giddiness   . Gastroparesis   . Esophageal reflux   . CAD (coronary artery disease)     a.  cath 2005: LAD 40%;  b. Myoview 3/12: EF 70%, no ischemia;  c. Myoview 9/13: EF 76%, no ischemia  . HTN (hypertension)   . Palpitations   . Depression   . Aortic stenosis     Echo 8/13: EF 55-60%, Mild LVH, mild AS, mean gradient 12 mmHg,   . PONV (postoperative nausea and vomiting)   . TIA (transient ischemic attack) 2015  . Pulmonary embolism ([place])   . Carotid stenosis     right, moderate/notes 07/01/2015  . Esophageal stricture   . Family history of adverse reaction to anesthesia     "my sister gets sick"  . CHF (congestive heart failure) ([place]) 06-13-12    episode after Cancer surgery  . COPD (chronic obstructive pulmonary disease) ([place])   . On home oxygen therapy     "2L all the time" (07/11/2015)  . Chronic bronchitis ([place])     "get it ~ q yr"  (07/11/2015)  . Sleep apnea 06-13-12    "used to wear CPAP; can't sleep w/it" (07/01/2015) cpap setting of 2 when used cpap  . Type II diabetes mellitus ([place])   . Anemia   . History of blood transfusion     "when I had my hemorrhoid OR"  . Hepatitis C     "still active" (07/01/2015)  . Daily headache   . Migraine     "~ 5 times/wk" (07/01/2015)  . Arthritis     "qwhere; especially in my back and hands" (07/01/2015)  . Fibromyalgia   . Chronic lower back pain   . Anxiety   . Chronic kidney disease     "my kidneys have been about to shut down" (07/01/2015)   . Ovarian cancer ([place]) 2000; 2002  . Chronic headaches       Past Surgical History  Procedure Laterality Date  . Tubal ligation    . Oophorectomy      1 ovary removed, then later last ovary removed  . Anterior and posterior vaginal repair    . Knee arthroscopy Left   . Cataract extraction w/ intraocular lens  implant, bilateral Bilateral 03/2013  . Breast biopsy Left 1972  . Colonoscopy N/A 08/11/2013    Procedure: COLONOSCOPY;  Surgeon: Inda Castle, MD;  Location: [facility] ENDOSCOPY;  Service: Endoscopy;  Laterality:  N/A;  . Esophagogastroduodenoscopy (egd) with esophageal dilation      Archie Endo 07/01/2015  . Appendectomy    . Cholecystectomy open    . Abdominal hysterectomy    . Band hemorrhoidectomy    . Back surgery    . Lumbar disc surgery    . Dilation and curettage of uterus    . Tonsillectomy  ~ 1958  . Colonoscopy N/A 12/08/2015    Procedure: COLONOSCOPY;  Surgeon: Manus Gunning, MD;  Location: [facility];  Service: Gastroenterology;  Laterality: N/A;      Family History  Problem Relation Age of Onset  . Brain cancer Father     spine cancer  . Heart disease Mother   . Hypertension Mother   . Breast cancer Sister   . Kidney disease Sister   . Cancer Sister     breast  . Hypertension Sister   . Diabetes Brother   . Esophageal cancer Brother   . Diabetes Maternal Aunt   . Diabetes Paternal Aunt   . Stomach cancer Paternal Grandfather   . Colon cancer Neg Hx   . Alcohol abuse Son   . Hyperlipidemia Son   . Hypertension Son   . Cancer Sister     breast      History  Drug Use No    Comment: 07/01/2015 "qut in 2000; tried pot, didn't enjoy it"  ,    History  Alcohol Use No    Comment: 07/01/2015 "stopped  in before 2000"  ,    History  Smoking status  . Never Smoker   Smokeless tobacco  . Never Used  ,     History  Sexual Activity  . Sexual Activity: No      Patient's Medications  New Prescriptions   No medications on file   Previous Medications   ASPIRIN 325 MG TABLET    Take 1 tablet (325 mg total) by mouth daily.   ATORVASTATIN (LIPITOR) 10 MG TABLET    Take 1 tablet (10 mg total) by mouth daily.   AZELASTINE (ASTELIN) 0.1 % NASAL SPRAY    2 sprays each nostril four times daily for 3-5 days then twice a day   B-D ULTRA-FINE 33 LANCETS MISC    TEST BLOOD GLUCOSE 2 x daily   BISOPROLOL (ZEBETA) 5 MG TABLET    TAKE ONE TABLET BY MOUTH ONCE DAILY   CYCLOSPORINE (RESTASIS) 0.05 % OPHTHALMIC EMULSION    Place 1 drop into both eyes 2 (two) times daily.   DULOXETINE (CYMBALTA) 30 MG CAPSULE    Take 30 mg by mouth 2 (two) times daily.    GABAPENTIN (NEURONTIN) 300 MG CAPSULE    Take 1 capsule (300 mg total) by mouth 2 (two) times daily.   GEMFIBROZIL (LOPID) 600 MG TABLET    Take 600 mg by mouth 2 (two) times daily before a meal.   GLIMEPIRIDE (AMARYL) 4 MG TABLET    Take 8 mg by mouth daily with breakfast.    LISINOPRIL (PRINIVIL,ZESTRIL) 20 MG TABLET    Take 20 mg by mouth daily.   ONDANSETRON (ZOFRAN) 8 MG TABLET    Take 1 tablet (8 mg total) by mouth every 8 (eight) hours as needed for nausea or vomiting.   OXYGEN-HELIUM IN    2-3 L by Other route continuous.    PANTOPRAZOLE (PROTONIX) 40 MG TABLET    TAKE ONE TABLET BY MOUTH ONCE DAILY   SITAGLIPTIN (JANUVIA) 100 MG TABLET    Take 1  tablet by mouth daily.   SOLIFENACIN (VESICARE) 5 MG TABLET    Take 5 mg by mouth daily.    TRAZODONE (DESYREL) 50 MG TABLET    Take 50 mg by mouth at bedtime as needed for sleep.   Modified Medications   No medications on file  Discontinued Medications   COLESTIPOL (COLESTID) 1 G TABLET    TAKE ONE TABLET BY MOUTH TWICE DAILY   CYCLOSPORINE (RESTASIS) 0.05 % OPHTHALMIC EMULSION    Place 1 drop into both eyes 2 (two) times daily.    JANUVIA 100 MG TABLET    Take 100 mg by mouth daily.      Sunflower oil; Iodine; Metoclopramide hcl; Sulfamethoxazole; Shellfish allergy; and Sulfamethoxazole-trimethoprim     Immunization History   Administered Date(s) Administered  . Influenza Split 05/28/2013  . Influenza-Unspecified 06/17/2015  . Pneumococcal Polysaccharide-23 08/29/2011     Review of Systems:   ( Completed via Adult Medical History Intake form today see scanned in sheet for further details- ) - Pan-negative except for generalized weakness, unexplained weight gain, difficulty hearing, chronic palpitations, chronic nausea and diarrhea, nighttime urination, leaking urine, muscle/ joint pains, headaches, memory loss, anxiety, sleep problem, depression.   - Denies changes in vision, hayfever or allergy symptoms, chest pain or discomfort, breast lump or nipple discharge, cough or wheeze, blood in bowel movements, unusual vaginal bleeding or discharge, skin changes tomorrow or new rash, unexplained lumps or easy bruising\bleeding, concerned with sexual function.   Objective:   Blood pressure 153/84, pulse 91, height 5' 4.5" (1.638 m), weight 182 lb 8 oz (82.781 kg), SpO2 98 %. Body mass index is 30.85 kg/(m^2).  General: Well Developed, well nourished, and in no acute distress.  Neuro: Alert and oriented x3, extra-ocular muscles intact, sensation grossly intact.  HEENT: Normocephalic, atraumatic, pupils equal round reactive to light, neck supple, no gross masses, no carotid bruits, no JVD apprec Skin: no gross suspicious lesions or rashes  Cardiac: Regular rate and rhythm, no murmurs rubs or gallops appreciated.  Respiratory: Essentially clear to auscultation bilaterally. Not using accessory muscles, speaking in full sentences.  Abdominal: Soft, not grossly distended Musculoskeletal: Ambulates w/o diff, FROM * 4 ext.  Vasc: less 2 sec cap RF, warm and pink  Psych:  No HI/SI, judgement and insight good.    Impression and Recommendations:    1. Type 2 diabetes mellitus with peripheral neuropathy (HCC)   2. Essential hypertension   3. Mixed hyperlipidemia   4. Obesity   5. Depression, major, in remission ([place])    6. Chronic kidney disease, unspecified stage   7. Generalized OsteoArthritis   8. Malaise and fatigue: Frailty, chronic deconditioning   9. Polypharmacy   10. Bad memory   - Pt states she was never on metformin; can't recall if she ever was.   Willing to try it despite long d/c her re: possible s-e - f/up 4 wks for f/up DM and starting new metformin  The patient was counseled, risk factors were discussed, anticipatory guidance given.   Pt was in the office today for 40+ minutes, with over 50% time spent in face to face counseling of various medical issues, review of her chart/medical issues and in coordination of care.   Gross side effects, risk and benefits, and alternatives of medications discussed with patient.  Patient is aware that all medications have potential side effects and we are unable to predict every side effect or drug-drug interaction that may occur.  Expresses verbal understanding and  consents to current therapy plan and treatment regimen.  Please see AVS handed out to patient at the end of our visit for further patient instructions/ counseling done pertaining to today's office visit.    Note: This document was prepared using Dragon voice recognition software and may include unintentional dictation errors.

## 2016-04-03 ENCOUNTER — Encounter: Payer: Self-pay | Admitting: Sports Medicine

## 2016-04-03 ENCOUNTER — Ambulatory Visit (INDEPENDENT_AMBULATORY_CARE_PROVIDER_SITE_OTHER): Payer: Medicare Other | Admitting: Sports Medicine

## 2016-04-03 DIAGNOSIS — B351 Tinea unguium: Secondary | ICD-10-CM

## 2016-04-03 DIAGNOSIS — E1142 Type 2 diabetes mellitus with diabetic polyneuropathy: Secondary | ICD-10-CM | POA: Diagnosis not present

## 2016-04-03 DIAGNOSIS — M79671 Pain in right foot: Secondary | ICD-10-CM | POA: Diagnosis not present

## 2016-04-03 DIAGNOSIS — M79672 Pain in left foot: Secondary | ICD-10-CM | POA: Diagnosis not present

## 2016-04-03 NOTE — Progress Notes (Signed)
Subjective: [name] [name] is a 65 y.o. female patient with history of diabetes who presents to office today complaining of long, painful nails  while ambulating in shoes; unable to trim. Patient states that the glucose reading this morning was <200 mg/dl. Patient denies any new changes in medication or new problems. Patient denies any new cramping, numbness, burning or tingling in the legs.  There are no active problems to display for this patient.  No current outpatient prescriptions on file prior to visit.   No current facility-administered medications on file prior to visit.    Allergies  Allergen Reactions  . Reglan [Metoclopramide]   . Sulfa Antibiotics     No results found for this or any previous visit (from the past 2160 hour(s)).  Objective: General: Patient is awake, alert, and oriented x 3 and in no acute distress.  Integument: Skin is warm, dry and supple bilateral. Nails are tender, long, thickened and dystrophic with subungual debris, consistent with onychomycosis, 1-5 bilateral. No signs of infection. No open lesions or preulcerative lesions present bilateral. Remaining integument unremarkable.  Vasculature:  Dorsalis Pedis pulse 1/4 bilateral. Posterior Tibial pulse  1/4 bilateral. Capillary fill time <3 sec 1-5 bilateral. Scant hair growth to the level of the digits.Temperature gradient within normal limits. No varicosities present bilateral. No edema present bilateral. Subjective tingling to both feet.  Neurology: The patient has intact sensation measured with a 5.07/10g Semmes Weinstein Monofilament at all pedal sites bilateral . Vibratory sensation diminished bilateral with tuning fork. No Babinski sign present bilateral.   Musculoskeletal: No symptomatic pedal deformities noted bilateral. Muscular strength 5/5 in all lower extremity muscular groups bilateral without pain on range of motion . No tenderness with calf compression bilateral.  Assessment and Plan: Problem  List Items Addressed This Visit    None    Visit Diagnoses    Dermatophytosis of nail    -  Primary   Foot pain, bilateral       Diabetic polyneuropathy associated with type 2 diabetes mellitus ([place])          -Examined patient. -Discussed and educated patient on diabetic foot care, especially with  regards to the vascular, neurological and musculoskeletal systems.  -Stressed the importance of good glycemic control and the detriment of not  controlling glucose levels in relation to the foot. -Mechanically debrided all nails 1-5 bilateral using sterile nail nipper and filed with dremel without incident  -Answered all patient questions -Patient to return  in 3 months for at risk foot care -Patient advised to call the office if any problems or questions arise in the meantime.  Landis Martins, DPM

## 2016-04-03 NOTE — Patient Instructions (Signed)
Diabetes and Foot Care Diabetes may cause you to have problems because of poor blood supply (circulation) to your feet and legs. This may cause the skin on your feet to become thinner, break easier, and heal more slowly. Your skin may become dry, and the skin may peel and crack. You may also have nerve damage in your legs and feet causing decreased feeling in them. You may not notice minor injuries to your feet that could lead to infections or more serious problems. Taking care of your feet is one of the most important things you can do for yourself.  HOME CARE INSTRUCTIONS  Wear shoes at all times, even in the house. Do not go barefoot. Bare feet are easily injured.  Check your feet daily for blisters, cuts, and redness. If you cannot see the bottom of your feet, use a mirror or ask someone for help.  Wash your feet with warm water (do not use hot water) and mild soap. Then pat your feet and the areas between your toes until they are completely dry. Do not soak your feet as this can dry your skin.  Apply a moisturizing lotion or petroleum jelly (that does not contain alcohol and is unscented) to the skin on your feet and to dry, brittle toenails. Do not apply lotion between your toes.  Trim your toenails straight across. Do not dig under them or around the cuticle. File the edges of your nails with an emery board or nail file.  Do not cut corns or calluses or try to remove them with medicine.  Wear clean socks or stockings every day. Make sure they are not too tight. Do not wear knee-high stockings since they may decrease blood flow to your legs.  Wear shoes that fit properly and have enough cushioning. To break in new shoes, wear them for just a few hours a day. This prevents you from injuring your feet. Always look in your shoes before you put them on to be sure there are no objects inside.  Do not cross your legs. This may decrease the blood flow to your feet.  If you find a minor scrape,  cut, or break in the skin on your feet, keep it and the skin around it clean and dry. These areas may be cleansed with mild soap and water. Do not cleanse the area with peroxide, alcohol, or iodine.  When you remove an adhesive bandage, be sure not to damage the skin around it.  If you have a wound, look at it several times a day to make sure it is healing.  Do not use heating pads or hot water bottles. They may burn your skin. If you have lost feeling in your feet or legs, you may not know it is happening until it is too late.  Make sure your health care provider performs a complete foot exam at least annually or more often if you have foot problems. Report any cuts, sores, or bruises to your health care provider immediately. SEEK MEDICAL CARE IF:   You have an injury that is not healing.  You have cuts or breaks in the skin.  You have an ingrown nail.  You notice redness on your legs or feet.  You feel burning or tingling in your legs or feet.  You have pain or cramps in your legs and feet.  Your legs or feet are numb.  Your feet always feel cold. SEEK IMMEDIATE MEDICAL CARE IF:   There is increasing redness,   swelling, or pain in or around a wound.  There is a red line that goes up your leg.  Pus is coming from a wound.  You develop a fever or as directed by your health care provider.  You notice a bad smell coming from an ulcer or wound.   This information is not intended to replace advice given to you by your health care provider. Make sure you discuss any questions you have with your health care provider.   Document Released: 08/11/2000 Document Revised: 04/16/2013 Document Reviewed: 01/21/2013 Elsevier Interactive Patient Education 2016 Elsevier Inc[employer]  

## 2016-04-05 ENCOUNTER — Encounter: Payer: Self-pay | Admitting: Family Medicine

## 2016-04-05 ENCOUNTER — Ambulatory Visit (INDEPENDENT_AMBULATORY_CARE_PROVIDER_SITE_OTHER): Payer: Medicare Other | Admitting: Family Medicine

## 2016-04-05 VITALS — BP 145/82 | HR 108 | Wt 182.0 lb

## 2016-04-05 DIAGNOSIS — G4733 Obstructive sleep apnea (adult) (pediatric): Secondary | ICD-10-CM | POA: Diagnosis not present

## 2016-04-05 DIAGNOSIS — E1142 Type 2 diabetes mellitus with diabetic polyneuropathy: Secondary | ICD-10-CM | POA: Diagnosis not present

## 2016-04-05 DIAGNOSIS — I639 Cerebral infarction, unspecified: Secondary | ICD-10-CM | POA: Diagnosis not present

## 2016-04-05 DIAGNOSIS — R51 Headache: Secondary | ICD-10-CM

## 2016-04-05 DIAGNOSIS — I10 Essential (primary) hypertension: Secondary | ICD-10-CM

## 2016-04-05 DIAGNOSIS — E669 Obesity, unspecified: Secondary | ICD-10-CM

## 2016-04-05 DIAGNOSIS — G8929 Other chronic pain: Secondary | ICD-10-CM

## 2016-04-05 DIAGNOSIS — R519 Headache, unspecified: Secondary | ICD-10-CM

## 2016-04-05 MED ORDER — METFORMIN HCL ER 500 MG PO TB24: ORAL_TABLET | ORAL | 0 refills | Status: DC

## 2016-04-05 MED ORDER — INSULIN GLARGINE 100 UNIT/ML SOLOSTAR PEN: 20.0000 [IU] | PEN_INJECTOR | Freq: Every day | SUBCUTANEOUS | 99 refills | Status: DC

## 2016-04-05 NOTE — Progress Notes (Signed)
Impression and Recommendations:    1. Type 2 diabetes mellitus with peripheral neuropathy (HCC)   2. Obesity   3. Obstructive apnea   4. Chronic intractable headache, unspecified headache type   5. Benign essential HTN      We will have her back down on her metformin XL to a half a tablet daily. We are hoping this will help decrease her amount of diarrhea she is having. Encouraged her to eat first and wait an hour before taking medicine.   Also due to fasting blood sugars 170- 250's range, we will start Lantus 20 units every evening.  Handouts provided regarding blood glucose monitoring. --> To help prevent her from having another office visit and co-pay so soon, patient will call and leave a message of how her blood sugars are doing with Miss Tonya in 2-3 weeks.  We will adjust her Lantus as needed  Encouraged her to walk which she is not doing, prudent diet discussed.  Needs to drink more water which she is not doing.  Discussed with patient to follow-up with her pulmonologist or sleep apnea doctor as chronic headaches could be coming from that as well.  Encouraged her to follow-up with her neurologist regarding her chronic headache management.  Encouraged to wean herself off the excess caffeine she is on daily and drink more water.  Blood pressure is under much better control today. Encouraged patient to decrease salt intake and exercise more. Explained to her I would like blood pressure to remain under 140/90 on a regular basis.  Follow-up end of September for A1c and office visit with me, sooner if problems or concerns.  Patient's Medications  New Prescriptions   INSULIN GLARGINE (LANTUS SOLOSTAR) 100 UNIT/ML SOLOSTAR PEN    Inject 20 Units into the skin daily at 10 pm.  Previous Medications   ASPIRIN 325 MG TABLET    Take 1 tablet (325 mg total) by mouth daily.   ATORVASTATIN (LIPITOR) 10 MG TABLET    Take 1 tablet (10 mg total) by mouth daily.   AZELASTINE (ASTELIN) 0.1 %  NASAL SPRAY    2 sprays each nostril four times daily for 3-5 days then twice a day   B-D ULTRA-FINE 33 LANCETS MISC    TEST BLOOD GLUCOSE 2 x daily   BISOPROLOL (ZEBETA) 5 MG TABLET    TAKE ONE TABLET BY MOUTH ONCE DAILY   CYCLOSPORINE (RESTASIS) 0.05 % OPHTHALMIC EMULSION    Place 1 drop into both eyes 2 (two) times daily.   DULOXETINE (CYMBALTA) 30 MG CAPSULE    Take 30 mg by mouth 2 (two) times daily.    GABAPENTIN (NEURONTIN) 300 MG CAPSULE    Take 1 capsule (300 mg total) by mouth 2 (two) times daily.   GEMFIBROZIL (LOPID) 600 MG TABLET    Take 600 mg by mouth 2 (two) times daily before a meal.   GLIMEPIRIDE (AMARYL) 4 MG TABLET    Take 8 mg by mouth daily with breakfast.    LISINOPRIL (PRINIVIL,ZESTRIL) 20 MG TABLET    Take 20 mg by mouth daily.   ONDANSETRON (ZOFRAN) 8 MG TABLET    Take 1 tablet (8 mg total) by mouth every 8 (eight) hours as needed for nausea or vomiting.   OXYGEN-HELIUM IN    2-3 L by Other route continuous.    PANTOPRAZOLE (PROTONIX) 40 MG TABLET    TAKE ONE TABLET BY MOUTH ONCE DAILY   SITAGLIPTIN (JANUVIA) 100 MG TABLET  Take 1 tablet by mouth daily.   SOLIFENACIN (VESICARE) 5 MG TABLET    Take 5 mg by mouth daily.    TRAZODONE (DESYREL) 50 MG TABLET    Take 50 mg by mouth at bedtime as needed for sleep.   Modified Medications   Modified Medication Previous Medication   METFORMIN (GLUCOPHAGE XR) 500 MG 24 HR TABLET metFORMIN (GLUCOPHAGE XR) 500 MG 24 hr tablet      Use 1/2 tab q am    Use 1/2 tab q am for one week, then 1 tab q am.  Take after breakfast  Discontinued Medications   No medications on file    Return in about 7 weeks (around 05/25/2016) for Follow-up of current medical issues; will need A1c.  The patient was counseled, risk factors were discussed, anticipatory guidance given.  Gross side effects, risk and benefits, and alternatives of medications discussed with patient.  Patient is aware that all medications have potential side effects and we are  unable to predict every side effect or drug-drug interaction that may occur.  Expresses verbal understanding and consents to current therapy plan and treatment regimen.  Please see AVS handed out to patient at the end of our visit for further patient instructions/ counseling done pertaining to today's office visit.    Note: This document was prepared using Dragon voice recognition software and may include unintentional dictation errors.   --------------------------------------------------------------------------------------------------------------------------------------------------------------------------------------------------------------------------------------------    Subjective:    CC:  Chief Complaint  Patient presents with  . Diabetes    HPI: [name] [name] is a 65 y.o. female who presents to [place] at [facility] today for issues as discussed below- f/up DM.   Went to podiatry 2 d ago- checked feet re: DM and trimmed toenails.    Started on metformin XR last OV- Here for follow-up of this.   Having loose stools but feeling ok, no new nausea or upset stomach.  Symptoms became more prevalent when she went from a half of a tablet a day, to a full tablet.    She is even losing stool into her pants some days.   Pt states she can tolerate the 1/2 tab ok though.   FBS- 170-200.   Drinks Diet Dr Malachi Bonds and water.  havne't been so good with her water intake as we discussed last OV.  BS still very  elevated- upper 170-190's to 250-270.    --Patient declines that she'll be able to do much lifestyle change, or exercise, or dietary alterations to decrease her carbohydrate and sugar intake.   Wt Readings from Last 3 Encounters:  04/05/16 182 lb (82.6 kg)  03/08/16 182 lb 8 oz (82.8 kg)  02/22/16 182 lb (82.6 kg)   BP Readings from Last 3 Encounters:  04/05/16 (!) 145/82  03/08/16 (!) 153/84  02/22/16 122/86   Pulse Readings from Last 3 Encounters:  04/05/16 (!)  108  03/08/16 91  02/22/16 85     Patient Active Problem List   Diagnosis Date Noted  . CAD (coronary artery disease) 03/18/2016    Priority: High  . Chronic kidney disease 03/08/2016    Priority: High  . Type 2 diabetes mellitus with peripheral neuropathy (HCC)     Priority: High  . 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 h/o Acute pulmonary embolism ([place]) 06/21/2012    Priority: High  . HLD (hyperlipidemia) 06/27/2011    Priority: High  . CAROTID ARTERY STENOSIS, WITHOUT INFARCTION 11/08/2009    Priority: High  . Obesity 03/18/2016    Priority: Medium  . Depression 03/18/2016    Priority: Medium  . Bad memory 07/10/2015    Priority: Medium  . Mixed incontinence 07/10/2015    Priority: Medium  . Mixed hyperlipidemia 11/18/2009    Priority: Medium  .  chronic DIZZINESS 11/18/2008    Priority: Medium  . Esophageal reflux 10/02/2007    Priority: Medium  . Internal hemorrhoids 06/04/2014    Priority: Low  . h/o freq CANDIDIASIS, ORAL 08/25/2008    Priority: Low  . Benign essential HTN 04/05/2016  . COPD (chronic obstructive pulmonary disease) ([place]) 03/18/2016  . Chronic headaches 03/18/2016  . Arthritis 03/18/2016  . Aortic stenosis 03/18/2016  . History of genital warts 03/18/2016  . Chronic dryness of both eyes 03/18/2016  . Int Hemorrhoids: chronic blood in stool   . Chronic diarrhea   . New onset of headaches after age 52 09/15/2015  . Snoring 09/15/2015  . Daytime hypersomnolence 09/15/2015  . 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 Frailty 07/10/2015  . Altered mental state 07/01/2015  . Chronic hypoxic respiratory failure:  home O2 (Drs [place] and [place]) 08/04/2014  . Dependence on supplemental oxygen 08/04/2014  . Allergic  rhinitis 01/22/2014  . Anemia 11/19/2013  . CVA (cerebral infarction) 11/11/2013  . Hemiparesis ([place]) 11/11/2013  . Problem situation relating to social and personal history 09/18/2013  . Polypharmacy 07/02/2012  . Other long term (current) drug therapy 07/02/2012  . CHF (congestive heart failure) ([place]) 06/13/2012  . Sleep apnea 06/13/2012  . Fatty liver disease, nonalcoholic [id number]  . Obstructive apnea 09/08/2011  . Back pain, chronic 06/27/2011  . Depression, major, in remission ([place]) 06/27/2011  . Headache, migraine 06/27/2011  . Absence of bladder continence 06/27/2011  . Essential hypertension 06/27/2011  . Malaise and fatigue: Frailty, chronic deconditioning 11/08/2010  . HYPERURICEMIA 07/29/2009  . Chronic Diarrhea 07/27/2008  . POLYURIA 07/27/2008  . Chronic hepatitis C virus infection ([place]) 10/02/2007  . Gastroparesis 10/02/2007  . Dysphagia 10/02/2007    Past Medical history, Surgical history, Family history, Social history, Allergies and Medications have been entered into the medical record, reviewed and changed as needed.   Allergies:  Allergies  Allergen Reactions  . Sunflower Oil Anaphylaxis  . Iodine Nausea And Vomiting    Fish causes n/v  . Metoclopramide Hcl Other (See Comments)    Nerves tense up, panic attack  . Sulfamethoxazole Nausea And Vomiting  . Shellfish Allergy Nausea And Vomiting  . Sulfamethoxazole-Trimethoprim Nausea And Vomiting    Review of Systems: No fever/ chills, night sweats, no unintended weight loss, No chest pain, or increased shortness of breath. No N/V/D.  Pertinent positives and negatives noted in HPI above    Objective:   Blood pressure (!) 145/82, pulse (!) 108, weight 182 lb (82.6 kg). Body mass index is 30.76 kg/m.  General: Well Developed, well nourished, appropriate for stated age.  Neuro: Alert and oriented x3, extra-ocular muscles intact, sensation grossly intact.  HEENT: Normocephalic, atraumatic, neck supple     Skin: Warm and dry, no gross rash. Cardiac: RRR, S1 S2,  no murmurs rubs or gallops.  Respiratory: ECTA B/L, Not using accessory muscles, speaking in full sentences-unlabored. Vascular:  No gross lower ext edema, cap RF less 2 sec. Psych:  No SI/HI, Insight and judgement good

## 2016-04-05 NOTE — Patient Instructions (Addendum)
-Decrease your metformin to a half a tablet every morning. -Pick up her new prescription of long-acting insulin\Lantus at the pharmacy and start that this evening.  Patient will call in 2 weeks approximately and let Tonya know what her fasting blood sugars are and any other diabetes-related symptoms she may be having.   She will then follow-up and of September for an office visit with me.      Blood Glucose Monitoring, Adult Monitoring your blood glucose (also know as blood sugar) helps you to manage your diabetes. It also helps you and your health care provider monitor your diabetes and determine how well your treatment plan is working. WHY SHOULD YOU MONITOR YOUR BLOOD GLUCOSE?  It can help you understand how food, exercise, and medicine affect your blood glucose.  It allows you to know what your blood glucose is at any given moment. You can quickly tell if you are having low blood glucose (hypoglycemia) or high blood glucose (hyperglycemia).  It can help you and your health care provider know how to adjust your medicines.  It can help you understand how to manage an illness or adjust medicine for exercise. WHEN SHOULD YOU TEST? Your health care provider will help you decide how often you should check your blood glucose. This may depend on the type of diabetes you have, your diabetes control, or the types of medicines you are taking. Be sure to write down all of your blood glucose readings so that this information can be reviewed with your health care provider. See below for examples of testing times that your health care provider may suggest. Type 1 Diabetes  Test at least 2 times per day if your diabetes is well controlled, if you are using an insulin pump, or if you perform multiple daily injections.  If your diabetes is not well controlled or if you are sick, you may need to test more often.  It is a good idea to also test:  Before every insulin injection.  Before and after  exercise.  Between meals and 2 hours after a meal.  Occasionally between 2:00 a.m. and 3:00 a.m. Type 2 Diabetes  If you are taking insulin, test at least 2 times per day. However, it is best to test before every insulin injection.  If you take medicines by mouth (orally), test 2 times a day.  If you are on a controlled diet, test once a day.  If your diabetes is not well controlled or if you are sick, you may need to monitor more often. HOW TO MONITOR YOUR BLOOD GLUCOSE Supplies Needed  Blood glucose meter.  Test strips for your meter. Each meter has its own strips. You must use the strips that go with your own meter.  A pricking needle (lancet).  A device that holds the lancet (lancing device).  A journal or log book to write down your results. Procedure  Wash your hands with soap and water. Alcohol is not preferred.  Prick the side of your finger (not the tip) with the lancet.  Gently milk the finger until a small drop of blood appears.  Follow the instructions that come with your meter for inserting the test strip, applying blood to the strip, and using your blood glucose meter. Other Areas to Get Blood for Testing Some meters allow you to use other areas of your body (other than your finger) to test your blood. These areas are called alternative sites. The most common alternative sites are:  The forearm.  The thigh.  The back area of the lower leg.  The palm of the hand. The blood flow in these areas is slower. Therefore, the blood glucose values you get may be delayed, and the numbers are different from what you would get from your fingers. Do not use alternative sites if you think you are having hypoglycemia. Your reading will not be accurate. Always use a finger if you are having hypoglycemia. Also, if you cannot feel your lows (hypoglycemia unawareness), always use your fingers for your blood glucose checks. ADDITIONAL TIPS FOR GLUCOSE MONITORING  Do not reuse  lancets.  Always carry your supplies with you.  All blood glucose meters have a 24-hour "hotline" number to call if you have questions or need help.  Adjust (calibrate) your blood glucose meter with a control solution after finishing a few boxes of strips. BLOOD GLUCOSE RECORD KEEPING It is a good idea to keep a daily record or log of your blood glucose readings. Most glucose meters, if not all, keep your glucose records stored in the meter. Some meters come with the ability to download your records to your home computer. Keeping a record of your blood glucose readings is especially helpful if you are wanting to look for patterns. Make notes to go along with the blood glucose readings because you might forget what happened at that exact time. Keeping good records helps you and your health care provider to work together to achieve good diabetes management.    This information is not intended to replace advice given to you by your health care provider. Make sure you discuss any questions you have with your health care provider.   Document Released: 08/17/2003 Document Revised: 09/04/2014 Document Reviewed: 01/06/2013 Elsevier Interactive Patient Education 2016 [place].     Hypoglycemia Low blood sugar (hypoglycemia) means that the level of sugar in your blood is lower than it should be. Signs of low blood sugar include:  Getting sweaty.  Feeling hungry.  Feeling dizzy or weak.  Feeling sleepier than normal.  Feeling nervous.  Headaches.  Having a fast heartbeat. Low blood sugar can happen fast and can be an emergency. Your doctor can do tests to check your blood sugar level. You can have low blood sugar and not have diabetes. HOME CARE  Check your blood sugar as told by your doctor. If it is less than 70 mg/dl or as told by your doctor, take 1 of the following:  3 to 4 glucose tablets.   cup clear juice.   cup soda pop, not diet.  1 cup milk.  5 to 6 hard  candies.  Recheck blood sugar after 15 minutes. Repeat until it is at the right level.  Eat a snack if it is more than 1 hour until the next meal.  Only take medicine as told by your doctor.  Do not skip meals. Eat on time.  Do not drink alcohol except with meals.  Check your blood glucose before driving.  Check your blood glucose before and after exercise.  Always carry treatment with you, such as glucose pills.  Always wear a medical alert bracelet if you have diabetes. GET HELP RIGHT AWAY IF:   Your blood glucose goes below 70 mg/dl or as told by your doctor, and you:  Are confused.  Are not able to swallow.  Pass out (faint).  You cannot treat yourself. You may need someone to help you.  You have low blood sugar problems often.  You have  problems from your medicines.  You are not feeling better after 3 to 4 days.  You have vision changes. MAKE SURE YOU:   Understand these instructions.  Will watch this condition.  Will get help right away if you are not doing well or get worse.   This information is not intended to replace advice given to you by your health care provider. Make sure you discuss any questions you have with your health care provider.   Document Released: 11/08/2009 Document Revised: 09/04/2014 Document Reviewed: 04/20/2015 Elsevier Interactive Patient Education [employer].

## 2016-04-19 ENCOUNTER — Encounter (INDEPENDENT_AMBULATORY_CARE_PROVIDER_SITE_OTHER): Payer: Medicare Other | Admitting: Ophthalmology

## 2016-04-21 ENCOUNTER — Other Ambulatory Visit: Payer: Self-pay

## 2016-04-21 DIAGNOSIS — E1142 Type 2 diabetes mellitus with diabetic polyneuropathy: Secondary | ICD-10-CM

## 2016-04-21 MED ORDER — METFORMIN HCL ER 500 MG PO TB24: ORAL_TABLET | ORAL | 0 refills | Status: DC

## 2016-04-21 NOTE — Telephone Encounter (Signed)
After review of chart with Dr. Raliegh Scarlet, RX from 04/05/2016 was marked "no print" and not sent to pharmacy.  Pt states that Lantus is working well and she states that it has brought her blood sugars down.  Reminded pt to be sure and keep her follow up appt at the end of September.  Pt expressed understanding and is agreeable.  Charyl Bigger, CMA

## 2016-05-04 ENCOUNTER — Encounter (INDEPENDENT_AMBULATORY_CARE_PROVIDER_SITE_OTHER): Payer: Medicare Other | Admitting: Ophthalmology

## 2016-05-08 ENCOUNTER — Telehealth: Payer: Self-pay

## 2016-05-08 NOTE — Telephone Encounter (Signed)
Received fax from [facility] requesting refill on diclofenac 75mg , previously prescribed by Vicenta Aly, NP.  Advised pt that since we have not prescribed this medication previously, she will need OV to discuss with Dr. Raliegh Scarlet.  Pt expressed understanding and is agreeable.  Pt transferred to front desk to move up her appointment with Dr. Raliegh Scarlet.  Charyl Bigger, CMA

## 2016-05-10 ENCOUNTER — Ambulatory Visit (INDEPENDENT_AMBULATORY_CARE_PROVIDER_SITE_OTHER): Payer: Medicare Other | Admitting: Family Medicine

## 2016-05-10 ENCOUNTER — Encounter: Payer: Self-pay | Admitting: Family Medicine

## 2016-05-10 VITALS — HR 87 | Ht 64.5 in | Wt 184.2 lb

## 2016-05-10 DIAGNOSIS — M545 Low back pain, unspecified: Secondary | ICD-10-CM

## 2016-05-10 DIAGNOSIS — I639 Cerebral infarction, unspecified: Secondary | ICD-10-CM

## 2016-05-10 DIAGNOSIS — G4733 Obstructive sleep apnea (adult) (pediatric): Secondary | ICD-10-CM

## 2016-05-10 DIAGNOSIS — I10 Essential (primary) hypertension: Secondary | ICD-10-CM

## 2016-05-10 DIAGNOSIS — E669 Obesity, unspecified: Secondary | ICD-10-CM | POA: Diagnosis not present

## 2016-05-10 DIAGNOSIS — E782 Mixed hyperlipidemia: Secondary | ICD-10-CM | POA: Diagnosis not present

## 2016-05-10 DIAGNOSIS — N183 Chronic kidney disease, stage 3 unspecified: Secondary | ICD-10-CM

## 2016-05-10 DIAGNOSIS — M549 Dorsalgia, unspecified: Secondary | ICD-10-CM

## 2016-05-10 DIAGNOSIS — B182 Chronic viral hepatitis C: Secondary | ICD-10-CM

## 2016-05-10 DIAGNOSIS — R413 Other amnesia: Secondary | ICD-10-CM

## 2016-05-10 DIAGNOSIS — R197 Diarrhea, unspecified: Secondary | ICD-10-CM

## 2016-05-10 DIAGNOSIS — E785 Hyperlipidemia, unspecified: Secondary | ICD-10-CM

## 2016-05-10 DIAGNOSIS — E1142 Type 2 diabetes mellitus with diabetic polyneuropathy: Secondary | ICD-10-CM

## 2016-05-10 DIAGNOSIS — M159 Polyosteoarthritis, unspecified: Secondary | ICD-10-CM

## 2016-05-10 DIAGNOSIS — G8929 Other chronic pain: Secondary | ICD-10-CM

## 2016-05-10 DIAGNOSIS — M1990 Unspecified osteoarthritis, unspecified site: Secondary | ICD-10-CM

## 2016-05-10 DIAGNOSIS — E0800 Diabetes mellitus due to underlying condition with hyperosmolarity without nonketotic hyperglycemic-hyperosmolar coma (NKHHC): Secondary | ICD-10-CM | POA: Diagnosis not present

## 2016-05-10 DIAGNOSIS — R42 Dizziness and giddiness: Secondary | ICD-10-CM

## 2016-05-10 DIAGNOSIS — Z794 Long term (current) use of insulin: Secondary | ICD-10-CM

## 2016-05-10 DIAGNOSIS — E089 Diabetes mellitus due to underlying condition without complications: Secondary | ICD-10-CM | POA: Insufficient documentation

## 2016-05-10 LAB — POCT GLYCOSYLATED HEMOGLOBIN (HGB A1C): HEMOGLOBIN A1C: 7.1

## 2016-05-10 MED ORDER — DICLOFENAC SODIUM 75 MG PO TBEC: 75.0000 mg | DELAYED_RELEASE_TABLET | Freq: Two times a day (BID) | ORAL | 0 refills | Status: DC

## 2016-05-10 MED ORDER — LIRAGLUTIDE 18 MG/3ML ~~LOC~~ SOPN: 0.6000 mg | PEN_INJECTOR | Freq: Every morning | SUBCUTANEOUS | 3 refills | Status: DC

## 2016-05-10 MED ORDER — BD LANCET ULTRAFINE 33G MISC: 11 refills | Status: DC

## 2016-05-10 MED ORDER — ATORVASTATIN CALCIUM 10 MG PO TABS: 10.0000 mg | ORAL_TABLET | Freq: Every day | ORAL | 0 refills | Status: DC

## 2016-05-10 MED ORDER — VITAMIN D (ERGOCALCIFEROL) 1.25 MG (50000 UNIT) PO CAPS: 50000.0000 [IU] | ORAL_CAPSULE | ORAL | 20 refills | Status: AC

## 2016-05-10 NOTE — Patient Instructions (Addendum)
Start victoza and only give yourself 0.6 mg daily if after one week after starting Victoza, blood sugars are still not at goal of 130-140s in the morning, then go up to the 1.2 mg per day dose.   Keep track of BS's and write them down.   Call ortho for appt to discuss your various jt pains and furhter pain mgt by them    --> Diclofenac sodium enteric-coated tablets What is this medicine? DICLOFENAC (dye KLOE fen ak) is a non-steroidal anti-inflammatory drug (NSAID). It is used to reduce swelling and to treat pain. It may be used to treat osteoarthritis, rheumatoid arthritis, and ankylosing spondylitis. This medicine may be used for other purposes; ask your health care provider or pharmacist if you have questions. What should I tell my health care provider before I take this medicine? They need to know if you have any of these conditions: -asthma, especially aspirin sensitive asthma -coronary artery bypass graft (CABG) surgery within the past 2 weeks -drink more than 3 alcohol containing drinks a day -heart disease or circulation problems like heart failure or leg edema (fluid retention) -high blood pressure -kidney disease -liver disease -stomach bleeding or ulcers -an unusual or allergic reaction to diclofenac, aspirin, other NSAIDs, other medicines, foods, dyes, or preservatives -pregnant or trying to get pregnant -breast-feeding How should I use this medicine? Take this medicine by mouth with food and with a full glass of water. Do not crush or chew the medicine. Follow the directions on the prescription label. Take your medicine at regular intervals. Do not take your medicine more often than directed. Long-term, continuous use may increase the risk of heart attack or stroke. A special MedGuide will be given to you by the pharmacist with each prescription and refill. Be sure to read this information carefully each time. Talk to your pediatrician regarding the use of this medicine in  children. Special care may be needed. Elderly patients over 40 years old may have a stronger reaction and need a smaller dose. Overdosage: If you think you have taken too much of this medicine contact a poison control center or emergency room at once. NOTE: This medicine is only for you. Do not share this medicine with others. What if I miss a dose? If you miss a dose, take it as soon as you can. If it is almost time for your next dose, take only that dose. Do not take double or extra doses. What may interact with this medicine? Do not take this medicine with any of the following medications: -cidofovir -ketorolac -methotrexate This medicine may also interact with the following medications: -alcohol -aspirin and aspirin like medicines -cyclosporine -diuretics -lithium -medicines for blood pressure -medicines for osteoporosis -medicines that affect platelets -medicines that treat or prevent blood clots like warfarin -NSAIDs, medicines for pain and inflammation, like ibuprofen or naproxen -pemetrexed -steroid medicines like prednisone or cortisone This list may not describe all possible interactions. Give your health care provider a list of all the medicines, herbs, non-prescription drugs, or dietary supplements you use. Also tell them if you smoke, drink alcohol, or use illegal drugs. Some items may interact with your medicine. What should I watch for while using this medicine? Tell your doctor or health care professional if your pain does not get better. Talk to your doctor before taking another medicine for pain. Do not treat yourself. This medicine does not prevent heart attack or stroke. In fact, this medicine may increase the chance of a heart attack or  stroke. The chance may increase with longer use of this medicine and in people who have heart disease. If you take aspirin to prevent heart attack or stroke, talk with your doctor or health care professional. Do not take medicines such  as ibuprofen and naproxen with this medicine. Side effects such as stomach upset, nausea, or ulcers may be more likely to occur. Many medicines available without a prescription should not be taken with this medicine. This medicine can cause ulcers and bleeding in the stomach and intestines at any time during treatment. Do not smoke cigarettes or drink alcohol. These increase irritation to your stomach and can make it more susceptible to damage from this medicine. Ulcers and bleeding can happen without warning symptoms and can cause death. You may get drowsy or dizzy. Do not drive, use machinery, or do anything that needs mental alertness until you know how this medicine affects you. Do not stand or sit up quickly, especially if you are an older patient. This reduces the risk of dizzy or fainting spells. This medicine can cause you to bleed more easily. Try to avoid damage to your teeth and gums when you brush or floss your teeth. What side effects may I notice from receiving this medicine? Side effects that you should report to your doctor or health care professional as soon as possible: -allergic reactions like skin rash, itching or hives, swelling of the face, lips, or tongue -black or bloody stools, blood in the urine or vomit -blurred vision -chest pain -difficulty breathing or wheezing -nausea or vomiting -slurred speech or weakness on one side of the body -unexplained weight gain or swelling -unusually weak or tired -yellowing of eyes or skin Side effects that usually do not require medical attention (report to your doctor or health care professional if they continue or are bothersome): -constipation -diarrhea -dizziness -headache -heartburn This list may not describe all possible side effects. Call your doctor for medical advice about side effects. You may report side effects to FDA at 1-800-FDA-1088. Where should I keep my medicine? Keep out of the reach of children. Store at room  temperature below 30 degrees C (86 degrees F). Protect from moisture. Keep container tightly closed. Throw away any unused medicine after the expiration date. NOTE: This sheet is a summary. It may not cover all possible information. If you have questions about this medicine, talk to your doctor, pharmacist, or health care provider.    2016, [employer]. (2009-01-01 14:42:31)

## 2016-05-10 NOTE — Assessment & Plan Note (Signed)
Did follow-up with Dr. Lamonte Sakai- Pulm-  in February 22 2016

## 2016-05-10 NOTE — Assessment & Plan Note (Signed)
Patient endorses a history of degenerative disc disease and apparently has seen a orthopedic back/spine specialist in the past. She does not recall the physician's name. Desires RF diclofenac

## 2016-05-10 NOTE — Assessment & Plan Note (Signed)
Patient states she is out of her statin and I will refill for 90 days. Patient has follow-up with her cardiologist in October 2017. Further treatment per their recommendations.

## 2016-05-10 NOTE — Assessment & Plan Note (Addendum)
Endorses multi degenerative joint disease.   Sees [facility] for her many aches and pains in the past.  - Told patient to refill 1 month of diclofenac and in the future she will get pain meds from the treating physicians for these conditions.

## 2016-05-10 NOTE — Assessment & Plan Note (Deleted)
Has seen neurology.   

## 2016-05-10 NOTE — Assessment & Plan Note (Signed)
Also recently sent to vestibular rehabilitation by Dr. Harrington Challenger, her cardiologist this past spring-May 2017.   Patient states it is helped but she has not continued do the exercises at home.  Strongly encouraged to do so.

## 2016-05-10 NOTE — Assessment & Plan Note (Addendum)
Is up a couple pounds from prior office visits.  Encouraged weight loss.  Explained to patient what BMI refers to, and what it means medically.    Told patient to think about it as a "medical risk stratification measurement" and how increasing BMI is associated with increasing risk/ or worsening state of various diseases such as hypertension, hyperlipidemia, diabetes, premature OA, depression etc.  American Heart Association guidelines for healthy diet, basically Mediterranean diet, and exercise guidelines of 30 minutes 5 days per week or more discussed in detail.

## 2016-05-10 NOTE — Assessment & Plan Note (Addendum)
Recently checked on 02/23/2016-1.1 which is the best it's been a while.    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    Needs to drink more water!  (  1/2 wt in oz water per day strongly encouraged  )

## 2016-05-10 NOTE — Progress Notes (Signed)
Assessment and Plan:   1. Type 2 diabetes mellitus with peripheral neuropathy (HCC)   2. Diabetes mellitus due to underlying condition with hyperosmolarity without coma, with long-term current use of insulin ([place])   3. Mixed hyperlipidemia   4. Obesity   5.  chronic DIZZINESS   6. HLD (hyperlipidemia)   7. Bad memory   8. Obstructive apnea   9. Arthritis   10. Chronic lower back pain   11. Back pain, chronic   12. Diarrhea, unspecified type   13. Chronic kidney disease, stage 3 (moderate)   14. Generalized OA      chronic DIZZINESS Also recently sent to vestibular rehabilitation by Dr. Harrington Challenger, her cardiologist this past spring-May 2017.   Patient states it has helped but she has not continued do the exercises at home.  Strongly encouraged to do so.   Obesity Is up a couple pounds from prior office visits. Encouraged weight loss.   Obstructive apnea Did follow-up with Dr. Lamonte Sakai- Pulm-  in February 22 2016   Chronic kidney disease Recently checked on 02/23/2016-1.1 which is the best it's been a while.    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    Needs to drink more water   Chronic lower back pain Patient endorses a history of degenerative disc disease and apparently has seen a orthopedic back/spine specialist in the past. She does not recall the physician's name.  Desires RF diclofenac  Cont Neurontin and cymbalta   Generalized OA Endorses multi degenerative joint disease. Has seen [place] for her many aches and pains in the past.  - Told patient to refill 1 month of diclofenac and in the future she will get pain meds from the treating physicians for these conditions.   - Hypertension:  Continue current treatment regimen for now Mgt per Cards- she will see them in Oct Importance of ambulatory blood pressure monitoring d/c pt  DASH diet.   Routine exercise   - HLD  / Cholesterol:  Patient states she is out of her statin and I will refill for 90 days.    Patient has follow-up with her cardiologist in October 2017. Further treatment per their recommendations.  Continue current treatment regimen for now--> Mgt per Cards  Prudent diet and exercise.   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    Next OV consider d/c glimepiride and start a SGLT-2i : Invokana   ( +/- insulin w meals if A1c above 8.0 )  The fact that your A1c is better this time is likely from [place] increasing the metformin a couple of months ago (which she could not tolerate.)   Prudent diet and exercise.   Check A1C q 3mo  eye & foot exams and urine micro albumin yearly- she will call for eye exam!  (UTD UA and Foot)   - OA and DDD, DJD:  RF of diclofenac.    Further txmnt per ortho - pt will get pain meds by txing physician in future   Pt will f/up with her ortho doc--- [facility] for her various joint pains.    - Vitamin D:   check level and continue medications.    - Weight Mgt: Explained to patient what BMI refers to, and what it means medically.    Told patient  to think about it as a "medical risk stratification measurement" and how increasing BMI is associated with increasing risk/ or worsening state of various diseases such as hypertension, hyperlipidemia, diabetes, premature OA, depression etc.  American Heart Association guidelines for healthy diet, basically Mediterranean diet, and exercise guidelines of 30 minutes 5 days per week or more discussed in detail.    -Reminded patient the need for yearly complete physical exam office visits in addition to office visits for management of the chronic diseases  The patient was counseled, risk factors were discussed, anticipatory guidance given.  -Gross side effects, risk and benefits, and alternatives of medications discussed with  patient.  Patient is aware that all medications have potential side effects and we are unable to predict every side effect or drug-drug interaction that may occur.  Expresses verbal understanding and consents to current therapy plan and treatment regimen.  Orders Placed This Encounter  Procedures  . POCT glycosylated hemoglobin (Hb A1C)    Patient's Medications  New Prescriptions   LIRAGLUTIDE 18 MG/3ML SOPN    Inject 0.1 mLs (0.6 mg total) into the skin every morning. Check FBS daily and write it down   VITAMIN D, ERGOCALCIFEROL, (DRISDOL) 50000 UNITS CAPS CAPSULE    Take 1 capsule (50,000 Units total) by mouth every 7 (seven) days.  Previous Medications   ARIPIPRAZOLE (ABILIFY) 5 MG TABLET    Take 5 mg by mouth at bedtime.   ASPIRIN 325 MG TABLET    Take 1 tablet (325 mg total) by mouth daily.   AZELASTINE (ASTELIN) 0.1 % NASAL SPRAY    2 sprays each nostril four times daily for 3-5 days then twice a day   BISOPROLOL (ZEBETA) 5 MG TABLET    TAKE ONE TABLET BY MOUTH ONCE DAILY   COLESTIPOL (COLESTID) 1 G TABLET    Take 1 g by mouth 2 (two) times daily.   CYCLOSPORINE (RESTASIS) 0.05 % OPHTHALMIC EMULSION    Place 1 drop into both eyes 2 (two) times daily.   DULOXETINE (CYMBALTA) 30 MG CAPSULE    Take 30 mg by mouth daily.   GABAPENTIN (NEURONTIN) 300 MG CAPSULE    2 capsules at breakfast, one capsule at lunch and 2 capsules after dinner   GEMFIBROZIL (LOPID) 600 MG TABLET    Take 600 mg by mouth 2 (two) times daily before a meal.   GLIMEPIRIDE (AMARYL) 4 MG TABLET    Take 8 mg by mouth daily with breakfast.    HYDROXYZINE (ATARAX/VISTARIL) 25 MG TABLET    Take 25 mg by mouth. One capsule every morning as needed for anxiety   INSULIN GLARGINE (LANTUS SOLOSTAR) 100 UNIT/ML SOLOSTAR PEN    Inject 20 Units into the skin daily at 10 pm.   LISINOPRIL (PRINIVIL,ZESTRIL) 40 MG TABLET    Take 1 tablet by mouth daily.   ONDANSETRON (ZOFRAN) 8 MG TABLET    Take 1 tablet (8 mg total) by mouth every 8  (eight) hours as needed for nausea or vomiting.   OXYGEN-HELIUM IN    2-3 L by Other route continuous.    PANTOPRAZOLE (PROTONIX) 40 MG TABLET    TAKE ONE TABLET BY MOUTH ONCE DAILY   SITAGLIPTIN (JANUVIA) 100 MG TABLET    Take 1 tablet by mouth daily.   SOLIFENACIN (VESICARE) 5 MG TABLET    Take 5 mg by mouth daily.    TRAZODONE (DESYREL) 50 MG TABLET    Take 50 mg by mouth at bedtime as needed for  sleep.   Modified Medications   Modified Medication Previous Medication   ATORVASTATIN (LIPITOR) 10 MG TABLET atorvastatin (LIPITOR) 10 MG tablet      Take 1 tablet (10 mg total) by mouth daily.    Take 1 tablet (10 mg total) by mouth daily.   B-D ULTRA-FINE 33 LANCETS MISC B-D ULTRA-FINE 33 LANCETS MISC      Check blood sugars twice daily, fasting and 2 hours after largest meal of day.    TEST BLOOD GLUCOSE 2 x daily   DICLOFENAC (VOLTAREN) 75 MG EC TABLET diclofenac (VOLTAREN) 75 MG EC tablet      Take 1 tablet (75 mg total) by mouth 2 (two) times daily.    Take 1 tablet by mouth 2 (two) times daily.  Discontinued Medications   DULOXETINE (CYMBALTA) 30 MG CAPSULE    Take 30 mg by mouth 2 (two) times daily.    GABAPENTIN (NEURONTIN) 300 MG CAPSULE    Take 1 capsule (300 mg total) by mouth 2 (two) times daily.   LISINOPRIL (PRINIVIL,ZESTRIL) 20 MG TABLET    Take 20 mg by mouth daily.   METFORMIN (GLUCOPHAGE XR) 500 MG 24 HR TABLET    Use 1/2 tab q am     Return in about 6 weeks (around 06/21/2016), or f/up for Blood Sugar.  Please see AVS handed out to patient at the end of our visit for further patient instructions/ counseling done pertaining to today's office visit.    Note: This document was prepared using Dragon voice recognition software and may include unintentional dictation errors.  Ahlia Lemanski 1:04 PM ----------------------------------------------------------------------------------------------------------------------   Subjective:  HPI: Madelynn Done [id number] y.o. female   presents for 1) RF of med (diclofenac)  2) f/up med changes for DM 3) BP reck  1)  - Here b/c of  Needing RF of her Diclofenac and my med assist told pt that she needed to be seen since we never prescribed it prior.    Today patient endorses a history of osteoarthritis, degenerative joint disease and degenerative disc disease with chronic low back pain.  Apparently she used to see a pain physician for this in the past but says all he wanted to do was give her "injections and strong pain meds" which she did not want.   Has taken diclofenac when necessary pain for some time now. Tolerating well.    2)  Also last OV on 8\9\17 we decreased metformin and started Lantus- -   had her back down on her metformin XL to one half tablet a day. In hopes for her to have gotten rid of the diarrhea.  Which she has not. Still having accidents in her pants. Pt is currently maintained on the following medications for diabetes:  lantus 20U q hs, Metformin XL- 250 daily, januvia, glimepiride - Also started patient on Lantus 20 units every evening. She is tolerating this well. Blood sugars in the mornings have been running 160s on average with 264 as the highest and 117 as her lowest.  She denies hyper or hypoglycemic symptoms.    - Still not drinking much water - Still no exercise which I encouraged her to do - Trying to eat better and is cooking more of her own foods, although it's difficult because of her living situation.   Pt will be calling today for DM eye e appt--> they canacelled/ rescheduled her recently.  Last time was 2 yrs ago or so.   Foot exam- UTD  Last A1C  in the office was:   TODAY WAS 7.1 Lab Results  Component Value Date   HGBA1C 7.1 05/10/2016   HGBA1C 7.8 02/23/2016   HGBA1C 8.5 (H) 07/01/2015   Lab Results  Component Value Date   LDLCALC 41 12/13/2015   CREATININE 1.1 02/23/2016     3)  HTN: -  Her BP at home:   unknw- doesn't check Patient is currently maintained on the  following medications for blood pressure: bisoprolol, lisinopril - sees cardiology for this  Patient reports good compliance with blood pressure medications Denies medication S-E - But Has her chronic dizziness- Sent to vestibular rehab by Cards in April '17   Patient reports very little compliance with prudent diet.  Exercise- none -- Smoking Status noted  - She denies new onset of: chest pain, exercise intolerance, shortness of breath, new sx of dizziness, No visual changes, headache, lower extremity swelling or claudication.     Last 3 blood pressure readings in our office are as follows: BP Readings from Last 3 Encounters:  04/05/16 (!) 145/82  03/08/16 (!) 153/84  02/22/16 122/86     Weight:  Wt Readings from Last 3 Encounters:  05/10/16 184 lb 3.2 oz (83.6 kg)  04/05/16 182 lb (82.6 kg)  03/08/16 182 lb 8 oz (82.8 kg)   BMI Readings from Last 3 Encounters:  05/10/16 31.13 kg/m  04/05/16 30.76 kg/m  03/08/16 30.84 kg/m      Review of Systems  Constitutional: Positive for malaise/fatigue. Negative for diaphoresis and weight loss.       Chronically feels fatigued/ weak- pt is physically deconditioned  Eyes: Negative for blurred vision and double vision.  Respiratory: Negative for cough, shortness of breath and wheezing.   Cardiovascular: Negative for chest pain, palpitations, orthopnea, claudication, leg swelling and PND.  Gastrointestinal: Positive for diarrhea and heartburn. Negative for abdominal pain, blood in stool, constipation and nausea.       Occ- GERD;  Chronic diarrhea  Genitourinary: Negative for hematuria.  Musculoskeletal: Positive for back pain and myalgias.       Chronic  Skin: Negative for itching and rash.  Neurological: Positive for dizziness and weakness. Negative for focal weakness, loss of consciousness and headaches.  Endo/Heme/Allergies: Positive for environmental allergies.  Psychiatric/Behavioral: Positive for depression. The patient is  nervous/anxious and has insomnia.        Occasionally- gets depressed mood and/or nervous. Diff with sleep- well controlled meds     Objective:  Physical Exam: Pulse 87   Ht 5' 4.5" (1.638 m)   Wt 184 lb 3.2 oz (83.6 kg)   BMI 31.13 kg/m  General: Well nourished, in no apparent distress. Eyes: PERRLA, EOMs, conjunctiva no swelling or erythema ENT/Mouth: Hearing appears normal.  Mucus Membranes Moist  Neck: Supple, no masses Resp: Respiratory effort- normal, ECTA B/L w/o W/R/R  Cardio: RRR w/o MRGs. Abdomen: Soft, + BS.  Non tender, no guarding, rebound, hernias, masses. Lymphatics:  Brisk peripheral pulses, less 2 sec cap RF, no gross edema  M-sk: Full ROM, 5/5 strength, normal gait.  Skin: Warm, dry without rashes, lesions, ecchymosis.  Neuro: Alert, Oriented Psych: Normal affect, Insight and Judgment appropriate.     Current Medications:  Current Outpatient Prescriptions on File Prior to Visit  Medication Sig Dispense Refill  . aspirin 325 MG tablet Take 1 tablet (325 mg total) by mouth daily.    [name] azelastine (ASTELIN) 0.1 % nasal spray 2 sprays each nostril four times daily for 3-5 days then twice  a day    . bisoprolol (ZEBETA) 5 MG tablet TAKE ONE TABLET BY MOUTH ONCE DAILY 30 tablet 6  . cycloSPORINE (RESTASIS) 0.05 % ophthalmic emulsion Place 1 drop into both eyes 2 (two) times daily.    [name] gemfibrozil (LOPID) 600 MG tablet Take 600 mg by mouth 2 (two) times daily before a meal.    . glimepiride (AMARYL) 4 MG tablet Take 8 mg by mouth daily with breakfast.     . Insulin Glargine (LANTUS SOLOSTAR) 100 UNIT/ML Solostar Pen Inject 20 Units into the skin daily at 10 pm. 5 pen PRN  . ondansetron (ZOFRAN) 8 MG tablet Take 1 tablet (8 mg total) by mouth every 8 (eight) hours as needed for nausea or vomiting. 10 tablet 0  . OXYGEN-HELIUM IN 2-3 L by Other route continuous.     . pantoprazole (PROTONIX) 40 MG tablet TAKE ONE TABLET BY MOUTH ONCE DAILY 30 tablet 5  . sitaGLIPtin  (JANUVIA) 100 MG tablet Take 1 tablet by mouth daily.    . solifenacin (VESICARE) 5 MG tablet Take 5 mg by mouth daily.     . traZODone (DESYREL) 50 MG tablet Take 50 mg by mouth at bedtime as needed for sleep.      No current facility-administered medications on file prior to visit.     Medical History:  Patient Active Problem List   Diagnosis Date Noted  . Diabetes mellitus due to underlying condition, controlled, with long-term current use of insulin ([place]) 05/10/2016    Priority: High  . Obesity 03/18/2016    Priority: High  . Chronic kidney disease 03/08/2016    Priority: High  . Chronic Diarrhea 07/27/2008    Priority: High  . CAD (coronary artery disease) 03/18/2016    Priority: Medium  . Bad memory 07/10/2015    Priority: Medium  . Mixed incontinence 07/10/2015    Priority: Medium  . Chronic hypoxic respiratory failure:  home O2 (Drs [place] Carbo and Young) 08/04/2014    Priority: Medium  . Personal history of transient ischemic attack (TIA), and cerebral infarction without residual deficits 11/20/2013    Priority: Medium  . h/o Acute pulmonary embolism ([place]) 06/21/2012    Priority: Medium  . Depression, major, in remission ([place]) 06/27/2011    Priority: Medium  . Essential hypertension 06/27/2011    Priority: Medium  . Mixed hyperlipidemia 11/18/2009    Priority: Medium  . CAROTID ARTERY STENOSIS, WITHOUT INFARCTION 11/08/2009    Priority: Medium  .  chronic DIZZINESS 11/18/2008    Priority: Medium  . Esophageal reflux 10/02/2007    Priority: Medium  . h/o freq CANDIDIASIS, ORAL 08/25/2008    Priority: Low  . Chronic lower back pain 05/10/2016  . Generalized OA 05/10/2016  . COPD (chronic obstructive pulmonary disease) ([place]) 03/18/2016  . Chronic headaches ( migraineous)  03/18/2016  . Arthritis 03/18/2016  . Aortic stenosis 03/18/2016  . History of genital warts 03/18/2016  . Chronic dryness of both eyes 03/18/2016  . Int Hemorrhoids: chronic blood in stool   .  New onset of headaches after age 46 09/15/2015  . Snoring 09/15/2015  . Daytime hypersomnolence- OSA 09/15/2015  . 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 Frailty 07/10/2015  . Altered mental state 07/01/2015  . Allergic rhinitis 01/22/2014  . h/o Anemia 11/19/2013  . Problem situation relating to social and personal history 09/18/2013  . Polypharmacy 07/02/2012  . Other long term (current) drug therapy 07/02/2012  . CHF (congestive heart failure) ([place]) 06/13/2012  .  Fatty liver disease, nonalcoholic [id number]  . Obstructive apnea 09/08/2011  . Back pain, chronic 06/27/2011  . Absence of bladder continence 06/27/2011  . Malaise and fatigue: Frailty, chronic deconditioning 11/08/2010  . HYPERURICEMIA 07/29/2009  . Polyuria 07/27/2008  . Chronic hepatitis C virus infection ([place]) 10/02/2007  . Gastroparesis 10/02/2007  . Dysphagia 10/02/2007    Allergies:  Allergies  Allergen Reactions  . Sunflower Oil Anaphylaxis  . Iodine Nausea And Vomiting    Fish causes n/v  . Metoclopramide Hcl Other (See Comments)    Nerves tense up, panic attack  . Sulfamethoxazole Nausea And Vomiting  . Shellfish Allergy Nausea And Vomiting  . Sulfamethoxazole-Trimethoprim Nausea And Vomiting     Family history-  Reviewed; changed as appropriate  Social history-  Reviewed; changed as appropriate

## 2016-05-12 ENCOUNTER — Other Ambulatory Visit: Payer: Self-pay

## 2016-05-12 DIAGNOSIS — Z794 Long term (current) use of insulin: Principal | ICD-10-CM

## 2016-05-12 DIAGNOSIS — E0800 Diabetes mellitus due to underlying condition with hyperosmolarity without nonketotic hyperglycemic-hyperosmolar coma (NKHHC): Secondary | ICD-10-CM

## 2016-05-12 MED ORDER — INSULIN PEN NEEDLE 31G X 5 MM MISC: 1.0000 [IU] | Freq: Every day | 0 refills | Status: DC

## 2016-05-12 NOTE — Assessment & Plan Note (Addendum)
Continue current treatment regimen for now- Mgt per Cards- she will see them in Oct 2017  Importance of ambulatory blood pressure monitoring d/c pt  DASH diet.   Routine exercise

## 2016-05-12 NOTE — Progress Notes (Signed)
Pt states she needs RX for pen needles for use with Lantus Solostar.  Charyl Bigger, CMA

## 2016-05-12 NOTE — Assessment & Plan Note (Signed)
Stop metformin due to diarrhea add GLP-1RA Victoza cont lantus, DPP-4i :Januvia and glimepiride.    Next OV consider d/c glimepiride and start a SGLT-2i : Invokana   ( +/- insulin w meals if A1c above 8.0 )  The fact that your A1c is better this time is likely from [place] increasing the metformin a couple of months ago (which she could not tolerate.)   Prudent diet and exercise.   Check A1C q 46mo  eye & foot exams and urine micro albumin yearly- she will call for eye exam!  (UTD UA and Foot)

## 2016-05-12 NOTE — Assessment & Plan Note (Signed)
Patient has seen [facility] neurology for this  Patient endorses a history of degenerative disc disease and apparently has seen a orthopedic back/spine specialist in the past. She does not recall the physician's name.  Desires RF diclofenac  Also on Neurontin and Cymbalta

## 2016-05-12 NOTE — Assessment & Plan Note (Addendum)
Patient states she is out of her statin and I will refill for 90 days.   Patient has follow-up with her cardiologist in October 2017. Further treatment per their recommendations.  Continue current treatment regimen for now--> Mgt per Cards  Prudent diet and exercise.   Check cholesterol +/- CMET q 32mo - Has appt with Cards 06/12/16

## 2016-05-25 ENCOUNTER — Ambulatory Visit: Payer: Medicare Other | Admitting: Family Medicine

## 2016-05-26 ENCOUNTER — Encounter: Payer: Self-pay | Admitting: Internal Medicine

## 2016-06-04 ENCOUNTER — Other Ambulatory Visit: Payer: Self-pay | Admitting: Gastroenterology

## 2016-06-06 NOTE — Telephone Encounter (Signed)
Can I refill this? Please advise. Thank you!

## 2016-06-08 ENCOUNTER — Telehealth: Payer: Self-pay

## 2016-06-08 NOTE — Telephone Encounter (Signed)
Pt called stating that she is having terrible diarrhea with nausea and dizziness.  Denies fever, chills, vomiting or diaphoresis.  Per Dr. Raliegh Scarlet, pt needs to call her GI physician since this a chronic problem for her.  Pt informed.  She expressed understanding and is agreeable.  Charyl Bigger, CMA

## 2016-06-11 ENCOUNTER — Other Ambulatory Visit: Payer: Self-pay | Admitting: Family Medicine

## 2016-06-12 ENCOUNTER — Encounter: Payer: Self-pay | Admitting: Internal Medicine

## 2016-06-12 ENCOUNTER — Ambulatory Visit (INDEPENDENT_AMBULATORY_CARE_PROVIDER_SITE_OTHER): Payer: Medicare Other | Admitting: Internal Medicine

## 2016-06-12 VITALS — BP 162/94 | HR 101 | Ht 64.5 in | Wt 181.1 lb

## 2016-06-12 DIAGNOSIS — I639 Cerebral infarction, unspecified: Secondary | ICD-10-CM | POA: Diagnosis not present

## 2016-06-12 DIAGNOSIS — I10 Essential (primary) hypertension: Secondary | ICD-10-CM | POA: Diagnosis not present

## 2016-06-12 DIAGNOSIS — E785 Hyperlipidemia, unspecified: Secondary | ICD-10-CM | POA: Diagnosis not present

## 2016-06-12 MED ORDER — AMLODIPINE BESYLATE 5 MG PO TABS: 2.5000 mg | ORAL_TABLET | Freq: Every day | ORAL | 3 refills | Status: DC

## 2016-06-12 NOTE — Progress Notes (Signed)
Cardiology Office Note   Date:  0000000   ID:  [name] [name], DOB [id number], MRN [id number]  PCP:  [name], DO  Cardiologist:   Dorris Carnes, MD   F/U of CAD     History of Present Illness: [name] [name] is a 65 y.o. female with a history ofminimal CAD by cath in 2005 (LAD 40%), DM2, HL, GERD, HTN, chronic HCV, carotid stenosis, chest pain and palpitations. Myoview 3/12: EF 70%, no ischemia. Carotid Dopplers 10/10: RICA 40-59%. She has required esophageal dilatation in the past. She is followed at the [facility] for depression. Echo demonstrated normal LVF, mild AS, mild LVH She had knee surgery last year Post procedure developed a PE I saw her in clinic in April 2017   She had dizziness when seen  REferred to vestib rehab  Repositioning maneuvers helped  But she was upset that was not covered by insurane     Since seen notes occasional CP when pushes to go fast   Breathing is OK when uses oxygen     Outpatient Medications Prior to Visit  Medication Sig Dispense Refill  . ARIPiprazole (ABILIFY) 5 MG tablet Take 5 mg by mouth at bedtime.    [name] aspirin 325 MG tablet Take 1 tablet (325 mg total) by mouth daily.    [name] atorvastatin (LIPITOR) 10 MG tablet Take 1 tablet (10 mg total) by mouth daily. 90 tablet 0  . azelastine (ASTELIN) 0.1 % nasal spray 2 sprays each nostril four times daily for 3-5 days then twice a day    . B-D ULTRA-FINE 33 LANCETS MISC Check blood sugars twice daily, fasting and 2 hours after largest meal of day. 1 each 11  . bisoprolol (ZEBETA) 5 MG tablet TAKE ONE TABLET BY MOUTH ONCE DAILY 30 tablet 6  . colestipol (COLESTID) 1 g tablet Take 1 g by mouth 2 (two) times daily.    . cycloSPORINE (RESTASIS) 0.05 % ophthalmic emulsion Place 1 drop into both eyes 2 (two) times daily.    . diclofenac (VOLTAREN) 75 MG EC tablet TAKE ONE TABLET BY MOUTH TWICE DAILY 60 tablet 0  . DULoxetine (CYMBALTA) 30 MG capsule Take 30 mg by mouth daily.    [name]  gabapentin (NEURONTIN) 300 MG capsule 2 capsules at breakfast, one capsule at lunch and 2 capsules after dinner    . gemfibrozil (LOPID) 600 MG tablet Take 600 mg by mouth 2 (two) times daily before a meal.    . glimepiride (AMARYL) 4 MG tablet Take 8 mg by mouth daily with breakfast.     . hydrOXYzine (ATARAX/VISTARIL) 25 MG tablet Take 25 mg by mouth. One capsule every morning as needed for anxiety    . Insulin Glargine (LANTUS SOLOSTAR) 100 UNIT/ML Solostar Pen Inject 20 Units into the skin daily at 10 pm. 5 pen PRN  . Insulin Pen Needle 31G X 5 MM MISC Inject 1 Units into the skin daily at 10 pm. 100 each 0  . Liraglutide 18 MG/3ML SOPN Inject 0.1 mLs (0.6 mg total) into the skin every morning. Check FBS daily and write it down 6 mL 3  . lisinopril (PRINIVIL,ZESTRIL) 40 MG tablet Take 1 tablet by mouth daily.    . ondansetron (ZOFRAN) 8 MG tablet Take 1 tablet (8 mg total) by mouth every 8 (eight) hours as needed for nausea or vomiting. 10 tablet 0  . OXYGEN-HELIUM IN 2-3 L by Other route continuous.     . pantoprazole (PROTONIX)  40 MG tablet TAKE ONE TABLET BY MOUTH ONCE DAILY 30 tablet 5  . solifenacin (VESICARE) 5 MG tablet Take 5 mg by mouth daily.     . traZODone (DESYREL) 50 MG tablet Take 50 mg by mouth at bedtime as needed for sleep.     . Vitamin D, Ergocalciferol, (DRISDOL) 50000 units CAPS capsule Take 1 capsule (50,000 Units total) by mouth every 7 (seven) days. 12 capsule 20   No facility-administered medications prior to visit.      Allergies:   Sunflower oil; Iodine; Metoclopramide hcl; Sulfamethoxazole; Shellfish allergy; Sulfamethoxazole-trimethoprim; and Metformin and related   Past Medical History:  Diagnosis Date  . Altered mental state 07/01/2015  . Anemia   . Anxiety   . Aortic stenosis    Echo 8/13: EF 55-60%, Mild LVH, mild AS, mean gradient 12 mmHg,   . Benign neoplasm of stomach   . CAD (coronary artery disease)    a.  cath 2005: LAD 40%;  b. Myoview 3/12:  EF 70%, no ischemia;  c. Myoview 9/13: EF 76%, no ischemia  . CAROTID ARTERY STENOSIS, WITHOUT INFARCTION 11/08/2009   Overview:  Overview:   Last Assessment & Plan:  She needs follow up carotid dopplers.  I will arrange. Overview:  2005-40 - 59% stenosis of RICA.  May have had f/u study since then but couldn't find results in cardiology notes     . CHF (congestive heart failure) ([place]) 06-13-12   episode after Cancer surgery  . Chronic headaches   . Chronic kidney disease    "my kidneys have been about to shut down" (07/01/2015)  . Chronic lower back pain   . COPD (chronic obstructive pulmonary disease) ([place])   . Depression, major, in remission ([place]) 06/27/2011   Overview:  Also dx'd as PTSD. Followed by [facility]   . Dizziness and giddiness   . Esophageal reflux   . Fibromyalgia   . Gastroparesis   . History of blood transfusion    "when I had my hemorrhoid OR"  . Malaise and fatigue 11/08/2010   Overview:  Last Assessment & Plan:  She describes symptoms c/w sleep apnea.  I will send her for sleep medicine consultation.   . Mixed hyperlipidemia 11/18/2009   Qualifier: Diagnosis of  By: Mack Guise    . Mixed incontinence 07/10/2015  . Obesity 03/18/2016  . Obstructive apnea 09/08/2011   NPSG-02/28/11- AHI 10.8/ hr, desat to 77% on room air, body weight 184 lbs   . Ovarian cancer ([place]) 2000; 2002  . Personal history of transient ischemic attack (TIA), and cerebral infarction without residual deficits 11/20/2013    Past Surgical History:  Procedure Laterality Date  . ABDOMINAL HYSTERECTOMY    . ANTERIOR AND POSTERIOR VAGINAL REPAIR    . APPENDECTOMY    . BACK SURGERY    . BAND HEMORRHOIDECTOMY    . BREAST BIOPSY Left 1972  . CATARACT EXTRACTION W/ INTRAOCULAR LENS  IMPLANT, BILATERAL Bilateral 03/2013  . CHOLECYSTECTOMY OPEN    . COLONOSCOPY N/A 08/11/2013   Procedure: COLONOSCOPY;  Surgeon: Inda Castle, MD;  Location: [facility];   Service: Endoscopy;  Laterality: N/A;  . COLONOSCOPY N/A 12/08/2015   Procedure: COLONOSCOPY;  Surgeon: Manus Gunning, MD;  Location: [facility];  Service: Gastroenterology;  Laterality: N/A;  . DILATION AND CURETTAGE OF UTERUS    . ESOPHAGOGASTRODUODENOSCOPY (EGD) WITH ESOPHAGEAL DILATION     Archie Endo 07/01/2015  . KNEE ARTHROSCOPY Left   .  LUMBAR DISC SURGERY    . OOPHORECTOMY     1 ovary removed, then later last ovary removed  . TONSILLECTOMY  ~ 1958  . TUBAL LIGATION       Social History:  The patient  reports that she has never smoked. She has never used smokeless tobacco. She reports that she does not drink alcohol or use drugs.   Family History:  The patient's family history includes Alcohol abuse in her son; Brain cancer in her father; Breast cancer in her sister; Cancer in her sister and sister; Diabetes in her brother, maternal aunt, and paternal aunt; Esophageal cancer in her brother; Heart disease in her mother; Hyperlipidemia in her son; Hypertension in her mother, sister, and son; Kidney disease in her sister; Stomach cancer in her paternal grandfather.    ROS:  Please see the history of present illness. All other systems are reviewed and  Negative to the above problem except as noted.    PHYSICAL EXAM: VS:  BP (!) 162/94   Pulse (!) 101   Ht 5' 4.5" (1.638 m)   Wt 181 lb 1.9 oz (82.2 kg)   SpO2 98%   BMI 30.61 kg/m   GEN: Well nourished, well developed, in no acute distress  HEENT: normal  Neck: no JVD, carotid bruits, or masses Cardiac: RRR; II/VI systolic murmur LSB  No, rubs, or gallops,no edema  Respiratory:  clear to auscultation bilaterally, normal work of breathing GI: soft, nontender, nondistended, + BS  No hepatomegaly  MS: no deformity Moving all extremities   Skin: warm and dry, no rash Neuro:  Strength and sensation are intact Psych: euthymic mood, full affect   EKG:  EKG is not ordered today.   Lipid Panel    Component Value Date/Time    CHOL 151 12/13/2015 1633   TRIG 306 (H) 12/13/2015 1633   HDL 49 12/13/2015 1633   CHOLHDL 3.1 12/13/2015 1633   VLDL 61 (H) 12/13/2015 1633   LDLCALC 41 12/13/2015 1633   LDLDIRECT 63.6 07/29/2009 0945      Wt Readings from Last 3 Encounters:  06/12/16 181 lb 1.9 oz (82.2 kg)  05/10/16 184 lb 3.2 oz (83.6 kg)  04/05/16 182 lb (82.6 kg)      ASSESSMENT AND PLAN:  1  CAD  I am not convinced of active angina  2  HTN  BP is high  I recomm adding amlodipine 2.5 mg  F/U in Jan  Continue other meds  3  AS  Mild on echo  4  CV dz  Continue meds   5  HL  Keep on lipitor  LDL good    6  Pulm  Followed by R Byrum     Current medicines are reviewed at length with the patient today.  The patient does not have concerns regarding medicines.  Signed, Dorris Carnes, MD  06/12/2016 9:38 AM    [address], [place], [place]  [address] Phone: [phone]; Fax: [phone]

## 2016-06-12 NOTE — Patient Instructions (Signed)
Your physician has recommended you make the following change in your medication: \ 1.) start amlodipine 5 mg tablet -TAKE 1/2 TABLET ONCE A DAY  Your physician recommends that you schedule a follow-up appointment in: January, 2018 with Dr. Harrington Challenger.

## 2016-06-13 LAB — HM DIABETES EYE EXAM

## 2016-06-14 ENCOUNTER — Encounter: Payer: Self-pay | Admitting: Internal Medicine

## 2016-06-20 ENCOUNTER — Other Ambulatory Visit: Payer: Self-pay | Admitting: Family Medicine

## 2016-06-20 DIAGNOSIS — E0800 Diabetes mellitus due to underlying condition with hyperosmolarity without nonketotic hyperglycemic-hyperosmolar coma (NKHHC): Secondary | ICD-10-CM

## 2016-06-20 DIAGNOSIS — Z794 Long term (current) use of insulin: Principal | ICD-10-CM

## 2016-06-21 ENCOUNTER — Ambulatory Visit: Payer: Medicare Other | Admitting: Family Medicine

## 2016-06-28 ENCOUNTER — Encounter: Payer: Self-pay | Admitting: Family Medicine

## 2016-06-28 ENCOUNTER — Ambulatory Visit (INDEPENDENT_AMBULATORY_CARE_PROVIDER_SITE_OTHER): Payer: Medicare Other | Admitting: Family Medicine

## 2016-06-28 VITALS — BP 136/83 | HR 96 | Wt 173.3 lb

## 2016-06-28 DIAGNOSIS — Z794 Long term (current) use of insulin: Secondary | ICD-10-CM

## 2016-06-28 DIAGNOSIS — F325 Major depressive disorder, single episode, in full remission: Secondary | ICD-10-CM

## 2016-06-28 DIAGNOSIS — G4733 Obstructive sleep apnea (adult) (pediatric): Secondary | ICD-10-CM

## 2016-06-28 DIAGNOSIS — I10 Essential (primary) hypertension: Secondary | ICD-10-CM

## 2016-06-28 DIAGNOSIS — Z23 Encounter for immunization: Secondary | ICD-10-CM

## 2016-06-28 DIAGNOSIS — N183 Chronic kidney disease, stage 3 unspecified: Secondary | ICD-10-CM

## 2016-06-28 DIAGNOSIS — E1121 Type 2 diabetes mellitus with diabetic nephropathy: Secondary | ICD-10-CM | POA: Insufficient documentation

## 2016-06-28 DIAGNOSIS — I639 Cerebral infarction, unspecified: Secondary | ICD-10-CM

## 2016-06-28 DIAGNOSIS — E088 Diabetes mellitus due to underlying condition with unspecified complications: Secondary | ICD-10-CM

## 2016-06-28 DIAGNOSIS — E6609 Other obesity due to excess calories: Secondary | ICD-10-CM

## 2016-06-28 LAB — POCT UA - MICROALBUMIN
CREATININE, POC: 200 mg/dL
Microalbumin Ur, POC: 150 mg/L

## 2016-06-28 MED ORDER — SITAGLIPTIN PHOSPHATE 100 MG PO TABS: 50.0000 mg | ORAL_TABLET | Freq: Every day | ORAL | 0 refills | Status: DC

## 2016-06-28 MED ORDER — AMLODIPINE BESYLATE 5 MG PO TABS: 5.0000 mg | ORAL_TABLET | Freq: Every day | ORAL | 0 refills | Status: DC

## 2016-06-28 NOTE — Patient Instructions (Addendum)
Per patient, she wasn't sure what fasting blood sugars and blood pressures should be.  BP: Goal is to be consistently under 130 or less on top and 80 or less on the bottom. Please check it at home and write it down and bring your blood pressures in next office visit for my review  And then the blood sugars just like you're doing continue to check the fasting in the 2 hours after largest meal. Also remember if you ever feel bad check your sugars    Diabetes and Exercise Exercising regularly is important. It is not just about losing weight. It has many health benefits, such as:  Improving your overall fitness, flexibility, and endurance.  Increasing your bone density.  Helping with weight control.  Decreasing your body fat.  Increasing your muscle strength.  Reducing stress and tension.  Improving your overall health. People with diabetes who exercise gain additional benefits because exercise:  Reduces appetite.  Improves the body's use of blood sugar (glucose).  Helps lower or control blood glucose.  Decreases blood pressure.  Helps control blood lipids (such as cholesterol and triglycerides).  Improves the body's use of the hormone insulin by:  Increasing the body's insulin sensitivity.  Reducing the body's insulin needs.  Decreases the risk for heart disease because exercising:  Lowers cholesterol and triglycerides levels.  Increases the levels of good cholesterol (such as high-density lipoproteins [HDL]) in the body.  Lowers blood glucose levels. YOUR ACTIVITY PLAN  Choose an activity that you enjoy, and set realistic goals. To exercise safely, you should begin practicing any new physical activity slowly, and gradually increase the intensity of the exercise over time. Your health care provider or diabetes educator can help create an activity plan that works for you. General recommendations include:  Encouraging children to engage in at least 60 minutes of  physical activity each day.  Stretching and performing strength training exercises, such as yoga or weight lifting, at least 2 times per week.  Performing a total of at least 150 minutes of moderate-intensity exercise each week, such as brisk walking or water aerobics.  Exercising at least 3 days per week, making sure you allow no more than 2 consecutive days to pass without exercising.  Avoiding long periods of inactivity (90 minutes or more). When you have to spend an extended period of time sitting down, take frequent breaks to walk or stretch. RECOMMENDATIONS FOR EXERCISING WITH TYPE 1 OR TYPE 2 DIABETES   Check your blood glucose before exercising. If blood glucose levels are greater than 240 mg/dL, check for urine ketones. Do not exercise if ketones are present.  Avoid injecting insulin into areas of the body that are going to be exercised. For example, avoid injecting insulin into:  The arms when playing tennis.  The legs when jogging.  Keep a record of:  Food intake before and after you exercise.  Expected peak times of insulin action.  Blood glucose levels before and after you exercise.  The type and amount of exercise you have done.  Review your records with your health care provider. Your health care provider will help you to develop guidelines for adjusting food intake and insulin amounts before and after exercising.  If you take insulin or oral hypoglycemic agents, watch for signs and symptoms of hypoglycemia. They include:  Dizziness.  Shaking.  Sweating.  Chills.  Confusion.  Drink plenty of water while you exercise to prevent dehydration or heat stroke. Body water is lost during exercise and  must be replaced.  Talk to your health care provider before starting an exercise program to make sure it is safe for you. Remember, almost any type of activity is better than none.   This information is not intended to replace advice given to you by your health care  provider. Make sure you discuss any questions you have with your health care provider.   Document Released: 11/04/2003 Document Revised: 12/29/2014 Document Reviewed: 01/21/2013 Elsevier Interactive Patient Education [employer].

## 2016-06-28 NOTE — Progress Notes (Signed)
Impression and Recommendations:    1. Diabetes mellitus due to underlying condition, controlled, with complication, with long-term current use of insulin ([name] [name])   2. Need for prophylactic vaccination and inoculation against influenza   3. Microalbuminuric diabetic nephropathy   4. Stage 3 chronic kidney disease   5. Class 1 obesity due to excess calories without serious comorbidity in adult, unspecified BMI   6. Essential hypertension   7. Depression, major, in remission ([name] [name])   8. Obstructive Sleep Apnea    INstructions given to pt:  - Per patient, she wasn't sure what fasting blood sugars and blood pressures should be.  - BP: Goal is to be consistently under 130 or less on top and 80 or less on the bottom. Please check it at home and write it down and bring your blood pressures in next office visit for my review  - And then the blood sugars just like you're doing continue to check the fasting in the 2 hours after largest meal. Also remember if you ever feel bad check your sugars   Chronic kidney disease Control BS and BP with goal around 130/80 due to CKD and DM  Diabetes mellitus due to underlying condition, controlled, with long-term current use of insulin ([name] [name]) Unable to tol metformin- due to Gi s-e.  We took pt of glimepiride last OV  - Now on Januvia ( DPP-4)  and Victoza ( GLP-1)  and Lantus at night.     - Will consider adding SGLT-2- (  Invokana or jardiance or farziga)  in future if not under addequate control in future +/- inc lantus depending on BS levels thru-out the day  - Counseled patient on pathophysiology of disease and discussed various treatment options, which often includes dietary and lifestyle modifications as first line.  Importance of low carb/ketogenic diet discussed with patient in addition to regular exercise.   - Check FBS and 2 hours after the biggest meal of your day.  Keep log and bring in next OV for my review.   Also, if you ever feel poorly,  please check your blood pressure and blood sugar, as one or the other could be the cause of your symptoms.  - Being a diabetic, you need yearly eye and foot exams. Make appt.for diabetic eye exam   - Contact us prior with any questions/ concerns.  Obesity Wt loss advised; counseling done  Essential hypertension Initial BP today was 143/ 84 and at home, has been higher lately.  Inc amlodipine to 5mg  QD and cont to monitor.   Importance of ambulatory blood pressure monitoring d/c pt  DASH diet.   Routine exercise   Depression, major, in remission ([name] [name])  Followed and txed by providers at [facility] (formerly [facility]  Told pt improtant to f/up sooner than planned with them if she is not feeling well.  She denies SI/HI    New Prescriptions   SITAGLIPTIN (JANUVIA) 100 MG TABLET    Take 0.5 tablets (50 mg total) by mouth daily.    Modified Medications   Modified Medication Previous Medication   AMLODIPINE (NORVASC) 5 MG TABLET amLODipine (NORVASC) 5 MG tablet      Take 1 tablet (5 mg total) by mouth daily.    Take 0.5 tablets (2.5 mg total) by mouth daily.    Discontinued Medications   GLIMEPIRIDE (AMARYL) 4 MG TABLET    Take 8 mg by mouth daily with breakfast.    SITAGLIPTIN (JANUVIA) 100  MG TABLET    Take 1 tablet by mouth daily.    The patient was counseled, risk factors were discussed, anticipatory guidance given.  Gross side effects, risk and benefits, and alternatives of medications and treatment plan in general discussed with patient.  Patient is aware that all medications have potential side effects and we are unable to predict every side effect or drug-drug interaction that may occur.   Patient will call with any questions prior to using medication if they have concerns.  Expresses verbal understanding and consents to current therapy and treatment regimen.  No barriers to understanding were identified.  Red flag symptoms and signs discussed in detail.   Patient expressed understanding regarding what to do in case of emergency\urgent symptoms  Return in about 6 weeks (around 08/09/2016) for A1c, reck -doubled amlodipine; since starting januvia, d/cing glimepiride.  Please see AVS handed out to patient at the end of our visit for further patient instructions/ counseling done pertaining to today's office visit.    Note: This document was prepared using Dragon voice recognition software and may include unintentional dictation errors.   --------------------------------------------------------------------------------------------------------------------------------------------------------------------------------------------------------------------------------------------    Subjective:    CC:  Chief Complaint  Patient presents with  . Diabetes    HPI: [name] [name] is a 65 y.o. female who presents to [place] at [facility] today for issues as discussed below.    ----> Here b/c last OV we started pt on victoza and also her BP was up- so reck on that as well.   HTN;  BP elevated last couple OV's. Much improved today b/c she was seen by her Cards doc and Dr Harrington Challenger started pt on 2.5 amlodipine.   Tol well, no S-E; denies feet swelling- Has checked occ and has been running higher than today in general.  Denies Sx   DM:  Started on Victoza- tolerating well. No S-E.   Unable to tol metformin- due to Gi s-e.  Now on Januvia ( DPP-4)  and Victoza ( GLP-1)  and Lantus at night.    Patient has been very good about checking her blood sugars. Fasting blood sugar highest 210 and the lowest was at 89;  2 hour postprandial highest was at 268 and the lowest was at 98 one time.  Pt will go for long periods w/o eating- 8 hrs or more at times.  Denies Sx.    Been under more stress lately- very stressed.  Turmoil btwn her D-in-L and her, she lives with them.   Doesn't feel welcomed.  UNable to exercise or eat healthy- not taking care of  herself. Denies depression/ anxiety.  She has psychiatrist---doesn't recall name of Doc      Wt Readings from Last 3 Encounters:  06/28/16 173 lb 4.8 oz (78.6 kg)  06/12/16 181 lb 1.9 oz (82.2 kg)  05/10/16 184 lb 3.2 oz (83.6 kg)   BP Readings from Last 3 Encounters:  06/28/16 136/83  06/12/16 (!) 162/94  04/05/16 (!) 145/82   Pulse Readings from Last 3 Encounters:  06/28/16 96  06/12/16 (!) 101  05/10/16 87   BMI Readings from Last 3 Encounters:  06/28/16 29.29 kg/m  06/12/16 30.61 kg/m  05/10/16 31.13 kg/m     Patient Care Team    Relationship Specialty Notifications Start End  Pcp Not In System PCP - General   03/10/16    Comment: Merged  [name], DO  Family Medicine  03/10/16    Comment: Aggie Hacker  Rogue Bussing, [place] Physician Obstetrics and Gynecology  04/03/16   Landis Martins, DPM Consulting Physician Podiatry  04/03/16   [facility] Consulting Physician Orthopedic Surgery  04/03/16   Fay Records, MD Consulting Physician Cardiology  04/03/16   Melvenia Beam, MD Consulting Physician Neurology  04/03/16   Inda Castle, MD Consulting Physician Gastroenterology  05/10/16    Comment: Hepatitis C  Manus Gunning, MD Consulting Physician Gastroenterology  05/12/16   [facility] Consulting Physician Urology  05/12/16   Collene Gobble, MD Consulting Physician Pulmonary Disease  07/01/16     Patient Active Problem List   Diagnosis Date Noted  . Diabetes mellitus due to underlying condition, controlled, with long-term current use of insulin ([place]) 05/10/2016    Priority: High  . Obesity 03/18/2016    Priority: High  . Chronic kidney disease 03/08/2016    Priority: High  . Essential hypertension 06/27/2011    Priority: High  . Chronic Diarrhea 07/27/2008    Priority: High  . COPD (chronic obstructive pulmonary disease) ([place]) 03/18/2016    Priority: Medium  . CAD (coronary artery disease) 03/18/2016     Priority: Medium  . Aortic stenosis 03/18/2016    Priority: Medium  . Daytime hypersomnolence- OSA 09/15/2015    Priority: Medium  . Bad memory 07/10/2015    Priority: Medium  . Chronic hypoxic respiratory failure:  home O2 (Drs West Carbo and Young) 08/04/2014    Priority: Medium  . Personal history of transient ischemic attack (TIA), and cerebral infarction without residual deficits 11/20/2013    Priority: Medium  . h/o Acute pulmonary embolism ([place]) 06/21/2012    Priority: Medium  . Obstructive Sleep Apnea 09/08/2011    Priority: Medium  . Back pain, chronic 06/27/2011    Priority: Medium  . Depression, major, in remission ([place]) 06/27/2011    Priority: Medium  . Mixed hyperlipidemia 11/18/2009    Priority: Medium  . CAROTID ARTERY STENOSIS, WITHOUT INFARCTION 11/08/2009    Priority: Medium  .  chronic DIZZINESS 11/18/2008    Priority: Medium  . Chronic hepatitis C virus infection ([place]) 10/02/2007    Priority: Medium  . Esophageal reflux 10/02/2007    Priority: Medium  . Chronic lower back pain 05/10/2016    Priority: Low  . Generalized OA 05/10/2016    Priority: Low  . Mixed incontinence 07/10/2015    Priority: Low  . h/o freq CANDIDIASIS, ORAL 08/25/2008    Priority: Low  . Microalbuminuric diabetic nephropathy 06/28/2016  . Chronic headaches ( migraineous)  03/18/2016  . History of genital warts 03/18/2016  . Chronic dryness of both eyes 03/18/2016  . Int Hemorrhoids: chronic blood in stool   . New onset of headaches after age 31 09/15/2015  . Snoring 09/15/2015  . 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 Frailty 07/10/2015  . Altered mental state 07/01/2015  . Allergic rhinitis 01/22/2014  . h/o Anemia 11/19/2013  . Problem situation relating to social and personal history 09/18/2013  . Polypharmacy 07/02/2012  . Other long term (current) drug therapy 07/02/2012  . CHF (congestive heart failure) ([place]) 06/13/2012  . Fatty liver disease, nonalcoholic [id number]    . Absence of bladder continence 06/27/2011  . Malaise and fatigue: Frailty, chronic deconditioning 11/08/2010  . HYPERURICEMIA 07/29/2009  . Polyuria 07/27/2008  . Gastroparesis 10/02/2007  . Dysphagia 10/02/2007    Past Medical history, Surgical history, Family history, Social history, Allergies and Medications have been entered into the medical record, reviewed and  changed as needed.   Allergies:  Allergies  Allergen Reactions  . Sunflower Oil Anaphylaxis  . Iodine Nausea And Vomiting    Fish causes n/v  . Metoclopramide Hcl Other (See Comments)    Nerves tense up, panic attack  . Sulfamethoxazole Nausea And Vomiting  . Reglan [Metoclopramide]   . Shellfish Allergy Nausea And Vomiting  . Sulfa Antibiotics   . Sulfamethoxazole-Trimethoprim Nausea And Vomiting  . Metformin And Related Diarrhea    Review of Systems  Constitutional: Negative.  Negative for chills, diaphoresis, fever, malaise/fatigue and weight loss.  HENT: Negative.  Negative for congestion, sore throat and tinnitus.   Eyes: Negative.  Negative for blurred vision, double vision and photophobia.  Respiratory: Negative.  Negative for cough and wheezing.   Cardiovascular: Positive for palpitations. Negative for chest pain.  Gastrointestinal: Negative.  Negative for blood in stool, diarrhea, nausea and vomiting.  Genitourinary: Negative.  Negative for dysuria, frequency and urgency.  Musculoskeletal: Negative.  Negative for joint pain and myalgias.  Skin: Negative.  Negative for itching and rash.  Neurological: Negative.  Negative for dizziness, focal weakness, weakness and headaches.  Endo/Heme/Allergies: Negative.  Negative for environmental allergies and polydipsia. Does not bruise/bleed easily.  Psychiatric/Behavioral: Negative.  Negative for depression and memory loss. The patient is not nervous/anxious and does not have insomnia.      Objective:   Blood pressure 136/83, pulse 96, weight 173 lb 4.8 oz  (78.6 kg). Body mass index is 29.29 kg/m. General: Well Developed, well nourished, appropriate for stated age.  Neuro: Alert and oriented x3, extra-ocular muscles intact, sensation grossly intact.  HEENT: Normocephalic, atraumatic, neck supple, no bruits apprec   Skin: Warm and dry, no gross rash. Cardiac: RRR, S1 S2,  no murmurs rubs or gallops.  Respiratory: ECTA B/L, Not using accessory muscles, speaking in full sentences-unlabored. Vascular:  No gross lower ext edema, cap RF less 2 sec. Psych: No HI/SI, judgement and insight good, Euthymic mood. Full Affect.

## 2016-07-01 NOTE — Assessment & Plan Note (Addendum)
Unable to tol metformin- due to Gi s-e.  We took pt of glimepiride last OV  - Now on Januvia ( DPP-4)  and Victoza ( GLP-1)  and Lantus at night.     - Will consider adding SGLT-2- (  Invokana or jardiance or farziga)  in future if not under addequate control in future +/- inc lantus depending on BS levels thru-out the day  - Counseled patient on pathophysiology of disease and discussed various treatment options, which often includes dietary and lifestyle modifications as first line.  Importance of low carb/ketogenic diet discussed with patient in addition to regular exercise.   - Check FBS and 2 hours after the biggest meal of your day.  Keep log and bring in next OV for my review.   Also, if you ever feel poorly, please check your blood pressure and blood sugar, as one or the other could be the cause of your symptoms.  - Being a diabetic, you need yearly eye and foot exams. Make appt.for diabetic eye exam   - Contact us prior with any questions/ concerns.

## 2016-07-01 NOTE — Assessment & Plan Note (Signed)
Initial BP today was 143/ 84 and at home, has been higher lately.  Inc amlodipine to 5mg  QD and cont to monitor.   Importance of ambulatory blood pressure monitoring d/c pt  DASH diet.   Routine exercise

## 2016-07-01 NOTE — Assessment & Plan Note (Signed)
Wt loss advised; counseling done

## 2016-07-01 NOTE — Assessment & Plan Note (Signed)
Followed and txed by providers at [facility] (formerly [facility]).  Told pt improtant to f/up sooner than planned with them if she is not feeling well.  She denies SI/HI

## 2016-07-01 NOTE — Assessment & Plan Note (Signed)
Control BS and BP with goal around 130/80 due to CKD and DM

## 2016-07-03 ENCOUNTER — Other Ambulatory Visit: Payer: Self-pay

## 2016-07-03 ENCOUNTER — Telehealth: Payer: Self-pay

## 2016-07-03 MED ORDER — AZELASTINE HCL 0.1 % NA SOLN: NASAL | 2 refills | Status: DC

## 2016-07-03 MED ORDER — CANAGLIFLOZIN 100 MG PO TABS: 100.0000 mg | ORAL_TABLET | Freq: Every day | ORAL | 0 refills | Status: DC

## 2016-07-03 MED ORDER — LISINOPRIL 40 MG PO TABS: 40.0000 mg | ORAL_TABLET | Freq: Every day | ORAL | 12 refills | Status: DC

## 2016-07-03 MED ORDER — PANTOPRAZOLE SODIUM 40 MG PO TBEC: 40.0000 mg | DELAYED_RELEASE_TABLET | Freq: Every day | ORAL | 5 refills | Status: DC

## 2016-07-03 MED ORDER — SITAGLIPTIN PHOSPHATE 100 MG PO TABS: 100.0000 mg | ORAL_TABLET | Freq: Every day | ORAL | 0 refills | Status: DC

## 2016-07-03 MED ORDER — HYDROXYZINE HCL 25 MG PO TABS: ORAL_TABLET | ORAL | 1 refills | Status: DC

## 2016-07-03 MED ORDER — DULOXETINE HCL 30 MG PO CPEP: 30.0000 mg | ORAL_CAPSULE | Freq: Every day | ORAL | 0 refills | Status: DC

## 2016-07-03 NOTE — Telephone Encounter (Signed)
Per Dr. Raliegh Scarlet, will start Invokana 100mg  daily.  Pt informed and expressed understanding.  Charyl Bigger, CMA

## 2016-07-03 NOTE — Telephone Encounter (Signed)
Patient needs a refill of pantoprazole, duloxetine, hydroxyzine, lisinopril, azelastine. Patient also a few questions about her insulin regiment.

## 2016-07-03 NOTE — Telephone Encounter (Signed)
Pt states that she is indeed taking Januvia, but instead of taking 50mg  daily she has been taking 100mg  daily.  Pt states that she thought you wanted to increase her insulin if she is taking Januvia.  Please advise.  Charyl Bigger, CMA

## 2016-07-04 ENCOUNTER — Encounter: Payer: Self-pay | Admitting: Gastroenterology

## 2016-07-04 ENCOUNTER — Ambulatory Visit (INDEPENDENT_AMBULATORY_CARE_PROVIDER_SITE_OTHER): Payer: Medicare Other | Admitting: Gastroenterology

## 2016-07-04 ENCOUNTER — Other Ambulatory Visit: Payer: Self-pay

## 2016-07-04 ENCOUNTER — Other Ambulatory Visit (INDEPENDENT_AMBULATORY_CARE_PROVIDER_SITE_OTHER): Payer: Medicare Other

## 2016-07-04 VITALS — BP 130/88 | HR 92 | Ht 64.0 in | Wt 186.5 lb

## 2016-07-04 DIAGNOSIS — K529 Noninfective gastroenteritis and colitis, unspecified: Secondary | ICD-10-CM

## 2016-07-04 DIAGNOSIS — R1310 Dysphagia, unspecified: Secondary | ICD-10-CM

## 2016-07-04 DIAGNOSIS — R748 Abnormal levels of other serum enzymes: Secondary | ICD-10-CM | POA: Diagnosis not present

## 2016-07-04 DIAGNOSIS — K649 Unspecified hemorrhoids: Secondary | ICD-10-CM

## 2016-07-04 LAB — HEPATIC FUNCTION PANEL
ALBUMIN: 4.3 g/dL (ref 3.5–5.2)
ALT: 47 U/L — ABNORMAL HIGH (ref 0–35)
AST: 57 U/L — AB (ref 0–37)
Alkaline Phosphatase: 54 U/L (ref 39–117)
BILIRUBIN TOTAL: 0.4 mg/dL (ref 0.2–1.2)
Bilirubin, Direct: 0.1 mg/dL (ref 0.0–0.3)
Total Protein: 7.8 g/dL (ref 6.0–8.3)

## 2016-07-04 MED ORDER — HYDROXYZINE HCL 25 MG PO TABS: ORAL_TABLET | ORAL | 1 refills | Status: DC

## 2016-07-04 MED ORDER — AZELASTINE HCL 0.1 % NA SOLN: NASAL | 2 refills | Status: AC

## 2016-07-04 MED ORDER — PANTOPRAZOLE SODIUM 40 MG PO TBEC: 40.0000 mg | DELAYED_RELEASE_TABLET | Freq: Every day | ORAL | 1 refills | Status: DC

## 2016-07-04 MED ORDER — DULOXETINE HCL 30 MG PO CPEP: 30.0000 mg | ORAL_CAPSULE | Freq: Every day | ORAL | 0 refills | Status: AC

## 2016-07-04 NOTE — Telephone Encounter (Signed)
I will refill the protonix for this patient, but lisinopril needs to come from primary care. I will give her 30 days of lisinopril until primary care can refill this. thanks

## 2016-07-04 NOTE — Patient Instructions (Addendum)
If you are age 64 or older, your body mass index should be between 23-30. Your Body mass index is 32.01 kg/m. If this is out of the aforementioned range listed, please consider follow up with your Primary Care Provider.  If you are age 34 or younger, your body mass index should be between 19-25. Your Body mass index is 32.01 kg/m. If this is out of the aformentioned range listed, please consider follow up with your Primary Care Provider.   Your physician has requested that you go to the basement for the following lab work before leaving today: LFT  You have been scheduled for an endoscopy. Please follow written instructions given to you at your visit today. If you use inhalers (even only as needed), please bring them with you on the day of your procedure.

## 2016-07-04 NOTE — Progress Notes (Signed)
HPI :  65 y/o female here for follow up. She has a history of DM, CAD, oxygen dependant COPD, gastroparesis, and chronic diarrhea. Also with a reported history of hepatitis C that was never treated, but labs showed undetectable viral load in 2015.  Since her last visit she had a colonoscopy with findings of hemorrhoids only and no polyps. She had normal colonic mucosa on biopsies without microscopic colitis.   She reports today she has been having dysphagia for the past 3 months. She reports it occurs to meats and other hard vegetables, as well as pills. She feels it in the sternal notch. She drinks water to help push food down. She has had a prior EGD with dilation in 2013 which she responded well to in the past. She reports these symptoms are similar to prior when she had endoscopy. She is taking protonix once daily and states her heartburn has been well controlled on this regimen. She is on regular aspirin for history of CAD.   She reports bowel movements are doing better at this time and quite happy with present regimen. She is taking colestipol which is working for her. She is also taking immodium PRN which she has not been needing to take.   She otherwise has a history of fatty liver disease and last LFTs in 2016 showing mild AST > ALT elevation . She has a history of hep C which she reports spontaneously cleared, last HCV RNA was negative  She has had a history of perianal skin tags, referred to Dermatologist since our last visit for further evaluation. We have not received records for this visit. She reports she has some rare bleeding from hemorrhoids and is wishing for therapy.   Colonoscopy 12/08/2015 - hemorrhoids, tortous colon, skin tags - biopsies taken with no evidence of microscopic colitis  EGD 05/27/2012 - benign esophageal stricture, otherwise normal, dilated 67 Fr  Past Medical History:  Diagnosis Date  . Altered mental state 07/01/2015  . Anemia   . Anxiety   . Aortic  stenosis    Echo 8/13: EF 55-60%, Mild LVH, mild AS, mean gradient 12 mmHg,   . Benign neoplasm of stomach   . CAD (coronary artery disease)    a.  cath 2005: LAD 40%;  b. Myoview 3/12: EF 70%, no ischemia;  c. Myoview 9/13: EF 76%, no ischemia  . CAROTID ARTERY STENOSIS, WITHOUT INFARCTION 11/08/2009   Overview:  Overview:   Last Assessment & Plan:  She needs follow up carotid dopplers.  I will arrange. Overview:  2005-40 - 59% stenosis of RICA.  May have had f/u study since then but couldn't find results in cardiology notes     . CHF (congestive heart failure) ([place]) 06-13-12   episode after Cancer surgery  . Chronic headaches   . Chronic kidney disease    "my kidneys have been about to shut down" (07/01/2015)  . Chronic lower back pain   . COPD (chronic obstructive pulmonary disease) ([place])   . Depression, major, in remission ([place]) 06/27/2011   Overview:  Also dx'd as PTSD. Followed by [facility] ([facility])   . Dizziness and giddiness   . Esophageal reflux   . Fibromyalgia   . Gastroparesis   . History of blood transfusion    "when I had my hemorrhoid OR"  . Macular degeneration   . Malaise and fatigue 11/08/2010   Overview:  Last Assessment & Plan:  She describes symptoms c/w sleep apnea.  I will send her for sleep medicine consultation.   . Mixed hyperlipidemia 11/18/2009   Qualifier: Diagnosis of  By: Mack Guise    . Mixed incontinence 07/10/2015  . Obesity 03/18/2016  . Obstructive apnea 09/08/2011   NPSG-02/28/11- AHI 10.8/ hr, desat to 77% on room air, body weight 184 lbs   . Ovarian cancer ([place]) 2000; 2002  . Personal history of transient ischemic attack (TIA), and cerebral infarction without residual deficits 11/20/2013     Past Surgical History:  Procedure Laterality Date  . ABDOMINAL HYSTERECTOMY    . ANTERIOR AND POSTERIOR VAGINAL REPAIR    . APPENDECTOMY    . BACK SURGERY    . BAND HEMORRHOIDECTOMY    . BREAST BIOPSY Left 1972  .  CATARACT EXTRACTION W/ INTRAOCULAR LENS  IMPLANT, BILATERAL Bilateral 03/2013  . CHOLECYSTECTOMY OPEN    . COLONOSCOPY N/A 08/11/2013   Procedure: COLONOSCOPY;  Surgeon: Inda Castle, MD;  Location: [facility];  Service: Endoscopy;  Laterality: N/A;  . COLONOSCOPY N/A 12/08/2015   Procedure: COLONOSCOPY;  Surgeon: Manus Gunning, MD;  Location: [facility];  Service: Gastroenterology;  Laterality: N/A;  . DILATION AND CURETTAGE OF UTERUS    . ESOPHAGOGASTRODUODENOSCOPY (EGD) WITH ESOPHAGEAL DILATION     Archie Endo 07/01/2015  . KNEE ARTHROSCOPY Left   . LUMBAR DISC SURGERY    . OOPHORECTOMY     1 ovary removed, then later last ovary removed  . TONSILLECTOMY  ~ 1958  . TUBAL LIGATION     Family History  Problem Relation Age of Onset  . Brain cancer Father     spine cancer  . Heart disease Mother   . Hypertension Mother   . Stomach cancer Paternal Grandfather   . Breast cancer Sister   . Kidney disease Sister   . Cancer Sister     breast  . Hypertension Sister   . Diabetes Brother   . Esophageal cancer Brother   . Diabetes Maternal Aunt   . Diabetes Paternal Aunt   . Alcohol abuse Son   . Hyperlipidemia Son   . Hypertension Son   . Cancer Sister     breast  . Colon cancer Neg Hx    Social History  Substance Use Topics  . Smoking status: Never Smoker  . Smokeless tobacco: Never Used  . Alcohol use No     Comment: 07/01/2015 "stopped  in before 2000"   Current Outpatient Prescriptions  Medication Sig Dispense Refill  . amLODipine (NORVASC) 5 MG tablet Take 1 tablet (5 mg total) by mouth daily. 90 tablet 0  . ARIPiprazole (ABILIFY) 5 MG tablet Take 5 mg by mouth at bedtime.    [name] aspirin 325 MG tablet Take 1 tablet (325 mg total) by mouth daily.    [name] atorvastatin (LIPITOR) 10 MG tablet Take 1 tablet (10 mg total) by mouth daily. 90 tablet 0  . azelastine (ASTELIN) 0.1 % nasal spray 2 sprays each nostril four times daily for 3-5 days then twice a day 30 mL 2  . B-D  UF III MINI PEN NEEDLES 31G X 5 MM MISC INJECT 1 UNIT INTO THE SKIN DAILY AT 10 PM 100 each 0  . B-D ULTRA-FINE 33 LANCETS MISC Check blood sugars twice daily, fasting and 2 hours after largest meal of day. 1 each 11  . bisoprolol (ZEBETA) 5 MG tablet TAKE ONE TABLET BY MOUTH ONCE DAILY 30 tablet 6  . canagliflozin (INVOKANA) 100 MG TABS tablet Take 1 tablet (  100 mg total) by mouth daily before breakfast. 90 tablet 0  . colestipol (COLESTID) 1 g tablet Take 1 g by mouth 2 (two) times daily.    . cycloSPORINE (RESTASIS) 0.05 % ophthalmic emulsion Place 1 drop into both eyes 2 (two) times daily.    . diclofenac (VOLTAREN) 75 MG EC tablet TAKE ONE TABLET BY MOUTH TWICE DAILY 60 tablet 0  . DULoxetine (CYMBALTA) 30 MG capsule Take 1 capsule (30 mg total) by mouth daily. 90 capsule 0  . gabapentin (NEURONTIN) 300 MG capsule 2 capsules at breakfast, one capsule at lunch and 2 capsules after dinner    . gemfibrozil (LOPID) 600 MG tablet Take 600 mg by mouth 2 (two) times daily before a meal.    . hydrOXYzine (ATARAX/VISTARIL) 25 MG tablet One capsule every morning as needed for anxiety Take 25 mg by mouth. One capsule every morning as needed for anxiety 30 tablet 1  . Insulin Glargine (LANTUS SOLOSTAR) 100 UNIT/ML Solostar Pen Inject 20 Units into the skin daily at 10 pm. 5 pen PRN  . Liraglutide 18 MG/3ML SOPN Inject 0.1 mLs (0.6 mg total) into the skin every morning. Check FBS daily and write it down 6 mL 3  . lisinopril (PRINIVIL,ZESTRIL) 40 MG tablet Take 1 tablet (40 mg total) by mouth daily. 30 tablet 12  . ondansetron (ZOFRAN) 8 MG tablet Take 1 tablet (8 mg total) by mouth every 8 (eight) hours as needed for nausea or vomiting. 10 tablet 0  . OXYGEN-HELIUM IN 2-3 L by Other route continuous.     . pantoprazole (PROTONIX) 40 MG tablet Take 1 tablet (40 mg total) by mouth daily. 30 tablet 5  . sitaGLIPtin (JANUVIA) 100 MG tablet Take 1 tablet (100 mg total) by mouth daily. 30 tablet 0  . solifenacin  (VESICARE) 5 MG tablet Take 5 mg by mouth daily.     . traZODone (DESYREL) 50 MG tablet Take 50 mg by mouth at bedtime as needed for sleep.     . Vitamin D, Ergocalciferol, (DRISDOL) 50000 units CAPS capsule Take 1 capsule (50,000 Units total) by mouth every 7 (seven) days. 12 capsule 20   No current facility-administered medications for this visit.    Allergies  Allergen Reactions  . Sunflower Oil Anaphylaxis  . Iodine Nausea And Vomiting    Fish causes n/v  . Metoclopramide Hcl Other (See Comments)    Nerves tense up, panic attack  . Sulfamethoxazole Nausea And Vomiting  . Reglan [Metoclopramide]   . Shellfish Allergy Nausea And Vomiting  . Sulfa Antibiotics   . Sulfamethoxazole-Trimethoprim Nausea And Vomiting  . Metformin And Related Diarrhea     Review of Systems: All systems reviewed and negative except where noted in HPI.      Physical Exam: BP 130/88   Pulse 92   Ht 5\' 4"  (1.626 m)   Wt 186 lb 8 oz (84.6 kg)   LMP  (LMP Unknown)   BMI 32.01 kg/m  Constitutional: Pleasant,well-developed, female in no acute distress. HEENT: Normocephalic and atraumatic. Conjunctivae are normal. No scleral icterus. Neck supple.  Cardiovascular: Normal rate, regular rhythm.  Pulmonary/chest: Effort normal and breath sounds normal. No wheezing, rales or rhonchi. Abdominal: Soft, nondistended, nontender. Bowel sounds active throughout. There are no masses palpable. No hepatomegaly. Extremities: no edema Lymphadenopathy: No cervical adenopathy noted. Neurological: Alert and oriented to person place and time. Skin: Skin is warm and dry. No rashes noted. Psychiatric: Normal mood and affect. Behavior is normal.  ASSESSMENT AND PLAN: 65 year old female with multiple medical problems as outlined above, presenting for reassessment of the issues as outlined below:  Dysphagia - history of benign esophageal stricture in 2013 previously dilated with good response. Now with recurrence of  symptoms suspect related to the stricture. I offered her EGD with dilation. I discussed risks and benefits of anesthesia and EGD with dilation with her and she wanted to proceed. Her case must be done at the hospital given her oxygen dependence.   Elevated liver enzymes - remote history of hep C with spontaneous clearance per patient. Last LFTs over a year ago with mild transaminitis. We'll repeat it today to see where the trending. If elevated may likely be due to fatty liver noted on prior imaging.   Perianal skin tag/hemorrhoids - I previously referred her to dermatology to ensure no evidence of condyloma regarding external perianal lesions. I did not see this report and will request this. Otherwise if her symptoms are due to hemorrhoids I offered her a banding procedure. I will await records from dermatology first, and get back to her.   Chronic diarrhea - negative evaluation for microscopic colitis with negative colonoscopy for evaluation for this. Celiac labs negative. Excellent response to colestid, she will continue this for now.  Westmere Cellar, MD [facility] Pager [phone]

## 2016-07-05 ENCOUNTER — Other Ambulatory Visit: Payer: Self-pay

## 2016-07-05 DIAGNOSIS — R7989 Other specified abnormal findings of blood chemistry: Secondary | ICD-10-CM

## 2016-07-05 DIAGNOSIS — R945 Abnormal results of liver function studies: Principal | ICD-10-CM

## 2016-07-07 ENCOUNTER — Other Ambulatory Visit: Payer: Medicare Other

## 2016-07-07 DIAGNOSIS — R945 Abnormal results of liver function studies: Principal | ICD-10-CM

## 2016-07-07 DIAGNOSIS — R7989 Other specified abnormal findings of blood chemistry: Secondary | ICD-10-CM | POA: Diagnosis not present

## 2016-07-08 LAB — HEPATITIS B SURFACE ANTIBODY,QUALITATIVE: Hep B S Ab: NEGATIVE

## 2016-07-08 LAB — HEPATITIS B SURFACE ANTIGEN: Hepatitis B Surface Ag: NEGATIVE

## 2016-07-08 LAB — HEPATITIS A ANTIBODY, TOTAL: Hep A Total Ab: NONREACTIVE

## 2016-07-10 LAB — ANA: Anti Nuclear Antibody(ANA): NEGATIVE

## 2016-07-10 LAB — IGG: IGG (IMMUNOGLOBIN G), SERUM: 1281 mg/dL (ref 694–1618)

## 2016-07-10 LAB — ALPHA-1-ANTITRYPSIN: A-1 Antitrypsin, Ser: 177 mg/dL (ref 83–199)

## 2016-07-11 ENCOUNTER — Encounter: Payer: Self-pay | Admitting: Sports Medicine

## 2016-07-11 ENCOUNTER — Ambulatory Visit (INDEPENDENT_AMBULATORY_CARE_PROVIDER_SITE_OTHER): Payer: Medicare Other | Admitting: Sports Medicine

## 2016-07-11 DIAGNOSIS — E1142 Type 2 diabetes mellitus with diabetic polyneuropathy: Secondary | ICD-10-CM

## 2016-07-11 DIAGNOSIS — B351 Tinea unguium: Secondary | ICD-10-CM

## 2016-07-11 DIAGNOSIS — M79671 Pain in right foot: Secondary | ICD-10-CM | POA: Diagnosis not present

## 2016-07-11 DIAGNOSIS — M79672 Pain in left foot: Secondary | ICD-10-CM

## 2016-07-11 NOTE — Progress Notes (Signed)
Subjective: [name] [name] is a 65 y.o. female patient with history of diabetes who presents to office today complaining of long, painful nails  while ambulating in shoes; unable to trim. Patient states that the glucose reading this morning was not recorded but is always around 200. Patient denies any new changes in medication or new problems. Patient denies any new cramping, numbness, burning or tingling in the legs.  Patient Active Problem List   Diagnosis Date Noted  . Microalbuminuric diabetic nephropathy 06/28/2016  . Diabetes mellitus due to underlying condition, controlled, with long-term current use of insulin ([place]) 05/10/2016  . Chronic lower back pain 05/10/2016  . Generalized OA 05/10/2016  . Obesity 03/18/2016  . COPD (chronic obstructive pulmonary disease) ([place]) 03/18/2016  . Chronic headaches ( migraineous)  03/18/2016  . CAD (coronary artery disease) 03/18/2016  . Aortic stenosis 03/18/2016  . History of genital warts 03/18/2016  . Chronic dryness of both eyes 03/18/2016  . Chronic kidney disease 03/08/2016  . Int Hemorrhoids: chronic blood in stool   . New onset of headaches after age 70 09/15/2015  . Snoring 09/15/2015  . Daytime hypersomnolence- OSA 09/15/2015  . 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 Bad memory 07/10/2015  . Mixed incontinence 07/10/2015  . Frailty 07/10/2015  . Altered mental state 07/01/2015  . Chronic hypoxic respiratory failure:  home O2 (Drs [place] Carbo and Young) 08/04/2014  . Allergic rhinitis 01/22/2014  . Personal history of transient ischemic attack (TIA), and cerebral infarction without residual deficits 11/20/2013  . h/o Anemia 11/19/2013  . Problem situation relating to social and personal history 09/18/2013  . Polypharmacy 07/02/2012  . Other long term (current) drug therapy 07/02/2012  . h/o Acute pulmonary embolism ([place]) 06/21/2012  . CHF (congestive heart failure) ([place]) 06/13/2012  . Fatty liver disease, nonalcoholic [id number]   . Obstructive Sleep Apnea 09/08/2011  . Back pain, chronic 06/27/2011  . Depression, major, in remission ([place]) 06/27/2011  . Absence of bladder continence 06/27/2011  . Essential hypertension 06/27/2011  . Malaise and fatigue: Frailty, chronic deconditioning 11/08/2010  . Mixed hyperlipidemia 11/18/2009  . CAROTID ARTERY STENOSIS, WITHOUT INFARCTION 11/08/2009  . HYPERURICEMIA 07/29/2009  .  chronic DIZZINESS 11/18/2008  . h/o freq CANDIDIASIS, ORAL 08/25/2008  . Chronic Diarrhea 07/27/2008  . Polyuria 07/27/2008  . Chronic hepatitis C virus infection ([place]) 10/02/2007  . Esophageal reflux 10/02/2007  . Gastroparesis 10/02/2007  . Dysphagia 10/02/2007   Current Outpatient Prescriptions on File Prior to Visit  Medication Sig Dispense Refill  . amLODipine (NORVASC) 5 MG tablet Take 1 tablet (5 mg total) by mouth daily. 90 tablet 0  . ARIPiprazole (ABILIFY) 5 MG tablet Take 5 mg by mouth at bedtime.    [name] aspirin 325 MG tablet Take 1 tablet (325 mg total) by mouth daily.    [name] atorvastatin (LIPITOR) 10 MG tablet Take 1 tablet (10 mg total) by mouth daily. 90 tablet 0  . azelastine (ASTELIN) 0.1 % nasal spray 2 sprays each nostril four times daily for 3-5 days then twice a day 30 mL 2  . B-D UF III MINI PEN NEEDLES 31G X 5 MM MISC INJECT 1 UNIT INTO THE SKIN DAILY AT 10 PM 100 each 0  . B-D ULTRA-FINE 33 LANCETS MISC Check blood sugars twice daily, fasting and 2 hours after largest meal of day. 1 each 11  . bisoprolol (ZEBETA) 5 MG tablet TAKE ONE TABLET BY MOUTH ONCE DAILY 30 tablet 6  . canagliflozin (INVOKANA) 100 MG TABS tablet  Take 1 tablet (100 mg total) by mouth daily before breakfast. 90 tablet 0  . colestipol (COLESTID) 1 g tablet Take 1 g by mouth 2 (two) times daily.    . cycloSPORINE (RESTASIS) 0.05 % ophthalmic emulsion Place 1 drop into both eyes 2 (two) times daily.    . diclofenac (VOLTAREN) 75 MG EC tablet TAKE ONE TABLET BY MOUTH TWICE DAILY 60 tablet 0  . DULoxetine  (CYMBALTA) 30 MG capsule Take 1 capsule (30 mg total) by mouth daily. 90 capsule 0  . gabapentin (NEURONTIN) 300 MG capsule 2 capsules at breakfast, one capsule at lunch and 2 capsules after dinner    . gemfibrozil (LOPID) 600 MG tablet Take 600 mg by mouth 2 (two) times daily before a meal.    . hydrOXYzine (ATARAX/VISTARIL) 25 MG tablet One capsule every morning as needed for anxiety Take 25 mg by mouth. One capsule every morning as needed for anxiety 30 tablet 1  . Insulin Glargine (LANTUS SOLOSTAR) 100 UNIT/ML Solostar Pen Inject 20 Units into the skin daily at 10 pm. 5 pen PRN  . Liraglutide 18 MG/3ML SOPN Inject 0.1 mLs (0.6 mg total) into the skin every morning. Check FBS daily and write it down 6 mL 3  . lisinopril (PRINIVIL,ZESTRIL) 40 MG tablet Take 1 tablet (40 mg total) by mouth daily. 30 tablet 12  . ondansetron (ZOFRAN) 8 MG tablet Take 1 tablet (8 mg total) by mouth every 8 (eight) hours as needed for nausea or vomiting. 10 tablet 0  . OXYGEN-HELIUM IN 2-3 L by Other route continuous.     . pantoprazole (PROTONIX) 40 MG tablet Take 1 tablet (40 mg total) by mouth daily. 90 tablet 1  . sitaGLIPtin (JANUVIA) 100 MG tablet Take 1 tablet (100 mg total) by mouth daily. 30 tablet 0  . traZODone (DESYREL) 50 MG tablet Take 50 mg by mouth at bedtime as needed for sleep.     . Vitamin D, Ergocalciferol, (DRISDOL) 50000 units CAPS capsule Take 1 capsule (50,000 Units total) by mouth every 7 (seven) days. 12 capsule 20   No current facility-administered medications on file prior to visit.    Allergies  Allergen Reactions  . Sunflower Oil Anaphylaxis  . Iodine Nausea And Vomiting    Fish causes n/v  . Metoclopramide Hcl Other (See Comments)    Nerves tense up, panic attack  . Sulfamethoxazole Nausea And Vomiting  . Reglan [Metoclopramide]   . Shellfish Allergy Nausea And Vomiting  . Sulfa Antibiotics   . Sulfamethoxazole-Trimethoprim Nausea And Vomiting  . Metformin And Related  Diarrhea    Recent Results (from the past 2160 hour(s))  POCT glycosylated hemoglobin (Hb A1C)     Status: None   Collection Time: 05/10/16 11:22 AM  Result Value Ref Range   Hemoglobin A1C 7.1   POCT UA - Microalbumin     Status: Abnormal   Collection Time: 06/28/16  2:13 PM  Result Value Ref Range   Microalbumin Ur, POC 150 mg/L   Creatinine, POC 200 mg/dL   Albumin/Creatinine Ratio, Urine, POC 30-300   Hepatic function panel     Status: Abnormal   Collection Time: 07/04/16 11:48 AM  Result Value Ref Range   Total Bilirubin 0.4 0.2 - 1.2 mg/dL   Bilirubin, Direct 0.1 0.0 - 0.3 mg/dL   Alkaline Phosphatase 54 39 - 117 U/L   AST 57 (H) 0 - 37 U/L   ALT 47 (H) 0 - 35 U/L   Total Protein  7.8 6.0 - 8.3 g/dL   Albumin 4.3 3.5 - 5.2 g/dL  Hepatitis B Surface AntiBODY     Status: None   Collection Time: 07/07/16  2:29 PM  Result Value Ref Range   Hep B S Ab NEG NEGATIVE  Hepatitis B Surface AntiGEN     Status: None   Collection Time: 07/07/16  2:29 PM  Result Value Ref Range   Hepatitis B Surface Ag NEGATIVE NEGATIVE  Hepatitis A Ab, Total     Status: None   Collection Time: 07/07/16  2:29 PM  Result Value Ref Range   Hep A Total Ab NON REACTIVE NON REACTIVE  IgG     Status: None   Collection Time: 07/07/16  2:29 PM  Result Value Ref Range   IgG (Immunoglobin G), Serum 1,281 694 - 1,618 mg/dL  Antinuclear Antib (ANA)     Status: None   Collection Time: 07/07/16  2:29 PM  Result Value Ref Range   Anit Nuclear Antibody(ANA) NEG NEGATIVE  Anti-Smooth Muscle Antibody, IGG     Status: None (Preliminary result)   Collection Time: 07/07/16  2:29 PM  Result Value Ref Range   Smooth Muscle Ab  <20 U  Alpha-1-Antitrypsin     Status: None   Collection Time: 07/07/16  2:29 PM  Result Value Ref Range   A-1 Antitrypsin, Ser 177 83 - 199 mg/dL    Objective: General: Patient is awake, alert, and oriented x 3 and in no acute distress on O2.  Integument: Skin is warm, dry and supple  bilateral. Nails are tender, long, thickened and dystrophic with subungual debris, consistent with onychomycosis, 1-5 bilateral. No signs of infection. No open lesions or preulcerative lesions present bilateral. Remaining integument unremarkable.  Vasculature:  Dorsalis Pedis pulse 1/4 bilateral. Posterior Tibial pulse  1/4 bilateral. Capillary fill time <3 sec 1-5 bilateral. Scant hair growth to the level of the digits.Temperature gradient within normal limits. No varicosities present bilateral. No edema present bilateral. Subjective tingling to both feet.  Neurology: The patient has intact sensation measured with a 5.07/10g Semmes Weinstein Monofilament at all pedal sites bilateral . Vibratory sensation diminished bilateral with tuning fork. No Babinski sign present bilateral.   Musculoskeletal: No symptomatic pedal deformities noted bilateral. Muscular strength 5/5 in all lower extremity muscular groups bilateral without pain on range of motion . No tenderness with calf compression bilateral.  Assessment and Plan: Problem List Items Addressed This Visit    None    Visit Diagnoses    Dermatophytosis of nail    -  Primary   Foot pain, bilateral       Diabetic polyneuropathy associated with type 2 diabetes mellitus ([place])          -Examined patient. -Discussed and educated patient on diabetic foot care, especially with  regards to the vascular, neurological and musculoskeletal systems.  -Stressed the importance of good glycemic control and the detriment of not  controlling glucose levels in relation to the foot. -Mechanically debrided all nails 1-5 bilateral using sterile nail nipper and filed with dremel without incident  -Answered all patient questions -Patient to return  in 3 months for at risk foot care -Patient advised to call the office if any problems or questions arise in the meantime.  Landis Martins, DPM

## 2016-07-12 LAB — ANTI-SMOOTH MUSCLE ANTIBODY, IGG: Smooth Muscle Ab: <20 U (ref <20)

## 2016-07-13 ENCOUNTER — Other Ambulatory Visit: Payer: Self-pay

## 2016-07-17 ENCOUNTER — Other Ambulatory Visit: Payer: Self-pay

## 2016-07-23 ENCOUNTER — Other Ambulatory Visit: Payer: Self-pay | Admitting: Internal Medicine

## 2016-07-24 ENCOUNTER — Other Ambulatory Visit: Payer: Self-pay

## 2016-07-24 NOTE — Telephone Encounter (Signed)
Received refill request for Vesicare.  RX denied.  Pt is managed by [facility].  Charyl Bigger, CMA

## 2016-07-26 ENCOUNTER — Other Ambulatory Visit: Payer: Self-pay | Admitting: Family Medicine

## 2016-07-26 ENCOUNTER — Ambulatory Visit: Payer: Medicare Other | Admitting: Family Medicine

## 2016-07-26 DIAGNOSIS — F409 Phobic anxiety disorder, unspecified: Secondary | ICD-10-CM

## 2016-07-26 DIAGNOSIS — F5105 Insomnia due to other mental disorder: Principal | ICD-10-CM

## 2016-07-26 NOTE — Telephone Encounter (Signed)
Advised pt that she should contact cardiology for a refill on lisinopril and bisoprolol.  Advised pt that she needs to contact her urologist for refill on solifenacin.  Will discuss refills for gabapentin and trazodone when she returns to the office d/t her illness.  Pt expressed understanding and is agreeable.  Charyl Bigger, CMA

## 2016-07-26 NOTE — Addendum Note (Signed)
Addended by: Fonnie Mu on: 07/26/2016 03:06 PM   Modules accepted: Orders

## 2016-07-26 NOTE — Telephone Encounter (Signed)
Patient is requesting a refill of trazodone, bisoprolol, gabapentin, lisinopril, and solifenacin

## 2016-07-27 ENCOUNTER — Other Ambulatory Visit: Payer: Self-pay | Admitting: Internal Medicine

## 2016-07-27 MED ORDER — LISINOPRIL 40 MG PO TABS: 40.0000 mg | ORAL_TABLET | Freq: Every day | ORAL | 12 refills | Status: DC

## 2016-07-28 ENCOUNTER — Ambulatory Visit (INDEPENDENT_AMBULATORY_CARE_PROVIDER_SITE_OTHER): Payer: Medicare Other | Admitting: Gastroenterology

## 2016-07-28 DIAGNOSIS — Z23 Encounter for immunization: Secondary | ICD-10-CM | POA: Diagnosis not present

## 2016-07-28 MED ORDER — GABAPENTIN 300 MG PO CAPS: 300.0000 mg | ORAL_CAPSULE | ORAL | 1 refills | Status: DC

## 2016-07-28 NOTE — Telephone Encounter (Signed)
Approve the trazodone. #90, f/up to see how she's doing on her insomnia before further refills.   Please make sure she is going to her psychiatrist as we discussed last office visit.  Neurontin she'll need to get from her neurologist-[facility]

## 2016-07-28 NOTE — Telephone Encounter (Signed)
Please address.  Charyl Bigger, CMA

## 2016-07-28 NOTE — Telephone Encounter (Signed)
Please see my last  Note ----> approve trazodone but not the Neurontin

## 2016-07-28 NOTE — Telephone Encounter (Signed)
Please address

## 2016-07-28 NOTE — Addendum Note (Signed)
Addended by: Fonnie Mu on: 07/28/2016 04:13 PM   Modules accepted: Orders

## 2016-07-28 NOTE — Telephone Encounter (Signed)
Please see refill request.  Charyl Bigger, CMA

## 2016-07-30 ENCOUNTER — Other Ambulatory Visit: Payer: Self-pay | Admitting: Family Medicine

## 2016-08-01 ENCOUNTER — Encounter (HOSPITAL_COMMUNITY)
Admission: RE | Disposition: A | Discharge status: Home / Self Care | Payer: Self-pay | Source: Ambulatory Visit | Attending: Gastroenterology

## 2016-08-01 ENCOUNTER — Ambulatory Visit (HOSPITAL_COMMUNITY): Payer: Medicare Other | Admitting: Certified Registered Nurse Anesthetist

## 2016-08-01 ENCOUNTER — Ambulatory Visit (HOSPITAL_COMMUNITY)
Admission: RE | Admit: 2016-08-01 | Discharge: 2016-08-01 | Disposition: A | Discharge status: Home / Self Care | Payer: Medicare Other | Source: Ambulatory Visit | Attending: Gastroenterology | Admitting: Gastroenterology

## 2016-08-01 ENCOUNTER — Encounter (HOSPITAL_COMMUNITY): Payer: Self-pay

## 2016-08-01 DIAGNOSIS — I130 Hypertensive heart and chronic kidney disease with heart failure and stage 1 through stage 4 chronic kidney disease, or unspecified chronic kidney disease: Secondary | ICD-10-CM | POA: Insufficient documentation

## 2016-08-01 DIAGNOSIS — I6529 Occlusion and stenosis of unspecified carotid artery: Secondary | ICD-10-CM | POA: Diagnosis not present

## 2016-08-01 DIAGNOSIS — K3189 Other diseases of stomach and duodenum: Secondary | ICD-10-CM | POA: Diagnosis not present

## 2016-08-01 DIAGNOSIS — K644 Residual hemorrhoidal skin tags: Secondary | ICD-10-CM | POA: Diagnosis not present

## 2016-08-01 DIAGNOSIS — I509 Heart failure, unspecified: Secondary | ICD-10-CM | POA: Diagnosis not present

## 2016-08-01 DIAGNOSIS — K228 Other specified diseases of esophagus: Secondary | ICD-10-CM

## 2016-08-01 DIAGNOSIS — Z9981 Dependence on supplemental oxygen: Secondary | ICD-10-CM | POA: Insufficient documentation

## 2016-08-01 DIAGNOSIS — Z6831 Body mass index (BMI) 31.0-31.9, adult: Secondary | ICD-10-CM | POA: Insufficient documentation

## 2016-08-01 DIAGNOSIS — K222 Esophageal obstruction: Secondary | ICD-10-CM | POA: Insufficient documentation

## 2016-08-01 DIAGNOSIS — R1310 Dysphagia, unspecified: Secondary | ICD-10-CM

## 2016-08-01 DIAGNOSIS — K2980 Duodenitis without bleeding: Secondary | ICD-10-CM | POA: Insufficient documentation

## 2016-08-01 DIAGNOSIS — E669 Obesity, unspecified: Secondary | ICD-10-CM | POA: Insufficient documentation

## 2016-08-01 DIAGNOSIS — K209 Esophagitis, unspecified: Secondary | ICD-10-CM | POA: Diagnosis not present

## 2016-08-01 DIAGNOSIS — I350 Nonrheumatic aortic (valve) stenosis: Secondary | ICD-10-CM | POA: Diagnosis not present

## 2016-08-01 DIAGNOSIS — N189 Chronic kidney disease, unspecified: Secondary | ICD-10-CM | POA: Insufficient documentation

## 2016-08-01 DIAGNOSIS — R4182 Altered mental status, unspecified: Secondary | ICD-10-CM | POA: Insufficient documentation

## 2016-08-01 DIAGNOSIS — F419 Anxiety disorder, unspecified: Secondary | ICD-10-CM | POA: Diagnosis not present

## 2016-08-01 DIAGNOSIS — K3184 Gastroparesis: Secondary | ICD-10-CM | POA: Insufficient documentation

## 2016-08-01 DIAGNOSIS — J449 Chronic obstructive pulmonary disease, unspecified: Secondary | ICD-10-CM | POA: Diagnosis not present

## 2016-08-01 DIAGNOSIS — Z8673 Personal history of transient ischemic attack (TIA), and cerebral infarction without residual deficits: Secondary | ICD-10-CM | POA: Insufficient documentation

## 2016-08-01 DIAGNOSIS — Z7982 Long term (current) use of aspirin: Secondary | ICD-10-CM | POA: Insufficient documentation

## 2016-08-01 DIAGNOSIS — M797 Fibromyalgia: Secondary | ICD-10-CM | POA: Insufficient documentation

## 2016-08-01 DIAGNOSIS — Q402 Other specified congenital malformations of stomach: Secondary | ICD-10-CM | POA: Diagnosis not present

## 2016-08-01 DIAGNOSIS — Z8619 Personal history of other infectious and parasitic diseases: Secondary | ICD-10-CM | POA: Diagnosis not present

## 2016-08-01 DIAGNOSIS — I2510 Atherosclerotic heart disease of native coronary artery without angina pectoris: Secondary | ICD-10-CM | POA: Insufficient documentation

## 2016-08-01 DIAGNOSIS — G8929 Other chronic pain: Secondary | ICD-10-CM | POA: Insufficient documentation

## 2016-08-01 DIAGNOSIS — K317 Polyp of stomach and duodenum: Secondary | ICD-10-CM | POA: Insufficient documentation

## 2016-08-01 DIAGNOSIS — E782 Mixed hyperlipidemia: Secondary | ICD-10-CM | POA: Insufficient documentation

## 2016-08-01 DIAGNOSIS — F329 Major depressive disorder, single episode, unspecified: Secondary | ICD-10-CM | POA: Insufficient documentation

## 2016-08-01 DIAGNOSIS — K219 Gastro-esophageal reflux disease without esophagitis: Secondary | ICD-10-CM | POA: Diagnosis not present

## 2016-08-01 DIAGNOSIS — M545 Low back pain: Secondary | ICD-10-CM | POA: Diagnosis not present

## 2016-08-01 DIAGNOSIS — F4310 Post-traumatic stress disorder, unspecified: Secondary | ICD-10-CM | POA: Insufficient documentation

## 2016-08-01 DIAGNOSIS — Z79899 Other long term (current) drug therapy: Secondary | ICD-10-CM | POA: Insufficient documentation

## 2016-08-01 DIAGNOSIS — E1122 Type 2 diabetes mellitus with diabetic chronic kidney disease: Secondary | ICD-10-CM | POA: Diagnosis not present

## 2016-08-01 DIAGNOSIS — D649 Anemia, unspecified: Secondary | ICD-10-CM | POA: Insufficient documentation

## 2016-08-01 DIAGNOSIS — H3530 Unspecified macular degeneration: Secondary | ICD-10-CM | POA: Insufficient documentation

## 2016-08-01 DIAGNOSIS — G4733 Obstructive sleep apnea (adult) (pediatric): Secondary | ICD-10-CM | POA: Insufficient documentation

## 2016-08-01 DIAGNOSIS — Z8543 Personal history of malignant neoplasm of ovary: Secondary | ICD-10-CM | POA: Insufficient documentation

## 2016-08-01 HISTORY — DX: Other complications of anesthesia, initial encounter: T88.59XA

## 2016-08-01 HISTORY — PX: ESOPHAGOGASTRODUODENOSCOPY: SHX5428

## 2016-08-01 HISTORY — DX: Adverse effect of unspecified anesthetic, initial encounter: T41.45XA

## 2016-08-01 HISTORY — DX: Type 2 diabetes mellitus without complications: E11.9

## 2016-08-01 LAB — GLUCOSE, CAPILLARY: GLUCOSE-CAPILLARY: 142 mg/dL — AB (ref 65–99)

## 2016-08-01 SURGERY — EGD (ESOPHAGOGASTRODUODENOSCOPY)
Anesthesia: Monitor Anesthesia Care

## 2016-08-01 MED ORDER — LACTATED RINGERS IV SOLN
INTRAVENOUS | Status: DC | PRN
  Administered 2016-08-01: 12:00:00 via INTRAVENOUS

## 2016-08-01 MED ORDER — PROPOFOL 500 MG/50ML IV EMUL
INTRAVENOUS | Status: DC | PRN
  Administered 2016-08-01: 125 ug/kg/min via INTRAVENOUS

## 2016-08-01 MED ORDER — PROPOFOL 10 MG/ML IV BOLUS
INTRAVENOUS | Status: AC
  Filled 2016-08-01: qty 60

## 2016-08-01 MED ORDER — PROPOFOL 500 MG/50ML IV EMUL
INTRAVENOUS | Status: DC | PRN
  Administered 2016-08-01: 60 mg via INTRAVENOUS
  Administered 2016-08-01: 30 mg via INTRAVENOUS

## 2016-08-01 MED ORDER — LIDOCAINE 2% (20 MG/ML) 5 ML SYRINGE
INTRAMUSCULAR | Status: AC
  Filled 2016-08-01: qty 10

## 2016-08-01 MED ORDER — EPHEDRINE 5 MG/ML INJ
INTRAVENOUS | Status: AC
  Filled 2016-08-01: qty 20

## 2016-08-01 NOTE — Op Note (Signed)
[facility] Patient Name: [name] [name] Procedure Date: 08/01/2016 MRN: [id number] Attending MD: Carlota Raspberry. Cindi Ghazarian MD, MD Date of Birth: Aug 23, 1951 CSN: [id number] Age: 65 Admit Type: Outpatient Procedure:                Upper GI endoscopy Indications:              Dysphagia Providers:                Remo Lipps P. Haylie Mccutcheon MD, MD, Hilma Favors, RN,                            Ralene Bathe, Technician, Herbie Drape, CRNA Referring MD:              Medicines:                Monitored Anesthesia Care Complications:            No immediate complications. Estimated blood loss:                            Minimal. Estimated Blood Loss:     Estimated blood loss was minimal. Procedure:                Pre-Anesthesia Assessment:                           - Prior to the procedure, a History and Physical                            was performed, and patient medications and                            allergies were reviewed. The patient's tolerance of                            previous anesthesia was also reviewed. The risks                            and benefits of the procedure and the sedation                            options and risks were discussed with the patient.                            All questions were answered, and informed consent                            was obtained. Prior Anticoagulants: The patient has                            taken aspirin, last dose was 1 day prior to                            procedure. ASA Grade Assessment: III - A patient  with severe systemic disease. After reviewing the                            risks and benefits, the patient was deemed in                            satisfactory condition to undergo the procedure.                           After obtaining informed consent, the endoscope was                            passed under direct vision. Throughout the                            procedure, the  patient's blood pressure, pulse, and                            oxygen saturations were monitored continuously. The                            [id number]) scope was introduced through the                            mouth, and advanced to the second part of duodenum.                            The upper GI endoscopy was accomplished without                            difficulty. The patient tolerated the procedure                            well. Scope In: Scope Out: Findings:      Esophagogastric landmarks were identified: the Z-line was found at 38       cm, the gastroesophageal junction was found at 38 cm and the upper       extent of the gastric folds was found at 38 cm from the incisors.      A single medium subepithelial nodule was found in the upper third of the       esophagus, 18 cm from the incisors. Bite on bite biopsies were taken       with a cold forceps for histology.      The exam of the esophagus was otherwise normal. No stenosis appreciated.       Given findings as above, empiric dilation not performed.      The entire examined stomach was normal.      A single 8 mm sessile polypoid lesion was found in the duodenal bulb.       Biopsies were taken with a cold forceps for histology to rule out       adenomatous change.      Nodular mucosa was found in the duodenal bulb consistent with benign       ectopic gastric mucosa. Biopsies were taken with a cold forceps for  histology.      The exam of the duodenum was otherwise normal. Impression:               - Esophagogastric landmarks identified.                           - Subepithelial nodule found in the esophagus                            versus extrinsic compression. Bite on bite biopsies                            obtained. It is possible this could be the cause of                            the patient's symptoms                           - Normal stomach.                           - A single benign  appearing duodenal polyp.                            Biopsied.                           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 Biopsied. Moderate Sedation:      No moderate sedation, case performed with MAC Recommendation:           - Patient has a contact number available for                            emergencies. The signs and symptoms of potential                            delayed complications were discussed with the                            patient. Return to normal activities tomorrow.                            Written discharge instructions were provided to the                            patient.                           - Resume previous diet.                           - Continue present medications.                           -  Await pathology results.                           - Consider EUS to further evaluate esophageal                            nodule pending pathology results Procedure Code(s):        --- Professional ---                           [phone], Esophagogastroduodenoscopy, flexible,                            transoral; with biopsy, single or multiple Diagnosis Code(s):        --- Professional ---                           K22.8, Other specified diseases of esophagus                           K31.7, Polyp of stomach and duodenum                           K31.89, Other diseases of stomach and duodenum                           R13.10, Dysphagia, unspecified CPT copyright 2016 American Medical Association. All rights reserved. The codes documented in this report are preliminary and upon coder review may  be revised to meet current compliance requirements. Remo Lipps P. Taite Schoeppner MD, MD 08/01/2016 12:05:31 PM This report has been signed electronically. Number of Addenda: 0

## 2016-08-01 NOTE — Interval H&P Note (Signed)
History and Physical Interval Note:  [id number] AM  [place]  has presented today for surgery, with the diagnosis of Dysphagia, Abnormal liver enzymes, hemorrhoids, AH  The various methods of treatment have been discussed with the patient and family. After consideration of risks, benefits and other options for treatment, the patient has consented to  Procedure(s): ESOPHAGOGASTRODUODENOSCOPY (EGD) (N/A) as a surgical intervention .  The patient's history has been reviewed, patient examined, no change in status, stable for surgery.  I have reviewed the patient's chart and labs.  Questions were answered to the patient's satisfaction.     [name] [name]

## 2016-08-01 NOTE — Transfer of Care (Signed)
Immediate Anesthesia Transfer of Care Note  Patient: [name] [name]  Procedure(s) Performed: Procedure(s): ESOPHAGOGASTRODUODENOSCOPY (EGD) (N/A)  Patient Location: PACU  Anesthesia Type:MAC  Level of Consciousness: sedated, patient cooperative and responds to stimulation  Airway & Oxygen Therapy: Patient Spontanous Breathing and Patient connected to face mask oxygen  Post-op Assessment: Report given to RN and Post -op Vital signs reviewed and stable  Post vital signs: Reviewed and stable  Last Vitals:  Vitals:   08/01/16 1040  BP: 126/60  Pulse: 94  Resp: (!) 21  Temp: 37.1 C    Last Pain:  Vitals:   08/01/16 1040  TempSrc: Oral         Complications: No apparent anesthesia complications

## 2016-08-01 NOTE — Anesthesia Postprocedure Evaluation (Signed)
Anesthesia Post Note  Patient: [name] [name]  Procedure(s) Performed: Procedure(s) (LRB): ESOPHAGOGASTRODUODENOSCOPY (EGD) (N/A)  Patient location during evaluation: Endoscopy Anesthesia Type: MAC Level of consciousness: awake and alert Pain management: pain level controlled Vital Signs Assessment: post-procedure vital signs reviewed and stable Respiratory status: spontaneous breathing, nonlabored ventilation, respiratory function stable and patient connected to nasal cannula oxygen Cardiovascular status: stable and blood pressure returned to baseline Anesthetic complications: no    Last Vitals:  Vitals:   08/01/16 1206 08/01/16 1220  BP: 140/66 139/63  Pulse: 95 94  Resp: (!) 23 14  Temp: 36.7 C     Last Pain:  Vitals:   08/01/16 1206  TempSrc: Oral                 Montez Hageman

## 2016-08-01 NOTE — Discharge Instructions (Signed)
YOU HAD AN ENDOSCOPIC PROCEDURE TODAY: Refer to the procedure report and other information in the discharge instructions given to you for any specific questions about what was found during the examination. If this information does not answer your questions, please call [place] office at [phone] to clarify.  ° °YOU SHOULD EXPECT: Some feelings of bloating in the abdomen. Passage of more gas than usual. Walking can help get rid of the air that was put into your GI tract during the procedure and reduce the bloating. If you had a lower endoscopy (such as a colonoscopy or flexible sigmoidoscopy) you may notice spotting of blood in your stool or on the toilet paper. Some abdominal soreness may be present for a day or two, also. ° °DIET: Your first meal following the procedure should be a light meal and then it is ok to progress to your normal diet. A half-sandwich or bowl of soup is an example of a good first meal. Heavy or fried foods are harder to digest and may make you feel nauseous or bloated. Drink plenty of fluids but you should avoid alcoholic beverages for 24 hours. If you had a esophageal dilation, please see attached instructions for diet.   ° °ACTIVITY: Your care partner should take you home directly after the procedure. You should plan to take it easy, moving slowly for the rest of the day. You can resume normal activity the day after the procedure however YOU SHOULD NOT DRIVE, use power tools, machinery or perform tasks that involve climbing or major physical exertion for 24 hours (because of the sedation medicines used during the test).  ° °SYMPTOMS TO REPORT IMMEDIATELY: °A gastroenterologist can be reached at any hour. Please call [phone]  for any of the following symptoms:  °Following lower endoscopy (colonoscopy, flexible sigmoidoscopy) °Excessive amounts of blood in the stool  °Significant tenderness, worsening of abdominal pains  °Swelling of the abdomen that is new, acute  °Fever of 100° or  higher  °Following upper endoscopy (EGD, EUS, ERCP, esophageal dilation) °Vomiting of blood or coffee ground material  °New, significant abdominal pain  °New, significant chest pain or pain under the shoulder blades  °Painful or persistently difficult swallowing  °New shortness of breath  °Black, tarry-looking or red, bloody stools ° °FOLLOW UP:  °If any biopsies were taken you will be contacted by phone or by letter within the next 1-3 weeks. Call [phone]  if you have not heard about the biopsies in 3 weeks.  °Please also call with any specific questions about appointments or follow up tests. ° °

## 2016-08-01 NOTE — H&P (View-Only) (Signed)
HPI :  65 y/o female here for follow up. She has a history of DM, CAD, oxygen dependant COPD, gastroparesis, and chronic diarrhea. Also with a reported history of hepatitis C that was never treated, but labs showed undetectable viral load in 2015.  Since her last visit she had a colonoscopy with findings of hemorrhoids only and no polyps. She had normal colonic mucosa on biopsies without microscopic colitis.   She reports today she has been having dysphagia for the past 3 months. She reports it occurs to meats and other hard vegetables, as well as pills. She feels it in the sternal notch. She drinks water to help push food down. She has had a prior EGD with dilation in 2013 which she responded well to in the past. She reports these symptoms are similar to prior when she had endoscopy. She is taking protonix once daily and states her heartburn has been well controlled on this regimen. She is on regular aspirin for history of CAD.   She reports bowel movements are doing better at this time and quite happy with present regimen. She is taking colestipol which is working for her. She is also taking immodium PRN which she has not been needing to take.   She otherwise has a history of fatty liver disease and last LFTs in 2016 showing mild AST > ALT elevation . She has a history of hep C which she reports spontaneously cleared, last HCV RNA was negative  She has had a history of perianal skin tags, referred to Dermatologist since our last visit for further evaluation. We have not received records for this visit. She reports she has some rare bleeding from hemorrhoids and is wishing for therapy.   Colonoscopy 12/08/2015 - hemorrhoids, tortous colon, skin tags - biopsies taken with no evidence of microscopic colitis  EGD 05/27/2012 - benign esophageal stricture, otherwise normal, dilated 21 Fr  Past Medical History:  Diagnosis Date  . Altered mental state 07/01/2015  . Anemia   . Anxiety   . Aortic  stenosis    Echo 8/13: EF 55-60%, Mild LVH, mild AS, mean gradient 12 mmHg,   . Benign neoplasm of stomach   . CAD (coronary artery disease)    a.  cath 2005: LAD 40%;  b. Myoview 3/12: EF 70%, no ischemia;  c. Myoview 9/13: EF 76%, no ischemia  . CAROTID ARTERY STENOSIS, WITHOUT INFARCTION 11/08/2009   Overview:  Overview:   Last Assessment & Plan:  She needs follow up carotid dopplers.  I will arrange. Overview:  2005-40 - 59% stenosis of RICA.  May have had f/u study since then but couldn't find results in cardiology notes     . CHF (congestive heart failure) ([place]) 06-13-12   episode after Cancer surgery  . Chronic headaches   . Chronic kidney disease    "my kidneys have been about to shut down" (07/01/2015)  . Chronic lower back pain   . COPD (chronic obstructive pulmonary disease) ([place])   . Depression, major, in remission ([place]) 06/27/2011   Overview:  Also dx'd as PTSD. Followed by [facility] ([facility])   . Dizziness and giddiness   . Esophageal reflux   . Fibromyalgia   . Gastroparesis   . History of blood transfusion    "when I had my hemorrhoid OR"  . Macular degeneration   . Malaise and fatigue 11/08/2010   Overview:  Last Assessment & Plan:  She describes symptoms c/w sleep apnea.  I will send her for sleep medicine consultation.   . Mixed hyperlipidemia 11/18/2009   Qualifier: Diagnosis of  By: Mack Guise    . Mixed incontinence 07/10/2015  . Obesity 03/18/2016  . Obstructive apnea 09/08/2011   NPSG-02/28/11- AHI 10.8/ hr, desat to 77% on room air, body weight 184 lbs   . Ovarian cancer ([place]) 2000; 2002  . Personal history of transient ischemic attack (TIA), and cerebral infarction without residual deficits 11/20/2013     Past Surgical History:  Procedure Laterality Date  . ABDOMINAL HYSTERECTOMY    . ANTERIOR AND POSTERIOR VAGINAL REPAIR    . APPENDECTOMY    . BACK SURGERY    . BAND HEMORRHOIDECTOMY    . BREAST BIOPSY Left 1972  .  CATARACT EXTRACTION W/ INTRAOCULAR LENS  IMPLANT, BILATERAL Bilateral 03/2013  . CHOLECYSTECTOMY OPEN    . COLONOSCOPY N/A 08/11/2013   Procedure: COLONOSCOPY;  Surgeon: Inda Castle, MD;  Location: [facility] ENDOSCOPY;  Service: Endoscopy;  Laterality: N/A;  . COLONOSCOPY N/A 12/08/2015   Procedure: COLONOSCOPY;  Surgeon: Manus Gunning, MD;  Location: [facility] ENDOSCOPY;  Service: Gastroenterology;  Laterality: N/A;  . DILATION AND CURETTAGE OF UTERUS    . ESOPHAGOGASTRODUODENOSCOPY (EGD) WITH ESOPHAGEAL DILATION     Archie Endo 07/01/2015  . KNEE ARTHROSCOPY Left   . LUMBAR DISC SURGERY    . OOPHORECTOMY     1 ovary removed, then later last ovary removed  . TONSILLECTOMY  ~ 1958  . TUBAL LIGATION     Family History  Problem Relation Age of Onset  . Brain cancer Father     spine cancer  . Heart disease Mother   . Hypertension Mother   . Stomach cancer Paternal Grandfather   . Breast cancer Sister   . Kidney disease Sister   . Cancer Sister     breast  . Hypertension Sister   . Diabetes Brother   . Esophageal cancer Brother   . Diabetes Maternal Aunt   . Diabetes Paternal Aunt   . Alcohol abuse Son   . Hyperlipidemia Son   . Hypertension Son   . Cancer Sister     breast  . Colon cancer Neg Hx    Social History  Substance Use Topics  . Smoking status: Never Smoker  . Smokeless tobacco: Never Used  . Alcohol use No     Comment: 07/01/2015 "stopped  in before 2000"   Current Outpatient Prescriptions  Medication Sig Dispense Refill  . amLODipine (NORVASC) 5 MG tablet Take 1 tablet (5 mg total) by mouth daily. 90 tablet 0  . ARIPiprazole (ABILIFY) 5 MG tablet Take 5 mg by mouth at bedtime.    [name] aspirin 325 MG tablet Take 1 tablet (325 mg total) by mouth daily.    [name] atorvastatin (LIPITOR) 10 MG tablet Take 1 tablet (10 mg total) by mouth daily. 90 tablet 0  . azelastine (ASTELIN) 0.1 % nasal spray 2 sprays each nostril four times daily for 3-5 days then twice a day 30 mL 2  . B-D  UF III MINI PEN NEEDLES 31G X 5 MM MISC INJECT 1 UNIT INTO THE SKIN DAILY AT 10 PM 100 each 0  . B-D ULTRA-FINE 33 LANCETS MISC Check blood sugars twice daily, fasting and 2 hours after largest meal of day. 1 each 11  . bisoprolol (ZEBETA) 5 MG tablet TAKE ONE TABLET BY MOUTH ONCE DAILY 30 tablet 6  . canagliflozin (INVOKANA) 100 MG TABS tablet Take 1 tablet (  100 mg total) by mouth daily before breakfast. 90 tablet 0  . colestipol (COLESTID) 1 g tablet Take 1 g by mouth 2 (two) times daily.    . cycloSPORINE (RESTASIS) 0.05 % ophthalmic emulsion Place 1 drop into both eyes 2 (two) times daily.    . diclofenac (VOLTAREN) 75 MG EC tablet TAKE ONE TABLET BY MOUTH TWICE DAILY 60 tablet 0  . DULoxetine (CYMBALTA) 30 MG capsule Take 1 capsule (30 mg total) by mouth daily. 90 capsule 0  . gabapentin (NEURONTIN) 300 MG capsule 2 capsules at breakfast, one capsule at lunch and 2 capsules after dinner    . gemfibrozil (LOPID) 600 MG tablet Take 600 mg by mouth 2 (two) times daily before a meal.    . hydrOXYzine (ATARAX/VISTARIL) 25 MG tablet One capsule every morning as needed for anxiety Take 25 mg by mouth. One capsule every morning as needed for anxiety 30 tablet 1  . Insulin Glargine (LANTUS SOLOSTAR) 100 UNIT/ML Solostar Pen Inject 20 Units into the skin daily at 10 pm. 5 pen PRN  . Liraglutide 18 MG/3ML SOPN Inject 0.1 mLs (0.6 mg total) into the skin every morning. Check FBS daily and write it down 6 mL 3  . lisinopril (PRINIVIL,ZESTRIL) 40 MG tablet Take 1 tablet (40 mg total) by mouth daily. 30 tablet 12  . ondansetron (ZOFRAN) 8 MG tablet Take 1 tablet (8 mg total) by mouth every 8 (eight) hours as needed for nausea or vomiting. 10 tablet 0  . OXYGEN-HELIUM IN 2-3 L by Other route continuous.     . pantoprazole (PROTONIX) 40 MG tablet Take 1 tablet (40 mg total) by mouth daily. 30 tablet 5  . sitaGLIPtin (JANUVIA) 100 MG tablet Take 1 tablet (100 mg total) by mouth daily. 30 tablet 0  . solifenacin  (VESICARE) 5 MG tablet Take 5 mg by mouth daily.     . traZODone (DESYREL) 50 MG tablet Take 50 mg by mouth at bedtime as needed for sleep.     . Vitamin D, Ergocalciferol, (DRISDOL) 50000 units CAPS capsule Take 1 capsule (50,000 Units total) by mouth every 7 (seven) days. 12 capsule 20   No current facility-administered medications for this visit.    Allergies  Allergen Reactions  . Sunflower Oil Anaphylaxis  . Iodine Nausea And Vomiting    Fish causes n/v  . Metoclopramide Hcl Other (See Comments)    Nerves tense up, panic attack  . Sulfamethoxazole Nausea And Vomiting  . Reglan [Metoclopramide]   . Shellfish Allergy Nausea And Vomiting  . Sulfa Antibiotics   . Sulfamethoxazole-Trimethoprim Nausea And Vomiting  . Metformin And Related Diarrhea     Review of Systems: All systems reviewed and negative except where noted in HPI.      Physical Exam: BP 130/88   Pulse 92   Ht 5\' 4"  (1.626 m)   Wt 186 lb 8 oz (84.6 kg)   LMP  (LMP Unknown)   BMI 32.01 kg/m  Constitutional: Pleasant,well-developed, female in no acute distress. HEENT: Normocephalic and atraumatic. Conjunctivae are normal. No scleral icterus. Neck supple.  Cardiovascular: Normal rate, regular rhythm.  Pulmonary/chest: Effort normal and breath sounds normal. No wheezing, rales or rhonchi. Abdominal: Soft, nondistended, nontender. Bowel sounds active throughout. There are no masses palpable. No hepatomegaly. Extremities: no edema Lymphadenopathy: No cervical adenopathy noted. Neurological: Alert and oriented to person place and time. Skin: Skin is warm and dry. No rashes noted. Psychiatric: Normal mood and affect. Behavior is normal.  ASSESSMENT AND PLAN: 65 year old female with multiple medical problems as outlined above, presenting for reassessment of the issues as outlined below:  Dysphagia - history of benign esophageal stricture in 2013 previously dilated with good response. Now with recurrence of  symptoms suspect related to the stricture. I offered her EGD with dilation. I discussed risks and benefits of anesthesia and EGD with dilation with her and she wanted to proceed. Her case must be done at the hospital given her oxygen dependence.   Elevated liver enzymes - remote history of hep C with spontaneous clearance per patient. Last LFTs over a year ago with mild transaminitis. We'll repeat it today to see where the trending. If elevated may likely be due to fatty liver noted on prior imaging.   Perianal skin tag/hemorrhoids - I previously referred her to dermatology to ensure no evidence of condyloma regarding external perianal lesions. I did not see this report and will request this. Otherwise if her symptoms are due to hemorrhoids I offered her a banding procedure. I will await records from dermatology first, and get back to her.   Chronic diarrhea - negative evaluation for microscopic colitis with negative colonoscopy for evaluation for this. Celiac labs negative. Excellent response to colestid, she will continue this for now.  [place] Cellar, MD [facility] Pager [phone]

## 2016-08-01 NOTE — Anesthesia Preprocedure Evaluation (Signed)
Anesthesia Evaluation    History of Anesthesia Complications (+) PONV  Airway Mallampati: II  TM Distance: >3 FB Neck ROM: Full    Dental no notable dental hx.    Pulmonary COPD,  oxygen dependent,    Pulmonary exam normal breath sounds clear to auscultation       Cardiovascular hypertension, Pt. on medications + CAD and +CHF  Normal cardiovascular exam Rhythm:Regular Rate:Normal     Neuro/Psych Anxiety Depression    GI/Hepatic   Endo/Other  diabetes  Renal/GU      Musculoskeletal  (+) Fibromyalgia -  Abdominal   Peds  Hematology   Anesthesia Other Findings   Reproductive/Obstetrics                             Anesthesia Physical Anesthesia Plan  ASA: III  Anesthesia Plan: MAC   Post-op Pain Management:    Induction: Intravenous  Airway Management Planned: Simple Face Mask and Nasal Cannula  Additional Equipment:   Intra-op Plan:   Post-operative Plan:   Informed Consent: I have reviewed the patients History and Physical, chart, labs and discussed the procedure including the risks, benefits and alternatives for the proposed anesthesia with the patient or authorized representative who has indicated his/her understanding and acceptance.   Dental advisory given  Plan Discussed with: CRNA  Anesthesia Plan Comments:         Anesthesia Quick Evaluation

## 2016-08-02 ENCOUNTER — Encounter (HOSPITAL_COMMUNITY): Payer: Self-pay | Admitting: Gastroenterology

## 2016-08-03 ENCOUNTER — Telehealth: Payer: Self-pay

## 2016-08-03 NOTE — Telephone Encounter (Signed)
-----   Message from Milus Banister, MD sent at 08/03/2016  8:14 AM EST ----- Regarding: RE: possible EUS Richardson Landry, I looked at your pictures.  I think EUS is a good next step in evaluation.  I'll have Danaly Bari get in touch with her, let us know when you have given her a heads up.  Thanks  DJ  ----- Message ----- From: Manus Gunning, MD Sent: 08/02/2016   1:09 PM To: Milus Banister, MD Subject: possible EUS                                   Hi Dan, Was curious if you were interested in an EUS for this patient. She has dysphagia, noted to have a subepithelial lesion in the proximal esophagus. Bite on bite biopsies benign. Not sure if related to her symptoms or not. Can you let me know what you think? Thanks  Richardson Landry

## 2016-08-07 NOTE — Telephone Encounter (Signed)
See alternate note  

## 2016-08-07 NOTE — Telephone Encounter (Signed)
Pt is calling to see if her EUS has been scheduled

## 2016-08-08 NOTE — Telephone Encounter (Signed)
Patient calling in regarding this.  °

## 2016-08-09 ENCOUNTER — Other Ambulatory Visit: Payer: Self-pay

## 2016-08-09 DIAGNOSIS — R1310 Dysphagia, unspecified: Secondary | ICD-10-CM

## 2016-08-09 DIAGNOSIS — K319 Disease of stomach and duodenum, unspecified: Secondary | ICD-10-CM

## 2016-08-09 NOTE — Telephone Encounter (Signed)
EUS scheduled, pt instructed and medications reviewed.  Patient instructions mailed to home.  Patient to call with any questions or concerns.  

## 2016-08-09 NOTE — Telephone Encounter (Signed)
Pt has been instructed, I will add case and written instructions today.

## 2016-08-10 ENCOUNTER — Ambulatory Visit: Payer: Medicare Other | Admitting: Family Medicine

## 2016-08-13 ENCOUNTER — Other Ambulatory Visit: Payer: Self-pay | Admitting: Family Medicine

## 2016-08-13 DIAGNOSIS — E119 Type 2 diabetes mellitus without complications: Secondary | ICD-10-CM

## 2016-08-13 DIAGNOSIS — Z794 Long term (current) use of insulin: Principal | ICD-10-CM

## 2016-08-22 ENCOUNTER — Other Ambulatory Visit: Payer: Self-pay | Admitting: Family Medicine

## 2016-08-22 DIAGNOSIS — E0800 Diabetes mellitus due to underlying condition with hyperosmolarity without nonketotic hyperglycemic-hyperosmolar coma (NKHHC): Secondary | ICD-10-CM

## 2016-08-22 DIAGNOSIS — Z794 Long term (current) use of insulin: Principal | ICD-10-CM

## 2016-08-23 ENCOUNTER — Telehealth: Payer: Self-pay | Admitting: Family Medicine

## 2016-08-23 ENCOUNTER — Ambulatory Visit: Payer: Medicare Other | Admitting: Family Medicine

## 2016-08-23 NOTE — Telephone Encounter (Signed)
Patient was told her pharmacy to contact us, she needs a refill of her insulin pens

## 2016-08-24 NOTE — Telephone Encounter (Signed)
Pt requesting refill on her pen needles.  Advised pt this RX was sent to [facility] on 08/22/16.  Pt states she will check with the pharmacy for a refill.  Charyl Bigger, CMA

## 2016-08-25 ENCOUNTER — Other Ambulatory Visit: Payer: Self-pay

## 2016-08-25 DIAGNOSIS — Z794 Long term (current) use of insulin: Principal | ICD-10-CM

## 2016-08-25 DIAGNOSIS — E0800 Diabetes mellitus due to underlying condition with hyperosmolarity without nonketotic hyperglycemic-hyperosmolar coma (NKHHC): Secondary | ICD-10-CM

## 2016-08-25 MED ORDER — INSULIN PEN NEEDLE 31G X 5 MM MISC: 0 refills | Status: DC

## 2016-08-27 ENCOUNTER — Other Ambulatory Visit: Payer: Self-pay | Admitting: Family Medicine

## 2016-08-29 ENCOUNTER — Ambulatory Visit (INDEPENDENT_AMBULATORY_CARE_PROVIDER_SITE_OTHER): Payer: Medicare Other | Admitting: Gastroenterology

## 2016-08-29 DIAGNOSIS — Z23 Encounter for immunization: Secondary | ICD-10-CM | POA: Diagnosis not present

## 2016-09-04 ENCOUNTER — Encounter (HOSPITAL_COMMUNITY): Payer: Self-pay | Admitting: *Deleted

## 2016-09-07 ENCOUNTER — Encounter (HOSPITAL_COMMUNITY): Payer: Self-pay | Admitting: *Deleted

## 2016-09-07 ENCOUNTER — Ambulatory Visit (HOSPITAL_COMMUNITY)
Admission: RE | Admit: 2016-09-07 | Discharge: 2016-09-07 | Disposition: A | Discharge status: Home / Self Care | Payer: Medicare Other | Source: Ambulatory Visit | Attending: Gastroenterology | Admitting: Gastroenterology

## 2016-09-07 ENCOUNTER — Encounter (HOSPITAL_COMMUNITY)
Admission: RE | Disposition: A | Discharge status: Home / Self Care | Payer: Self-pay | Source: Ambulatory Visit | Attending: Gastroenterology

## 2016-09-07 ENCOUNTER — Ambulatory Visit (HOSPITAL_COMMUNITY): Payer: Medicare Other | Admitting: Registered Nurse

## 2016-09-07 ENCOUNTER — Ambulatory Visit: Payer: Medicare Other | Admitting: Gastroenterology

## 2016-09-07 DIAGNOSIS — K3184 Gastroparesis: Secondary | ICD-10-CM | POA: Insufficient documentation

## 2016-09-07 DIAGNOSIS — I130 Hypertensive heart and chronic kidney disease with heart failure and stage 1 through stage 4 chronic kidney disease, or unspecified chronic kidney disease: Secondary | ICD-10-CM | POA: Insufficient documentation

## 2016-09-07 DIAGNOSIS — K219 Gastro-esophageal reflux disease without esophagitis: Secondary | ICD-10-CM | POA: Diagnosis not present

## 2016-09-07 DIAGNOSIS — Z6831 Body mass index (BMI) 31.0-31.9, adult: Secondary | ICD-10-CM | POA: Insufficient documentation

## 2016-09-07 DIAGNOSIS — K319 Disease of stomach and duodenum, unspecified: Secondary | ICD-10-CM

## 2016-09-07 DIAGNOSIS — N189 Chronic kidney disease, unspecified: Secondary | ICD-10-CM | POA: Diagnosis not present

## 2016-09-07 DIAGNOSIS — R1310 Dysphagia, unspecified: Secondary | ICD-10-CM | POA: Insufficient documentation

## 2016-09-07 DIAGNOSIS — M797 Fibromyalgia: Secondary | ICD-10-CM | POA: Diagnosis not present

## 2016-09-07 DIAGNOSIS — H3530 Unspecified macular degeneration: Secondary | ICD-10-CM | POA: Diagnosis not present

## 2016-09-07 DIAGNOSIS — F419 Anxiety disorder, unspecified: Secondary | ICD-10-CM | POA: Diagnosis not present

## 2016-09-07 DIAGNOSIS — R4182 Altered mental status, unspecified: Secondary | ICD-10-CM | POA: Insufficient documentation

## 2016-09-07 DIAGNOSIS — R51 Headache: Secondary | ICD-10-CM | POA: Insufficient documentation

## 2016-09-07 DIAGNOSIS — I2510 Atherosclerotic heart disease of native coronary artery without angina pectoris: Secondary | ICD-10-CM | POA: Insufficient documentation

## 2016-09-07 DIAGNOSIS — Z91041 Radiographic dye allergy status: Secondary | ICD-10-CM | POA: Insufficient documentation

## 2016-09-07 DIAGNOSIS — K3189 Other diseases of stomach and duodenum: Secondary | ICD-10-CM | POA: Diagnosis not present

## 2016-09-07 DIAGNOSIS — Z91018 Allergy to other foods: Secondary | ICD-10-CM | POA: Insufficient documentation

## 2016-09-07 DIAGNOSIS — E782 Mixed hyperlipidemia: Secondary | ICD-10-CM | POA: Insufficient documentation

## 2016-09-07 DIAGNOSIS — J449 Chronic obstructive pulmonary disease, unspecified: Secondary | ICD-10-CM | POA: Insufficient documentation

## 2016-09-07 DIAGNOSIS — K228 Other specified diseases of esophagus: Secondary | ICD-10-CM | POA: Insufficient documentation

## 2016-09-07 DIAGNOSIS — F4310 Post-traumatic stress disorder, unspecified: Secondary | ICD-10-CM | POA: Insufficient documentation

## 2016-09-07 DIAGNOSIS — Z90710 Acquired absence of both cervix and uterus: Secondary | ICD-10-CM | POA: Diagnosis not present

## 2016-09-07 DIAGNOSIS — E669 Obesity, unspecified: Secondary | ICD-10-CM | POA: Diagnosis not present

## 2016-09-07 DIAGNOSIS — Z8673 Personal history of transient ischemic attack (TIA), and cerebral infarction without residual deficits: Secondary | ICD-10-CM | POA: Diagnosis not present

## 2016-09-07 DIAGNOSIS — Z803 Family history of malignant neoplasm of breast: Secondary | ICD-10-CM | POA: Insufficient documentation

## 2016-09-07 DIAGNOSIS — Z888 Allergy status to other drugs, medicaments and biological substances status: Secondary | ICD-10-CM | POA: Insufficient documentation

## 2016-09-07 DIAGNOSIS — E1122 Type 2 diabetes mellitus with diabetic chronic kidney disease: Secondary | ICD-10-CM | POA: Insufficient documentation

## 2016-09-07 DIAGNOSIS — G4730 Sleep apnea, unspecified: Secondary | ICD-10-CM | POA: Diagnosis not present

## 2016-09-07 DIAGNOSIS — Z8543 Personal history of malignant neoplasm of ovary: Secondary | ICD-10-CM | POA: Insufficient documentation

## 2016-09-07 DIAGNOSIS — Z800 Family history of malignant neoplasm of digestive organs: Secondary | ICD-10-CM | POA: Insufficient documentation

## 2016-09-07 DIAGNOSIS — K229 Disease of esophagus, unspecified: Secondary | ICD-10-CM

## 2016-09-07 DIAGNOSIS — Z882 Allergy status to sulfonamides status: Secondary | ICD-10-CM | POA: Insufficient documentation

## 2016-09-07 DIAGNOSIS — D649 Anemia, unspecified: Secondary | ICD-10-CM | POA: Insufficient documentation

## 2016-09-07 DIAGNOSIS — I350 Nonrheumatic aortic (valve) stenosis: Secondary | ICD-10-CM | POA: Diagnosis not present

## 2016-09-07 DIAGNOSIS — Z833 Family history of diabetes mellitus: Secondary | ICD-10-CM | POA: Insufficient documentation

## 2016-09-07 DIAGNOSIS — Z808 Family history of malignant neoplasm of other organs or systems: Secondary | ICD-10-CM | POA: Insufficient documentation

## 2016-09-07 DIAGNOSIS — K760 Fatty (change of) liver, not elsewhere classified: Secondary | ICD-10-CM | POA: Diagnosis not present

## 2016-09-07 DIAGNOSIS — Z9842 Cataract extraction status, left eye: Secondary | ICD-10-CM | POA: Insufficient documentation

## 2016-09-07 DIAGNOSIS — Z9841 Cataract extraction status, right eye: Secondary | ICD-10-CM | POA: Insufficient documentation

## 2016-09-07 DIAGNOSIS — Z841 Family history of disorders of kidney and ureter: Secondary | ICD-10-CM | POA: Insufficient documentation

## 2016-09-07 DIAGNOSIS — Z811 Family history of alcohol abuse and dependence: Secondary | ICD-10-CM | POA: Insufficient documentation

## 2016-09-07 DIAGNOSIS — Z8249 Family history of ischemic heart disease and other diseases of the circulatory system: Secondary | ICD-10-CM | POA: Insufficient documentation

## 2016-09-07 HISTORY — PX: EUS: SHX5427

## 2016-09-07 LAB — GLUCOSE, CAPILLARY: Glucose-Capillary: 184 mg/dL — ABNORMAL HIGH (ref 65–99)

## 2016-09-07 SURGERY — UPPER ENDOSCOPIC ULTRASOUND (EUS) RADIAL
Anesthesia: Monitor Anesthesia Care

## 2016-09-07 MED ORDER — LIDOCAINE 2% (20 MG/ML) 5 ML SYRINGE
INTRAMUSCULAR | Status: AC
  Filled 2016-09-07: qty 5

## 2016-09-07 MED ORDER — PROPOFOL 10 MG/ML IV BOLUS
INTRAVENOUS | Status: AC
  Filled 2016-09-07: qty 60

## 2016-09-07 MED ORDER — LACTATED RINGERS IV SOLN
INTRAVENOUS | Status: DC
  Administered 2016-09-07: 1000 mL via INTRAVENOUS

## 2016-09-07 MED ORDER — PROPOFOL 500 MG/50ML IV EMUL
INTRAVENOUS | Status: DC | PRN
  Administered 2016-09-07: 100 ug/kg/min via INTRAVENOUS

## 2016-09-07 MED ORDER — ONDANSETRON HCL 4 MG/2ML IJ SOLN
INTRAMUSCULAR | Status: DC | PRN
  Administered 2016-09-07: 4 mg via INTRAVENOUS

## 2016-09-07 MED ORDER — PROPOFOL 10 MG/ML IV BOLUS
INTRAVENOUS | Status: DC | PRN
  Administered 2016-09-07 (×3): 20 mg via INTRAVENOUS
  Administered 2016-09-07: 40 mg via INTRAVENOUS

## 2016-09-07 MED ORDER — ONDANSETRON HCL 4 MG/2ML IJ SOLN
INTRAMUSCULAR | Status: AC
  Filled 2016-09-07: qty 2

## 2016-09-07 MED ORDER — LIDOCAINE 2% (20 MG/ML) 5 ML SYRINGE
INTRAMUSCULAR | Status: DC | PRN
  Administered 2016-09-07: 50 mg via INTRAVENOUS

## 2016-09-07 MED ORDER — SODIUM CHLORIDE 0.9 % IV SOLN: INTRAVENOUS | Status: DC

## 2016-09-07 NOTE — Transfer of Care (Signed)
Immediate Anesthesia Transfer of Care Note  Patient: [name] [name]  Procedure(s) Performed: Procedure(s): UPPER ENDOSCOPIC ULTRASOUND (EUS) RADIAL (N/A)  Patient Location: PACU  Anesthesia Type:MAC  Level of Consciousness: awake, alert  and oriented  Airway & Oxygen Therapy: Patient Spontanous Breathing and Patient connected to nasal cannula oxygen  Post-op Assessment: Report given to RN and Post -op Vital signs reviewed and stable  Post vital signs: Reviewed and stable  Last Vitals:  Vitals:   09/07/16 1028 09/07/16 1104  BP: 138/76 (!) 115/53  Pulse: 94 91  Resp: 20 14  Temp: 36.7 C 36.8 C    Last Pain:  Vitals:   09/07/16 1104  TempSrc: Oral         Complications: No apparent anesthesia complications

## 2016-09-07 NOTE — Anesthesia Preprocedure Evaluation (Signed)
Anesthesia Evaluation  Patient identified by MRN, date of birth, ID band Patient awake    Reviewed: Allergy & Precautions, NPO status , Patient's Chart, lab work & pertinent test results  Airway Mallampati: II  TM Distance: >3 FB Neck ROM: Full    Dental  (+) Teeth Intact, Dental Advisory Given   Pulmonary    breath sounds clear to auscultation       Cardiovascular  Rhythm:Regular Rate:Normal     Neuro/Psych    GI/Hepatic   Endo/Other  diabetes  Renal/GU      Musculoskeletal   Abdominal   Peds  Hematology   Anesthesia Other Findings   Reproductive/Obstetrics                             Anesthesia Physical Anesthesia Plan  ASA: III  Anesthesia Plan: MAC   Post-op Pain Management:    Induction: Intravenous  Airway Management Planned: Natural Airway and Nasal Cannula  Additional Equipment:   Intra-op Plan:   Post-operative Plan:   Informed Consent: I have reviewed the patients History and Physical, chart, labs and discussed the procedure including the risks, benefits and alternatives for the proposed anesthesia with the patient or authorized representative who has indicated his/her understanding and acceptance.   Dental advisory given  Plan Discussed with: CRNA and Anesthesiologist  Anesthesia Plan Comments:         Anesthesia Quick Evaluation

## 2016-09-07 NOTE — Discharge Instructions (Signed)

## 2016-09-07 NOTE — Anesthesia Postprocedure Evaluation (Addendum)
Anesthesia Post Note  Patient: [name] [name]  Procedure(s) Performed: Procedure(s) (LRB): UPPER ENDOSCOPIC ULTRASOUND (EUS) RADIAL (N/A)  Patient location during evaluation: Endoscopy Anesthesia Type: MAC Level of consciousness: awake, awake and alert and oriented Pain management: pain level controlled Vital Signs Assessment: post-procedure vital signs reviewed and stable Respiratory status: spontaneous breathing, nonlabored ventilation and respiratory function stable Cardiovascular status: blood pressure returned to baseline Anesthetic complications: no       Last Vitals:  Vitals:   09/07/16 1110 09/07/16 1120  BP: (!) 114/53 (!) 121/57  Pulse:  63  Resp:    Temp:      Last Pain:  Vitals:   09/07/16 1104  TempSrc: Oral                 [name] [name]

## 2016-09-07 NOTE — H&P (Signed)
HPI: This is a 66 yo woman with severe copd  Chief complaint is abnormal esophagus on recent EGD.  Also dysphagia to liquids and solids; feels like things hang in back of her throat  ROS: complete GI ROS as described in HPI.  Constitutional:  No unintentional weight loss   Past Medical History:  Diagnosis Date  . Altered mental state 07/01/2015  . Anemia   . Anxiety   . Aortic stenosis    Echo 8/13: EF 55-60%, Mild LVH, mild AS, mean gradient 12 mmHg,   . Benign neoplasm of stomach   . CAD (coronary artery disease)    a.  cath 2005: LAD 40%;  b. Myoview 3/12: EF 70%, no ischemia;  c. Myoview 9/13: EF 76%, no ischemia  . CAROTID ARTERY STENOSIS, WITHOUT INFARCTION 11/08/2009   Overview:  Overview:   Last Assessment & Plan:  She needs follow up carotid dopplers.  I will arrange. Overview:  2005-40 - 59% stenosis of RICA.  May have had f/u study since then but couldn't find results in cardiology notes     . CHF (congestive heart failure) ([place]) 06-13-12   episode after Cancer surgery  . Chronic headaches   . Chronic kidney disease    "my kidneys have been about to shut down" (07/01/2015)  . Chronic lower back pain   . Complication of anesthesia   . COPD (chronic obstructive pulmonary disease) ([place])   . Depression, major, in remission ([place]) 06/27/2011   Overview:  Also dx'd as PTSD. Followed by [facility]   . Diabetes mellitus without complication ([place])   . Dizziness and giddiness   . Esophageal reflux   . Family history of adverse reaction to anesthesia    sister had allergic reaction (unsure of reaction)  . Fibromyalgia   . Gastroparesis   . History of blood transfusion    "when I had my hemorrhoid OR"  . Macular degeneration   . Malaise and fatigue 11/08/2010   Overview:  Last Assessment & Plan:  She describes symptoms c/w sleep apnea.  I will send her for sleep medicine consultation.   . Mixed hyperlipidemia 11/18/2009   Qualifier: Diagnosis  of  By: Mack Guise    . Mixed incontinence 07/10/2015  . Obesity 03/18/2016  . Obstructive apnea 09/08/2011   NPSG-02/28/11- AHI 10.8/ hr, desat to 77% on room air, body weight 184 lbs   . Ovarian cancer ([place]) 2000; 2002  . Personal history of transient ischemic attack (TIA), and cerebral infarction without residual deficits 11/20/2013  . PONV (postoperative nausea and vomiting)     Past Surgical History:  Procedure Laterality Date  . ABDOMINAL HYSTERECTOMY    . ANTERIOR AND POSTERIOR VAGINAL REPAIR    . APPENDECTOMY    . BACK SURGERY    . BAND HEMORRHOIDECTOMY    . BREAST BIOPSY Left 1972  . CATARACT EXTRACTION W/ INTRAOCULAR LENS  IMPLANT, BILATERAL Bilateral 03/2013  . CHOLECYSTECTOMY OPEN    . COLONOSCOPY N/A 08/11/2013   Procedure: COLONOSCOPY;  Surgeon: Inda Castle, MD;  Location: [facility];  Service: Endoscopy;  Laterality: N/A;  . COLONOSCOPY N/A 12/08/2015   Procedure: COLONOSCOPY;  Surgeon: Manus Gunning, MD;  Location: [facility];  Service: Gastroenterology;  Laterality: N/A;  . DILATION AND CURETTAGE OF UTERUS    . ESOPHAGOGASTRODUODENOSCOPY N/A 08/01/2016   Procedure: ESOPHAGOGASTRODUODENOSCOPY (EGD);  Surgeon: Manus Gunning, MD;  Location: [facility];  Service: Gastroenterology;  Laterality: N/A;  . ESOPHAGOGASTRODUODENOSCOPY (  EGD) WITH ESOPHAGEAL DILATION     [facility] End[facility] 07/01/2015  . KNEE ARTHROSCOPY Left   . LUMBAR DISC SURGERY    . OOPHORECTOMY     1 ovary removed, then later last ovary removed  . TONSILLECTOMY  ~ 1958  . TUBAL LIGATION      Current Facility-Administered Medications  Medication Dose Route Frequency Provider Last Rate Last Dose  . 0.9 %  sodium chloride infusion   Intravenous Continuous Milus Banister, MD      . lactated ringers infusion   Intravenous Continuous Milus Banister, MD 10 mL/hr at 09/07/16 1026 1,000 mL at 09/07/16 1026    Allergies as of 08/09/2016 - Review Complete 08/01/2016  Allergen Reaction Noted   . Sunflower oil Anaphylaxis 05/16/2012  . Iodine Nausea And Vomiting 05/16/2012  . Metoclopramide hcl Other (See Comments) 10/02/2007  . Sulfamethoxazole Nausea And Vomiting 01/16/2007  . Reglan [metoclopramide]  04/03/2016  . Shellfish allergy Nausea And Vomiting 07/02/2015  . Sulfa antibiotics  04/03/2016  . Sulfamethoxazole-trimethoprim Nausea And Vomiting 03/08/2016  . Metformin and related Diarrhea 05/12/2016    Family History  Problem Relation Age of Onset  . Brain cancer Father     spine cancer  . Heart disease Mother   . Hypertension Mother   . Stomach cancer Paternal Grandfather   . Breast cancer Sister   . Kidney disease Sister   . Cancer Sister     breast  . Hypertension Sister   . Diabetes Brother   . Esophageal cancer Brother   . Diabetes Maternal Aunt   . Diabetes Paternal Aunt   . Alcohol abuse Son   . Hyperlipidemia Son   . Hypertension Son   . Cancer Sister     breast  . Colon cancer Neg Hx     Social History   Social History  . Marital status: Widowed    Spouse name: N/A  . Number of children: 3  . Years of education: N/A   Occupational History  . Disabled Unemployed    memory loss   Social History Main Topics  . Smoking status: Never Smoker  . Smokeless tobacco: Never Used  . Alcohol use No     Comment: 07/01/2015 "stopped  in before 2000"  . Drug use: No     Comment: 07/01/2015 "qut in 2000; tried pot, didn't enjoy it"  . Sexual activity: No   Other Topics Concern  . Not on file   Social History Narrative   ** Merged History Encounter **       Lives Caffeine use:     Physical Exam: BP 138/76   Pulse 94   Temp 98.1 F (36.7 C) (Oral)   Resp 20   Ht 5\' 4"  (1.626 m)   Wt 186 lb (84.4 kg)   LMP  (LMP Unknown)   SpO2 100%   BMI 31.93 kg/m  Constitutional: generally well-appearing Psychiatric: alert and oriented x3 Abdomen: soft, nontender, nondistended, no obvious ascites, no peritoneal signs, normal bowel sounds No  peripheral edema noted in lower extremities  Assessment and plan: 66 y.o. female with severe copd, abnormal esophagus  For EUS today   Owens Loffler, MD [facility] Gastroenterology 09/07/2016, 10:33 AM

## 2016-09-07 NOTE — Op Note (Signed)
[facility] Patient Name: [name] [name] Procedure Date: 09/07/2016 MRN: [id number] Attending MD: Milus Banister , MD Date of Birth: 1950/12/03 CSN: [id number] Age: 66 Admit Type: Outpatient Procedure:                Upper EUS Indications:              Esophageal submucosal lesion noted on recent EGD;                            intermittent solid and liquid dysphagia, no weight                            loss Providers:                Milus Banister, MD, Cleda Daub, RN, Corliss Parish, Technician Referring MD:             Jolly Mango, MD Medicines:                Monitored Anesthesia Care Complications:            No immediate complications. Estimated blood loss:                            None. Estimated Blood Loss:     Estimated blood loss: none. Procedure:                Pre-Anesthesia Assessment:                           - Prior to the procedure, a History and Physical                            was performed, and patient medications and                            allergies were reviewed. The patient's tolerance of                            previous anesthesia was also reviewed. The risks                            and benefits of the procedure and the sedation                            options and risks were discussed with the patient.                            All questions were answered, and informed consent                            was obtained. Prior Anticoagulants: The patient has                            taken no previous  anticoagulant or antiplatelet                            agents. ASA Grade Assessment: IV - A patient with                            severe systemic disease that is a constant threat                            to life. After reviewing the risks and benefits,                            the patient was deemed in satisfactory condition to                            undergo the procedure.                   After obtaining informed consent, the endoscope was                            passed under direct vision. Throughout the                            procedure, the patient's blood pressure, pulse, and                            oxygen saturations were monitored continuously. The                            [id number]) scope was introduced through                            the mouth, and advanced to the antrum of the                            stomach. The upper EUS was accomplished without                            difficulty. The patient tolerated the procedure                            well. Scope In: Scope Out: Findings:      Endoscopic Finding :      1. There was a subcentimeter smooth bulging inward of the proximal       esophagus (about 20cm from the incisors). The overlying mucosa was       normal.      2. The entire examined stomach was endoscopically normal.      Endosonographic Finding :      1. The submucosal bulging in the proximal esophagus correlated with an       adjacent vascular structure in the neck, appeared venous by Doppler.      2. No intramural masses, or tumors of the esophagus and no       paraesophageal adenopathy.      3. Limited view  of pancreas, liver, spleen, portal and splenic vessels       were all normal. Impression:               - The submucosal bulging in the proximal esophagus                            noted on recent EGD and seen again today is due to                            extrinsic compression from a normal nearby neck                            blood vessel. It is unlikely that this extrinsic                            compression is causing her swallowing symptoms. Moderate Sedation:      N/A- Per Anesthesia Care Recommendation:           - Discharge patient to home (ambulatory).                           - Chew your food well, eat slowly and take small                            bites. Procedure Code(s):         --- Professional ---                           [phone], 89, Esophagogastroduodenoscopy, flexible,                            transoral; with endoscopic ultrasound examination                            limited to the esophagus, stomach or duodenum, and                            adjacent structures Diagnosis Code(s):        --- Professional ---                           K22.8, Other specified diseases of esophagus CPT copyright 2016 American Medical Association. All rights reserved. The codes documented in this report are preliminary and upon coder review may  be revised to meet current compliance requirements. Milus Banister, MD 09/07/2016 11:02:36 AM This report has been signed electronically. Number of Addenda: 0

## 2016-09-08 ENCOUNTER — Encounter (HOSPITAL_COMMUNITY): Payer: Self-pay | Admitting: Gastroenterology

## 2016-09-12 ENCOUNTER — Other Ambulatory Visit: Payer: Self-pay

## 2016-09-12 ENCOUNTER — Telehealth: Payer: Self-pay | Admitting: Gastroenterology

## 2016-09-12 ENCOUNTER — Telehealth: Payer: Self-pay | Admitting: Internal Medicine

## 2016-09-12 MED ORDER — ONDANSETRON HCL 8 MG PO TABS: 8.0000 mg | ORAL_TABLET | Freq: Three times a day (TID) | ORAL | 1 refills | Status: DC | PRN

## 2016-09-12 NOTE — Telephone Encounter (Signed)
New Message:     Pt wants to know if she is going to have any upcoming tests soon? If not,she wants to cancel Friday appt.

## 2016-09-12 NOTE — Telephone Encounter (Signed)
Refill sent. Take q 8h prn for nausea #30 with 1 refill.

## 2016-09-12 NOTE — Telephone Encounter (Signed)
Contacted pt   [name] [name] is in [place]  They have a cardiology division. Office can call and we will forward records

## 2016-09-12 NOTE — Telephone Encounter (Signed)
Pt called to cancel the appointment she has with Dr Harrington Challenger on 09/15/16 pt is moving to [facility] in a week. Pt's appointment has been canceled. Pt would like to know if Dr. Harrington Challenger know a cardiologist in [place] that she can recommend.

## 2016-09-14 ENCOUNTER — Other Ambulatory Visit: Payer: Self-pay | Admitting: Family Medicine

## 2016-09-14 DIAGNOSIS — Z794 Long term (current) use of insulin: Principal | ICD-10-CM

## 2016-09-14 DIAGNOSIS — E0800 Diabetes mellitus due to underlying condition with hyperosmolarity without nonketotic hyperglycemic-hyperosmolar coma (NKHHC): Secondary | ICD-10-CM

## 2016-09-15 ENCOUNTER — Telehealth: Payer: Self-pay | Admitting: Family Medicine

## 2016-09-15 ENCOUNTER — Other Ambulatory Visit: Payer: Self-pay

## 2016-09-15 ENCOUNTER — Telehealth: Payer: Self-pay | Admitting: Gastroenterology

## 2016-09-15 ENCOUNTER — Telehealth: Payer: Self-pay

## 2016-09-15 ENCOUNTER — Ambulatory Visit: Payer: Medicare Other | Admitting: Internal Medicine

## 2016-09-15 DIAGNOSIS — Z794 Long term (current) use of insulin: Secondary | ICD-10-CM

## 2016-09-15 DIAGNOSIS — E088 Diabetes mellitus due to underlying condition with unspecified complications: Secondary | ICD-10-CM

## 2016-09-15 MED ORDER — ONDANSETRON HCL 8 MG PO TABS: 8.0000 mg | ORAL_TABLET | Freq: Three times a day (TID) | ORAL | 1 refills | Status: DC | PRN

## 2016-09-15 MED ORDER — GLUCOSE BLOOD VI STRP: ORAL_STRIP | 3 refills | Status: DC

## 2016-09-15 NOTE — Telephone Encounter (Signed)
Looks like Zofran was sent on the 16th of Jan. Rx resent to make sure the [facility] on [address] receives it.

## 2016-09-15 NOTE — Telephone Encounter (Signed)
Pt called saying that the wrong test strips were sent to pharmacy and her insurance would not cover them. Pt stated that she needed one touch ultra 2 test strips because she was out of them. Rx sent to pharmacy per Dr. Raliegh Scarlet. Pt is aware and agreeable.

## 2016-09-15 NOTE — Telephone Encounter (Signed)
Patient called saying she is out of test strips and insurance wont cover the ones we have been sending to the pharmacy for her anymore. She didn't know which strips to have ordered. She currently uses the One Touch Ultra II. Advised patient to contact her insurance company and see which test strips they prefer to use.

## 2016-09-18 NOTE — Telephone Encounter (Signed)
Done by Bobetta Lime, CMA/RT

## 2016-09-20 ENCOUNTER — Telehealth: Payer: Self-pay | Admitting: Gastroenterology

## 2016-09-21 ENCOUNTER — Other Ambulatory Visit: Payer: Self-pay

## 2016-09-21 MED ORDER — ONDANSETRON HCL 8 MG PO TABS: 8.0000 mg | ORAL_TABLET | Freq: Three times a day (TID) | ORAL | 1 refills | Status: DC | PRN

## 2016-09-21 NOTE — Telephone Encounter (Signed)
Rx was sent on the 19th electronically. Will resend to make sure they receive it.

## 2016-10-01 ENCOUNTER — Other Ambulatory Visit: Payer: Self-pay | Admitting: Family Medicine

## 2016-10-02 ENCOUNTER — Telehealth: Payer: Self-pay

## 2016-10-02 NOTE — Telephone Encounter (Signed)
Pt informed she must have OV prior to any further refills.  Pt expressed understanding and is agreeable.  Pt was transferred to front desk to schedule OV.  Charyl Bigger, CMA

## 2016-10-05 ENCOUNTER — Other Ambulatory Visit: Payer: Self-pay | Admitting: Family Medicine

## 2016-10-10 ENCOUNTER — Ambulatory Visit (INDEPENDENT_AMBULATORY_CARE_PROVIDER_SITE_OTHER): Payer: Medicare Other | Admitting: Adult Health

## 2016-10-10 ENCOUNTER — Encounter: Payer: Self-pay | Admitting: Adult Health

## 2016-10-10 ENCOUNTER — Ambulatory Visit: Payer: Medicare Other | Admitting: Sports Medicine

## 2016-10-10 VITALS — BP 107/73 | HR 98 | Ht 64.0 in | Wt 180.0 lb

## 2016-10-10 DIAGNOSIS — Z794 Long term (current) use of insulin: Secondary | ICD-10-CM

## 2016-10-10 DIAGNOSIS — G4719 Other hypersomnia: Secondary | ICD-10-CM

## 2016-10-10 DIAGNOSIS — E0865 Diabetes mellitus due to underlying condition with hyperglycemia: Secondary | ICD-10-CM | POA: Diagnosis not present

## 2016-10-10 LAB — POCT GLYCOSYLATED HEMOGLOBIN (HGB A1C): Hemoglobin A1C: 7.4

## 2016-10-10 NOTE — Patient Instructions (Signed)
Hypertension Hypertension is another name for high blood pressure. High blood pressure forces your heart to work harder to pump blood. A blood pressure reading has two numbers, which includes a higher number over a lower number (example: 110/72). Follow these instructions at home:  Have your blood pressure rechecked by your doctor.  Only take medicine as told by your doctor. Follow the directions carefully. The medicine does not work as well if you skip doses. Skipping doses also puts you at risk for problems.  Do not smoke.  Monitor your blood pressure at home as told by your doctor. Contact a doctor if:  You think you are having a reaction to the medicine you are taking.  You have repeat headaches or feel dizzy.  You have puffiness (swelling) in your ankles.  You have trouble with your vision. Get help right away if:  You get a very bad headache and are confused.  You feel weak, numb, or faint.  You get chest or belly (abdominal) pain.  You throw up (vomit).  You cannot breathe very well. This information is not intended to replace advice given to you by your health care provider. Make sure you discuss any questions you have with your health care provider. Document Released: 01/31/2008 Document Revised: 01/20/2016 Document Reviewed: 06/06/2013 Elsevier Interactive Patient Education  2017 [place] for Diabetes Mellitus, Adult Carbohydrate counting is a method for keeping track of how many carbohydrates you eat. Eating carbohydrates naturally increases the amount of sugar (glucose) in the blood. Counting how many carbohydrates you eat helps keep your blood glucose within normal limits, which helps you manage your diabetes (diabetes mellitus). It is important to know how many carbohydrates you can safely have in each meal. This is different for every person. A diet and nutrition specialist (registered dietitian) can help you make a meal plan and  calculate how many carbohydrates you should have at each meal and snack. Carbohydrates are found in the following foods:  Grains, such as breads and cereals.  Dried beans and soy products.  Starchy vegetables, such as potatoes, peas, and corn.  Fruit and fruit juices.  Milk and yogurt.  Sweets and snack foods, such as cake, cookies, candy, chips, and soft drinks. How do I count carbohydrates? There are two ways to count carbohydrates in food. You can use either of the methods or a combination of both. Reading "Nutrition Facts" on packaged food  The "Nutrition Facts" list is included on the labels of almost all packaged foods and beverages in the [place] It includes:  The serving size.  Information about nutrients in each serving, including the grams (g) of carbohydrate per serving. To use the "Nutrition Facts":  Decide how many servings you will have.  Multiply the number of servings by the number of carbohydrates per serving.  The resulting number is the total amount of carbohydrates that you will be having. Learning standard serving sizes of other foods  When you eat foods containing carbohydrates that are not packaged or do not include "Nutrition Facts" on the label, you need to measure the servings in order to count the amount of carbohydrates:  Measure the foods that you will eat with a food scale or measuring cup, if needed.  Decide how many standard-size servings you will eat.  Multiply the number of servings by 15. Most carbohydrate-rich foods have about 15 g of carbohydrates per serving.  For example, if you eat 8 oz (170 g) of strawberries, you will have  eaten 2 servings and 30 g of carbohydrates (2 servings x 15 g = 30 g).  For foods that have more than one food mixed, such as soups and casseroles, you must count the carbohydrates in each food that is included. The following list contains standard serving sizes of common carbohydrate-rich foods. Each of these servings  has about 15 g of carbohydrates:   hamburger bun or  English muffin.   oz (15 mL) syrup.   oz (14 g) jelly.  1 slice of bread.  1 six-inch tortilla.  3 oz (85 g) cooked rice or pasta.  4 oz (113 g) cooked dried beans.  4 oz (113 g) starchy vegetable, such as peas, corn, or potatoes.  4 oz (113 g) hot cereal.  4 oz (113 g) mashed potatoes or  of a large baked potato.  4 oz (113 g) canned or frozen fruit.  4 oz (120 mL) fruit juice.  4-6 crackers.  6 chicken nuggets.  6 oz (170 g) unsweetened dry cereal.  6 oz (170 g) plain fat-free yogurt or yogurt sweetened with artificial sweeteners.  8 oz (240 mL) milk.  8 oz (170 g) fresh fruit or one small piece of fruit.  24 oz (680 g) popped popcorn. Example of carbohydrate counting Sample meal  3 oz (85 g) chicken breast.  6 oz (170 g) brown rice.  4 oz (113 g) corn.  8 oz (240 mL) milk.  8 oz (170 g) strawberries with sugar-free whipped topping. Carbohydrate calculation 1. Identify the foods that contain carbohydrates:  Rice.  Corn.  Milk.  Strawberries. 2. Calculate how many servings you have of each food:  2 servings rice.  1 serving corn.  1 serving milk.  1 serving strawberries. 3. Multiply each number of servings by 15 g:  2 servings rice x 15 g = 30 g.  1 serving corn x 15 g = 15 g.  1 serving milk x 15 g = 15 g.  1 serving strawberries x 15 g = 15 g. 4. Add together all of the amounts to find the total grams of carbohydrates eaten:  30 g + 15 g + 15 g + 15 g = 75 g of carbohydrates total. This information is not intended to replace advice given to you by your health care provider. Make sure you discuss any questions you have with your health care provider. Document Released: 08/14/2005 Document Revised: 03/03/2016 Document Reviewed: 01/26/2016 Elsevier Interactive Patient Education  2017 [employer]. Blood Glucose Monitoring, Adult Monitoring your blood sugar (glucose) helps  you manage your diabetes. It also helps you and your health care provider determine how well your diabetes management plan is working. Blood glucose monitoring involves checking your blood glucose as often as directed, and keeping a record (log) of your results over time. Why should I monitor my blood glucose? Checking your blood glucose regularly can:  Help you understand how food, exercise, illnesses, and medicines affect your blood glucose.  Let you know what your blood glucose is at any time. You can quickly tell if you are having low blood glucose (hypoglycemia) or high blood glucose (hyperglycemia).  Help you and your health care provider adjust your medicines as needed. When should I check my blood glucose? Follow instructions from your health care provider about how often to check your blood glucose. This may depend on:  The type of diabetes you have.  How well-controlled your diabetes is.  Medicines you are taking. If you have type  1 diabetes:  Check your blood glucose at least 2 times a day.  Also check your blood glucose:  Before every insulin injection.  Before and after exercise.  Between meals.  2 hours after a meal.  Occasionally between 2:00 a.m. and 3:00 a.m., as directed.  Before potentially dangerous tasks, like driving or using heavy machinery.  At bedtime.  You may need to check your blood glucose more often, up to 6-10 times a day:  If you use an insulin pump.  If you need multiple daily injections (MDI).  If your diabetes is not well-controlled.  If you are ill.  If you have a history of severe hypoglycemia.  If you have a history of not knowing when your blood glucose is getting low (hypoglycemia unawareness). If you have type 2 diabetes:  If you take insulin or other diabetes medicines, check your blood glucose at least 2 times a day.  If you are on intensive insulin therapy, check your blood glucose at least 4 times a day. Occasionally,  you may also need to check between 2:00 a.m. and 3:00 a.m., as directed.  Also check your blood glucose:  Before and after exercise.  Before potentially dangerous tasks, like driving or using heavy machinery.  You may need to check your blood glucose more often if:  Your medicine is being adjusted.  Your diabetes is not well-controlled.  You are ill. What is a blood glucose log?  A blood glucose log is a record of your blood glucose readings. It helps you and your health care provider:  Look for patterns in your blood glucose over time.  Adjust your diabetes management plan as needed.  Every time you check your blood glucose, write down your result and notes about things that may be affecting your blood glucose, such as your diet and exercise for the day.  Most glucose meters store a record of glucose readings in the meter. Some meters allow you to download your records to a computer. How do I check my blood glucose? Follow these steps to get accurate readings of your blood glucose: Supplies needed   Blood glucose meter.  Test strips for your meter. Each meter has its own strips. You must use the strips that come with your meter.  A needle to prick your finger (lancet). Do not use lancets more than once.  A device that holds the lancet (lancing device).  A journal or log book to write down your results. Procedure  Wash your hands with soap and water.  Prick the side of your finger (not the tip) with the lancet. Use a different finger each time.  Gently rub the finger until a small drop of blood appears.  Follow instructions that come with your meter for inserting the test strip, applying blood to the strip, and using your blood glucose meter.  Write down your result and any notes. Alternative testing sites  Some meters allow you to use areas of your body other than your finger (alternative sites) to test your blood.  If you think you may have hypoglycemia, or if  you have hypoglycemia unawareness, do not use alternative sites. Use your finger instead.  Alternative sites may not be as accurate as the fingers, because blood flow is slower in these areas. This means that the result you get may be delayed, and it may be different from the result that you would get from your finger.  The most common alternative sites are:  Forearm.  Thigh.  Palm of the hand. Additional tips  Always keep your supplies with you.  If you have questions or need help, all blood glucose meters have a 24-hour "hotline" number that you can call. You may also contact your health care provider.  After you use a few boxes of test strips, adjust (calibrate) your blood glucose meter by following instructions that came with your meter. This information is not intended to replace advice given to you by your health care provider. Make sure you discuss any questions you have with your health care provider. Document Released: 08/17/2003 Document Revised: 03/03/2016 Document Reviewed: 01/24/2016 Elsevier Interactive Patient Education  2017 [employer].   Continue medications as directed. Need to eliminate Cool-Aid with sugar completely.  Drink water or Cool-Aid with sugar substitute sparingly.  Also need to reduce daily carbohydrate intake, hand out provided. Continue walking dogs several times daily, continue regular movement. Please make appt with psychiatrist to discuss nightly dosage of Trazodone. Since you are moving to [place] in the next month, please establish with new PCP immediately.

## 2016-10-10 NOTE — Assessment & Plan Note (Signed)
Please call psychiatrist to discuss HS Trazodone dosage.

## 2016-10-10 NOTE — Progress Notes (Signed)
Subjective:    Patient ID: [name] [name], female    DOB: 1951/08/17, 66 y.o.   MRN: [id number]  HPI Home Blood Pressure Readings:   SBP 110s-150s, DBP: 70-100s. Fasting am BS: 150-220s, evening post-prandial BS : 200-325s. She reports medication compliance, and denies SE. Daily nutrition: Breakfast- 2 meat sandwiches Lunch-some sort of sausage/protein Dinner-2 meat sandwiches or meat and grilled vegetables. She estimates drinking 1-2 cups of water and then drinking Cool-Aid with real sugar throughout the remainder of the day. She reports "walking the 4 dogs every 3 hours so they won't mess on the floor".   Patient Care Team    Relationship Specialty Notifications Start End  [name], DO PCP - General Family Medicine  07/28/16   [name], DO  Family Medicine  03/10/16    Comment: Teddy Spike, [place] Physician Obstetrics and Gynecology  04/03/16   Landis Martins, DPM Consulting Physician Podiatry  04/03/16   [facility] Consulting Physician Orthopedic Surgery  04/03/16   Fay Records, MD Consulting Physician Cardiology  04/03/16   Melvenia Beam, MD Consulting Physician Neurology  04/03/16   Inda Castle, MD Consulting Physician Gastroenterology  05/10/16    Comment: Hepatitis C  Manus Gunning, MD Consulting Physician Gastroenterology  05/12/16   [facility] Consulting Physician Urology  05/12/16   Collene Gobble, MD Consulting Physician Pulmonary Disease  07/01/16     Patient Active Problem List   Diagnosis Date Noted  . Submucosal lesion of esophagus   . Microalbuminuric diabetic nephropathy 06/28/2016  . Diabetes mellitus due to underlying condition, controlled, with long-term current use of insulin ([place]) 05/10/2016  . Chronic lower back pain 05/10/2016  . Generalized OA 05/10/2016  . Obesity 03/18/2016  . COPD (chronic obstructive pulmonary disease) ([place]) 03/18/2016  . Chronic headaches ( migraineous)   03/18/2016  . CAD (coronary artery disease) 03/18/2016  . Aortic stenosis 03/18/2016  . History of genital warts 03/18/2016  . Chronic dryness of both eyes 03/18/2016  . Chronic kidney disease 03/08/2016  . Int Hemorrhoids: chronic blood in stool   . New onset of headaches after age 68 09/15/2015  . Snoring 09/15/2015  . Daytime hypersomnolence- OSA 09/15/2015  . 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 Bad memory 07/10/2015  . Mixed incontinence 07/10/2015  . Frailty 07/10/2015  . Altered mental state 07/01/2015  . Chronic hypoxic respiratory failure:  home O2 (Drs [place] Carbo and Young) 08/04/2014  . Allergic rhinitis 01/22/2014  . Personal history of transient ischemic attack (TIA), and cerebral infarction without residual deficits 11/20/2013  . h/o Anemia 11/19/2013  . Problem situation relating to social and personal history 09/18/2013  . Polypharmacy 07/02/2012  . Other long term (current) drug therapy 07/02/2012  . h/o Acute pulmonary embolism ([place]) 06/21/2012  . CHF (congestive heart failure) ([place]) 06/13/2012  . Fatty liver disease, nonalcoholic [id number]  . Obstructive Sleep Apnea 09/08/2011  . Back pain, chronic 06/27/2011  . Depression, major, in remission ([place]) 06/27/2011  . Absence of bladder continence 06/27/2011  . Essential hypertension 06/27/2011  . Malaise and fatigue: Frailty, chronic deconditioning 11/08/2010  . Mixed hyperlipidemia 11/18/2009  . CAROTID ARTERY STENOSIS, WITHOUT INFARCTION 11/08/2009  . HYPERURICEMIA 07/29/2009  .  chronic DIZZINESS 11/18/2008  . h/o freq CANDIDIASIS, ORAL 08/25/2008  . Chronic Diarrhea 07/27/2008  . Polyuria 07/27/2008  . Chronic hepatitis C virus infection ([place]) 10/02/2007  . Esophageal reflux 10/02/2007  . Gastroparesis 10/02/2007  . Dysphagia  10/02/2007     Past Medical History:  Diagnosis Date  . Altered mental state 07/01/2015  . Anemia   . Anxiety   . Aortic stenosis    Echo 8/13: EF 55-60%, Mild LVH, mild  AS, mean gradient 12 mmHg,   . Benign neoplasm of stomach   . CAD (coronary artery disease)    a.  cath 2005: LAD 40%;  b. Myoview 3/12: EF 70%, no ischemia;  c. Myoview 9/13: EF 76%, no ischemia  . CAROTID ARTERY STENOSIS, WITHOUT INFARCTION 11/08/2009   Overview:  Overview:   Last Assessment & Plan:  She needs follow up carotid dopplers.  I will arrange. Overview:  2005-40 - 59% stenosis of RICA.  May have had f/u study since then but couldn't find results in cardiology notes     . CHF (congestive heart failure) ([place]) 06-13-12   episode after Cancer surgery  . Chronic headaches   . Chronic kidney disease    "my kidneys have been about to shut down" (07/01/2015)  . Chronic lower back pain   . Complication of anesthesia   . COPD (chronic obstructive pulmonary disease) ([place])   . Depression, major, in remission ([place]) 06/27/2011   Overview:  Also dx'd as PTSD. Followed by [facility] ([facility]   . Diabetes mellitus without complication ([place])   . Dizziness and giddiness   . Esophageal reflux   . Family history of adverse reaction to anesthesia    sister had allergic reaction (unsure of reaction)  . Fibromyalgia   . Gastroparesis   . History of blood transfusion    "when I had my hemorrhoid OR"  . Macular degeneration   . Malaise and fatigue 11/08/2010   Overview:  Last Assessment & Plan:  She describes symptoms c/w sleep apnea.  I will send her for sleep medicine consultation.   . Mixed hyperlipidemia 11/18/2009   Qualifier: Diagnosis of  By: Mack Guise    . Mixed incontinence 07/10/2015  . Obesity 03/18/2016  . Obstructive apnea 09/08/2011   NPSG-02/28/11- AHI 10.8/ hr, desat to 77% on room air, body weight 184 lbs   . Ovarian cancer ([place]) 2000; 2002  . Personal history of transient ischemic attack (TIA), and cerebral infarction without residual deficits 11/20/2013  . PONV (postoperative nausea and vomiting)      Past Surgical History:  Procedure  Laterality Date  . ABDOMINAL HYSTERECTOMY    . ANTERIOR AND POSTERIOR VAGINAL REPAIR    . APPENDECTOMY    . BACK SURGERY    . BAND HEMORRHOIDECTOMY    . BREAST BIOPSY Left 1972  . CATARACT EXTRACTION W/ INTRAOCULAR LENS  IMPLANT, BILATERAL Bilateral 03/2013  . CHOLECYSTECTOMY OPEN    . COLONOSCOPY N/A 08/11/2013   Procedure: COLONOSCOPY;  Surgeon: Inda Castle, MD;  Location: [facility] END[facility]COPY;  Service: Endoscopy;  Laterality: N/A;  . COLONOSCOPY N/A 12/08/2015   Procedure: COLONOSCOPY;  Surgeon: Manus Gunning, MD;  Location: [facility] ENDOSCOPY;  Service: Gastroenterology;  Laterality: N/A;  . DILATION AND CURETTAGE OF UTERUS    . ESOPHAGOGASTRODUODENOSCOPY N/A 08/01/2016   Procedure: ESOPHAGOGASTRODUODENOSCOPY (EGD);  Surgeon: Manus Gunning, MD;  Location: [facility];  Service: Gastroenterology;  Laterality: N/A;  . ESOPHAGOGASTRODUODENOSCOPY (EGD) WITH ESOPHAGEAL DILATION     Archie Endo 07/01/2015  . EUS N/A 09/07/2016   Procedure: UPPER ENDOSCOPIC ULTRASOUND (EUS) RADIAL;  Surgeon: Milus Banister, MD;  Location: [facility];  Service: Endoscopy;  Laterality: N/A;  . KNEE ARTHROSCOPY Left   .  LUMBAR DISC SURGERY    . OOPHORECTOMY     1 ovary removed, then later last ovary removed  . TONSILLECTOMY  ~ 1958  . TUBAL LIGATION       Family History  Problem Relation Age of Onset  . Brain cancer Father     spine cancer  . Heart disease Mother   . Hypertension Mother   . Stomach cancer Paternal Grandfather   . Breast cancer Sister   . Kidney disease Sister   . Cancer Sister     breast  . Hypertension Sister   . Diabetes Brother   . Esophageal cancer Brother   . Diabetes Maternal Aunt   . Diabetes Paternal Aunt   . Alcohol abuse Son   . Hyperlipidemia Son   . Hypertension Son   . Cancer Sister     breast  . Colon cancer Neg Hx      History  Drug Use No    Comment: 07/01/2015 "qut in 2000; tried pot, didn't enjoy it"     History  Alcohol Use No    Comment:  07/01/2015 "stopped  in before 2000"     History  Smoking Status  . Never Smoker  Smokeless Tobacco  . Never Used     Outpatient Encounter Prescriptions as of 10/10/2016  Medication Sig  . ARIPiprazole (ABILIFY) 5 MG tablet Take 5 mg by mouth daily.   [name] aspirin EC 81 MG tablet Take 81 mg by mouth daily.  [name] azelastine (ASTELIN) 0.1 % nasal spray 2 sprays each nostril four times daily for 3-5 days then twice a day (Patient taking differently: Place 2 sprays into both nostrils daily. )  . B-D UF III MINI PEN NEEDLES 31G X 5 MM MISC USE ONCE DAILY WITH LANTUS AND VICTOZA  . bisoprolol (ZEBETA) 5 MG tablet TAKE ONE TABLET BY MOUTH ONCE DAILY  . colestipol (COLESTID) 1 g tablet Take 1 g by mouth daily.   . cycloSPORINE (RESTASIS) 0.05 % ophthalmic emulsion Place 1 drop into both eyes 2 (two) times daily.  . diclofenac (VOLTAREN) 75 MG EC tablet TAKE ONE TABLET BY MOUTH TWICE DAILY  . DULoxetine (CYMBALTA) 30 MG capsule Take 1 capsule (30 mg total) by mouth daily.  [name] gabapentin (NEURONTIN) 300 MG capsule Take 1 capsule (300 mg total) by mouth as directed. 2 capsules at breakfast, one capsule at lunch and 2 capsules after dinner (Patient taking differently: Take 300-600 mg by mouth 3 (three) times daily. 2 capsules at breakfast, one capsule at lunch and 2 capsules after dinner)  . gemfibrozil (LOPID) 600 MG tablet Take 600 mg by mouth 2 (two) times daily before a meal.  . glucose blood test strip Check fasting blood sugar every morning and then 2 hours after largest meal.  . hydrOXYzine (ATARAX/VISTARIL) 25 MG tablet One capsule every morning as needed for anxiety Take 25 mg by mouth. One capsule every morning as needed for anxiety (Patient taking differently: Take 25 mg by mouth daily. )  . Insulin Glargine (LANTUS SOLOSTAR) 100 UNIT/ML Solostar Pen Inject 20 Units into the skin daily at 10 pm.  . INVOKANA 100 MG TABS tablet TAKE ONE TABLET BY MOUTH ONCE DAILY BEFORE BREAKFAST  . Liraglutide 18  MG/3ML SOPN Inject 0.1 mLs (0.6 mg total) into the skin every morning. Check FBS daily and write it down  . lisinopril (PRINIVIL,ZESTRIL) 40 MG tablet Take 1 tablet (40 mg total) by mouth daily.  . ondansetron (ZOFRAN) 8 MG tablet  Take 1 tablet (8 mg total) by mouth every 8 (eight) hours as needed for nausea or vomiting.  . OXYGEN-HELIUM IN Place 2-3 L into the nose continuous.   . pantoprazole (PROTONIX) 40 MG tablet Take 1 tablet (40 mg total) by mouth daily.  . sitaGLIPtin (JANUVIA) 100 MG tablet Take 1 tablet (100 mg total) by mouth daily.  . solifenacin (VESICARE) 5 MG tablet Take 5 mg by mouth daily.   . traZODone (DESYREL) 100 MG tablet Take 100 mg by mouth at bedtime.  . Vitamin D, Ergocalciferol, (DRISDOL) 50000 units CAPS capsule Take 1 capsule (50,000 Units total) by mouth every 7 (seven) days. (Patient taking differently: Take 50,000 Units by mouth every Saturday. )  . amLODipine (NORVASC) 5 MG tablet Take 1 tablet (5 mg total) by mouth daily.   No facility-administered encounter medications on file as of 10/10/2016.     Allergies: Sunflower oil; Iodine; Metoclopramide hcl; Shellfish allergy; Sulfa antibiotics; and Metformin and related  Body mass index is 30.9 kg/m.  Blood pressure 107/73, pulse 98, height 5\' 4"  (1.626 m), weight 180 lb (81.6 kg).    Review of Systems  Constitutional: Negative for activity change, appetite change, chills, diaphoresis, fatigue, fever and unexpected weight change.  HENT: Negative for congestion.   Eyes: Negative for visual disturbance.  Respiratory: Negative for cough and shortness of breath.   Cardiovascular: Negative for chest pain, palpitations and leg swelling.  Gastrointestinal: Negative for abdominal distention, constipation, diarrhea, nausea and vomiting.  Endocrine: Negative for cold intolerance, heat intolerance, polydipsia, polyphagia and polyuria.  Genitourinary: Negative for difficulty urinating and flank pain.  Musculoskeletal:  Positive for back pain and myalgias. Negative for gait problem and joint swelling.  Skin: Negative for color change, pallor, rash and wound.  Neurological: Positive for tremors. Negative for dizziness and weakness.       Reports upper extremity tremor that began over a year ago.  Hematological: Does not bruise/bleed easily.  Psychiatric/Behavioral: Positive for sleep disturbance. Negative for agitation, behavioral problems, self-injury and suicidal ideas. The patient is not nervous/anxious and is not hyperactive.        Trazodone recently increase from 50mg  to 100mg  Q HS.       Objective:   Physical Exam  Constitutional: She is oriented to person, place, and time. She appears well-developed and well-nourished. No distress.  HENT:  Head: Normocephalic and atraumatic.  Eyes: Conjunctivae and EOM are normal. Pupils are equal, round, and reactive to light.  Neck: Normal range of motion. Neck supple.  Cardiovascular: Normal rate, regular rhythm, normal heart sounds and intact distal pulses.   No murmur heard. Pulmonary/Chest: Effort normal and breath sounds normal. She has no wheezes. She has no rales. She exhibits no tenderness.  Musculoskeletal: Normal range of motion.  Neurological: She is alert and oriented to person, place, and time. She has normal reflexes. No cranial nerve deficit. Coordination normal.  Skin: Skin is warm and dry. No rash noted. No erythema. No pallor.  Psychiatric: She has a normal mood and affect. Her behavior is normal. Judgment and thought content normal.  Nursing note and vitals reviewed.         Assessment & Plan:   1. Diabetes mellitus due to underlying condition, controlled, with hyperglycemia, with long-term current use of insulin ([place])   2. Daytime hypersomnolence- OSA     Diabetes mellitus due to underlying condition, controlled, with long-term current use of insulin ([facility]) Continue all diabetic medications as directed. Need to dramatically reduce  daily CHO/sugar intake-discussed this for > 15 mins. Healthy eating hand-outs provided. Continue waling the dogs several times daily. She is moving to [place] in the next few weeks.  Please establish with local PCP ASAP.   Daytime hypersomnolence- OSA Please call psychiatrist to discuss HS Trazodone dosage.    FOLLOW-UP:  Return in about 3 months (around 01/07/2017).

## 2016-10-10 NOTE — Assessment & Plan Note (Signed)
Continue all diabetic medications as directed. Need to dramatically reduce daily CHO/sugar intake-discussed this for > 15 mins. Healthy eating hand-outs provided. Continue waling the dogs several times daily. She is moving to [place] in the next few weeks.  Please establish with local PCP ASAP.

## 2016-10-11 ENCOUNTER — Emergency Department (HOSPITAL_COMMUNITY): Payer: Medicare Other

## 2016-10-11 ENCOUNTER — Encounter (HOSPITAL_COMMUNITY): Payer: Self-pay | Admitting: *Deleted

## 2016-10-11 ENCOUNTER — Telehealth: Payer: Self-pay | Admitting: Internal Medicine

## 2016-10-11 ENCOUNTER — Observation Stay (HOSPITAL_COMMUNITY)
Admission: EM | Admit: 2016-10-11 | Discharge: 2016-10-12 | Disposition: A | Discharge status: Home / Self Care | Payer: Medicare Other | Attending: Internal Medicine | Admitting: Internal Medicine

## 2016-10-11 DIAGNOSIS — R0789 Other chest pain: Secondary | ICD-10-CM | POA: Diagnosis not present

## 2016-10-11 DIAGNOSIS — I509 Heart failure, unspecified: Secondary | ICD-10-CM | POA: Diagnosis not present

## 2016-10-11 DIAGNOSIS — Z1273 Encounter for screening for malignant neoplasm of ovary: Secondary | ICD-10-CM | POA: Insufficient documentation

## 2016-10-11 DIAGNOSIS — I130 Hypertensive heart and chronic kidney disease with heart failure and stage 1 through stage 4 chronic kidney disease, or unspecified chronic kidney disease: Secondary | ICD-10-CM | POA: Diagnosis not present

## 2016-10-11 DIAGNOSIS — I2510 Atherosclerotic heart disease of native coronary artery without angina pectoris: Secondary | ICD-10-CM | POA: Insufficient documentation

## 2016-10-11 DIAGNOSIS — Z794 Long term (current) use of insulin: Secondary | ICD-10-CM

## 2016-10-11 DIAGNOSIS — R079 Chest pain, unspecified: Secondary | ICD-10-CM

## 2016-10-11 DIAGNOSIS — Z8673 Personal history of transient ischemic attack (TIA), and cerebral infarction without residual deficits: Secondary | ICD-10-CM | POA: Diagnosis not present

## 2016-10-11 DIAGNOSIS — E1122 Type 2 diabetes mellitus with diabetic chronic kidney disease: Secondary | ICD-10-CM | POA: Insufficient documentation

## 2016-10-11 DIAGNOSIS — J449 Chronic obstructive pulmonary disease, unspecified: Secondary | ICD-10-CM | POA: Insufficient documentation

## 2016-10-11 DIAGNOSIS — G4733 Obstructive sleep apnea (adult) (pediatric): Secondary | ICD-10-CM | POA: Diagnosis present

## 2016-10-11 DIAGNOSIS — E089 Diabetes mellitus due to underlying condition without complications: Secondary | ICD-10-CM

## 2016-10-11 DIAGNOSIS — Z7982 Long term (current) use of aspirin: Secondary | ICD-10-CM | POA: Diagnosis not present

## 2016-10-11 DIAGNOSIS — R0602 Shortness of breath: Secondary | ICD-10-CM | POA: Diagnosis not present

## 2016-10-11 DIAGNOSIS — I4510 Unspecified right bundle-branch block: Principal | ICD-10-CM | POA: Insufficient documentation

## 2016-10-11 DIAGNOSIS — N189 Chronic kidney disease, unspecified: Secondary | ICD-10-CM | POA: Diagnosis not present

## 2016-10-11 LAB — CBC
HCT: 33.4 % — ABNORMAL LOW (ref 36.0–46.0)
Hemoglobin: 10.6 g/dL — ABNORMAL LOW (ref 12.0–15.0)
MCH: 27.7 pg (ref 26.0–34.0)
MCHC: 31.7 g/dL (ref 30.0–36.0)
MCV: 87.2 fL (ref 78.0–100.0)
PLATELETS: 204 10*3/uL (ref 150–400)
RBC: 3.83 MIL/uL — AB (ref 3.87–5.11)
RDW: 14.2 % (ref 11.5–15.5)
WBC: 6.8 10*3/uL (ref 4.0–10.5)

## 2016-10-11 LAB — BASIC METABOLIC PANEL
ANION GAP: 10 (ref 5–15)
BUN: 15 mg/dL (ref 6–20)
CALCIUM: 9.1 mg/dL (ref 8.9–10.3)
CO2: 21 mmol/L — ABNORMAL LOW (ref 22–32)
CREATININE: 1.02 mg/dL — AB (ref 0.44–1.00)
Chloride: 109 mmol/L (ref 101–111)
GFR, EST NON AFRICAN AMERICAN: 57 mL/min — AB (ref >60)
Glucose, Bld: 225 mg/dL — ABNORMAL HIGH (ref 65–99)
Potassium: 4 mmol/L (ref 3.5–5.1)
SODIUM: 140 mmol/L (ref 135–145)

## 2016-10-11 LAB — CBG MONITORING, ED: GLUCOSE-CAPILLARY: 159 mg/dL — AB (ref 65–99)

## 2016-10-11 LAB — I-STAT TROPONIN, ED
TROPONIN I, POC: 0 ng/mL (ref 0.00–0.08)
Troponin i, poc: 0.01 ng/mL (ref 0.00–0.08)

## 2016-10-11 MED ORDER — ONDANSETRON 4 MG PO TBDP
4.0000 mg | ORAL_TABLET | Freq: Once | ORAL | Status: AC
  Administered 2016-10-11: 4 mg via ORAL
  Filled 2016-10-11: qty 1

## 2016-10-11 MED ORDER — NITROGLYCERIN 0.4 MG SL SUBL
0.4000 mg | SUBLINGUAL_TABLET | SUBLINGUAL | Status: DC | PRN
  Administered 2016-10-11: 0.4 mg via SUBLINGUAL
  Filled 2016-10-11: qty 1

## 2016-10-11 NOTE — ED Triage Notes (Signed)
Pt arrived by [facility] for chest pain at 1530, pain substernal and radiated down right arm. Described pain as a pressure. Has cardiac hx. Has copd, chronic sob. Received 1 nitro and 324mg  ASA pta, is pain free on arrival.

## 2016-10-11 NOTE — ED Provider Notes (Signed)
[facility] DEPT Provider Note   CSN: [id number] Arrival date & time: 10/11/16  1758     History   Chief Complaint Chief Complaint  Patient presents with  . Chest Pain    HPI [name] [name] is a 66 y.o. female.  HPI This is a 66 year old lady history of congestive heart failure, aortic stenosis, coronary artery disease presents today complaining of right-sided chest pain that began at approximately 2:00 this afternoon. She did not take anything at home for it. She was given 1 nitroglycerin and aspirin by EMS and pain has completely resolved. She has had some cough. She denies fever, or chills. She has had some loose stool. Past Medical History:  Diagnosis Date  . Altered mental state 07/01/2015  . Anemia   . Anxiety   . Aortic stenosis    Echo 8/13: EF 55-60%, Mild LVH, mild AS, mean gradient 12 mmHg,   . Benign neoplasm of stomach   . CAD (coronary artery disease)    a.  cath 2005: LAD 40%;  b. Myoview 3/12: EF 70%, no ischemia;  c. Myoview 9/13: EF 76%, no ischemia  . CAROTID ARTERY STENOSIS, WITHOUT INFARCTION 11/08/2009   Overview:  Overview:   Last Assessment & Plan:  She needs follow up carotid dopplers.  I will arrange. Overview:  2005-40 - 59% stenosis of RICA.  May have had f/u study since then but couldn't find results in cardiology notes     . CHF (congestive heart failure) ([place]) 06-13-12   episode after Cancer surgery  . Chronic headaches   . Chronic kidney disease    "my kidneys have been about to shut down" (07/01/2015)  . Chronic lower back pain   . Complication of anesthesia   . COPD (chronic obstructive pulmonary disease) ([place])   . Depression, major, in remission ([place]) 06/27/2011   Overview:  Also dx'd as PTSD. Followed by [facility] (formerly [facility])   . Diabetes mellitus without complication ([place])   . Dizziness and giddiness   . Esophageal reflux   . Family history of adverse reaction to anesthesia    sister had allergic reaction  (unsure of reaction)  . Fibromyalgia   . Gastroparesis   . History of blood transfusion    "when I had my hemorrhoid OR"  . Macular degeneration   . Malaise and fatigue 11/08/2010   Overview:  Last Assessment & Plan:  She describes symptoms c/w sleep apnea.  I will send her for sleep medicine consultation.   . Mixed hyperlipidemia 11/18/2009   Qualifier: Diagnosis of  By: Mack Guise    . Mixed incontinence 07/10/2015  . Obesity 03/18/2016  . Obstructive apnea 09/08/2011   NPSG-02/28/11- AHI 10.8/ hr, desat to 77% on room air, body weight 184 lbs   . Ovarian cancer ([facility]) 2000; 2002  . Personal history of transient ischemic attack (TIA), and cerebral infarction without residual deficits 11/20/2013  . PONV (postoperative nausea and vomiting)     Patient Active Problem List   Diagnosis Date Noted  . Submucosal lesion of esophagus   . Microalbuminuric diabetic nephropathy 06/28/2016  . Diabetes mellitus due to underlying condition, controlled, with long-term current use of insulin ([place]) 05/10/2016  . Chronic lower back pain 05/10/2016  . Generalized OA 05/10/2016  . Obesity 03/18/2016  . COPD (chronic obstructive pulmonary disease) ([place]) 03/18/2016  . Chronic headaches ( migraineous)  03/18/2016  . CAD (coronary artery disease) 03/18/2016  . Aortic stenosis 03/18/2016  . History of  genital warts 03/18/2016  . Chronic dryness of both eyes 03/18/2016  . Chronic kidney disease 03/08/2016  . Int Hemorrhoids: chronic blood in stool   . New onset of headaches after age 21 09/15/2015  . Snoring 09/15/2015  . Daytime hypersomnolence- OSA 09/15/2015  . 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 Bad memory 07/10/2015  . Mixed incontinence 07/10/2015  . Frailty 07/10/2015  . Altered mental state 07/01/2015  . Chronic hypoxic respiratory failure:  home O2 (Drs [place] Carbo and Young) 08/04/2014  . Allergic rhinitis 01/22/2014  . Personal history of transient ischemic attack (TIA), and cerebral  infarction without residual deficits 11/20/2013  . h/o Anemia 11/19/2013  . Problem situation relating to social and personal history 09/18/2013  . Polypharmacy 07/02/2012  . Other long term (current) drug therapy 07/02/2012  . h/o Acute pulmonary embolism ([place]) 06/21/2012  . CHF (congestive heart failure) ([place]) 06/13/2012  . Fatty liver disease, nonalcoholic [id number]  . Obstructive Sleep Apnea 09/08/2011  . Back pain, chronic 06/27/2011  . Depression, major, in remission ([place]) 06/27/2011  . Absence of bladder continence 06/27/2011  . Essential hypertension 06/27/2011  . Malaise and fatigue: Frailty, chronic deconditioning 11/08/2010  . Mixed hyperlipidemia 11/18/2009  . CAROTID ARTERY STENOSIS, WITHOUT INFARCTION 11/08/2009  . HYPERURICEMIA 07/29/2009  .  chronic DIZZINESS 11/18/2008  . h/o freq CANDIDIASIS, ORAL 08/25/2008  . Chronic Diarrhea 07/27/2008  . Polyuria 07/27/2008  . Chronic hepatitis C virus infection ([place]) 10/02/2007  . Esophageal reflux 10/02/2007  . Gastroparesis 10/02/2007  . Dysphagia 10/02/2007    Past Surgical History:  Procedure Laterality Date  . ABDOMINAL HYSTERECTOMY    . ANTERIOR AND POSTERIOR VAGINAL REPAIR    . APPENDECTOMY    . BACK SURGERY    . BAND HEMORRHOIDECTOMY    . BREAST BIOPSY Left 1972  . CATARACT EXTRACTION W/ INTRAOCULAR LENS  IMPLANT, BILATERAL Bilateral 03/2013  . CHOLECYSTECTOMY OPEN    . COLONOSCOPY N/A 08/11/2013   Procedure: COLONOSCOPY;  Surgeon: Inda Castle, MD;  Location: [facility]COPY;  Service: Endoscopy;  Laterality: N/A;  . COLONOSCOPY N/A 12/08/2015   Procedure: COLONOSCOPY;  Surgeon: Manus Gunning, MD;  Location: [facility] END[facility];  Service: Gastroenterology;  Laterality: N/A;  . DILATION AND CURETTAGE OF UTERUS    . ESOPHAGOGASTRODUODENOSCOPY N/A 08/01/2016   Procedure: ESOPHAGOGASTRODUODENOSCOPY (EGD);  Surgeon: Manus Gunning, MD;  Location: [facility];  Service: Gastroenterology;  Laterality: N/A;    . ESOPHAGOGASTRODUODENOSCOPY (EGD) WITH ESOPHAGEAL DILATION     Archie Endo 07/01/2015  . EUS N/A 09/07/2016   Procedure: UPPER ENDOSCOPIC ULTRASOUND (EUS) RADIAL;  Surgeon: Milus Banister, MD;  Location: [facility];  Service: Endoscopy;  Laterality: N/A;  . KNEE ARTHROSCOPY Left   . LUMBAR DISC SURGERY    . OOPHORECTOMY     1 ovary removed, then later last ovary removed  . TONSILLECTOMY  ~ 1958  . TUBAL LIGATION      OB History    No data available       Home Medications    Prior to Admission medications   Medication Sig Start Date End Date Taking? Authorizing Provider  amLODipine (NORVASC) 5 MG tablet Take 1 tablet (5 mg total) by mouth daily. 06/28/16 09/26/16  Deborah Opalski, DO  ARIPiprazole (ABILIFY) 5 MG tablet Take 5 mg by mouth daily.     Historical Provider, MD  aspirin EC 81 MG tablet Take 81 mg by mouth daily.    Historical Provider, MD  azelastine (ASTELIN) 0.1 % nasal spray 2  sprays each nostril four times daily for 3-5 days then twice a day Patient taking differently: Place 2 sprays into both nostrils daily.  07/04/16   [name], DO  B-D UF III MINI PEN NEEDLES 31G X 5 MM MISC USE ONCE DAILY WITH LANTUS AND VICTOZA 09/14/16   [name], DO  bisoprolol (ZEBETA) 5 MG tablet TAKE ONE TABLET BY MOUTH ONCE DAILY 07/25/16   Fay Records, MD  colestipol (COLESTID) 1 g tablet Take 1 g by mouth daily.     Historical Provider, MD  cycloSPORINE (RESTASIS) 0.05 % ophthalmic emulsion Place 1 drop into both eyes 2 (two) times daily.    Historical Provider, MD  diclofenac (VOLTAREN) 75 MG EC tablet TAKE ONE TABLET BY MOUTH TWICE DAILY 10/06/16   [name], DO  DULoxetine (CYMBALTA) 30 MG capsule Take 1 capsule (30 mg total) by mouth daily. 07/04/16   [name], DO  gabapentin (NEURONTIN) 300 MG capsule Take 1 capsule (300 mg total) by mouth as directed. 2 capsules at breakfast, one capsule at lunch and 2 capsules after dinner Patient taking differently: Take 300-600  mg by mouth 3 (three) times daily. 2 capsules at breakfast, one capsule at lunch and 2 capsules after dinner 07/28/16   [name], DO  gemfibrozil (LOPID) 600 MG tablet Take 600 mg by mouth 2 (two) times daily before a meal.    Historical Provider, MD  glucose blood test strip Check fasting blood sugar every morning and then 2 hours after largest meal. 09/15/16   [name], DO  hydrOXYzine (ATARAX/VISTARIL) 25 MG tablet One capsule every morning as needed for anxiety Take 25 mg by mouth. One capsule every morning as needed for anxiety Patient taking differently: Take 25 mg by mouth daily.  07/04/16   [name], DO  Insulin Glargine (LANTUS SOLOSTAR) 100 UNIT/ML Solostar Pen Inject 20 Units into the skin daily at 10 pm. 04/05/16   Deborah Opalski, DO  INVOKANA 100 MG TABS tablet TAKE ONE TABLET BY MOUTH ONCE DAILY BEFORE BREAKFAST 10/02/16   [name], DO  Liraglutide 18 MG/3ML SOPN Inject 0.1 mLs (0.6 mg total) into the skin every morning. Check FBS daily and write it down 05/10/16   Deborah Opalski, DO  lisinopril (PRINIVIL,ZESTRIL) 40 MG tablet Take 1 tablet (40 mg total) by mouth daily. 07/27/16   Fay Records, MD  ondansetron (ZOFRAN) 8 MG tablet Take 1 tablet (8 mg total) by mouth every 8 (eight) hours as needed for nausea or vomiting. 09/21/16   Manus Gunning, MD  OXYGEN-HELIUM IN Place 2-3 L into the nose continuous.     Historical Provider, MD  pantoprazole (PROTONIX) 40 MG tablet Take 1 tablet (40 mg total) by mouth daily. 07/04/16   Deborah Opalski, DO  sitaGLIPtin (JANUVIA) 100 MG tablet Take 1 tablet (100 mg total) by mouth daily. 07/03/16   [name], DO  solifenacin (VESICARE) 5 MG tablet Take 5 mg by mouth daily.  07/09/15   Historical Provider, MD  traZODone (DESYREL) 100 MG tablet Take 100 mg by mouth at bedtime.    Historical Provider, MD  Vitamin D, Ergocalciferol, (DRISDOL) 50000 units CAPS capsule Take 1 capsule (50,000 Units total) by mouth every 7  (seven) days. Patient taking differently: Take 50,000 Units by mouth every Saturday.  05/10/16   [name], DO    Family History Family History  Problem Relation Age of Onset  . Brain cancer Father     spine cancer  .  Heart disease Mother   . Hypertension Mother   . Stomach cancer Paternal Grandfather   . Breast cancer Sister   . Kidney disease Sister   . Cancer Sister     breast  . Hypertension Sister   . Diabetes Brother   . Esophageal cancer Brother   . Diabetes Maternal Aunt   . Diabetes Paternal Aunt   . Alcohol abuse Son   . Hyperlipidemia Son   . Hypertension Son   . Cancer Sister     breast  . Colon cancer Neg Hx     Social History Social History  Substance Use Topics  . Smoking status: Never Smoker  . Smokeless tobacco: Never Used  . Alcohol use No     Comment: 07/01/2015 "stopped  in before 2000"     Allergies   Sunflower oil; Iodine; Metoclopramide hcl; Shellfish allergy; Sulfa antibiotics; and Metformin and related   Review of Systems Review of Systems  All other systems reviewed and are negative.    Physical Exam Updated Vital Signs LMP  (LMP Unknown)   SpO2 94%   Physical Exam  Constitutional: She is oriented to person, place, and time. She appears well-developed and well-nourished. No distress.  Nasal cannula in place  HENT:  Head: Atraumatic.  Right Ear: External ear normal.  Left Ear: External ear normal.  Nose: Nose normal.  Mouth/Throat: Oropharynx is clear and moist.  Eyes: Conjunctivae and EOM are normal. Pupils are equal, round, and reactive to light.  Neck: Normal range of motion. Neck supple.  Cardiovascular: Normal rate, regular rhythm, normal heart sounds and intact distal pulses.   Pulmonary/Chest: Effort normal and breath sounds normal.  Abdominal: Soft. Bowel sounds are normal.  Musculoskeletal: Normal range of motion. She exhibits no edema, tenderness or deformity.  Neurological: She is alert and oriented to  person, place, and time. No cranial nerve deficit.  Skin: Skin is warm and dry. Capillary refill takes less than 2 seconds.  Psychiatric: She has a normal mood and affect.  Nursing note and vitals reviewed.    ED Treatments / Results  Labs (all labs ordered are listed, but only abnormal results are displayed) Labs Reviewed  BASIC METABOLIC PANEL - Abnormal; Notable for the following:       Result Value   CO2 21 (*)    Glucose, Bld 225 (*)    Creatinine, Ser 1.02 (*)    GFR calc non Af Amer 57 (*)    All other components within normal limits  CBC - Abnormal; Notable for the following:    RBC 3.83 (*)    Hemoglobin 10.6 (*)    HCT 33.4 (*)    All other components within normal limits  I-STAT TROPOININ, ED  I-STAT TROPOININ, ED    EKG  EKG Interpretation  Date/Time:  Wednesday October 11 2016 21:49:30 EST Ventricular Rate:  97 PR Interval:    QRS Duration: 139 QT Interval:  382 QTC Calculation: 486 R Axis:   -29 Text Interpretation:  Sinus rhythm Right bundle branch block Inferior infarct, old Baseline wander in lead(s) V1 Confirmed by Emmanuelle Hibbitts MD, Aloria Looper [phone]) on 10/11/2016 9:52:59 PM       Radiology Dg Chest 2 View  Result Date: 10/11/2016 CLINICAL DATA:  Chest pain and short of breath EXAM: CHEST  2 VIEW COMPARISON:  07/01/2015 FINDINGS: Right upper lobe scarring anteriorly. Elevated right hemidiaphragm with right lower lobe atelectasis. Negative for heart failure. Negative for pneumonia or effusion or mass lesion. IMPRESSION:  No active cardiopulmonary disease. Electronically Signed   By: Franchot Gallo M.D.   On: 10/11/2016 19:12    Procedures Procedures (including critical care time)  Medications Ordered in ED Medications - No data to display   Initial Impression / Assessment and Plan / ED Course  I have reviewed the triage vital signs and the nursing notes.  Pertinent labs & imaging results that were available during my care of the patient were reviewed by  me and considered in my medical decision making (see chart for details).     Patient with coronary artery disease, insulin dependent diabetes who presents today with chest pain that resolved with nitroglycerin. AG here significant for new right bundle branch block but no evidence of acute ischemia. Troponins are normal 2. Patient was pain-free but began having pain again and was given another nitroglycerin which resolved. Plan admission for further evaluation and treatment.  Discussed with Dr. Hal Hope and he will admit.   Final Clinical Impressions(s) / ED Diagnoses   Final diagnoses:  Chest pain, unspecified type  New onset right bundle branch block (RBBB)    New Prescriptions New Prescriptions   No medications on file     Pattricia Boss, MD 10/11/16 2226

## 2016-10-11 NOTE — Telephone Encounter (Signed)
Called patient and got her voice mail.  Advised that if she is still having chest pain or if it returns this evening that she go to ER for urgent evaluation.  Asked her to call back to office tomorrow to discuss further otherwise.  Upon further review it appears per her chart that patient is at the ER at this time.

## 2016-10-11 NOTE — Telephone Encounter (Signed)
New Message    Pt c/o of Chest Pain: STAT if CP now or developed within 24 hours  1. Are you having CP right now? Yes but not emergency - call when can  2. Are you experiencing any other symptoms (ex. SOB, nausea, vomiting, sweating)? SOB maybe but unsure  3. How long have you been experiencing CP? Just started today but only lasted 4  Minutes, then arm started hurting with it and the arm is still hurting  4. Is your CP continuous or coming and going? Come and going  5. Have you taken Nitroglycerin? No don't have any ?

## 2016-10-11 NOTE — ED Notes (Signed)
Patient at [facility]

## 2016-10-12 ENCOUNTER — Observation Stay (HOSPITAL_BASED_OUTPATIENT_CLINIC_OR_DEPARTMENT_OTHER): Payer: Medicare Other

## 2016-10-12 DIAGNOSIS — J449 Chronic obstructive pulmonary disease, unspecified: Secondary | ICD-10-CM

## 2016-10-12 DIAGNOSIS — R079 Chest pain, unspecified: Secondary | ICD-10-CM

## 2016-10-12 LAB — GLUCOSE, CAPILLARY
GLUCOSE-CAPILLARY: 219 mg/dL — AB (ref 65–99)
Glucose-Capillary: 134 mg/dL — ABNORMAL HIGH (ref 65–99)
Glucose-Capillary: 207 mg/dL — ABNORMAL HIGH (ref 65–99)

## 2016-10-12 LAB — NM MYOCAR MULTI W/SPECT W/WALL MOTION / EF
CHL CUP NUCLEAR SDS: 2
CHL CUP NUCLEAR SRS: 8
CHL CUP NUCLEAR SSS: 10
CHL CUP RESTING HR STRESS: 84 {beats}/min
CSEPED: 5 min
Estimated workload: 1 METS
Exercise duration (sec): 17 s
LHR: 0
LVDIAVOL: 68 mL (ref 46–106)
LVSYSVOL: 20 mL
NUC STRESS TID: 1.48
Peak HR: 97 {beats}/min

## 2016-10-12 LAB — CBC
HEMATOCRIT: 34.6 % — AB (ref 36.0–46.0)
HEMOGLOBIN: 11 g/dL — AB (ref 12.0–15.0)
MCH: 27.9 pg (ref 26.0–34.0)
MCHC: 31.8 g/dL (ref 30.0–36.0)
MCV: 87.8 fL (ref 78.0–100.0)
Platelets: 218 10*3/uL (ref 150–400)
RBC: 3.94 MIL/uL (ref 3.87–5.11)
RDW: 14.3 % (ref 11.5–15.5)
WBC: 7.4 10*3/uL (ref 4.0–10.5)

## 2016-10-12 LAB — CREATININE, SERUM
CREATININE: 1.18 mg/dL — AB (ref 0.44–1.00)
GFR calc Af Amer: 55 mL/min — ABNORMAL LOW (ref >60)
GFR calc non Af Amer: 48 mL/min — ABNORMAL LOW (ref >60)

## 2016-10-12 LAB — TROPONIN I: Troponin I: <0.03 ng/mL (ref <0.03)

## 2016-10-12 LAB — HIV ANTIBODY (ROUTINE TESTING W REFLEX): HIV Screen 4th Generation wRfx: NONREACTIVE

## 2016-10-12 LAB — ECHOCARDIOGRAM COMPLETE
Height: 64 in
Weight: 2849.6 oz

## 2016-10-12 MED ORDER — REGADENOSON 0.4 MG/5ML IV SOLN
INTRAVENOUS | Status: AC
  Administered 2016-10-12: 0.4 mg via INTRAVENOUS
  Filled 2016-10-12: qty 5

## 2016-10-12 MED ORDER — DARIFENACIN HYDROBROMIDE ER 7.5 MG PO TB24
7.5000 mg | ORAL_TABLET | Freq: Every day | ORAL | Status: DC
Start: 2016-10-12 — End: 2016-10-12
  Filled 2016-10-12: qty 1

## 2016-10-12 MED ORDER — GABAPENTIN 300 MG PO CAPS: 300.0000 mg | ORAL_CAPSULE | Freq: Three times a day (TID) | ORAL | Status: DC

## 2016-10-12 MED ORDER — LINAGLIPTIN 5 MG PO TABS
5.0000 mg | ORAL_TABLET | Freq: Every day | ORAL | Status: DC
  Administered 2016-10-12: 5 mg via ORAL
  Filled 2016-10-12: qty 1

## 2016-10-12 MED ORDER — NITROGLYCERIN 0.4 MG SL SUBL: 0.4000 mg | SUBLINGUAL_TABLET | SUBLINGUAL | Status: DC | PRN

## 2016-10-12 MED ORDER — DULOXETINE HCL 30 MG PO CPEP
30.0000 mg | ORAL_CAPSULE | Freq: Every day | ORAL | Status: DC
  Administered 2016-10-12: 30 mg via ORAL
  Filled 2016-10-12: qty 1

## 2016-10-12 MED ORDER — TECHNETIUM TC 99M TETROFOSMIN IV KIT
10.0000 | PACK | Freq: Once | INTRAVENOUS | Status: AC | PRN
  Administered 2016-10-12: 10 via INTRAVENOUS

## 2016-10-12 MED ORDER — PANTOPRAZOLE SODIUM 40 MG PO TBEC
40.0000 mg | DELAYED_RELEASE_TABLET | Freq: Every day | ORAL | Status: DC
  Administered 2016-10-12: 40 mg via ORAL
  Filled 2016-10-12: qty 1

## 2016-10-12 MED ORDER — GABAPENTIN 300 MG PO CAPS
600.0000 mg | ORAL_CAPSULE | Freq: Two times a day (BID) | ORAL | Status: DC
  Filled 2016-10-12 (×2): qty 2

## 2016-10-12 MED ORDER — TECHNETIUM TC 99M TETROFOSMIN IV KIT
30.0000 | PACK | Freq: Once | INTRAVENOUS | Status: AC | PRN
  Administered 2016-10-12: 30 via INTRAVENOUS

## 2016-10-12 MED ORDER — INSULIN ASPART 100 UNIT/ML ~~LOC~~ SOLN
0.0000 [IU] | Freq: Three times a day (TID) | SUBCUTANEOUS | Status: DC
  Administered 2016-10-12 (×2): 3 [IU] via SUBCUTANEOUS

## 2016-10-12 MED ORDER — ASPIRIN 325 MG PO TABS: 325.0000 mg | ORAL_TABLET | Freq: Every day | ORAL | Status: DC

## 2016-10-12 MED ORDER — COLESTIPOL HCL 1 G PO TABS
1.0000 g | ORAL_TABLET | Freq: Every day | ORAL | Status: DC
  Administered 2016-10-12: 1 g via ORAL
  Filled 2016-10-12: qty 1

## 2016-10-12 MED ORDER — GEMFIBROZIL 600 MG PO TABS
600.0000 mg | ORAL_TABLET | Freq: Two times a day (BID) | ORAL | Status: DC
  Administered 2016-10-12: 600 mg via ORAL
  Filled 2016-10-12 (×3): qty 1

## 2016-10-12 MED ORDER — ENOXAPARIN SODIUM 40 MG/0.4ML ~~LOC~~ SOLN
40.0000 mg | SUBCUTANEOUS | Status: DC
  Filled 2016-10-12: qty 0.4

## 2016-10-12 MED ORDER — TRAZODONE HCL 50 MG PO TABS
100.0000 mg | ORAL_TABLET | Freq: Every day | ORAL | Status: DC
  Administered 2016-10-12: 100 mg via ORAL
  Filled 2016-10-12: qty 2

## 2016-10-12 MED ORDER — BISOPROLOL FUMARATE 5 MG PO TABS
5.0000 mg | ORAL_TABLET | Freq: Every day | ORAL | Status: DC
  Administered 2016-10-12: 5 mg via ORAL
  Filled 2016-10-12: qty 1

## 2016-10-12 MED ORDER — LISINOPRIL 20 MG PO TABS
40.0000 mg | ORAL_TABLET | Freq: Every day | ORAL | Status: DC
  Administered 2016-10-12: 40 mg via ORAL
  Filled 2016-10-12: qty 2

## 2016-10-12 MED ORDER — GABAPENTIN 300 MG PO CAPS
300.0000 mg | ORAL_CAPSULE | Freq: Every day | ORAL | Status: DC
  Administered 2016-10-12: 300 mg via ORAL
  Filled 2016-10-12 (×2): qty 1

## 2016-10-12 MED ORDER — ARIPIPRAZOLE 5 MG PO TABS
5.0000 mg | ORAL_TABLET | Freq: Every day | ORAL | Status: DC
  Administered 2016-10-12: 5 mg via ORAL
  Filled 2016-10-12: qty 1

## 2016-10-12 MED ORDER — ACETAMINOPHEN 325 MG PO TABS: 650.0000 mg | ORAL_TABLET | ORAL | Status: DC | PRN

## 2016-10-12 MED ORDER — ASPIRIN EC 325 MG PO TBEC
325.0000 mg | DELAYED_RELEASE_TABLET | Freq: Every day | ORAL | Status: DC
  Administered 2016-10-12: 325 mg via ORAL
  Filled 2016-10-12: qty 1

## 2016-10-12 MED ORDER — INSULIN GLARGINE 100 UNIT/ML ~~LOC~~ SOLN
5.0000 [IU] | Freq: Every day | SUBCUTANEOUS | Status: DC
  Administered 2016-10-12: 5 [IU] via SUBCUTANEOUS
  Filled 2016-10-12 (×2): qty 0.05

## 2016-10-12 MED ORDER — ONDANSETRON HCL 4 MG/2ML IJ SOLN
4.0000 mg | Freq: Four times a day (QID) | INTRAMUSCULAR | Status: DC | PRN
  Administered 2016-10-12: 4 mg via INTRAVENOUS
  Filled 2016-10-12: qty 2

## 2016-10-12 MED ORDER — REGADENOSON 0.4 MG/5ML IV SOLN
0.4000 mg | Freq: Once | INTRAVENOUS | Status: AC
  Administered 2016-10-12: 0.4 mg via INTRAVENOUS
  Filled 2016-10-12: qty 5

## 2016-10-12 NOTE — Progress Notes (Signed)
Pt admitted after midnight. For details, please refer to admission note done 10/12/2016. Pt admitted for chest pain and needs myoview per cardio. Home once cleared by cardio. [name] [facility] [id number]

## 2016-10-12 NOTE — H&P (Signed)
History and Physical    [name] [name] [id number] DOB: 1950/10/08 DOA: 10/11/2016  PCP: [name], DO  Patient coming from: Home.  Chief Complaint: Chest pain.  HPI: [name] [name] is a 65 y.o. female with history of minimal CAD by cath in 2005 (LAD 40%), DM2, HL, GERD, HTN, chronic HCV, carotid stenosis, chest pain and palpitations. Myoview 3/12: EF 70%, no ischemia. Carotid Dopplers 10/10: RICA 40-59%. She has required esophageal dilatation in the past presents with chest pain. Patient's chest pain started last afternoon while making lunch. Pain was retrosternal, pressure-like, radiating to right arm. Denies any associated shortness of breath diaphoresis had some nausea denies any abdominal pain. Pain lasted almost for 4 hours and subsided after patient received sublingual nitroglycerin in the ER. EKG cardiac markers and chest x-ray negative. Given the risk factors patient is being admitted to rule out ACS.  ED Course: EKG chest x-ray and cardiac markers were unremarkable.  Review of Systems: As per HPI, rest all negative.   Past Medical History:  Diagnosis Date  . Altered mental state 07/01/2015  . Anemia   . Anxiety   . Aortic stenosis    Echo 8/13: EF 55-60%, Mild LVH, mild AS, mean gradient 12 mmHg,   . Benign neoplasm of stomach   . CAD (coronary artery disease)    a.  cath 2005: LAD 40%;  b. Myoview 3/12: EF 70%, no ischemia;  c. Myoview 9/13: EF 76%, no ischemia  . CAROTID ARTERY STENOSIS, WITHOUT INFARCTION 11/08/2009   Overview:  Overview:   Last Assessment & Plan:  She needs follow up carotid dopplers.  I will arrange. Overview:  2005-40 - 59% stenosis of RICA.  May have had f/u study since then but couldn't find results in cardiology notes     . CHF (congestive heart failure) ([place]) 06-13-12   episode after Cancer surgery  . Chronic headaches   . Chronic kidney disease    "my kidneys have been about to shut down" (07/01/2015)  . Chronic lower back pain   .  Complication of anesthesia   . COPD (chronic obstructive pulmonary disease) ([place])   . Depression, major, in remission ([place]) 06/27/2011   Overview:  Also dx'd as PTSD. Followed by [place] (formerly [facility])   . Diabetes mellitus without complication ([place])   . Dizziness and giddiness   . Esophageal reflux   . Family history of adverse reaction to anesthesia    sister had allergic reaction (unsure of reaction)  . Fibromyalgia   . Gastroparesis   . History of blood transfusion    "when I had my hemorrhoid OR"  . Macular degeneration   . Malaise and fatigue 11/08/2010   Overview:  Last Assessment & Plan:  She describes symptoms c/w sleep apnea.  I will send her for sleep medicine consultation.   . Mixed hyperlipidemia 11/18/2009   Qualifier: Diagnosis of  By: Mack Guise    . Mixed incontinence 07/10/2015  . Obesity 03/18/2016  . Obstructive apnea 09/08/2011   NPSG-02/28/11- AHI 10.8/ hr, desat to 77% on room air, body weight 184 lbs   . Ovarian cancer ([place]) 2000; 2002  . Personal history of transient ischemic attack (TIA), and cerebral infarction without residual deficits 11/20/2013  . PONV (postoperative nausea and vomiting)     Past Surgical History:  Procedure Laterality Date  . ABDOMINAL HYSTERECTOMY    . ANTERIOR AND POSTERIOR VAGINAL REPAIR    . APPENDECTOMY    .  BACK SURGERY    . BAND HEMORRHOIDECTOMY    . BREAST BIOPSY Left 1972  . CATARACT EXTRACTION W/ INTRAOCULAR LENS  IMPLANT, BILATERAL Bilateral 03/2013  . CHOLECYSTECTOMY OPEN    . COLONOSCOPY N/A 08/11/2013   Procedure: COLONOSCOPY;  Surgeon: Inda Castle, MD;  [facility];  Service: Endoscopy;  Laterality: N/A;  . COLONOSCOPY N/A 12/08/2015   Procedure: COLONOSCOPY;  Surgeon: Manus Gunning, MD;  [facility];  Service: Gastroenterology;  Laterality: N/A;  . DILATION AND CURETTAGE OF UTERUS    . ESOPHAGOGASTRODUODENOSCOPY N/A 08/01/2016   Procedure:  ESOPHAGOGASTRODUODENOSCOPY (EGD);  Surgeon: Manus Gunning, MD;  Location: [facility];  Service: Gastroenterology;  Laterality: N/A;  . ESOPHAGOGASTRODUODENOSCOPY (EGD) WITH ESOPHAGEAL DILATION     Archie Endo 07/01/2015  . EUS N/A 09/07/2016   Procedure: UPPER ENDOSCOPIC ULTRASOUND (EUS) RADIAL;  Surgeon: Milus Banister, MD;  [facility];  Service: Endoscopy;  Laterality: N/A;  . KNEE ARTHROSCOPY Left   . LUMBAR DISC SURGERY    . OOPHORECTOMY     1 ovary removed, then later last ovary removed  . TONSILLECTOMY  ~ 1958  . TUBAL LIGATION       reports that she has never smoked. She has never used smokeless tobacco. She reports that she does not drink alcohol or use drugs.  Allergies  Allergen Reactions  . Sunflower Oil Anaphylaxis  . Iodine Nausea And Vomiting    Fish causes n/v  . Metoclopramide Hcl Other (See Comments)    Nerves tense up, panic attack  . Shellfish Allergy Nausea And Vomiting  . Sulfa Antibiotics Nausea And Vomiting  . Metformin And Related Diarrhea    Family History  Problem Relation Age of Onset  . Brain cancer Father     spine cancer  . Heart disease Mother   . Hypertension Mother   . Stomach cancer Paternal Grandfather   . Breast cancer Sister   . Kidney disease Sister   . Cancer Sister     breast  . Hypertension Sister   . Diabetes Brother   . Esophageal cancer Brother   . Diabetes Maternal Aunt   . Diabetes Paternal Aunt   . Alcohol abuse Son   . Hyperlipidemia Son   . Hypertension Son   . Cancer Sister     breast  . Colon cancer Neg Hx     Prior to Admission medications   Medication Sig Start Date End Date Taking? Authorizing Provider  amLODipine (NORVASC) 5 MG tablet Take 1 tablet (5 mg total) by mouth daily. 06/28/16 10/11/16 Yes Deborah Opalski, DO  ARIPiprazole (ABILIFY) 5 MG tablet Take 5 mg by mouth daily.    Yes Historical Provider, MD  aspirin 325 MG tablet Take 325 mg by mouth daily.   Yes Historical Provider, MD    azelastine (ASTELIN) 0.1 % nasal spray 2 sprays each nostril four times daily for 3-5 days then twice a day Patient taking differently: Place 2 sprays into both nostrils daily as needed for allergies.  07/04/16  Yes Deborah Opalski, DO  bisoprolol (ZEBETA) 5 MG tablet TAKE ONE TABLET BY MOUTH ONCE DAILY 07/25/16  Yes Fay Records, MD  colestipol (COLESTID) 1 g tablet Take 1 g by mouth daily.    Yes Historical Provider, MD  cycloSPORINE (RESTASIS) 0.05 % ophthalmic emulsion Place 1 drop into both eyes 2 (two) times daily.   Yes Historical Provider, MD  DULoxetine (CYMBALTA) 30 MG capsule Take 1 capsule (30 mg total)  by mouth daily. 07/04/16  Yes Deborah Opalski, DO  gabapentin (NEURONTIN) 300 MG capsule Take 1 capsule (300 mg total) by mouth as directed. 2 capsules at breakfast, one capsule at lunch and 2 capsules after dinner Patient taking differently: Take 300-600 mg by mouth 3 (three) times daily. 2 capsules at breakfast, one capsule at lunch and 2 capsules after dinner 07/28/16  Yes Deborah Opalski, DO  gemfibrozil (LOPID) 600 MG tablet Take 600 mg by mouth 2 (two) times daily before a meal.   Yes Historical Provider, MD  hydrOXYzine (VISTARIL) 25 MG capsule Take 25 mg by mouth every morning. 10/03/16  Yes Historical Provider, MD  Insulin Glargine (LANTUS SOLOSTAR) 100 UNIT/ML Solostar Pen Inject 20 Units into the skin daily at 10 pm. 04/05/16  Yes Deborah Opalski, DO  INVOKANA 100 MG TABS tablet TAKE ONE TABLET BY MOUTH ONCE DAILY BEFORE BREAKFAST 10/02/16  Yes Deborah Opalski, DO  Liraglutide 18 MG/3ML SOPN Inject 0.1 mLs (0.6 mg total) into the skin every morning. Check FBS daily and write it down 05/10/16  Yes Deborah Opalski, DO  lisinopril (PRINIVIL,ZESTRIL) 40 MG tablet Take 1 tablet (40 mg total) by mouth daily. 07/27/16  Yes Fay Records, MD  OXYGEN-HELIUM IN Place 3 L into the nose continuous.    Yes Historical Provider, MD  pantoprazole (PROTONIX) 40 MG tablet Take 1 tablet (40 mg total) by  mouth daily. 07/04/16  Yes Deborah Opalski, DO  sitaGLIPtin (JANUVIA) 100 MG tablet Take 1 tablet (100 mg total) by mouth daily. 07/03/16  Yes Deborah Opalski, DO  solifenacin (VESICARE) 5 MG tablet Take 5 mg by mouth daily.  07/09/15  Yes Historical Provider, MD  traZODone (DESYREL) 100 MG tablet Take 100 mg by mouth at bedtime.   Yes Historical Provider, MD  Vitamin D, Ergocalciferol, (DRISDOL) 50000 units CAPS capsule Take 1 capsule (50,000 Units total) by mouth every 7 (seven) days. Patient taking differently: Take 50,000 Units by mouth every Saturday.  05/10/16  Yes [name], DO    Physical Exam: Vitals:   10/11/16 2130 10/11/16 2200 10/11/16 2230 10/11/16 2300  BP: 108/56 120/71 (!) 96/52 (!) 98/52  Pulse: 95 95 93 91  Resp: 23 24 21 18   SpO2: 94% 96% 95% 96%      Constitutional: Moderately built and nourished. Vitals:   10/11/16 2130 10/11/16 2200 10/11/16 2230 10/11/16 2300  BP: 108/56 120/71 (!) 96/52 (!) 98/52  Pulse: 95 95 93 91  Resp: 23 24 21 18   SpO2: 94% 96% 95% 96%   Eyes: Anicteric no pallor. ENMT: No discharge from the ears eyes nose or mouth. Neck: No mass felt. No neck rigidity. Respiratory: No rhonchi or crepitations. Cardiovascular: S1-S2 heard no murmurs appreciated. Abdomen: Soft nontender bowel sounds present. Musculoskeletal: No edema. No joint effusion. Skin: No rash. Skin appears warm. Neurologic: Alert awake oriented to time place and person. Moves all extremities. Psychiatric: Appears normal. Normal affect.   Labs on Admission: I have personally reviewed following labs and imaging studies  CBC:  Recent Labs Lab 10/11/16 1833  WBC 6.8  HGB 10.6*  HCT 33.4*  MCV 87.2  PLT 0000000   Basic Metabolic Panel:  Recent Labs Lab 10/11/16 1833  NA 140  K 4.0  CL 109  CO2 21*  GLUCOSE 225*  BUN 15  CREATININE 1.02*  CALCIUM 9.1   GFR: Estimated Creatinine Clearance: 57.6 mL/min (by C-G formula based on SCr of 1.02 mg/dL (H)). Liver  Function Tests: No results for  input(s): AST, ALT, ALKPHOS, BILITOT, PROT, ALBUMIN in the last 168 hours. No results for input(s): LIPASE, AMYLASE in the last 168 hours. No results for input(s): AMMONIA in the last 168 hours. Coagulation Profile: No results for input(s): INR, PROTIME in the last 168 hours. Cardiac Enzymes: No results for input(s): CKTOTAL, CKMB, CKMBINDEX, TROPONINI in the last 168 hours. BNP (last 3 results) No results for input(s): PROBNP in the last 8760 hours. HbA1C:  Recent Labs  10/10/16 0929  HGBA1C 7.4   CBG:  Recent Labs Lab 10/11/16 2246  GLUCAP 159*   Lipid Profile: No results for input(s): CHOL, HDL, LDLCALC, TRIG, CHOLHDL, LDLDIRECT in the last 72 hours. Thyroid Function Tests: No results for input(s): TSH, T4TOTAL, FREET4, T3FREE, THYROIDAB in the last 72 hours. Anemia Panel: No results for input(s): VITAMINB12, FOLATE, FERRITIN, TIBC, IRON, RETICCTPCT in the last 72 hours. Urine analysis:    Component Value Date/Time   COLORURINE YELLOW 07/07/2015 1350   APPEARANCEUR CLEAR 07/07/2015 1350   LABSPEC >1.030 (H) 07/07/2015 1350   PHURINE 6.0 07/07/2015 1350   GLUCOSEU NEGATIVE 07/07/2015 1350   GLUCOSEU 500 04/10/2012 1504   HGBUR NEGATIVE 07/07/2015 1350   BILIRUBINUR SMALL (A) 07/07/2015 1350   KETONESUR NEGATIVE 07/07/2015 1350   PROTEINUR NEGATIVE 07/07/2015 1350   UROBILINOGEN 0.2 07/07/2015 1350   NITRITE NEGATIVE 07/07/2015 1350   LEUKOCYTESUR NEGATIVE 07/07/2015 1350   Sepsis Labs: @LABRCNTIP (procalcitonin:4,lacticidven:4) )No results found for this or any previous visit (from the past 240 hour(s)).   Radiological Exams on Admission: Dg Chest 2 View  Result Date: 10/11/2016 CLINICAL DATA:  Chest pain and short of breath EXAM: CHEST  2 VIEW COMPARISON:  07/01/2015 FINDINGS: Right upper lobe scarring anteriorly. Elevated right hemidiaphragm with right lower lobe atelectasis. Negative for heart failure. Negative for pneumonia or  effusion or mass lesion. IMPRESSION: No active cardiopulmonary disease. Electronically Signed   By: Franchot Gallo M.D.   On: 10/11/2016 19:12    EKG: Independently reviewed. Normal sinus rhythm with RBBB.  Assessment/Plan Principal Problem:   Chest pain Active Problems:   Obstructive Sleep Apnea   COPD (chronic obstructive pulmonary disease) (HCC)   Diabetes mellitus due to underlying condition, controlled, with long-term current use of insulin ([place])    1. Chest pain - given the risk factors including hypertension diabetes mellitus and hyperlipidemia will admit for observation to rule out ACS. Will keep patient nothing by mouth in a.m. in anticipation of cardiac procedure. Cycle cardiac markers check 2-D echo aspirin and when necessary nitroglycerin continue beta blocker and Lopid. 2. Hypertension - on Norvasc beta blocker lisinopril. 3. Diabetes mellitus type 2 - will decrease Lantus dose since patient will be in. In a.m. Closely follow CBGs. 4. Hyperlipidemia on Lopid and colestipol. 5. Sleep apnea. 6. Depression on Abilify and Cymbalta.   DVT prophylaxis: Lovenox. Code Status: Full code.  Family Communication: Patient's son.  Disposition Plan: Home.  Consults called: None.  Admission status: Observation.    Rise Patience MD [facility] Pager [phone].  If 7PM-7AM, please contact night-coverage [email or web address] Password [email or web address]  10/12/2016, 12:01 AM

## 2016-10-12 NOTE — Care Management Obs Status (Signed)
[facility] NOTIFICATION   Patient Details  Name: ZANAH MISKIEWICZ MRN: [id number] Date of Birth: [birth year]   Medicare Observation Status Notification Given:  Yes    Bethena Roys, RN 10/12/2016, 2:14 PM

## 2016-10-12 NOTE — Progress Notes (Signed)
Patient presented for Lexiscan. Tolerated procedure well. Pending final stress imaging result.  

## 2016-10-12 NOTE — Consult Note (Signed)
CARDIOLOGY CONSULT NOTE       Patient ID: [name] [name] MRN: [id number] DOB/AGE: 1951-04-27 66 y.o.  Admit date: 10/11/2016 Referring Physician: Charlies Silvers Primary Physician: [name], DO Primary Cardiologist: Dorris Carnes Reason for Consultation:  Chest Pain   Principal Problem:   Chest pain Active Problems:   Obstructive Sleep Apnea   COPD (chronic obstructive pulmonary disease) ([place])   Diabetes mellitus due to underlying condition, controlled, with long-term current use of insulin ([place])   HPI:  [name] [name] is a 66 y.o.  female with a history ofminimal CAD by cath in 2005 (LAD 40%), DM2, HL, GERD, HTN, chronic HCV, carotid stenosis, chest pain and palpitations. Myoview 3/12: EF 70%, no ischemia. Carotid Dopplers 10/10: RICA 40-59%. She has required esophageal dilatation in the past. [facility] the [facility]. Echo demonstrated normal LVF, mild AS, mild LVH Also has history of PE after kne surgery  2016.  EGD 09/07/16 Ardis Hughs for dysphagia ? Bulge in esophagus from extrinsic vessel no Rx  Recommended.  But has had esophageal dilatation in past  Admitted with [facility]P. Started lunch time yesterday. Retrosternal radiating to right arm Some Nausea Pain for 4 hours SL nitro did help in ER. In ER ECG with new RBBB since 2016 Markers negative and CXR NAD. Pain free this am   ROS All other systems reviewed and negative except as noted above  Past Medical History:  Diagnosis Date  . Altered mental state 07/01/2015  . Anemia   . Anxiety   . Aortic stenosis    Echo 8/13: EF 55-60%, Mild LVH, mild AS, mean gradient 12 mmHg,   . Benign neoplasm of stomach   . CAD (coronary artery disease)    a.  cath 2005: LAD 40%;  b. Myoview 3/12: EF 70%, no ischemia;  c. Myoview 9/13: EF 76%, no ischemia  . CAROTID ARTERY STENOSIS, WITHOUT INFARCTION 11/08/2009   Overview:  Overview:   Last Assessment & Plan:  She needs follow up carotid dopplers.  I will arrange.  Overview:  2005-40 - 59% stenosis of RICA.  May have had f/u study since then but couldn't find results in cardiology notes     . CHF (congestive heart failure) ([place]) 06-13-12   episode after Cancer surgery  . Chronic headaches   . Chronic kidney disease    "my kidneys have been about to shut down" (07/01/2015)  . Chronic lower back pain   . Complication of anesthesia   . COPD (chronic obstructive pulmonary disease) ([place])   . Depression, major, in remission ([place]) 06/27/2011   Overview:  Also dx'd as PTSD. Followed by [place] (formerly [facility])   . Diabetes mellitus without complication ([place])   . Dizziness and giddiness   . Esophageal reflux   . Family history of adverse reaction to anesthesia    sister had allergic reaction (unsure of reaction)  . Fibromyalgia   . Gastroparesis   . History of blood transfusion    "when I had my hemorrhoid OR"  . Macular degeneration   . Malaise and fatigue 11/08/2010   Overview:  Last Assessment & Plan:  She describes symptoms c/w sleep apnea.  I will send her for sleep medicine consultation.   . Mixed hyperlipidemia 11/18/2009   Qualifier: Diagnosis of  By: Mack Guise    . Mixed incontinence 07/10/2015  . Obesity 03/18/2016  . Obstructive apnea 09/08/2011   NPSG-02/28/11- AHI 10.8/ hr, desat to 77% on room  air, body weight 184 lbs   . Ovarian cancer ([place]) 2000; 2002  . Personal history of transient ischemic attack (TIA), and cerebral infarction without residual deficits 11/20/2013  . PONV (postoperative nausea and vomiting)     Family History  Problem Relation Age of Onset  . Brain cancer Father     spine cancer  . Heart disease Mother   . Hypertension Mother   . Stomach cancer Paternal Grandfather   . Breast cancer Sister   . Kidney disease Sister   . Cancer Sister     breast  . Hypertension Sister   . Diabetes Brother   . Esophageal cancer Brother   . Diabetes Maternal Aunt   . Diabetes Paternal Aunt   . Alcohol  abuse Son   . Hyperlipidemia Son   . Hypertension Son   . Cancer Sister     breast  . Colon cancer Neg Hx     Social History   Social History  . Marital status: Widowed    Spouse name: N/A  . Number of children: 3  . Years of education: N/A   Occupational History  . Disabled Unemployed    memory loss   Social History Main Topics  . Smoking status: Never Smoker  . Smokeless tobacco: Never Used  . Alcohol use No     Comment: 07/01/2015 "stopped  in before 2000"  . Drug use: No     Comment: 07/01/2015 "qut in 2000; tried pot, didn't enjoy it"  . Sexual activity: No   Other Topics Concern  . Not on file   Social History Narrative   ** Merged History Encounter **       Lives Caffeine use:    Past Surgical History:  Procedure Laterality Date  . ABDOMINAL HYSTERECTOMY    . ANTERIOR AND POSTERIOR VAGINAL REPAIR    . APPENDECTOMY    . BACK SURGERY    . BAND HEMORRHOIDECTOMY    . BREAST BIOPSY Left 1972  . CATARACT EXTRACTION W/ INTRAOCULAR LENS  IMPLANT, BILATERAL Bilateral 03/2013  . CHOLECYSTECTOMY OPEN    . COLONOSCOPY N/A 08/11/2013   Procedure: COLONOSCOPY;  Surgeon: Inda Castle, MD;  [facility];  Service: Endoscopy;  Laterality: N/A;  . COLONOSCOPY N/A 12/08/2015   Procedure: COLONOSCOPY;  Surgeon: Manus Gunning, MD;  [facility];  Service: Gastroenterology;  Laterality: N/A;  . DILATION AND CURETTAGE OF UTERUS    . ESOPHAGOGASTRODUODENOSCOPY N/A 08/01/2016   Procedure: ESOPHAGOGASTRODUODENOSCOPY (EGD);  Surgeon: Manus Gunning, MD;  Location: [facility];  Service: Gastroenterology;  Laterality: N/A;  . ESOPHAGOGASTRODUODENOSCOPY (EGD) WITH ESOPHAGEAL DILATION     Archie Endo 07/01/2015  . EUS N/A 09/07/2016   Procedure: UPPER ENDOSCOPIC ULTRASOUND (EUS) RADIAL;  Surgeon: Milus Banister, MD;  [facility];  Service: Endoscopy;  Laterality: N/A;  . KNEE ARTHROSCOPY Left   . LUMBAR DISC SURGERY    . OOPHORECTOMY      1 ovary removed, then later last ovary removed  . TONSILLECTOMY  ~ 1958  . TUBAL LIGATION       . ARIPiprazole  5 mg Oral Daily  . aspirin EC  325 mg Oral Daily  . bisoprolol  5 mg Oral Daily  . colestipol  1 g Oral Daily  . darifenacin  7.5 mg Oral Daily  . DULoxetine  30 mg Oral Daily  . enoxaparin (LOVENOX) injection  40 mg Subcutaneous Q24H  . gabapentin  300 mg Oral Q lunch  .  gabapentin  600 mg Oral BID WC  . gemfibrozil  600 mg Oral BID AC  . insulin aspart  0-9 Units Subcutaneous TID WC  . insulin glargine  5 Units Subcutaneous QHS  . linagliptin  5 mg Oral Daily  . lisinopril  40 mg Oral Daily  . pantoprazole  40 mg Oral Daily  . regadenoson  0.4 mg Intravenous Once  . traZODone  100 mg Oral QHS     Physical Exam: Blood pressure 121/70, pulse 82, temperature 98.3 F (36.8 C), temperature source Oral, resp. rate 18, height 5\' 4"  (1.626 m), weight 178 lb 1.6 oz (80.8 kg), SpO2 98 %.    Affect appropriate Obese white female  HEENT: normal Neck supple with no adenopathy JVP normal no bruits no thyromegaly Lungs clear with no wheezing and good diaphragmatic motion Heart:  S1/S2 mild AS  murmur, no rub, gallop or click PMI normal Abdomen: benighn, BS positve, no tenderness, no AAA no bruit.  No HSM or HJR Distal pulses intact with no bruits No edema Neuro non-focal Skin warm and dry No muscular weakness   Labs:   Lab Results  Component Value Date   WBC 7.4 10/12/2016   HGB 11.0 (L) 10/12/2016   HCT 34.6 (L) 10/12/2016   MCV 87.8 10/12/2016   PLT 218 10/12/2016     Recent Labs Lab 10/11/16 1833 10/12/16 0023  NA 140  --   K 4.0  --   CL 109  --   CO2 21*  --   BUN 15  --   CREATININE 1.02* 1.18*  CALCIUM 9.1  --   GLUCOSE 225*  --    Lab Results  Component Value Date   CKTOTAL 71 08/05/2015   CKMB 1.0 05/21/2009   TROPONINI <0.03 10/12/2016    Lab Results  Component Value Date   CHOL 151 12/13/2015   CHOL 102 07/02/2015   CHOL 136  11/12/2013   Lab Results  Component Value Date   HDL 49 12/13/2015   HDL 18 (L) 07/02/2015   HDL 19 (L) 11/12/2013   Lab Results  Component Value Date   LDLCALC 41 12/13/2015   LDLCALC 40 07/02/2015   LDLCALC 42 11/12/2013   Lab Results  Component Value Date   TRIG 306 (H) 12/13/2015   TRIG 218 (H) 07/02/2015   TRIG 374 (H) 11/12/2013   Lab Results  Component Value Date   CHOLHDL 3.1 12/13/2015   CHOLHDL 5.7 07/02/2015   CHOLHDL 7.2 11/12/2013   Lab Results  Component Value Date   LDLDIRECT 63.6 07/29/2009   LDLDIRECT 96.1 12/25/2006      Radiology: Dg Chest 2 View  Result Date: 10/11/2016 CLINICAL DATA:  Chest pain and short of breath EXAM: CHEST  2 VIEW COMPARISON:  07/01/2015 FINDINGS: Right upper lobe scarring anteriorly. Elevated right hemidiaphragm with right lower lobe atelectasis. Negative for heart failure. Negative for pneumonia or effusion or mass lesion. IMPRESSION: No active cardiopulmonary disease. Electronically Signed   By: Franchot Gallo M.D.   On: 10/11/2016 19:12    EKG: SR RBBB    ASSESSMENT AND PLAN:  Chest Pain: R/O ECG with only new RBBB. Previous cath 2012 no significant disease Will risk stratify with myovue this am.   Murmur:  Update echo AS does not sound severe  GI:  Recent endoscopy with no lesion extrinsic compression. Patient denies other GI Symptoms with pain in chest  Carotid:  No bruit last duplex 10/2013 with 1-39% bilateral disease can  update as outpatient  DM:  Discussed low carb diet.  Target hemoglobin A1c is 6.5 or less.  Continue current medications.   SignedJenkins Rouge 10/12/2016, 8:13 AM

## 2016-10-12 NOTE — Progress Notes (Addendum)
Normal Myoview.Echo showed normal EF, grade 1 DD, no wm abnormality. There was an atrial septal aneurysm. Aortic valve: Right coronary cusp mobility was severely   restricted. There was mild stenosis. Valve area (VTI): 1.91 cm^2.   Valve area (Vmax): 1.98 cm^2. Valve area (Vmean): 2.01 cm^2.  Pt can be discharged on cardiac stand point.

## 2016-10-12 NOTE — Telephone Encounter (Signed)
Pt was admitted to the hospital.  Will route to Dr. Harrington Challenger as Juluis Rainier

## 2016-10-12 NOTE — Discharge Instructions (Signed)
Chest Wall Pain °Chest wall pain is pain in or around the bones and muscles of your chest. Sometimes, an injury causes this pain. Sometimes, the cause may not be known. This pain may take several weeks or longer to get better. °Follow these instructions at home: °Pay attention to any changes in your symptoms. Take these actions to help with your pain: °· Rest as told by your health care provider. °· Avoid activities that cause pain. These include any activities that use your chest muscles or your abdominal and side muscles to lift heavy items. °· If directed, apply ice to the painful area: °¨ Put ice in a plastic bag. °¨ Place a towel between your skin and the bag. °¨ Leave the ice on for 20 minutes, 2-3 times per day. °· Take over-the-counter and prescription medicines only as told by your health care provider. °· Do not use tobacco products, including cigarettes, chewing tobacco, and e-cigarettes. If you need help quitting, ask your health care provider. °· Keep all follow-up visits as told by your health care provider. This is important. °Contact a health care provider if: °· You have a fever. °· Your chest pain becomes worse. °· You have new symptoms. °Get help right away if: °· You have nausea or vomiting. °· You feel sweaty or light-headed. °· You have a cough with phlegm (sputum) or you cough up blood. °· You develop shortness of breath. °This information is not intended to replace advice given to you by your health care provider. Make sure you discuss any questions you have with your health care provider. °Document Released: 08/14/2005 Document Revised: 12/23/2015 Document Reviewed: 11/09/2014 °Elsevier Interactive Patient Education © 2017 [employer] ° °

## 2016-10-12 NOTE — Discharge Summary (Signed)
Physician Discharge Summary  [name] [name] Tech [id number] DOB: 12/05/1950 DOA: 10/11/2016  PCP: [name], DO  Admit date: 10/11/2016 Discharge date: 10/12/2016  Recommendations for Outpatient Follow-up:  1. No changes in meds on discharge   Discharge Diagnoses:  Principal Problem:   Chest pain Active Problems:   Obstructive Sleep Apnea   COPD (chronic obstructive pulmonary disease) (HCC)   Diabetes mellitus due to underlying condition, controlled, with long-term current use of insulin ([name])  Discharge Condition: stable   Diet recommendation: as tolerated   History of present illness:   Per HPI "66 y.o. female with history of minimal CAD by cath in 2005 (LAD 40%), DM2, HL, GERD, HTN, chronic HCV, carotid stenosis, chest pain and palpitations. Myoview 3/12: EF 70%, no ischemia. Carotid Dopplers 10/10: RICA 40-59%. She has required esophageal dilatation in the past presents with chest pain. Patient's chest pain started last afternoon while making lunch. Pain was retrosternal, pressure-like, radiating to right arm. Denies any associated shortness of breath diaphoresis had some nausea denies any abdominal pain. Pain lasted almost for 4 hours and subsided after patient received sublingual nitroglycerin in the ER. EKG cardiac markers and chest x-ray negative. Given the risk factors patient is being admitted to rule out ACS. ED Course: EKG chest x-ray and cardiac markers were unremarkable."  Hospital Course:   Principal Problem:   Chest pain - ACS ruled out - Normal trop level - Normal Myoview - ECHO with normal EF - Continue daily aspirin   Active Problems:   COPD (chronic obstructive pulmonary disease) (HCC) - Stable - Not in acute exacerbation    Diabetes mellitus due to underlying condition, controlled, with long-term current use of insulin (HCC) - Continue home meds - Continue gabapentin for diabetic neuropathy     Essential hypertension - Continue home meds     Depression - Continue Abilify and Cymbalta     Dyslipidemia associated with type 2 DM - Continue statin therapy   Signed:  [name], MD  [facility] 10/12/2016, 6:50 PM  Pager #: [phone]  Time spent in minutes: less than 30 minutes  Procedures:  Myoview - normal  ECHO - normal ED  Consultations:  Cardiology  Discharge Exam: Vitals:   10/12/16 1013 10/12/16 1110  BP: 130/70 (!) 113/59  Pulse:    Resp:    Temp:     Vitals:   10/12/16 1009 10/12/16 1011 10/12/16 1013 10/12/16 1110  BP: 126/74 128/70 130/70 (!) 113/59  Pulse:      Resp:      Temp:      TempSrc:      SpO2:      Weight:      Height:        General: Pt is alert, follows commands appropriately, not in acute distress Cardiovascular: Regular rate and rhythm, S1/S2 + Respiratory: Clear to auscultation bilaterally, no wheezing, no crackles, no rhonchi Abdominal: Soft, non tender, non distended, bowel sounds +, no guarding Extremities: no edema, no cyanosis, pulses palpable bilaterally DP and PT Neuro: Grossly nonfocal  Discharge Instructions  Discharge Instructions    Call MD for:  persistant nausea and vomiting    Complete by:  As directed    Call MD for:  redness, tenderness, or signs of infection (pain, swelling, redness, odor or green/yellow discharge around incision site)    Complete by:  As directed    Call MD for:  severe uncontrolled pain    Complete by:  As directed    Diet -  low sodium heart healthy    Complete by:  As directed    Increase activity slowly    Complete by:  As directed      Allergies as of 10/12/2016      Reactions   Sunflower Oil Anaphylaxis   Iodine Nausea And Vomiting   Fish causes n/v   Metoclopramide Hcl Other (See Comments)   Nerves tense up, panic attack   Shellfish Allergy Nausea And Vomiting   Sulfa Antibiotics Nausea And Vomiting   Metformin And Related Diarrhea      Medication List    TAKE these medications   amLODipine 5 MG  tablet Commonly known as:  NORVASC Take 1 tablet (5 mg total) by mouth daily.   ARIPiprazole 5 MG tablet Commonly known as:  ABILIFY Take 5 mg by mouth daily.   aspirin 325 MG tablet Take 325 mg by mouth daily.   azelastine 0.1 % nasal spray Commonly known as:  ASTELIN 2 sprays each nostril four times daily for 3-5 days then twice a day What changed:  how much to take  how to take this  when to take this  reasons to take this  additional instructions   bisoprolol 5 MG tablet Commonly known as:  ZEBETA TAKE ONE TABLET BY MOUTH ONCE DAILY   colestipol 1 g tablet Commonly known as:  COLESTID Take 1 g by mouth daily.   cycloSPORINE 0.05 % ophthalmic emulsion Commonly known as:  RESTASIS Place 1 drop into both eyes 2 (two) times daily.   DULoxetine 30 MG capsule Commonly known as:  CYMBALTA Take 1 capsule (30 mg total) by mouth daily.   gabapentin 300 MG capsule Commonly known as:  NEURONTIN Take 1 capsule (300 mg total) by mouth as directed. 2 capsules at breakfast, one capsule at lunch and 2 capsules after dinner What changed:  how much to take  when to take this  additional instructions   gemfibrozil 600 MG tablet Commonly known as:  LOPID Take 600 mg by mouth 2 (two) times daily before a meal.   hydrOXYzine 25 MG capsule Commonly known as:  VISTARIL Take 25 mg by mouth every morning.   Insulin Glargine 100 UNIT/ML Solostar Pen Commonly known as:  LANTUS SOLOSTAR Inject 20 Units into the skin daily at 10 pm.   INVOKANA 100 MG Tabs tablet Generic drug:  canagliflozin TAKE ONE TABLET BY MOUTH ONCE DAILY BEFORE BREAKFAST   liraglutide 18 MG/3ML Sopn Inject 0.1 mLs (0.6 mg total) into the skin every morning. Check FBS daily and write it down   lisinopril 40 MG tablet Commonly known as:  PRINIVIL,ZESTRIL Take 1 tablet (40 mg total) by mouth daily.   OXYGEN Place 3 L into the nose continuous.   pantoprazole 40 MG tablet Commonly known as:   PROTONIX Take 1 tablet (40 mg total) by mouth daily.   sitaGLIPtin 100 MG tablet Commonly known as:  JANUVIA Take 1 tablet (100 mg total) by mouth daily.   solifenacin 5 MG tablet Commonly known as:  VESICARE Take 5 mg by mouth daily.   traZODone 100 MG tablet Commonly known as:  DESYREL Take 100 mg by mouth at bedtime.   Vitamin D (Ergocalciferol) 50000 units Caps capsule Commonly known as:  DRISDOL Take 1 capsule (50,000 Units total) by mouth every 7 (seven) days. What changed:  when to take this      Follow-up Information    [name], DO. Schedule an appointment as soon as possible for  a visit in 2 week(s).   Specialty:  Family Medicine Contact information: [id number] [place] [address] [phone]            The results of significant diagnostics from this hospitalization (including imaging, microbiology, ancillary and laboratory) are listed below for reference.    Significant Diagnostic Studies: Dg Chest 2 View  Result Date: 10/11/2016 CLINICAL DATA:  Chest pain and short of breath EXAM: CHEST  2 VIEW COMPARISON:  07/01/2015 FINDINGS: Right upper lobe scarring anteriorly. Elevated right hemidiaphragm with right lower lobe atelectasis. Negative for heart failure. Negative for pneumonia or effusion or mass lesion. IMPRESSION: No active cardiopulmonary disease. Electronically Signed   By: Franchot Gallo M.D.   On: 10/11/2016 19:12   Nm Myocar Multi W/spect W/wall Motion / Ef  Result Date: 10/12/2016  There was no ST segment deviation noted during stress.  No T wave inversion was noted during stress.  The study is normal. There is no ischemia . No evidence of previous MI  This is a low risk study.  The left ventricular ejection fraction is hyperdynamic (>65%).  Nuclear stress EF: 71%.     Microbiology: No results found for this or any previous visit (from the past 240 hour(s)).   Labs: Basic Metabolic Panel:  Recent Labs Lab  10/11/16 1833 10/12/16 0023  NA 140  --   K 4.0  --   CL 109  --   CO2 21*  --   GLUCOSE 225*  --   BUN 15  --   CREATININE 1.02* 1.18*  CALCIUM 9.1  --    Liver Function Tests: No results for input(s): AST, ALT, ALKPHOS, BILITOT, PROT, ALBUMIN in the last 168 hours. No results for input(s): LIPASE, AMYLASE in the last 168 hours. No results for input(s): AMMONIA in the last 168 hours. CBC:  Recent Labs Lab 10/11/16 1833 10/12/16 0023  WBC 6.8 7.4  HGB 10.6* 11.0*  HCT 33.4* 34.6*  MCV 87.2 87.8  PLT 204 218   Cardiac Enzymes:  Recent Labs Lab 10/12/16 0023 10/12/16 0534 10/12/16 1121  TROPONINI <0.03 <0.03 <0.03   BNP: BNP (last 3 results)  Recent Labs  02/14/16 1045  BNP 28.4    ProBNP (last 3 results) No results for input(s): PROBNP in the last 8760 hours.  CBG:  Recent Labs Lab 10/11/16 2246 10/12/16 0816 10/12/16 1119 10/12/16 1622  GLUCAP 159* 134* 219* 207*

## 2016-10-17 ENCOUNTER — Other Ambulatory Visit: Payer: Self-pay

## 2016-10-17 ENCOUNTER — Telehealth: Payer: Self-pay | Admitting: Family Medicine

## 2016-10-17 MED ORDER — SITAGLIPTIN PHOSPHATE 100 MG PO TABS: 100.0000 mg | ORAL_TABLET | Freq: Every day | ORAL | 0 refills | Status: DC

## 2016-10-17 NOTE — Telephone Encounter (Signed)
Januvia 100 mg one daily is on her current medication list. Refill sent.

## 2016-10-17 NOTE — Telephone Encounter (Signed)
Pt called states originally she was  Taking 1/2 tablet of sitaGLIPtin/ Januvia 100 MG 1x daily-- but increase to 1 full tablet a day and now is running out of mediciation-- Needs Rx refill called into pharmacy. Thanks, Baker Janus

## 2016-10-20 ENCOUNTER — Other Ambulatory Visit: Payer: Self-pay | Admitting: Family Medicine

## 2016-10-22 ENCOUNTER — Other Ambulatory Visit: Payer: Self-pay | Admitting: Gastroenterology

## 2016-10-23 NOTE — Telephone Encounter (Signed)
Yes we can refill. Please change prescription to #180 capsules RF#3. Thanks

## 2016-10-23 NOTE — Telephone Encounter (Signed)
Are you ok with refilling Colestid?

## 2016-10-25 ENCOUNTER — Telehealth: Payer: Self-pay | Admitting: Internal Medicine

## 2016-10-25 DIAGNOSIS — G4733 Obstructive sleep apnea (adult) (pediatric): Secondary | ICD-10-CM

## 2016-10-25 NOTE — Telephone Encounter (Signed)
Pt states she has not been able to obtain her cpap since the order that was placed back in May of 2017. Pt states [facility] would not give her the cpap machine due to her still pay for her oxygen that she has through them. Says they are now needing a new order for her cpap machine. [facility] is closed at this time so will contact them tomorrow for further information.

## 2016-10-26 NOTE — Telephone Encounter (Signed)
Spoke with [facility] - pt is in collections and they are unable to provide with new equipment of any type until this balance is paid or a payment plan set up. [facility] states that the patient needs to contact them to discuss this balance and they will see if they are able to work with her and also get her the new CPAP machine. New order placed for CPAP machine (resent the old order from May 2017) Pt aware. States that she has been making payments and has a payment plan set up. Pt to contact [facility] about order today. Nothing further needed.

## 2016-10-27 ENCOUNTER — Telehealth: Payer: Self-pay | Admitting: Internal Medicine

## 2016-10-27 NOTE — Telephone Encounter (Signed)
Spoke with pt. She is aware that she need an appointment in order to get a new CPAP machine. ROV has been scheduled with TP on 11/02/16 at 3pm. Nothing further was needed.

## 2016-11-02 ENCOUNTER — Ambulatory Visit: Payer: Medicare Other | Admitting: Adult Health

## 2016-11-06 ENCOUNTER — Ambulatory Visit: Payer: Medicare Other | Admitting: Adult Health

## 2016-11-06 ENCOUNTER — Other Ambulatory Visit: Payer: Self-pay | Admitting: Family Medicine

## 2016-11-07 ENCOUNTER — Telehealth: Payer: Self-pay | Admitting: Family Medicine

## 2016-11-07 ENCOUNTER — Other Ambulatory Visit: Payer: Self-pay | Admitting: Family Medicine

## 2016-11-07 MED ORDER — GEMFIBROZIL 600 MG PO TABS: 600.0000 mg | ORAL_TABLET | Freq: Two times a day (BID) | ORAL | 2 refills | Status: DC

## 2016-11-07 NOTE — Telephone Encounter (Signed)
Patient is requesting a refill of her gemfibrozil 600mg 

## 2016-11-07 NOTE — Telephone Encounter (Signed)
Pt cld 2 times today for Rx refill of gemfibrozil 600 MG (taken 2x's daily) --Kenney Houseman has forward msg to [facility] for refill approval--advised pt to allow 24 to 48 hr for refill acknowledgement @ pharmacy--glh

## 2016-11-07 NOTE — Addendum Note (Signed)
Addended by: Fonnie Mu on: 11/07/2016 11:28 AM   Modules accepted: Orders

## 2016-11-07 NOTE — Telephone Encounter (Signed)
We have not prescribed this medication for the patient before.  Please refill if appropriate.  Charyl Bigger, CMA

## 2016-11-09 ENCOUNTER — Encounter: Payer: Self-pay | Admitting: Adult Health

## 2016-11-09 ENCOUNTER — Ambulatory Visit (INDEPENDENT_AMBULATORY_CARE_PROVIDER_SITE_OTHER): Payer: Medicare Other | Admitting: Adult Health

## 2016-11-09 VITALS — BP 132/80 | HR 98 | Ht 64.0 in | Wt 185.9 lb

## 2016-11-09 DIAGNOSIS — R079 Chest pain, unspecified: Secondary | ICD-10-CM

## 2016-11-09 DIAGNOSIS — M549 Dorsalgia, unspecified: Secondary | ICD-10-CM

## 2016-11-09 DIAGNOSIS — R197 Diarrhea, unspecified: Secondary | ICD-10-CM | POA: Diagnosis not present

## 2016-11-09 DIAGNOSIS — S5001XA Contusion of right elbow, initial encounter: Secondary | ICD-10-CM | POA: Diagnosis not present

## 2016-11-09 MED ORDER — MELOXICAM 7.5 MG PO TABS: 7.5000 mg | ORAL_TABLET | Freq: Every day | ORAL | 0 refills | Status: AC

## 2016-11-09 NOTE — Progress Notes (Signed)
Subjective:    Patient ID: KIJANA ESTOCK, female    DOB: [birth year], 66 y.o.   MRN: [id number]  HPI:  Ms. Irion reports falling last week, however is unsure of day of the week.  She was walking across wet grass, slipped and struck an exposed root that caused her to fall back.  She struck the back of her head, low back/coccyx and right elbow.  She denies LOC, her son helped her to her feet and she was able to ambulate to the house. She did not seek medical care until now. Tetanus is UTD.  She denies fever/night sweats/purulent drainage from right elbow abrasion.  She reports low back/coccyx pain 7/10 that is constant, she is unable to describe the pain.  Pain radiates to both legs.  She also reports that "my legs have been hurting for years". She reports sitting/standing/walking for long periods increase pain.  Hot showered slightly reduce pain.  OTC Acetaminophen/Ibuprofen also only offer minimal pain relief.  Right elbow pain is 4/10 constant and burning.  She reports numbness/tingling in right hand/fingers, but this is chronic in nature. She is right hand dominant. Of Note: Hospital admission for CP 10/11/2016. D/c next day  "EKG chest x-ray and cardiac markers were unremarkable." She denies any cardiac sx's since d/c, she did f/u with Primary Care as directed.  This encounter was noticed by myself when reviewing her chart. She thinks CP was r/t GI upset and she continues to have chronic diarrhea.  She has not seen her GI specialist "awhile". Pt. Is poor historian.   Patient Care Team    Relationship Specialty Notifications Start End  [name], DO PCP - General Family Medicine  07/28/16   [name], DO  Family Medicine  03/10/16    Comment: Teddy Spike, [place] Physician Obstetrics and Gynecology  04/03/16   Landis Martins, DPM Consulting Physician Podiatry  04/03/16   [facility] Consulting Physician Orthopedic Surgery  04/03/16   Fay Records, MD  Consulting Physician Cardiology  04/03/16   Melvenia Beam, MD Consulting Physician Neurology  04/03/16   Inda Castle, MD Consulting Physician Gastroenterology  05/10/16    Comment: Hepatitis C  Manus Gunning, MD Consulting Physician Gastroenterology  05/12/16   [facility] Consulting Physician Urology  05/12/16   Collene Gobble, MD Consulting Physician Pulmonary Disease  07/01/16     Patient Active Problem List   Diagnosis Date Noted  . Back pain with radiation 11/09/2016  . Contusion of right elbow 11/09/2016  . Chest pain 10/11/2016  . Submucosal lesion of esophagus   . Microalbuminuric diabetic nephropathy 06/28/2016  . Diabetes mellitus due to underlying condition, controlled, with long-term current use of insulin ([place]) 05/10/2016  . Chronic lower back pain 05/10/2016  . Generalized OA 05/10/2016  . Obesity 03/18/2016  . COPD (chronic obstructive pulmonary disease) ([place]) 03/18/2016  . Chronic headaches ( migraineous)  03/18/2016  . CAD (coronary artery disease) 03/18/2016  . Aortic stenosis 03/18/2016  . History of genital warts 03/18/2016  . Chronic dryness of both eyes 03/18/2016  . Chronic kidney disease 03/08/2016  . Int Hemorrhoids: chronic blood in stool   . New onset of headaches after age 22 09/15/2015  . Snoring 09/15/2015  . Daytime hypersomnolence- OSA 09/15/2015  . 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 Bad memory 07/10/2015  . Mixed incontinence 07/10/2015  . Frailty 07/10/2015  . Altered mental state 07/01/2015  . Chronic hypoxic  respiratory failure:  home O2 (Drs [place] Carbo and Young) 08/04/2014  . Allergic rhinitis 01/22/2014  . Personal history of transient ischemic attack (TIA), and cerebral infarction without residual deficits 11/20/2013  . h/o Anemia 11/19/2013  . Problem situation relating to social and personal history 09/18/2013  . Polypharmacy 07/02/2012  . Other long term (current) drug therapy 07/02/2012  . h/o Acute  pulmonary embolism ([place]) 06/21/2012  . CHF (congestive heart failure) ([place]) 06/13/2012  . Fatty liver disease, nonalcoholic 38/45/3646  . Obstructive Sleep Apnea 09/08/2011  . Back pain, chronic 06/27/2011  . Depression, major, in remission ([place]) 06/27/2011  . Absence of bladder continence 06/27/2011  . Essential hypertension 06/27/2011  . Malaise and fatigue: Frailty, chronic deconditioning 11/08/2010  . Mixed hyperlipidemia 11/18/2009  . CAROTID ARTERY STENOSIS, WITHOUT INFARCTION 11/08/2009  . HYPERURICEMIA 07/29/2009  .  chronic DIZZINESS 11/18/2008  . h/o freq CANDIDIASIS, ORAL 08/25/2008  . Chronic Diarrhea 07/27/2008  . Polyuria 07/27/2008  . Chronic hepatitis C virus infection ([place]) 10/02/2007  . Esophageal reflux 10/02/2007  . Gastroparesis 10/02/2007  . Dysphagia 10/02/2007     Past Medical History:  Diagnosis Date  . Altered mental state 07/01/2015  . Anemia   . Anxiety   . Aortic stenosis    Echo 8/13: EF 55-60%, Mild LVH, mild AS, mean gradient 12 mmHg,   . Benign neoplasm of stomach   . CAD (coronary artery disease)    a.  cath 2005: LAD 40%;  b. Myoview 3/12: EF 70%, no ischemia;  c. Myoview 9/13: EF 76%, no ischemia  . CAROTID ARTERY STENOSIS, WITHOUT INFARCTION 11/08/2009   Overview:  Overview:   Last Assessment & Plan:  She needs follow up carotid dopplers.  I will arrange. Overview:  2005-40 - 59% stenosis of RICA.  May have had f/u study since then but couldn't find results in cardiology notes     . CHF (congestive heart failure) ([place]) 06-13-12   episode after Cancer surgery  . Chronic headaches   . Chronic kidney disease    "my kidneys have been about to shut down" (07/01/2015)  . Chronic lower back pain   . Complication of anesthesia   . COPD (chronic obstructive pulmonary disease) ([place])   . Depression, major, in remission ([place]) 06/27/2011   Overview:  Also dx'd as PTSD. Followed by [facility]   . Diabetes mellitus  without complication ([place])   . Dizziness and giddiness   . Esophageal reflux   . Family history of adverse reaction to anesthesia    sister had allergic reaction (unsure of reaction)  . Fibromyalgia   . Gastroparesis   . History of blood transfusion    "when I had my hemorrhoid OR"  . Macular degeneration   . Malaise and fatigue 11/08/2010   Overview:  Last Assessment & Plan:  She describes symptoms c/w sleep apnea.  I will send her for sleep medicine consultation.   . Mixed hyperlipidemia 11/18/2009   Qualifier: Diagnosis of  By: Mack Guise    . Mixed incontinence 07/10/2015  . Obesity 03/18/2016  . Obstructive apnea 09/08/2011   NPSG-02/28/11- AHI 10.8/ hr, desat to 77% on room air, body weight 184 lbs   . Ovarian cancer ([place]) 2000; 2002  . Personal history of transient ischemic attack (TIA), and cerebral infarction without residual deficits 11/20/2013  . PONV (postoperative nausea and vomiting)      Past Surgical History:  Procedure Laterality Date  . ABDOMINAL HYSTERECTOMY    .  ANTERIOR AND POSTERIOR VAGINAL REPAIR    . APPENDECTOMY    . BACK SURGERY    . BAND HEMORRHOIDECTOMY    . BREAST BIOPSY Left 1972  . CATARACT EXTRACTION W/ INTRAOCULAR LENS  IMPLANT, BILATERAL Bilateral 03/2013  . CHOLECYSTECTOMY OPEN    . COLONOSCOPY N/A 08/11/2013   Procedure: COLONOSCOPY;  Surgeon: Inda Castle, MD;  Location: [facility];  Service: Endoscopy;  Laterality: N/A;  . COLONOSCOPY N/A 12/08/2015   Procedure: COLONOSCOPY;  Surgeon: Manus Gunning, MD;  Location: [facility]OS[facility];  Service: Gastroenterology;  Laterality: N/A;  . DILATION AND CURETTAGE OF UTERUS    . ESOPHAGOGASTRODUODENOSCOPY N/A 08/01/2016   Procedure: ESOPHAGOGASTRODUODENOSCOPY (EGD);  Surgeon: Manus Gunning, MD;  Location: [facility];  Service: Gastroenterology;  Laterality: N/A;  . ESOPHAGOGASTRODUODENOSCOPY (EGD) WITH ESOPHAGEAL DILATION     Archie Endo 07/01/2015  . EUS N/A 09/07/2016   Procedure:  UPPER ENDOSCOPIC ULTRASOUND (EUS) RADIAL;  Surgeon: Milus Banister, MD;  Location: [facility];  Service: Endoscopy;  Laterality: N/A;  . KNEE ARTHROSCOPY Left   . LUMBAR DISC SURGERY    . OOPHORECTOMY     1 ovary removed, then later last ovary removed  . TONSILLECTOMY  ~ 1958  . TUBAL LIGATION       Family History  Problem Relation Age of Onset  . Brain cancer Father     spine cancer  . Heart disease Mother   . Hypertension Mother   . Stomach cancer Paternal Grandfather   . Breast cancer Sister   . Kidney disease Sister   . Cancer Sister     breast  . Hypertension Sister   . Diabetes Brother   . Esophageal cancer Brother   . Diabetes Maternal Aunt   . Diabetes Paternal Aunt   . Alcohol abuse Son   . Hyperlipidemia Son   . Hypertension Son   . Cancer Sister     breast  . Colon cancer Neg Hx      History  Drug Use No    Comment: 07/01/2015 "qut in 2000; tried pot, didn't enjoy it"     History  Alcohol Use No    Comment: 07/01/2015 "stopped  in before 2000"     History  Smoking Status  . Never Smoker  Smokeless Tobacco  . Never Used     Outpatient Encounter Prescriptions as of 11/09/2016  Medication Sig  . ARIPiprazole (ABILIFY) 5 MG tablet Take 5 mg by mouth daily.   [name] aspirin 325 MG tablet Take 325 mg by mouth daily.  [name] azelastine (ASTELIN) 0.1 % nasal spray 2 sprays each nostril four times daily for 3-5 days then twice a day (Patient taking differently: Place 2 sprays into both nostrils daily as needed for allergies. )  . bisoprolol (ZEBETA) 5 MG tablet TAKE ONE TABLET BY MOUTH ONCE DAILY  . canagliflozin (INVOKANA) 100 MG TABS tablet Take 1 tablet (100 mg total) by mouth daily before breakfast.  . colestipol (COLESTID) 1 g tablet Take 1 g by mouth daily.   . colestipol (COLESTID) 1 g tablet TAKE ONE TABLET BY MOUTH TWICE DAILY  . cycloSPORINE (RESTASIS) 0.05 % ophthalmic emulsion Place 1 drop into both eyes 2 (two) times daily.  . diclofenac  (VOLTAREN) 75 MG EC tablet TAKE 1 TABLET BY MOUTH TWICE DAILY  . DULoxetine (CYMBALTA) 30 MG capsule Take 1 capsule (30 mg total) by mouth daily.  [name] gabapentin (NEURONTIN) 300 MG capsule TAKE 2 CAPSULE BY MOUTH AT BREAKFAST, ONE  CAPSULE BY MOUTH AT LUNCH AND 2 CAPSULES BY MOUTH AFTER DINNER  . gemfibrozil (LOPID) 600 MG tablet Take 1 tablet (600 mg total) by mouth 2 (two) times daily before a meal.  . hydrOXYzine (VISTARIL) 25 MG capsule Take 25 mg by mouth every morning.  . Insulin Glargine (LANTUS SOLOSTAR) 100 UNIT/ML Solostar Pen Inject 20 Units into the skin daily at 10 pm.  . Liraglutide 18 MG/3ML SOPN Inject 0.1 mLs (0.6 mg total) into the skin every morning. Check FBS daily and write it down  . lisinopril (PRINIVIL,ZESTRIL) 40 MG tablet Take 1 tablet (40 mg total) by mouth daily.  . OXYGEN-HELIUM IN Place 3 L into the nose continuous.   . pantoprazole (PROTONIX) 40 MG tablet Take 1 tablet (40 mg total) by mouth daily.  . sitaGLIPtin (JANUVIA) 100 MG tablet Take 1 tablet (100 mg total) by mouth daily.  . solifenacin (VESICARE) 5 MG tablet Take 5 mg by mouth daily.   . traZODone (DESYREL) 100 MG tablet Take 100 mg by mouth at bedtime.  . Vitamin D, Ergocalciferol, (DRISDOL) 50000 units CAPS capsule Take 1 capsule (50,000 Units total) by mouth every 7 (seven) days. (Patient taking differently: Take 50,000 Units by mouth every Saturday. )  . amLODipine (NORVASC) 5 MG tablet Take 1 tablet (5 mg total) by mouth daily.  . meloxicam (MOBIC) 7.5 MG tablet Take 1 tablet (7.5 mg total) by mouth daily.   No facility-administered encounter medications on file as of 11/09/2016.     Allergies: Sunflower oil; Iodine; Metoclopramide hcl; Shellfish allergy; Sulfa antibiotics; and Metformin and related  Body mass index is 31.91 kg/m.  Blood pressure 132/80, pulse 98, height 5\' 4"  (1.626 m), weight 185 lb 14.4 oz (84.3 kg).   Review of Systems  Constitutional: Positive for activity change. Negative  for appetite change, chills, diaphoresis, fatigue, fever and unexpected weight change.  Eyes: Negative for visual disturbance.  Respiratory: Negative for cough, chest tightness, shortness of breath, wheezing and stridor.   Cardiovascular: Negative for chest pain, palpitations and leg swelling.  Gastrointestinal: Positive for diarrhea. Negative for abdominal distention, abdominal pain, blood in stool, constipation, nausea and vomiting.       Denies bowel/bladder dysfunction.  Endocrine: Negative for cold intolerance, heat intolerance, polydipsia, polyphagia and polyuria.  Genitourinary: Negative for difficulty urinating and flank pain.  Musculoskeletal: Positive for arthralgias, back pain, gait problem, joint swelling, myalgias, neck pain and neck stiffness.       Right elbow-burning rather than frank pain.  Skin: Negative for color change, pallor, rash and wound.  Neurological: Positive for dizziness, tremors, weakness and headaches.  Psychiatric/Behavioral: Positive for confusion. Negative for agitation, behavioral problems, self-injury and suicidal ideas.       Objective:   Physical Exam  Constitutional: She appears well-developed and well-nourished. No distress.  HENT:  Head: Normocephalic and atraumatic. Head is without abrasion, without contusion and without laceration. Hair is normal.  Eyes: Pupils are equal, round, and reactive to light.  Cardiovascular: Normal rate, regular rhythm, normal heart sounds and intact distal pulses.   Pulmonary/Chest: Effort normal and breath sounds normal. No respiratory distress. She has no wheezes. She has no rales. She exhibits no tenderness.  Musculoskeletal: She exhibits tenderness. She exhibits no deformity.       Right elbow: She exhibits swelling. She exhibits normal range of motion. No tenderness found. No radial head, no medial epicondyle, no lateral epicondyle and no olecranon process tenderness noted.       Lumbar  back: She exhibits  tenderness and pain. She exhibits normal range of motion, no swelling, no laceration and no spasm.  Right elbow: Two healing abrasions with scabs on lateral side. No excessive warmth, drainage,streaking noted. No tenderness with palpation or active ROM. Bil strong grip strengths. LLE weaker than RLE with active ROM. All distal pulses normal.   Neurological: She is alert. She displays normal reflexes. She exhibits normal muscle tone. Coordination normal.  Skin: Skin is warm and dry. No rash noted. She is not diaphoretic. No erythema. No pallor.  Psychiatric: She has a normal mood and affect. Judgment and thought content normal. She is slowed.  Nursing note and vitals reviewed.         Assessment & Plan:   1. Back pain with radiation   2. Diarrhea, unspecified type   3. Contusion of right elbow, initial encounter   4. Chest pain, unspecified type     Chest pain Hospital admission for CP-10/11/16. "EKG chest x-ray and cardiac markers were unremarkable." She denies any cardiac sx's since d/c. Continue all cardiac medications as directed.  Back pain with radiation  Place ice on right elbow and low back for 29mins several times daily. Be sure to place a barrier in between ice and skin, for example wash cloth. Meloxicam as directed. Orthopedic Specialist referral placed, specifically requests Dr. Phylliss Bob per pt. Request.  Spent > 30 mins with pt.  Majority of the time was spent counseling/coordinating care.  Contusion of right elbow  Place ice on right elbow and low back for 36mins several times daily. Be sure to place a barrier in between ice and skin, for example wash cloth. Meloxicam as directed. Orthopedic Specialist referral placed, specifically requests Dr. Phylliss Bob per pt. Request.      FOLLOW-UP:  Return if symptoms worsen or fail to improve.

## 2016-11-09 NOTE — Assessment & Plan Note (Signed)
Hospital admission for CP-10/11/16. "EKG chest x-ray and cardiac markers were unremarkable." She denies any cardiac sx's since d/c. Continue all cardiac medications as directed.

## 2016-11-09 NOTE — Assessment & Plan Note (Signed)
  Place ice on right elbow and low back for 71mins several times daily. Be sure to place a barrier in between ice and skin, for example wash cloth. Meloxicam as directed. Orthopedic Specialist referral placed, specifically requests Dr. Phylliss Bob per pt. Request.  Spent > 30 mins with pt.  Majority of the time was spent counseling/coordinating care.

## 2016-11-09 NOTE — Assessment & Plan Note (Signed)
  Place ice on right elbow and low back for 35mins several times daily. Be sure to place a barrier in between ice and skin, for example wash cloth. Meloxicam as directed. Orthopedic Specialist referral placed, specifically requests Dr. Phylliss Bob per pt. Request.

## 2016-11-09 NOTE — Patient Instructions (Addendum)
Back Pain, Adult Back pain is very common. The pain often gets better over time. The cause of back pain is usually not dangerous. Most people can learn to manage their back pain on their own. Follow these instructions at home: Watch your back pain for any changes. The following actions may help to lessen any pain you are feeling:  Stay active. Start with short walks on flat ground if you can. Try to walk farther each day.  Exercise regularly as told by your doctor. Exercise helps your back heal faster. It also helps avoid future injury by keeping your muscles strong and flexible.  Do not sit, drive, or stand in one place for more than 30 minutes.  Do not stay in bed. Resting more than 1-2 days can slow down your recovery.  Be careful when you bend or lift an object. Use good form when lifting:  Bend at your knees.  Keep the object close to your body.  Do not twist.  Sleep on a firm mattress. Lie on your side, and bend your knees. If you lie on your back, put a pillow under your knees.  Take medicines only as told by your doctor.  Put ice on the injured area.  Put ice in a plastic bag.  Place a towel between your skin and the bag.  Leave the ice on for 20 minutes, 2-3 times a day for the first 2-3 days. After that, you can switch between ice and heat packs.  Avoid feeling anxious or stressed. Find good ways to deal with stress, such as exercise.  Maintain a healthy weight. Extra weight puts stress on your back. Contact a doctor if:  You have pain that does not go away with rest or medicine.  You have worsening pain that goes down into your legs or buttocks.  You have pain that does not get better in one week.  You have pain at night.  You lose weight.  You have a fever or chills. Get help right away if:  You cannot control when you poop (bowel movement) or pee (urinate).  Your arms or legs feel weak.  Your arms or legs lose feeling (numbness).  You feel sick to  your stomach (nauseous) or throw up (vomit).  You have belly (abdominal) pain.  You feel like you may pass out (faint). This information is not intended to replace advice given to you by your health care provider. Make sure you discuss any questions you have with your health care provider. Document Released: 01/31/2008 Document Revised: 01/20/2016 Document Reviewed: 12/16/2013 Elsevier Interactive Patient Education  2017 [place] A contusion is a deep bruise. Contusions happen when an injury causes bleeding under the skin. Symptoms of bruising include pain, swelling, and discolored skin. The skin may turn blue, purple, or yellow. Follow these instructions at home:  Rest the injured area.  If told, put ice on the injured area.  Put ice in a plastic bag.  Place a towel between your skin and the bag.  Leave the ice on for 20 minutes, 2-3 times per day.  If told, put light pressure (compression) on the injured area using an elastic bandage. Make sure the bandage is not too tight. Remove it and put it back on as told by your doctor.  If possible, raise (elevate) the injured area above the level of your heart while you are sitting or lying down.  Take over-the-counter and prescription medicines only as told by your doctor.  Contact a doctor if:  Your symptoms do not get better after several days of treatment.  Your symptoms get worse.  You have trouble moving the injured area. Get help right away if:  You have very bad pain.  You have a loss of feeling (numbness) in a hand or foot.  Your hand or foot turns pale or cold. This information is not intended to replace advice given to you by your health care provider. Make sure you discuss any questions you have with your health care provider. Document Released: 01/31/2008 Document Revised: 01/20/2016 Document Reviewed: 12/30/2014 Elsevier Interactive Patient Education  2017 [place] ice on right elbow  and low back for 72mins several times daily. Be sure to place a barrier in between ice and skin, for example wash cloth. Meloxicam as directed. Orthopedic Specialist referral placed, specifically requests Dr. Phylliss Bob per pt. Request.  Since no new cardiac symptoms since hospital discharge-continue all medications as directed. Please follow-up with your GI specialist for continued diarrhea. Please call clinic with any questions/concerns.

## 2016-11-10 ENCOUNTER — Telehealth: Payer: Self-pay | Admitting: Gastroenterology

## 2016-11-10 NOTE — Telephone Encounter (Signed)
Patient is having increased diarrhea, taking colestipol BID, Imodium AD four times daily. Still having reflux on Protonix 40 mg daily, uses Zofran for nausea prn. Denies any blood in stool, has not had any recent antibiotic, no fever. Made appointment to see APP next week. Please advise.

## 2016-11-13 ENCOUNTER — Other Ambulatory Visit: Payer: Self-pay

## 2016-11-13 DIAGNOSIS — R197 Diarrhea, unspecified: Secondary | ICD-10-CM

## 2016-11-13 NOTE — Telephone Encounter (Signed)
Thanks for the note.  She had been doing really well on Colestid. I see she was hospitalized a few weeks ago for a separate issue. We should send stools for C Diff to ensure negative. Otherwise she can double the dose of Colestid in the interim and see if this helps. Thanks

## 2016-11-13 NOTE — Telephone Encounter (Signed)
Spoke to patient, let her know to do stool specimen for c diff. Instructed to increase dosage of Colestid and keep her appointment with APP on 3/22. Asked that she bring the stool sample in today or early tomorrow so that we have some results.

## 2016-11-13 NOTE — Telephone Encounter (Signed)
Please see phone note, I did not hear back from Dr. Fuller Plan. Thanks.

## 2016-11-14 ENCOUNTER — Other Ambulatory Visit: Payer: Medicare Other

## 2016-11-14 ENCOUNTER — Ambulatory Visit (INDEPENDENT_AMBULATORY_CARE_PROVIDER_SITE_OTHER): Payer: Medicare Other | Admitting: Adult Health

## 2016-11-14 ENCOUNTER — Encounter: Payer: Self-pay | Admitting: Adult Health

## 2016-11-14 DIAGNOSIS — G4733 Obstructive sleep apnea (adult) (pediatric): Secondary | ICD-10-CM

## 2016-11-14 DIAGNOSIS — J9611 Chronic respiratory failure with hypoxia: Secondary | ICD-10-CM

## 2016-11-14 NOTE — Progress Notes (Signed)
@Patient  ID: [name] [name], female    DOB: April 02, 1951, 66 y.o.   MRN: [id number]  Chief Complaint  Patient presents with  . Follow-up    OSA     Referring provider: [name], DO  HPI: 66 yo female never smoker followed for mild OSA and Chronic hypoxemic respiratory failure on O2 3l/m (restrictive disease without interstitial disease ) .    TEST  NPSG-02/28/11- AHI 10.8/ hr, desat to 77% on room air, body weight 184 lbs CPAP Advanced 9 cwp on AirView  11/14/2016 Follow up ; OSA  Pt presents for follow up for sleep apnea . She was previously on CPAP but has not worn for couple of years because it got broken when she moved. Would like to get started back on CPAP  machine. She is tired during daytime and does not sleep well at night.  + snoring.  We discussed repeat sleep study .     Allergies  Allergen Reactions  . Sunflower Oil Anaphylaxis  . Iodine Nausea And Vomiting    Fish causes n/v  . Metoclopramide Hcl Other (See Comments)    Nerves tense up, panic attack  . Shellfish Allergy Nausea And Vomiting  . Sulfa Antibiotics Nausea And Vomiting  . Metformin And Related Diarrhea    Immunization History  Administered Date(s) Administered  . Hep A / Hep B 07/28/2016, 08/29/2016  . Influenza Split 05/28/2013  . Influenza,inj,Quad PF,36+ Mos 06/28/2016  . Influenza-Unspecified 06/17/2015  . Pneumococcal Polysaccharide-23 08/29/2011, 06/01/2015  . Tdap 02/06/2005, 06/01/2015  . Zoster 01/25/2016    Past Medical History:  Diagnosis Date  . Altered mental state 07/01/2015  . Anemia   . Anxiety   . Aortic stenosis    Echo 8/13: EF 55-60%, Mild LVH, mild AS, mean gradient 12 mmHg,   . Benign neoplasm of stomach   . CAD (coronary artery disease)    a.  cath 2005: LAD 40%;  b. Myoview 3/12: EF 70%, no ischemia;  c. Myoview 9/13: EF 76%, no ischemia  . CAROTID ARTERY STENOSIS, WITHOUT INFARCTION 11/08/2009   Overview:  Overview:   Last Assessment & Plan:  She needs  follow up carotid dopplers.  I will arrange. Overview:  2005-40 - 59% stenosis of RICA.  May have had f/u study since then but couldn't find results in cardiology notes     . CHF (congestive heart failure) ([place]) 06-13-12   episode after Cancer surgery  . Chronic headaches   . Chronic kidney disease    "my kidneys have been about to shut down" (07/01/2015)  . Chronic lower back pain   . Complication of anesthesia   . COPD (chronic obstructive pulmonary disease) ([place])   . Depression, major, in remission ([place]) 06/27/2011   Overview:  Also dx'd as PTSD. Followed by [facility] ([facility])   . Diabetes mellitus without complication ([place])   . Dizziness and giddiness   . Esophageal reflux   . Family history of adverse reaction to anesthesia    sister had allergic reaction (unsure of reaction)  . Fibromyalgia   . Gastroparesis   . History of blood transfusion    "when I had my hemorrhoid OR"  . Macular degeneration   . Malaise and fatigue 11/08/2010   Overview:  Last Assessment & Plan:  She describes symptoms c/w sleep apnea.  I will send her for sleep medicine consultation.   . Mixed hyperlipidemia 11/18/2009   Qualifier: Diagnosis of  By: Mack Guise    .  Mixed incontinence 07/10/2015  . Obesity 03/18/2016  . Obstructive apnea 09/08/2011   NPSG-02/28/11- AHI 10.8/ hr, desat to 77% on room air, body weight 184 lbs   . Ovarian cancer ([place]) 2000; 2002  . Personal history of transient ischemic attack (TIA), and cerebral infarction without residual deficits 11/20/2013  . PONV (postoperative nausea and vomiting)     Tobacco History: History  Smoking Status  . Never Smoker  Smokeless Tobacco  . Never Used   Counseling given: Not Answered   Outpatient Encounter Prescriptions as of 11/14/2016  Medication Sig  . amLODipine (NORVASC) 5 MG tablet Take 1 tablet (5 mg total) by mouth daily.  . ARIPiprazole (ABILIFY) 5 MG tablet Take 5 mg by mouth daily.   [name] aspirin  325 MG tablet Take 325 mg by mouth daily.  [name] azelastine (ASTELIN) 0.1 % nasal spray 2 sprays each nostril four times daily for 3-5 days then twice a day (Patient taking differently: Place 2 sprays into both nostrils daily as needed for allergies. )  . bisoprolol (ZEBETA) 5 MG tablet TAKE ONE TABLET BY MOUTH ONCE DAILY  . canagliflozin (INVOKANA) 100 MG TABS tablet Take 1 tablet (100 mg total) by mouth daily before breakfast.  . colestipol (COLESTID) 1 g tablet Take 1 g by mouth daily.   . colestipol (COLESTID) 1 g tablet TAKE ONE TABLET BY MOUTH TWICE DAILY  . cycloSPORINE (RESTASIS) 0.05 % ophthalmic emulsion Place 1 drop into both eyes 2 (two) times daily.  . diclofenac (VOLTAREN) 75 MG EC tablet TAKE 1 TABLET BY MOUTH TWICE DAILY  . DULoxetine (CYMBALTA) 30 MG capsule Take 1 capsule (30 mg total) by mouth daily.  [name] gabapentin (NEURONTIN) 300 MG capsule TAKE 2 CAPSULE BY MOUTH AT BREAKFAST, ONE CAPSULE BY MOUTH AT LUNCH AND 2 CAPSULES BY MOUTH AFTER DINNER  . gemfibrozil (LOPID) 600 MG tablet Take 1 tablet (600 mg total) by mouth 2 (two) times daily before a meal.  . hydrOXYzine (VISTARIL) 25 MG capsule Take 25 mg by mouth every morning.  . Insulin Glargine (LANTUS SOLOSTAR) 100 UNIT/ML Solostar Pen Inject 20 Units into the skin daily at 10 pm.  . Liraglutide 18 MG/3ML SOPN Inject 0.1 mLs (0.6 mg total) into the skin every morning. Check FBS daily and write it down  . lisinopril (PRINIVIL,ZESTRIL) 40 MG tablet Take 1 tablet (40 mg total) by mouth daily.  . meloxicam (MOBIC) 7.5 MG tablet Take 1 tablet (7.5 mg total) by mouth daily.  . OXYGEN-HELIUM IN Place 3 L into the nose continuous.   . pantoprazole (PROTONIX) 40 MG tablet Take 1 tablet (40 mg total) by mouth daily.  . sitaGLIPtin (JANUVIA) 100 MG tablet Take 1 tablet (100 mg total) by mouth daily.  . solifenacin (VESICARE) 5 MG tablet Take 5 mg by mouth daily.   . traZODone (DESYREL) 100 MG tablet Take 100 mg by mouth at bedtime.  .  Vitamin D, Ergocalciferol, (DRISDOL) 50000 units CAPS capsule Take 1 capsule (50,000 Units total) by mouth every 7 (seven) days. (Patient taking differently: Take 50,000 Units by mouth every Saturday. )   No facility-administered encounter medications on file as of 11/14/2016.      Review of Systems  Constitutional:   No  weight loss, night sweats,  Fevers, chills,  +fatigue, or  lassitude.  HEENT:   No headaches,  Difficulty swallowing,  Tooth/dental problems, or  Sore throat,  No sneezing, itching, ear ache, nasal congestion, post nasal drip,   CV:  No chest pain,  Orthopnea, PND, swelling in lower extremities, anasarca, dizziness, palpitations, syncope.   GI  No heartburn, indigestion, abdominal pain, nausea, vomiting, diarrhea, change in bowel habits, loss of appetite, bloody stools.   Resp:   No chest wall deformity  Skin: no rash or lesions.  GU: no dysuria, change in color of urine, no urgency or frequency.  No flank pain, no hematuria   MS:  No joint pain or swelling.  No decreased range of motion.  No back pain.    Physical Exam  BP 124/68 (BP Location: Left Arm, Cuff Size: Normal)   Pulse 94   Ht 5\' 4"  (1.626 m)   Wt 184 lb 3.2 oz (83.6 kg)   LMP  (LMP Unknown)   SpO2 94%   BMI 31.62 kg/m   GEN: A/Ox3; pleasant , NAD, elderly , on O2    HEENT:  [place]/AT,  EACs-clear, TMs-wnl, NOSE-clear, THROAT-clear, no lesions, no postnasal drip or exudate noted. Class 3 MP airway   NECK:  Supple w/ fair ROM; no JVD; normal carotid impulses w/o bruits; no thyromegaly or nodules palpated; no lymphadenopathy.    RESP  Clear  P & A; w/o, wheezes/ rales/ or rhonchi. no accessory muscle use, no dullness to percussion  CARD:  RRR, no m/r/g, no peripheral edema, pulses intact, no cyanosis or clubbing.  GI:   Soft & nt; nml bowel sounds; no organomegaly or masses detected.   Musco: Warm bil, no deformities or joint swelling noted.   Neuro: alert, no focal deficits  noted.    Skin: Warm, no lesions or rashes    Lab Results:  CBC   BNPl Imaging: No results found.   Assessment & Plan:   Obstructive Sleep Apnea Hx of mild OSA , off CPAP >51yr  Will need new sleep study to restart on CPAP   Plan  Patient Instructions  Set up for home sleep study .  Continue on Oxygen 3l/m .  follow up Dr. Annamaria Boots  In 6 -8 weeks and As needed       Chronic hypoxic respiratory failure:  home O2 (Drs West Carbo and Annamaria Boots) Cont on O2 3l/m      Rexene Edison, NP 11/14/2016

## 2016-11-14 NOTE — Assessment & Plan Note (Signed)
Cont on O2 3l/m  

## 2016-11-14 NOTE — Assessment & Plan Note (Signed)
Hx of mild OSA , off CPAP >73yr  Will need new sleep study to restart on CPAP   Plan  Patient Instructions  Set up for home sleep study .  Continue on Oxygen 3l/m .  follow up Dr. Annamaria Boots  In 6 -8 weeks and As needed

## 2016-11-14 NOTE — Patient Instructions (Signed)
Set up for home sleep study .  Continue on Oxygen 3l/m .  follow up Dr. Annamaria Boots  In 6 -8 weeks and As needed

## 2016-11-14 NOTE — Addendum Note (Signed)
Addended by: Parke Poisson E on: 11/14/2016 04:46 PM   Modules accepted: Orders

## 2016-11-14 NOTE — Addendum Note (Signed)
Addended by: Parke Poisson E on: 11/14/2016 05:40 PM   Modules accepted: Orders

## 2016-11-14 NOTE — Addendum Note (Signed)
Addended by: Parke Poisson E on: 11/14/2016 04:56 PM   Modules accepted: Orders

## 2016-11-15 ENCOUNTER — Ambulatory Visit (INDEPENDENT_AMBULATORY_CARE_PROVIDER_SITE_OTHER): Payer: Medicare Other | Admitting: Nurse Practitioner

## 2016-11-15 ENCOUNTER — Encounter: Payer: Self-pay | Admitting: Nurse Practitioner

## 2016-11-15 VITALS — BP 122/78 | Ht 64.0 in | Wt 183.0 lb

## 2016-11-15 DIAGNOSIS — K529 Noninfective gastroenteritis and colitis, unspecified: Secondary | ICD-10-CM | POA: Diagnosis not present

## 2016-11-15 NOTE — Patient Instructions (Addendum)
If you are age 65 or older, your body mass index should be between 23-30. Your Body mass index is 31.41 kg/m. If this is out of the aforementioned range listed, please consider follow up with your Primary Care Provider.  If you are age 16 or younger, your body mass index should be between 19-25. Your Body mass index is 31.41 kg/m. If this is out of the aformentioned range listed, please consider follow up with your Primary Care Provider.   If no bowel movement by tomorrow evening,cut Colestid back to one twice a day.  If the diarrhea returns, submit stool for C.Diff  Thank you for choosing [facility]

## 2016-11-16 NOTE — Progress Notes (Signed)
Agree with assessment and plan as outlined.  

## 2016-11-16 NOTE — Progress Notes (Signed)
HPI: Patient is a 66 year old female known to Dr. Havery Moros. She has COPD on home O2, coronary artery disease and diabetes. She has chronic diarrhea with negative workup in the past including random colon biopsies and celiac studies. She was started on Colestid which normalized her stools until a couple of months ago when she developed loose stools numerous times a day but not every day. On some days she has no bowel movement at all. No recent antibiotics. No recent medication changes. Patient was hospitalized mid February with chest pain. She was ruled out for coronary event. Diarrhea was present prior to hospital admission. She has been taking Imodium 4 times a day . Patient called the office a few days ago which time her Colestid was doubled to 2 twice a day. Patient advised to submit a stool study for C. difficile and follow-up in the office.  After increasing Colestid patient had no more diarrhea to submit for a stool study. She feels fine without abdominal pain. She never did see any blood in her stool. No fevers. No further chest pains for which she was recently hospitalized    Past Medical History:  Diagnosis Date  . Altered mental state 07/01/2015  . Anemia   . Anxiety   . Aortic stenosis    Echo 8/13: EF 55-60%, Mild LVH, mild AS, mean gradient 12 mmHg,   . Benign neoplasm of stomach   . CAD (coronary artery disease)    a.  cath 2005: LAD 40%;  b. Myoview 3/12: EF 70%, no ischemia;  c. Myoview 9/13: EF 76%, no ischemia  . CAROTID ARTERY STENOSIS, WITHOUT INFARCTION 11/08/2009   Overview:  Overview:   Last Assessment & Plan:  She needs follow up carotid dopplers.  I will arrange. Overview:  2005-40 - 59% stenosis of RICA.  May have had f/u study since then but couldn't find results in cardiology notes     . CHF (congestive heart failure) ([place]) 06-13-12   episode after Cancer surgery  . Chronic headaches   . Chronic kidney disease    "my kidneys have been about to shut down"  (07/01/2015)  . Chronic lower back pain   . Complication of anesthesia   . COPD (chronic obstructive pulmonary disease) ([place])   . Depression, major, in remission ([place]) 06/27/2011   Overview:  Also dx'd as PTSD. Followed by [facility] ([facility])   . Diabetes mellitus without complication ([place])   . Dizziness and giddiness   . Esophageal reflux   . Family history of adverse reaction to anesthesia    sister had allergic reaction (unsure of reaction)  . Fibromyalgia   . Gastroparesis   . History of blood transfusion    "when I had my hemorrhoid OR"  . Macular degeneration   . Malaise and fatigue 11/08/2010   Overview:  Last Assessment & Plan:  She describes symptoms c/w sleep apnea.  I will send her for sleep medicine consultation.   . Mixed hyperlipidemia 11/18/2009   Qualifier: Diagnosis of  By: Mack Guise    . Mixed incontinence 07/10/2015  . Obesity 03/18/2016  . Obstructive apnea 09/08/2011   NPSG-02/28/11- AHI 10.8/ hr, desat to 77% on room air, body weight 184 lbs   . Ovarian cancer ([place]) 2000; 2002  . Personal history of transient ischemic attack (TIA), and cerebral infarction without residual deficits 11/20/2013  . PONV (postoperative nausea and vomiting)     Patient's surgical history, family medical history, social  history, medications and allergies were all reviewed in Epic    Physical Exam: BP 122/78   Ht 5\' 4"  (1.626 m)   Wt 183 lb (83 kg)   LMP  (LMP Unknown)   BMI 31.41 kg/m   GENERAL: pleasant well developed white female in NAD PSYCH: :Pleasant, cooperative, normal affect EENT:  conjunctiva pink, mucous membranes moist, neck supple without masses CARDIAC:  RRR, no peripheral edema PULM: Normal respiratory effort, lungs CTA bilaterally, no wheezing ABDOMEN:  soft, nontender, nondistended, no obvious masses,   normal bowel sounds SKIN:  turgor, no lesions seen NEURO: Alert and oriented x 3, no focal neurologic deficits   ASSESSMENT  and PLAN:  66 year old female with chronic diarrhea and negative workup in past. Maintained on Colestid. Here with recent exacerbation of diarrhea of unclear etiology.  -Diarrhea has resolved after doubling dose of Colestid a few days ago (telephone call). Unable to submit stool for c-diff now but will do so if diarrhea recurs.  -No BM yesterday nor today. If no BM by tomorrow afternoon  patient will hold Colestid until she does have a BM. Following that she will then resume original dose of one BID. She has titrated Colestid dose in the past but feels most comfortable talking with [place] before dosage changes.    Tye Savoy , NP 11/16/2016, 4:09 PM

## 2016-11-18 DIAGNOSIS — M48061 Spinal stenosis, lumbar region without neurogenic claudication: Secondary | ICD-10-CM | POA: Diagnosis not present

## 2016-11-20 ENCOUNTER — Other Ambulatory Visit: Payer: Self-pay | Admitting: Internal Medicine

## 2016-11-20 MED ORDER — LISINOPRIL 40 MG PO TABS: 40.0000 mg | ORAL_TABLET | Freq: Every day | ORAL | 6 refills | Status: DC

## 2016-11-21 ENCOUNTER — Other Ambulatory Visit: Payer: Self-pay

## 2016-11-21 ENCOUNTER — Other Ambulatory Visit: Payer: Medicare Other

## 2016-11-21 DIAGNOSIS — R197 Diarrhea, unspecified: Secondary | ICD-10-CM

## 2016-11-23 ENCOUNTER — Other Ambulatory Visit: Payer: Self-pay | Admitting: Family Medicine

## 2016-11-23 DIAGNOSIS — Z794 Long term (current) use of insulin: Principal | ICD-10-CM

## 2016-11-23 DIAGNOSIS — E0800 Diabetes mellitus due to underlying condition with hyperosmolarity without nonketotic hyperglycemic-hyperosmolar coma (NKHHC): Secondary | ICD-10-CM

## 2016-11-25 DIAGNOSIS — M545 Low back pain: Secondary | ICD-10-CM | POA: Diagnosis not present

## 2016-11-29 LAB — HM DIABETES EYE EXAM

## 2016-12-01 ENCOUNTER — Telehealth: Payer: Self-pay | Admitting: Gastroenterology

## 2016-12-01 ENCOUNTER — Other Ambulatory Visit: Payer: Medicare Other

## 2016-12-01 ENCOUNTER — Other Ambulatory Visit: Payer: Self-pay

## 2016-12-01 NOTE — Telephone Encounter (Signed)
Okay sorry to hear this, thanks for letting me know, we will let her know the results.

## 2016-12-01 NOTE — Telephone Encounter (Signed)
Spoke to patient this morning, she did drop off a stool sample last week. I called the lab to see where the results are. Apparently the lab lost the sample. [name] [name] in the lab called patient to come drop off another specimen, as she is still having diarrhea.

## 2016-12-03 ENCOUNTER — Other Ambulatory Visit: Payer: Self-pay | Admitting: Adult Health

## 2016-12-04 ENCOUNTER — Other Ambulatory Visit: Payer: Medicare Other

## 2016-12-05 ENCOUNTER — Other Ambulatory Visit: Payer: Medicare Other

## 2016-12-05 DIAGNOSIS — K921 Melena: Secondary | ICD-10-CM

## 2016-12-08 DIAGNOSIS — M5416 Radiculopathy, lumbar region: Secondary | ICD-10-CM | POA: Diagnosis not present

## 2016-12-11 LAB — C DIFFICILE, CYTOTOXIN B

## 2016-12-11 LAB — C DIFFICILE TOXINS A+B W/RFLX: C DIFFICILE TOXINS A+B, EIA: NEGATIVE

## 2016-12-18 ENCOUNTER — Ambulatory Visit: Payer: Medicare Other | Admitting: Emergency Medicine

## 2016-12-18 ENCOUNTER — Other Ambulatory Visit: Payer: Self-pay | Admitting: Adult Health

## 2016-12-18 NOTE — Telephone Encounter (Signed)
I am unsure whether to refill this medication.  Please review and refill if appropriate.  Charyl Bigger, CMA

## 2016-12-19 MED ORDER — DICLOFENAC SODIUM 75 MG PO TBEC: 75.0000 mg | DELAYED_RELEASE_TABLET | Freq: Two times a day (BID) | ORAL | 1 refills | Status: DC

## 2016-12-19 NOTE — Addendum Note (Signed)
Addended by: [name] on: 12/19/2016 11:57 AM   Modules accepted: Orders

## 2016-12-19 NOTE — Telephone Encounter (Signed)
This medication was prescribed by Lillard Anes on 11/07/16.  Charyl Bigger, CMA

## 2016-12-25 ENCOUNTER — Ambulatory Visit: Payer: Medicare Other | Admitting: Adult Health

## 2016-12-27 ENCOUNTER — Telehealth: Payer: Self-pay

## 2016-12-27 NOTE — Telephone Encounter (Signed)
Pt due for final twinrix 07/28/17. Sent myself a staff message to schedule nurse visit and to contact pt to inform.

## 2016-12-27 NOTE — Telephone Encounter (Signed)
-----   Message from Wilder sent at 08/29/2016  8:38 AM EST ----- Due for #3 standard twinrix; #2 given 08/29/2016

## 2016-12-29 DIAGNOSIS — M47816 Spondylosis without myelopathy or radiculopathy, lumbar region: Secondary | ICD-10-CM | POA: Diagnosis not present

## 2017-01-01 ENCOUNTER — Ambulatory Visit: Payer: Medicare Other | Admitting: Internal Medicine

## 2017-01-01 ENCOUNTER — Telehealth: Payer: Self-pay | Admitting: Family Medicine

## 2017-01-01 DIAGNOSIS — Z794 Long term (current) use of insulin: Secondary | ICD-10-CM

## 2017-01-01 DIAGNOSIS — E118 Type 2 diabetes mellitus with unspecified complications: Secondary | ICD-10-CM

## 2017-01-01 MED ORDER — ONETOUCH ULTRASOFT LANCETS MISC: 12 refills | Status: DC

## 2017-01-01 MED ORDER — ONETOUCH ULTRA SYSTEM W/DEVICE KIT: PACK | 0 refills | Status: DC

## 2017-01-01 NOTE — Telephone Encounter (Signed)
Pt called to ask if [facility] or her Health Ins has faxed an authorization for her to get a new " One Touch Ultra"-- Pt states has either lost or someone took her kit and she has been Without testing for 4 dys now--Please call her back-glh

## 2017-01-02 ENCOUNTER — Other Ambulatory Visit: Payer: Self-pay

## 2017-01-02 ENCOUNTER — Other Ambulatory Visit: Payer: Self-pay | Admitting: Adult Health

## 2017-01-02 ENCOUNTER — Ambulatory Visit (HOSPITAL_BASED_OUTPATIENT_CLINIC_OR_DEPARTMENT_OTHER): Payer: Medicare Other | Attending: Adult Health | Admitting: Pulmonary Disease

## 2017-01-02 ENCOUNTER — Other Ambulatory Visit: Payer: Self-pay | Admitting: Family Medicine

## 2017-01-02 DIAGNOSIS — E119 Type 2 diabetes mellitus without complications: Secondary | ICD-10-CM

## 2017-01-02 DIAGNOSIS — G4733 Obstructive sleep apnea (adult) (pediatric): Secondary | ICD-10-CM

## 2017-01-02 DIAGNOSIS — J9611 Chronic respiratory failure with hypoxia: Secondary | ICD-10-CM

## 2017-01-02 DIAGNOSIS — Z794 Long term (current) use of insulin: Principal | ICD-10-CM

## 2017-01-02 MED ORDER — ONETOUCH VERIO FLEX SYSTEM W/DEVICE KIT: 1.0000 | PACK | Freq: Once | 0 refills | Status: AC

## 2017-01-02 MED ORDER — GLUCOSE BLOOD VI STRP: ORAL_STRIP | 12 refills | Status: DC

## 2017-01-02 NOTE — Progress Notes (Signed)
Pt called stating that her insurance company will not cover One Touch Ultra and needs RX for One Touch Verio.  RXs for meter and test strips sent to pharmacy.  Charyl Bigger, CMA

## 2017-01-08 ENCOUNTER — Telehealth: Payer: Self-pay | Admitting: Adult Health

## 2017-01-08 DIAGNOSIS — J9611 Chronic respiratory failure with hypoxia: Secondary | ICD-10-CM

## 2017-01-08 NOTE — Telephone Encounter (Signed)
No significant obstructive sleep apnea occurred during this study (AHI = 0.8/h).- The study was conducted with her using baseline 3 liters oxygen with SpO2 low of 92%. No Sleep apnea Is present. Does not need to wear CPAP .   Continue 3 liters supplemental oxygen at night Keep follow up as planned. If daytime sleepiness persists will need further evaluation

## 2017-01-08 NOTE — Procedures (Signed)
   Patient Name: Emanuel, Campos Date: [birth year] Gender: Female D.O.B: 08/11/1951 Age (years): 65 Referring Provider: Lynelle Smoke Parrett Height (inches): 64 Interpreting Physician: Chesley Mires MD, ABSM Weight (lbs): 186 RPSGT: Madelon Lips BMI: 32 MRN: [id number] Neck Size: 16.00  CLINICAL INFORMATION Sleep Study Type: NPSG  Indication for sleep study: OSA  Epworth Sleepiness Score: 16  SLEEP STUDY TECHNIQUE As per the AASM Manual for the Scoring of Sleep and Associated Events v2.3 (April 2016) with a hypopnea requiring 4% desaturations.  The channels recorded and monitored were frontal, central and occipital EEG, electrooculogram (EOG), submentalis EMG (chin), nasal and oral airflow, thoracic and abdominal wall motion, anterior tibialis EMG, snore microphone, electrocardiogram, and pulse oximetry.  MEDICATIONS Medications self-administered by patient taken the night of the study : N/A  SLEEP ARCHITECTURE The study was initiated at 11:19:47 PM and ended at 5:48:51 AM.  Sleep onset time was 43.0 minutes and the sleep efficiency was 77.4%. The total sleep time was 301.0 minutes.  Stage REM latency was 216.5 minutes.  The patient spent 6.98% of the night in stage N1 sleep, 55.48% in stage N2 sleep, 0.00% in stage N3 and 37.54% in REM.  Alpha intrusion was absent.  Supine sleep was 20.27%.  RESPIRATORY PARAMETERS The overall apnea/hypopnea index (AHI) was 0.8 per hour. There were 4 total apneas, including 1 obstructive, 2 central and 1 mixed apneas. There were 0 hypopneas and 0 RERAs.  The AHI during Stage REM sleep was 2.1 per hour.  AHI while supine was 0.0 per hour.  The mean oxygen saturation was 94.70%. The minimum SpO2 during sleep was 92.00%.  Loud snoring was noted during this study.  CARDIAC DATA The 2 lead EKG demonstrated sinus rhythm. The mean heart rate was 87.16 beats per minute. Other EKG findings include: None.  LEG MOVEMENT DATA The total PLMS  were 0 with a resulting PLMS index of 0.00. Associated arousal with leg movement index was 0.0 .  IMPRESSIONS - No significant obstructive sleep apnea occurred during this study (AHI = 0.8/h). - No significant central sleep apnea occurred during this study (CAI = 0.4/h). - The study was conducted with her using baseline 3 liters oxygen with SpO2 low of 92%.  DIAGNOSIS - Chronic hypoxic respiratory failure (J96.11)  RECOMMENDATIONS - Based on current test results she does not qualify for CPAP therapy. - Continue 3 liters supplemental oxygen at night.  [Electronically signed] 01/08/2017 03:35 PM  Chesley Mires MD, [place], American Board of Sleep Medicine   NPI: [id number]

## 2017-01-09 ENCOUNTER — Other Ambulatory Visit: Payer: Self-pay | Admitting: Adult Health

## 2017-01-11 NOTE — Telephone Encounter (Signed)
Pt is aware of results and voiced her understanding. Nothing further needed.  

## 2017-01-11 NOTE — Telephone Encounter (Signed)
Patient called for sleep study results - phone call got disconnected - pt phone # [phone] -pr

## 2017-01-15 ENCOUNTER — Other Ambulatory Visit: Payer: Self-pay | Admitting: Adult Health

## 2017-01-15 ENCOUNTER — Other Ambulatory Visit: Payer: Self-pay | Admitting: Family Medicine

## 2017-01-15 DIAGNOSIS — Z794 Long term (current) use of insulin: Principal | ICD-10-CM

## 2017-01-15 DIAGNOSIS — E0800 Diabetes mellitus due to underlying condition with hyperosmolarity without nonketotic hyperglycemic-hyperosmolar coma (NKHHC): Secondary | ICD-10-CM

## 2017-01-15 NOTE — Telephone Encounter (Signed)
Please call patient to schedule f/u with Dr. Raliegh Scarlet.  No more refills until patient is seen.  Charyl Bigger, CMA

## 2017-01-17 ENCOUNTER — Other Ambulatory Visit: Payer: Self-pay | Admitting: Family Medicine

## 2017-01-24 ENCOUNTER — Ambulatory Visit (INDEPENDENT_AMBULATORY_CARE_PROVIDER_SITE_OTHER): Payer: Medicare Other | Admitting: Family Medicine

## 2017-01-24 ENCOUNTER — Encounter: Payer: Self-pay | Admitting: Family Medicine

## 2017-01-24 ENCOUNTER — Other Ambulatory Visit: Payer: Self-pay | Admitting: Family Medicine

## 2017-01-24 VITALS — BP 108/68 | HR 94 | Ht 64.0 in | Wt 172.0 lb

## 2017-01-24 DIAGNOSIS — E6609 Other obesity due to excess calories: Secondary | ICD-10-CM

## 2017-01-24 DIAGNOSIS — Z794 Long term (current) use of insulin: Secondary | ICD-10-CM

## 2017-01-24 DIAGNOSIS — R197 Diarrhea, unspecified: Secondary | ICD-10-CM | POA: Diagnosis not present

## 2017-01-24 DIAGNOSIS — E782 Mixed hyperlipidemia: Secondary | ICD-10-CM | POA: Diagnosis not present

## 2017-01-24 DIAGNOSIS — E1121 Type 2 diabetes mellitus with diabetic nephropathy: Secondary | ICD-10-CM

## 2017-01-24 DIAGNOSIS — E0865 Diabetes mellitus due to underlying condition with hyperglycemia: Secondary | ICD-10-CM

## 2017-01-24 DIAGNOSIS — E66811 Obesity, class 1: Secondary | ICD-10-CM

## 2017-01-24 LAB — POCT GLYCOSYLATED HEMOGLOBIN (HGB A1C): HEMOGLOBIN A1C: 7.7

## 2017-01-24 MED ORDER — INSULIN GLARGINE 100 UNIT/ML SOLOSTAR PEN: 30.0000 [IU] | PEN_INJECTOR | Freq: Every day | SUBCUTANEOUS | 99 refills | Status: DC

## 2017-01-24 NOTE — Patient Instructions (Addendum)
Please check your blood pressures at home especially if you are feeling funny at all.  Keep a log and write it down.  Bring in next office visit.  Also please note that your cardiologist Dr. Harrington Challenger wanted to see you in January after she added the amlodipine.  Please call for a follow-up appointment with her.  Please bring in a log of your blood pressures to that office visit.     Please try to drink more water especially if you're diarrhea symptoms are not well controlled.  You should be be drinking at least half of your weight in ounces of water per day.  That's roughly 85 ounces of water a day.  Please try not to drink Kool-Aid with all that sugar in it.---> Also please call your gastroenterologist about your ongoing GI symptoms that are not well controlled.    Also going up on your Lantus from 20 units at night to 30.  Please note that it's important you don't skip meals and always have a protein bar fiber one bar handy in case sure out and about and U you're not eating      Insulin Glargine injection What is this medicine? INSULIN GLARGINE (IN su lin GLAR geen) is a human-made form of insulin. This drug lowers the amount of sugar in your blood. It is a long-acting insulin that is usually given once a day. This medicine may be used for other purposes; ask your health care provider or pharmacist if you have questions. COMMON BRAND NAME(S): BASAGLAR, Lantus, Lantus SoloStar, Toujeo SoloStar What should I tell my health care provider before I take this medicine? They need to know if you have any of these conditions: -episodes of low blood sugar -kidney disease -liver disease -an unusual or allergic reaction to insulin, metacresol, other medicines, foods, dyes, or preservatives -pregnant or trying to get pregnant -breast-feeding How should I use this medicine? This medicine is for injection under the skin. Use this medicine at the same time each day. Use exactly as directed. This insulin should  never be mixed in the same syringe with other insulins before injection. Do not vigorously shake before use. You will be taught how to use this medicine and how to adjust doses for activities and illness. Do not use more insulin than prescribed. Always check the appearance of your insulin before using it. This medicine should be clear and colorless like water. Do not use it if it is cloudy, thickened, colored, or has solid particles in it. It is important that you put your used needles and syringes in a special sharps container. Do not put them in a trash can. If you do not have a sharps container, call your pharmacist or healthcare provider to get one. Talk to your pediatrician regarding the use of this medicine in children. Special care may be needed. Overdosage: If you think you have taken too much of this medicine contact a poison control center or emergency room at once. NOTE: This medicine is only for you. Do not share this medicine with others. What if I miss a dose? It is important not to miss a dose. Your health care professional or doctor should discuss a plan for missed doses with you. If you do miss a dose, follow their plan. Do not take double doses. What may interact with this medicine? -other medicines for diabetes Many medications may cause changes in blood sugar, these include: -alcohol containing beverages -antiviral medicines for HIV or AIDS -aspirin and aspirin-like  drugs -certain medicines for blood pressure, heart disease, irregular heart beat -chromium -diuretics -female hormones, such as estrogens or progestins, birth control pills -fenofibrate -gemfibrozil -isoniazid -lanreotide -female hormones or anabolic steroids -MAOIs like Carbex, Eldepryl, Marplan, Nardil, and Parnate -medicines for weight loss -medicines for allergies, asthma, cold, or cough -medicines for depression, anxiety, or psychotic disturbances -niacin -nicotine -NSAIDs, medicines for pain and  inflammation, like ibuprofen or naproxen -octreotide -pasireotide -pentamidine -phenytoin -probenecid -quinolone antibiotics such as ciprofloxacin, levofloxacin, ofloxacin -some herbal dietary supplements -steroid medicines such as prednisone or cortisone -sulfamethoxazole; trimethoprim -thyroid hormones Some medications can hide the warning symptoms of low blood sugar (hypoglycemia). You may need to monitor your blood sugar more closely if you are taking one of these medications. These include: -beta-blockers, often used for high blood pressure or heart problems (examples include atenolol, metoprolol, propranolol) -clonidine -guanethidine -reserpine This list may not describe all possible interactions. Give your health care provider a list of all the medicines, herbs, non-prescription drugs, or dietary supplements you use. Also tell them if you smoke, drink alcohol, or use illegal drugs. Some items may interact with your medicine. What should I watch for while using this medicine? Visit your health care professional or doctor for regular checks on your progress. Do not drive, use machinery, or do anything that needs mental alertness until you know how this medicine affects you. Alcohol may interfere with the effect of this medicine. Avoid alcoholic drinks. A test called the HbA1C (A1C) will be monitored. This is a simple blood test. It measures your blood sugar control over the last 2 to 3 months. You will receive this test every 3 to 6 months. Learn how to check your blood sugar. Learn the symptoms of low and high blood sugar and how to manage them. Always carry a quick-source of sugar with you in case you have symptoms of low blood sugar. Examples include hard sugar candy or glucose tablets. Make sure others know that you can choke if you eat or drink when you develop serious symptoms of low blood sugar, such as seizures or unconsciousness. They must get medical help at once. Tell your  doctor or health care professional if you have high blood sugar. You might need to change the dose of your medicine. If you are sick or exercising more than usual, you might need to change the dose of your medicine. Do not skip meals. Ask your doctor or health care professional if you should avoid alcohol. Many nonprescription cough and cold products contain sugar or alcohol. These can affect blood sugar. Make sure that you have the right kind of syringe for the type of insulin you use. Try not to change the brand and type of insulin or syringe unless your health care professional or doctor tells you to. Switching insulin brand or type can cause dangerously high or low blood sugar. Always keep an extra supply of insulin, syringes, and needles on hand. Use a syringe one time only. Throw away syringe and needle in a closed container to prevent accidental needle sticks. Insulin pens and cartridges should never be shared. Even if the needle is changed, sharing may result in passing of viruses like hepatitis or HIV. Wear a medical ID bracelet or chain, and carry a card that describes your disease and details of your medicine and dosage times. What side effects may I notice from receiving this medicine? Side effects that you should report to your doctor or health care professional as soon as possible: -allergic  reactions like skin rash, itching or hives, swelling of the face, lips, or tongue -breathing problems -signs and symptoms of high blood sugar such as dizziness, dry mouth, dry skin, fruity breath, nausea, stomach pain, increased hunger or thirst, increased urination -signs and symptoms of low blood sugar such as feeling anxious, confusion, dizziness, increased hunger, unusually weak or tired, sweating, shakiness, cold, irritable, headache, blurred vision, fast heartbeat, loss of consciousness Side effects that usually do not require medical attention (report to your doctor or health care professional if  they continue or are bothersome): -increase or decrease in fatty tissue under the skin due to overuse of a particular injection site -itching, burning, swelling, or rash at site where injected This list may not describe all possible side effects. Call your doctor for medical advice about side effects. You may report side effects to FDA at 1-800-FDA-1088. Where should I keep my medicine? Keep out of the reach of children. Store unopened vials in a refrigerator between 2 and 8 degrees C (36 and 46 degrees F). Do not freeze or use if the insulin has been frozen. Opened vials (vials currently in use) may be stored in the refrigerator or at room temperature, at approximately 25 degrees C (77 degrees F) or cooler. Keeping your insulin at room temperature decreases the amount of pain during injection. Once opened, your insulin can be used for 28 days. After 28 days, the vial should be thrown away. Store Lantus [employer], mining in a refrigerator between 2 and 8 degrees C (36 and 46 degrees F) or at room temperature below 30 degrees C (86 degrees F). Do not freeze or use if the insulin has been frozen. Once opened, the pens should be kept at room temperature. Do not store in the refrigerator once opened. Once opened, the insulin can be used for 28 days. After 28 days, the Lantus Solostar Pen or Basaglar KwikPen should be thrown away. Store [employer] in a refrigerator between 2 and 8 degrees C (36 and 46 degrees F). Do not freeze or use if the insulin has been frozen. Once opened, the pens should be kept at room temperature below 30 degrees C (86 degrees F). Do not store in the refrigerator once opened. Once opened, the insulin can be used for 42 days. After 42 days, the [employer] Pen should be thrown away. Protect from light and excessive heat. Throw away any unused medicine after the expiration date or after the specified time for room temperature storage has passed. NOTE: This  sheet is a summary. It may not cover all possible information. If you have questions about this medicine, talk to your doctor, pharmacist, or health care provider.  2018 Elsevier/Gold Standard (2016-08-30 10:26:25)

## 2017-01-24 NOTE — Progress Notes (Signed)
Impression and Recommendations:    1. Diabetes mellitus due to underlying condition, controlled, with hyperglycemia, with long-term current use of insulin ([place])   2. Diarrhea, unspecified type   3. Mixed hyperlipidemia   4. Class 1 obesity due to excess calories without serious comorbidity in adult, unspecified BMI   5. Microalbuminuric diabetic nephropathy    INstructions given to pt:  - Per patient, she wasn't sure what fasting blood sugars and blood pressures should be, even though we have had this conversation many, many times.  We reviewed all of these today.  Handouts were provided.  - BP: Goal is to be consistently under 130 or less on top and 80 or less on the bottom. Please check it at home and write it down and bring your blood pressures in next office visit for my review.   Please check your blood pressures at home especially if you are feeling funny at all.  Keep a log and write it down.  Bring in next office visit.  Also please note that your cardiologist Dr. Harrington Challenger wanted to see you in January after she added the amlodipine.  Please call for a follow-up appointment with her.  Please bring in a log of your blood pressures to that office visit.     - And then the blood sugars just like you're doing continue to check the fasting in the 2 hours after largest meal. Also remember if you ever feel bad check your sugars  Please try to drink more water especially if you're diarrhea symptoms are not well controlled.  You should be be drinking at least half of your weight in ounces of water per day.  That's roughly 85 ounces of water a day.   - Please try not to drink Kool-Aid with all that sugar in it.---> Also please call your gastroenterologist about your ongoing GI symptoms that are not well controlled.    -  Also we are going up on your Lantus from 20 units at night to 30.  Please note that it's important you don't skip meals and always have a protein bar fiber one bar handy in  case sure out and about and U you're not eating  The patient was counseled, risk factors were discussed, anticipatory guidance given.  Gross side effects, risk and benefits, and alternatives of medications and treatment plan in general discussed with patient.  Patient is aware that all medications have potential side effects and we are unable to predict every side effect or drug-drug interaction that may occur.   Patient will call with any questions prior to using medication if they have concerns.  Expresses verbal understanding and consents to current therapy and treatment regimen.  No barriers to understanding were identified.  Red flag symptoms and signs discussed in detail.  Patient expressed understanding regarding what to do in case of emergency\urgent symptoms  Return for diabetes follow up every 3 mo; will need urine microalb.  Please see AVS handed out to patient at the end of our visit for further patient instructions/ counseling done pertaining to today's office visit.    Note: This document was prepared using Dragon voice recognition software and may include unintentional dictation errors.   --------------------------------------------------------------------------------------------------------------------------------------------------------------------------------------------------------------------------------------------    Subjective:    CC:  Chief Complaint  Patient presents with  . Diabetes  . Gastroesophageal Reflux    pantoprazole not helping    HPI: RANITA STJULIEN is a 66 y.o. female who presents to [facility] today for issues as discussed below.    ----> Here b/c last OV we started pt on victoza and also her BP was up- so reck on that as well.    HTN;    BP elevated last couple OV's. Much improved today b/c she was seen by her Cards doc and Dr Harrington Challenger started pt on 2.5 amlodipine.   Tol well, no S-E; denies feet swelling- Has checked  occ and has been running higher than today in general.  Denies Sx   DM:    Victoza, januvia, invokana and lantus 20units q hs--> BS running fasting 224- 434;  2 hr PP- runnning about the same - not eating helathy at all- a lot of carbs.   10/10/16-->   A1c was 7.4.    A1c today 7.7-->  Lab Results  Component Value Date   HGBA1C 7.7 01/24/2017   HGBA1C 7.4 10/10/2016   HGBA1C 7.1 05/10/2016     Mood:  Been under more stress lately- very stressed.  Turmoil btwn her D-in-L and her, she lives with them.   Doesn't feel welcomed.  UNable to exercise or eat healthy- not taking care of herself.      Denies depression/ anxiety.  She has psychiatrist---doesn't recall name of Doc    Depression screen [facility] 2/9 01/24/2017  Decreased Interest 2  Down, Depressed, Hopeless 3  PHQ - 2 Score 5  Altered sleeping 2  Tired, decreased energy 2  Change in appetite 2  Feeling bad or failure about yourself  2  Trouble concentrating 3  Moving slowly or fidgety/restless 3  Suicidal thoughts 2  PHQ-9 Score 21  Some recent data might be hidden   GAD 7 : Generalized Anxiety Score 01/24/2017  Nervous, Anxious, on Edge 2  Control/stop worrying 2  Worry too much - different things 2  Trouble relaxing 2  Restless 2  Easily annoyed or irritable 3  Afraid - awful might happen 2  Total GAD 7 Score 15  Anxiety Difficulty Somewhat difficult      Wt Readings from Last 3 Encounters:  02/13/17 173 lb (78.5 kg)  01/24/17 172 lb (78 kg)  01/02/17 186 lb (84.4 kg)   BP Readings from Last 3 Encounters:  02/13/17 104/68  01/24/17 108/68  11/15/16 122/78   Pulse Readings from Last 3 Encounters:  02/13/17 90  01/24/17 94  11/14/16 94   BMI Readings from Last 3 Encounters:  02/13/17 29.70 kg/m  01/24/17 29.52 kg/m  01/02/17 31.93 kg/m     Patient Care Team    Relationship Specialty Notifications Start End  [name], DO PCP - General Family Medicine  07/28/16   [name], DO   Family Medicine  03/10/16    Comment: Teddy Spike, DO Consulting Physician Obstetrics and Gynecology  04/03/16   Landis Martins, DPM Consulting Physician Podiatry  04/03/16   Pa, [place] Physician Orthopedic Surgery  04/03/16   Fay Records, MD Consulting Physician Cardiology  04/03/16   Melvenia Beam, MD Consulting Physician Neurology  04/03/16   Inda Castle, MD Consulting Physician Gastroenterology  05/10/16    Comment: Retta Mac Carlota Raspberry, MD Consulting Physician Gastroenterology  05/12/16   Pa, [facility] Consulting Physician Urology  05/12/16   Collene Gobble, MD Consulting Physician Pulmonary Disease  07/01/16     Patient Active Problem List   Diagnosis Date Noted  . Diabetes mellitus due to  underlying condition, controlled, with long-term current use of insulin ([place]) 05/10/2016    Priority: High  . Obesity 03/18/2016    Priority: High  . Chronic kidney disease 03/08/2016    Priority: High  . Essential hypertension 06/27/2011    Priority: High  . Chronic Diarrhea 07/27/2008    Priority: High  . COPD (chronic obstructive pulmonary disease) ([place]) 03/18/2016    Priority: Medium  . CAD (coronary artery disease) 03/18/2016    Priority: Medium  . Aortic stenosis 03/18/2016    Priority: Medium  . Daytime hypersomnolence- OSA 09/15/2015    Priority: Medium  . Bad memory 07/10/2015    Priority: Medium  . Chronic hypoxic respiratory failure:  home O2 (Drs West Carbo and Young) 08/04/2014    Priority: Medium  . Personal history of transient ischemic attack (TIA), and cerebral infarction without residual deficits 11/20/2013    Priority: Medium  . h/o Acute pulmonary embolism ([place]) 06/21/2012    Priority: Medium  . Obstructive Sleep Apnea 09/08/2011    Priority: Medium  . Back pain, chronic 06/27/2011    Priority: Medium  . Depression, major, in remission ([place]) 06/27/2011    Priority: Medium  . Mixed  hyperlipidemia 11/18/2009    Priority: Medium  . CAROTID ARTERY STENOSIS, WITHOUT INFARCTION 11/08/2009    Priority: Medium  .  chronic DIZZINESS 11/18/2008    Priority: Medium  . Chronic hepatitis C virus infection ([place]) 10/02/2007    Priority: Medium  . Esophageal reflux 10/02/2007    Priority: Medium  . Chronic lower back pain 05/10/2016    Priority: Low  . Generalized OA 05/10/2016    Priority: Low  . Mixed incontinence 07/10/2015    Priority: Low  . h/o freq CANDIDIASIS, ORAL 08/25/2008    Priority: Low  . Back pain with radiation 11/09/2016  . Contusion of right elbow 11/09/2016  . Chest pain 10/11/2016  . Submucosal lesion of esophagus   . Microalbuminuric diabetic nephropathy 06/28/2016  . Chronic headaches ( migraineous)  03/18/2016  . History of genital warts 03/18/2016  . Chronic dryness of both eyes 03/18/2016  . Int Hemorrhoids: chronic blood in stool   . New onset of headaches after age 79 09/15/2015  . Snoring 09/15/2015  . 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 Frailty 07/10/2015  . Altered mental state 07/01/2015  . Allergic rhinitis 01/22/2014  . h/o Anemia 11/19/2013  . Problem situation relating to social and personal history 09/18/2013  . Polypharmacy 07/02/2012  . Other long term (current) drug therapy 07/02/2012  . CHF (congestive heart failure) ([place]) 06/13/2012  . Fatty liver disease, nonalcoholic 01/56/1537  . Absence of bladder continence 06/27/2011  . Malaise and fatigue: Frailty, chronic deconditioning 11/08/2010  . HYPERURICEMIA 07/29/2009  . Polyuria 07/27/2008  . Gastroparesis 10/02/2007  . Dysphagia 10/02/2007    Past Medical history, Surgical history, Family history, Social history, Allergies and Medications have been entered into the medical record, reviewed and changed as needed.   Allergies:  Allergies  Allergen Reactions  . Sunflower Oil Anaphylaxis  . Iodine Nausea And Vomiting    Fish causes n/v  . Metoclopramide Hcl  Other (See Comments)    Nerves tense up, panic attack  . Shellfish Allergy Nausea And Vomiting  . Sulfa Antibiotics Nausea And Vomiting  . Metformin And Related Diarrhea    Review of Systems  Constitutional: Negative.  Negative for chills, diaphoresis, fever, malaise/fatigue and weight loss.  HENT: Negative.  Negative for congestion, sore throat and tinnitus.   Eyes:  Negative.  Negative for blurred vision, double vision and photophobia.  Respiratory: Negative.  Negative for cough and wheezing.   Cardiovascular: Negative for chest pain.  Gastrointestinal: Negative.  Negative for blood in stool, diarrhea, nausea and vomiting.  Genitourinary: Negative.  Negative for dysuria, frequency and urgency.  Musculoskeletal: Negative.  Negative for joint pain and myalgias.  Skin: Negative.  Negative for itching and rash.  Neurological: Negative.  Negative for dizziness, focal weakness, weakness and headaches.  Endo/Heme/Allergies: Negative.  Negative for environmental allergies and polydipsia. Does not bruise/bleed easily.  Psychiatric/Behavioral: Negative.  Negative for depression and memory loss. The patient is not nervous/anxious and does not have insomnia.      Objective:   Blood pressure 108/68, pulse 94, height 5\' 4"  (1.626 m), weight 172 lb (78 kg), SpO2 95 %. Body mass index is 29.52 kg/m. General: Well Developed, well nourished, appropriate for stated age.  Neuro: Alert and oriented x3, extra-ocular muscles intact, sensation grossly intact.  HEENT: Normocephalic, atraumatic, neck supple, no bruits apprec   Skin: Warm and dry, no gross rash. Cardiac: RRR, S1 S2,  no murmurs rubs or gallops.  Respiratory: ECTA B/L, Not using accessory muscles, speaking in full sentences-unlabored. Vascular:  No gross lower ext edema, cap RF less 2 sec. Psych: No HI/SI, judgement and insight good, Euthymic mood. Full Affect.

## 2017-01-29 ENCOUNTER — Ambulatory Visit (INDEPENDENT_AMBULATORY_CARE_PROVIDER_SITE_OTHER): Payer: Medicare Other | Admitting: Gastroenterology

## 2017-01-29 DIAGNOSIS — Z23 Encounter for immunization: Secondary | ICD-10-CM

## 2017-01-30 ENCOUNTER — Ambulatory Visit: Payer: Medicare Other | Admitting: Nurse Practitioner

## 2017-01-30 DIAGNOSIS — M47816 Spondylosis without myelopathy or radiculopathy, lumbar region: Secondary | ICD-10-CM | POA: Diagnosis not present

## 2017-02-06 ENCOUNTER — Other Ambulatory Visit: Payer: Self-pay | Admitting: Adult Health

## 2017-02-11 ENCOUNTER — Other Ambulatory Visit: Payer: Self-pay | Admitting: Family Medicine

## 2017-02-12 DIAGNOSIS — M4696 Unspecified inflammatory spondylopathy, lumbar region: Secondary | ICD-10-CM | POA: Diagnosis not present

## 2017-02-12 DIAGNOSIS — M545 Low back pain: Secondary | ICD-10-CM | POA: Diagnosis not present

## 2017-02-13 ENCOUNTER — Ambulatory Visit (INDEPENDENT_AMBULATORY_CARE_PROVIDER_SITE_OTHER): Payer: Medicare Other | Admitting: Nurse Practitioner

## 2017-02-13 ENCOUNTER — Encounter: Payer: Self-pay | Admitting: Nurse Practitioner

## 2017-02-13 ENCOUNTER — Other Ambulatory Visit (INDEPENDENT_AMBULATORY_CARE_PROVIDER_SITE_OTHER): Payer: Medicare Other

## 2017-02-13 VITALS — BP 104/68 | HR 90 | Ht 64.0 in | Wt 173.0 lb

## 2017-02-13 DIAGNOSIS — R300 Dysuria: Secondary | ICD-10-CM

## 2017-02-13 DIAGNOSIS — K529 Noninfective gastroenteritis and colitis, unspecified: Secondary | ICD-10-CM

## 2017-02-13 DIAGNOSIS — K219 Gastro-esophageal reflux disease without esophagitis: Secondary | ICD-10-CM | POA: Diagnosis not present

## 2017-02-13 LAB — URINALYSIS, ROUTINE W REFLEX MICROSCOPIC
BILIRUBIN URINE: NEGATIVE
KETONES UR: NEGATIVE
NITRITE: POSITIVE — AB
Specific Gravity, Urine: 1.02 (ref 1.000–1.030)
UROBILINOGEN UA: 0.2 (ref 0.0–1.0)
Urine Glucose: >=1000 — AB
pH: 6 (ref 5.0–8.0)

## 2017-02-13 MED ORDER — COLESTIPOL HCL 1 G PO TABS: 2.0000 g | ORAL_TABLET | Freq: Three times a day (TID) | ORAL | 2 refills | Status: AC

## 2017-02-13 NOTE — Progress Notes (Signed)
HPI: Patient is a 66 year old female known to Dr. Havery Moros. She has COPD,  diabetes, CAD, and history of congestive heart failure. She has chronic diarrhea with negative workup in the past including celiac studies and a colonoscopy mid April 2017 I saw her late March for worsening diarrhea. The diarrhea had resolved after doubling Colestid dose a few days prior to our visit.  C-diff in April was negative.  Patient is back with complaints of diarrhea which she says never improved. I reminded her of our last visit when she told me that the diarrhea had improved. She does not have a bowel movement every day, maybe 3 times a week but stools are loose. She takes Imodium, 2 after the first loose stool then 1 after each subsequent stool up to 4 doses daily. She is currently taking Colestid twice a day. Diarrhea is not associated with any cramping but she does have urgency, often unable to make it to the bathroom in time. Additionally she sometimes wakes up to find the bed soiled.    Dorlene is taking Protonix twice a day but still has belching and heartburn. She is taking the PPI after meals. She goes to bed on an empty stomach   Past Medical History:  Diagnosis Date  . Altered mental state 07/01/2015  . Anemia   . Anxiety   . Aortic stenosis    Echo 8/13: EF 55-60%, Mild LVH, mild AS, mean gradient 12 mmHg,   . Benign neoplasm of stomach   . CAD (coronary artery disease)    a.  cath 2005: LAD 40%;  b. Myoview 3/12: EF 70%, no ischemia;  c. Myoview 9/13: EF 76%, no ischemia  . CAROTID ARTERY STENOSIS, WITHOUT INFARCTION 11/08/2009   Overview:  Overview:   Last Assessment & Plan:  She needs follow up carotid dopplers.  I will arrange. Overview:  2005-40 - 59% stenosis of RICA.  May have had f/u study since then but couldn't find results in cardiology notes     . CHF (congestive heart failure) ([place]) 06-13-12   episode after Cancer surgery  . Chronic headaches   . Chronic kidney disease    "my  kidneys have been about to shut down" (07/01/2015)  . Chronic lower back pain   . Complication of anesthesia   . COPD (chronic obstructive pulmonary disease) ([place])   . Depression, major, in remission ([place]) 06/27/2011   Overview:  Also dx'd as PTSD. Followed by [facility] ([facility])   . Diabetes mellitus without complication ([place])   . Dizziness and giddiness   . Esophageal reflux   . Family history of adverse reaction to anesthesia    sister had allergic reaction (unsure of reaction)  . Fibromyalgia   . Gastroparesis   . History of blood transfusion    "when I had my hemorrhoid OR"  . Macular degeneration   . Malaise and fatigue 11/08/2010   Overview:  Last Assessment & Plan:  She describes symptoms c/w sleep apnea.  I will send her for sleep medicine consultation.   . Mixed hyperlipidemia 11/18/2009   Qualifier: Diagnosis of  By: Mack Guise    . Mixed incontinence 07/10/2015  . Obesity 03/18/2016  . Obstructive apnea 09/08/2011   NPSG-02/28/11- AHI 10.8/ hr, desat to 77% on room air, body weight 184 lbs   . Ovarian cancer ([place]) 2000; 2002  . Personal history of transient ischemic attack (TIA), and cerebral infarction without residual deficits 11/20/2013  .  PONV (postoperative nausea and vomiting)     Patient's surgical history, family medical history, social history, medications and allergies were all reviewed in Epic    Physical Exam: BP 104/68   Pulse 90   Ht 5\' 4"  (1.626 m)   Wt 173 lb (78.5 kg)   LMP  (LMP Unknown)   BMI 29.70 kg/m   GENERAL: overweight white female in NAD PSYCH: : flat affect, cooperative EENT:  conjunctiva pink, mucous membranes moist, neck supple without masses CARDIAC:  RRR, murmur present, no peripheral edema PULM: Normal respiratory effort, lungs CTA bilaterally, no wheezing ABDOMEN:  soft, nontender, nondistended, no obvious masses, no hepatomegaly,  normal bowel sounds SKIN:  turgor, no lesions seen Musculoskeletal:   Normal muscle tone, normal strength NEURO: Alert and oriented x 3, no focal neurologic deficits    ASSESSMENT and PLAN:  34. 66 year old female with chronic diarrhea. Negative workup including colonoscopy with random biopsies and negative celiac studies.  Though stools are always loose she doesn't go every day. On average she has loose stool about three days a week but with urgency and some episodes of incontinence. She also has passive incontinence with bed soiling at night.  Incontinence could be secondary to decreased sphincter tone / sensation related to diabetes. Etiology of loose stools not clear. IBS ? Diabetic enteropathy / small bowel bacterial overgrowth?  Stool not oily and no weight loss suggesting pancreatic insuffiencey.  -negative for C. difficile  in mid April when evaluated for worsening diarrhea. Since bowel habits about the same will not ask her to repeat stool test. .  -Increase Colestid to 3 times a day -Continue Imodium as directed -If increasing Colestid doesn't help will consider empirical trial of xifaxan or flagyl for SIBO.   2. Dysuria.  -Will obtain u/a and if abnormal will send urine for culture.   3. GERD. Breakthrough heartburn on twice a day PPI. -GERD literature given -Advised to change the timing of her PPI to 30 minutes before breakfast and dinner, she has been taking it after meals  Tye Savoy , NP 02/13/2017, 2:37 PM

## 2017-02-13 NOTE — Patient Instructions (Addendum)
If you are age 65 or older, your body mass index should be between 23-30. Your Body mass index is 29.7 kg/m. If this is out of the aforementioned range listed, please consider follow up with your Primary Care Provider.  If you are age 55 or younger, your body mass index should be between 19-25. Your Body mass index is 29.7 kg/m. If this is out of the aformentioned range listed, please consider follow up with your Primary Care Provider.   Your physician has requested that you go to the basement for the following lab work before leaving today: UA w/reflex  We have sent the following medications to your pharmacy for you to pick up at your convenience: Colestid - take three times daily  Take Protonix before a meal.  You have been given GERD Literature.  Thank you for choosing me and [facility].   Tye Savoy, NP

## 2017-02-15 NOTE — Progress Notes (Signed)
Agree with assessment and plan with the following changes.  Why don't we go ahead and give her a 2 week course of rifaximin 550mg  TID for 2 weeks. I don't think increasing colestid is likely to provide a significant benefit, but she can do this as well. We could also consider placing her on a daily fiber supplement to bulk stools and provide more regularity. If these measures don't provide improvement in a few weeks she should contact us. Thanks

## 2017-02-16 ENCOUNTER — Telehealth: Payer: Self-pay | Admitting: Family Medicine

## 2017-02-16 NOTE — Telephone Encounter (Signed)
Patient sates she has a UTI and is requesting an antibiotic for it.

## 2017-02-18 ENCOUNTER — Other Ambulatory Visit: Payer: Self-pay | Admitting: Family Medicine

## 2017-02-19 ENCOUNTER — Telehealth: Payer: Self-pay | Admitting: Internal Medicine

## 2017-02-19 ENCOUNTER — Other Ambulatory Visit: Payer: Self-pay | Admitting: *Deleted

## 2017-02-19 ENCOUNTER — Other Ambulatory Visit: Payer: Self-pay

## 2017-02-19 DIAGNOSIS — J449 Chronic obstructive pulmonary disease, unspecified: Secondary | ICD-10-CM

## 2017-02-19 DIAGNOSIS — J9611 Chronic respiratory failure with hypoxia: Secondary | ICD-10-CM

## 2017-02-19 MED ORDER — DICLOFENAC SODIUM 1 % TD GEL: 1.0000 "application " | Freq: Four times a day (QID) | TRANSDERMAL | 6 refills | Status: AC

## 2017-02-19 MED ORDER — LISINOPRIL 40 MG PO TABS: 40.0000 mg | ORAL_TABLET | Freq: Every day | ORAL | 0 refills | Status: AC

## 2017-02-19 MED ORDER — NITROFURANTOIN MONOHYD MACRO 100 MG PO CAPS: 100.0000 mg | ORAL_CAPSULE | Freq: Two times a day (BID) | ORAL | 0 refills | Status: AC

## 2017-02-19 NOTE — Addendum Note (Signed)
Addended by: Amado Coe on: 02/19/2017 10:45 AM   Modules accepted: Orders

## 2017-02-19 NOTE — Telephone Encounter (Signed)
Patient is out of town and having UTI issues.  She was seen by GI last week and they did a urinalysis.  Advised patient she needs to go to urgent care but would make Dr [name] [name] aware of results.

## 2017-02-19 NOTE — Telephone Encounter (Signed)
We can order O2 based on the oxygen saturation scores we originally used. She will need to establish with a local physician quickly, to take over orders and documentation going forward.

## 2017-02-19 NOTE — Telephone Encounter (Signed)
We will need a new order for this pt she wants to use [facility] in [place]

## 2017-02-19 NOTE — Telephone Encounter (Signed)
CY, are you ok with [place] writing this order?   Please advise.

## 2017-02-19 NOTE — Telephone Encounter (Signed)
Macrobid sent to [facility] in [place].

## 2017-02-19 NOTE — Telephone Encounter (Signed)
Spoke with patient. She stated she is moving to [place], [place] this week and wants to continue her o2. She wants to know how to get setup with a DME company there. Advised patient that she would need a new order. Will route this to the [facility] for their advice.   PCCs, can you help with this?

## 2017-02-19 NOTE — Telephone Encounter (Signed)
Left message for patient to call office regarding her UTI issues.  Per Dr Raliegh Scarlet ok to send in [place] 100mg  bid for 5 days.  Left message for patient to let us know what pharmacy to send rx to.

## 2017-02-20 NOTE — Telephone Encounter (Signed)
Pt is aware of CY's recommendations and voiced her understanding. Order has been placed to [place], [place]. Nothing further needed.

## 2017-02-21 NOTE — Telephone Encounter (Signed)
This encounter was created in error - please disregard.

## 2017-02-22 ENCOUNTER — Telehealth: Payer: Self-pay | Admitting: Family Medicine

## 2017-02-22 ENCOUNTER — Other Ambulatory Visit: Payer: Self-pay

## 2017-02-22 ENCOUNTER — Telehealth: Payer: Self-pay

## 2017-02-22 DIAGNOSIS — Z794 Long term (current) use of insulin: Secondary | ICD-10-CM

## 2017-02-22 DIAGNOSIS — E118 Type 2 diabetes mellitus with unspecified complications: Secondary | ICD-10-CM

## 2017-02-22 DIAGNOSIS — E119 Type 2 diabetes mellitus without complications: Secondary | ICD-10-CM

## 2017-02-22 MED ORDER — GLUCOSE BLOOD VI STRP: ORAL_STRIP | 12 refills | Status: DC

## 2017-02-22 MED ORDER — ONETOUCH ULTRASOFT LANCETS MISC: 12 refills | Status: AC

## 2017-02-22 MED ORDER — RIFAXIMIN 550 MG PO TABS: 550.0000 mg | ORAL_TABLET | Freq: Three times a day (TID) | ORAL | 0 refills | Status: AC

## 2017-02-22 NOTE — Telephone Encounter (Signed)
Test strips and lancets sent to new pharmacy in [place] since patient is moving there.

## 2017-02-22 NOTE — Telephone Encounter (Signed)
Patient called and left VM stating she needs to talk to someone clinical about her diabetic testing supplies.

## 2017-02-22 NOTE — Addendum Note (Signed)
Addended by: Amado Coe on: 02/22/2017 02:03 PM   Modules accepted: Orders

## 2017-02-22 NOTE — Telephone Encounter (Signed)
-----   Message from Willia Craze, NP sent at 02/20/2017  1:46 PM EDT ----- Jamas Lav, please call her and if not better then give her a course of Xifaxan, (please see Dr. Doyne Keel comment on my office note) Thanks

## 2017-02-22 NOTE — Telephone Encounter (Signed)
The patient continues with diarrhea. She is interested in trying Xifaxan .2 week course of rifaximin 550mg  TID for 2 weeks transmitted to [facility] in [place] per patient request.

## 2017-03-07 ENCOUNTER — Telehealth: Payer: Self-pay | Admitting: Family Medicine

## 2017-03-07 ENCOUNTER — Other Ambulatory Visit: Payer: Self-pay

## 2017-03-07 DIAGNOSIS — E0865 Diabetes mellitus due to underlying condition with hyperglycemia: Secondary | ICD-10-CM

## 2017-03-07 DIAGNOSIS — Z794 Long term (current) use of insulin: Principal | ICD-10-CM

## 2017-03-07 MED ORDER — INSULIN GLARGINE 100 UNIT/ML SOLOSTAR PEN: 30.0000 [IU] | PEN_INJECTOR | Freq: Every day | SUBCUTANEOUS | 99 refills | Status: AC

## 2017-03-07 MED ORDER — LIRAGLUTIDE 18 MG/3ML ~~LOC~~ SOPN: PEN_INJECTOR | SUBCUTANEOUS | 0 refills | Status: AC

## 2017-03-07 NOTE — Telephone Encounter (Signed)
Call Pharmacy to correct the test strips needed.  MPulliam, CMA/RT(R)

## 2017-03-07 NOTE — Telephone Encounter (Signed)
Pt called states she has to One Touch Ultra diabetic meter, wrong test strips were cld into [facility] on [place], [place]-- Pt needs refill order called in for One Touch Ultra test strips-- Please call patient if questions. --glh

## 2017-03-07 NOTE — Telephone Encounter (Signed)
Patient had a missed call but couldn't state if she had a VM from the clinic staff. She would like a call back, she is sure it is in relation to her diabetic testing supplies.

## 2017-03-07 NOTE — Telephone Encounter (Signed)
Spoke to patient and notified that strips have been corrected with pharmacy. MPulliam, CMA/RT(R)

## 2017-03-12 ENCOUNTER — Other Ambulatory Visit: Payer: Self-pay | Admitting: Family Medicine

## 2017-03-12 ENCOUNTER — Telehealth: Payer: Self-pay | Admitting: Internal Medicine

## 2017-03-12 ENCOUNTER — Telehealth: Payer: Self-pay

## 2017-03-12 MED ORDER — GLUCOSE BLOOD VI STRP: ORAL_STRIP | 12 refills | Status: AC

## 2017-03-12 MED ORDER — GLUCOSE BLOOD VI STRP: ORAL_STRIP | 12 refills | Status: DC

## 2017-03-12 NOTE — Telephone Encounter (Signed)
Pt states she did get the xifaxan and it has helped a lot.

## 2017-03-12 NOTE — Telephone Encounter (Signed)
-----   Message from Willia Craze, NP sent at 03/12/2017  9:48 AM EDT ----- [name] [name], did she ever get the xifaxan and did it help?  Thanks ----- Message ----- From: Greggory Keen, LPN Sent: 03/15/5973   2:22 PM To: Willia Craze, NP  Spoke with the patient. She is still experiencing the same symptoms with diarrhea. She was given Macrobid today to treat a UTI. Can she take both antibiotics at the same time? ----- Message ----- From: Willia Craze, NP Sent: 02/20/2017   1:46 PM To: Greggory Keen, LPN  Hi Beth, please call her and if not better then give her a course of Xifaxan, (please see Dr. Doyne Keel comment on my office note) Thanks

## 2017-03-12 NOTE — Telephone Encounter (Signed)
Pt called states she has the One Touch Ultra meter not the  (One Touch Verio)-- Pt request we call into [facility] in [place] ,[place] (on [address]) a Rx for Lancets & Test Strips-- This is very Confused,unsure if this is the correct information since she has called multiple times with Incorrect information---Pt has moved out of states to [place]

## 2017-03-12 NOTE — Telephone Encounter (Signed)
Called [facility] in [place], [place]. We sent them an order on 02/20/17 for pt's oxygen. Anderson Malta states that they received the CMN back from [place] that was needed for this order. Spoke with pt. She is aware of this information. Nothing further was needed.

## 2017-03-12 NOTE — Telephone Encounter (Signed)
Test strips sent to pharmacy.  Informed patient since she has moved she needs to contact new PCP.  Patient verbalized understanding.

## 2017-03-13 ENCOUNTER — Telehealth: Payer: Self-pay | Admitting: Internal Medicine

## 2017-03-13 NOTE — Telephone Encounter (Signed)
Anderson Malta at [facility] requesting most recent OV note to be faxed to their office.  This has been sent.  Nothing further needed.

## 2017-03-14 ENCOUNTER — Telehealth: Payer: Self-pay

## 2017-03-14 DIAGNOSIS — E0800 Diabetes mellitus due to underlying condition with hyperosmolarity without nonketotic hyperglycemic-hyperosmolar coma (NKHHC): Secondary | ICD-10-CM

## 2017-03-14 DIAGNOSIS — I509 Heart failure, unspecified: Secondary | ICD-10-CM | POA: Diagnosis not present

## 2017-03-14 DIAGNOSIS — Z794 Long term (current) use of insulin: Principal | ICD-10-CM

## 2017-03-14 MED ORDER — INSULIN PEN NEEDLE 31G X 5 MM MISC: 0 refills | Status: AC

## 2017-03-14 NOTE — Telephone Encounter (Signed)
[facility] in [place] called requesting pen needle refill.  Order given.  Advised pharmacy patient needs to find new PCP since she has moved.

## 2017-03-15 ENCOUNTER — Ambulatory Visit: Payer: Medicare Other | Admitting: Internal Medicine

## 2017-03-16 DIAGNOSIS — N189 Chronic kidney disease, unspecified: Secondary | ICD-10-CM | POA: Diagnosis not present

## 2017-03-16 DIAGNOSIS — N390 Urinary tract infection, site not specified: Secondary | ICD-10-CM | POA: Diagnosis not present

## 2017-03-23 DIAGNOSIS — E1165 Type 2 diabetes mellitus with hyperglycemia: Secondary | ICD-10-CM | POA: Diagnosis not present

## 2017-03-23 DIAGNOSIS — R300 Dysuria: Secondary | ICD-10-CM | POA: Diagnosis not present

## 2017-04-08 IMAGING — DX DG CHEST 2V
2 series · 2 of 2 positions shown · non-contrast
Comparison: 07/01/2015

CLINICAL DATA: Chest pain and short of breath

EXAM:
CHEST  2 VIEW

[chest pa]
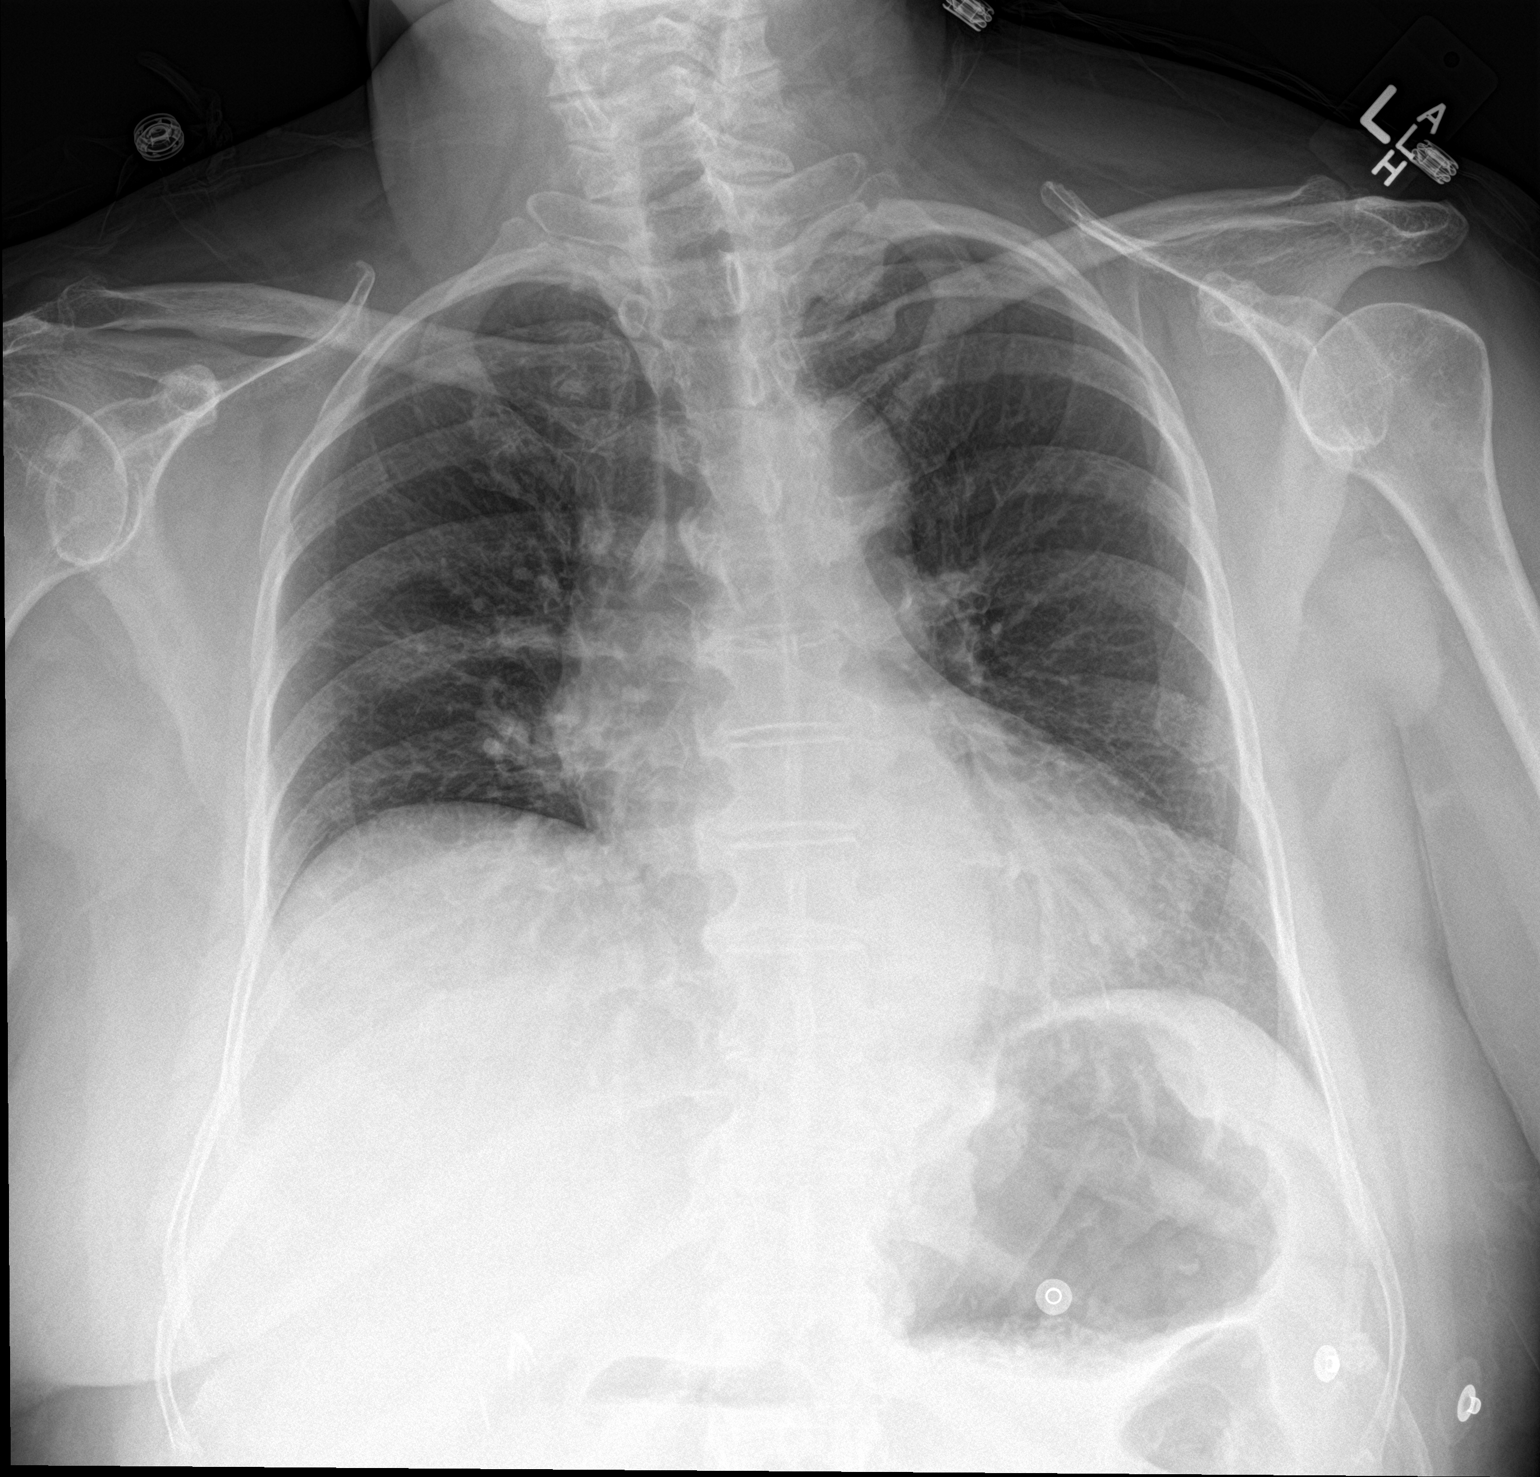

[chest lat]
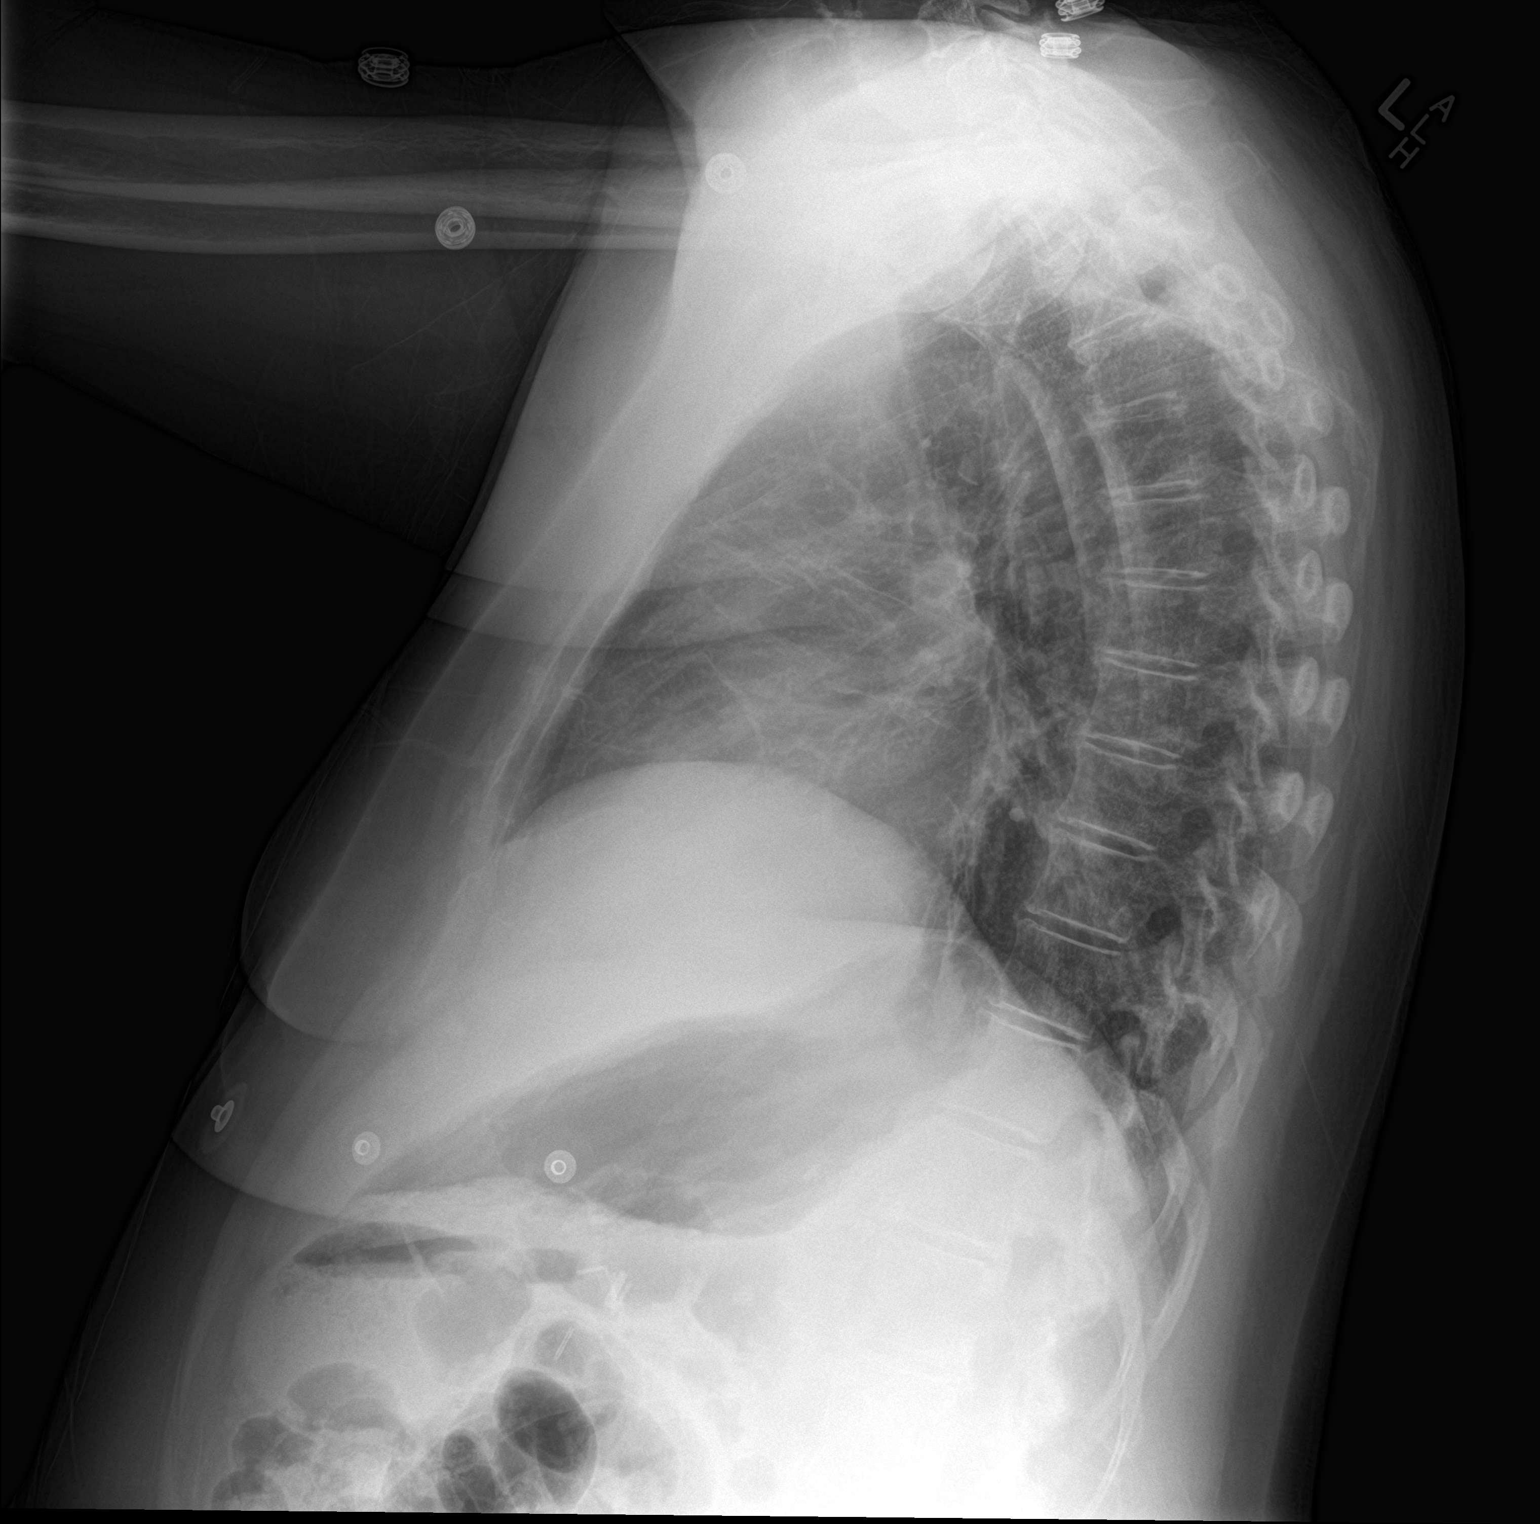

[2 of 2 positions shown; findings below may reference images not displayed]

FINDINGS: Right upper lobe scarring anteriorly. Elevated right hemidiaphragm
with right lower lobe atelectasis. Negative for heart failure.
Negative for pneumonia or effusion or mass lesion.
IMPRESSION: No active cardiopulmonary disease.

## 2017-04-19 NOTE — Addendum Note (Signed)
Addendum  created 04/19/17 0933 by Roberts Gaudy, MD   Sign clinical note

## 2017-04-27 ENCOUNTER — Ambulatory Visit: Payer: Medicare Other | Admitting: Family Medicine

## 2020-08-28 DEATH — deceased
